# Patient Record
Sex: Female | Born: 1935 | Race: Black or African American | Hispanic: No | Smoking: Never smoker
Health system: Southern US, Community
[De-identification: ages and names within clinical notes are randomized; demographics above are authoritative.]

## PROBLEM LIST (undated history)

## (undated) DIAGNOSIS — D696 Thrombocytopenia, unspecified: Secondary | ICD-10-CM

## (undated) DIAGNOSIS — R1310 Dysphagia, unspecified: Secondary | ICD-10-CM

## (undated) DIAGNOSIS — R739 Hyperglycemia, unspecified: Secondary | ICD-10-CM

## (undated) DIAGNOSIS — Q2112 Patent foramen ovale: Secondary | ICD-10-CM

## (undated) DIAGNOSIS — E2839 Other primary ovarian failure: Secondary | ICD-10-CM

## (undated) DIAGNOSIS — E041 Nontoxic single thyroid nodule: Secondary | ICD-10-CM

## (undated) DIAGNOSIS — D649 Anemia, unspecified: Secondary | ICD-10-CM

## (undated) DIAGNOSIS — N183 Chronic kidney disease, stage 3 unspecified: Secondary | ICD-10-CM

## (undated) DIAGNOSIS — R32 Unspecified urinary incontinence: Secondary | ICD-10-CM

## (undated) DIAGNOSIS — M1990 Unspecified osteoarthritis, unspecified site: Secondary | ICD-10-CM

## (undated) DIAGNOSIS — Z0000 Encounter for general adult medical examination without abnormal findings: Secondary | ICD-10-CM

## (undated) DIAGNOSIS — H409 Unspecified glaucoma: Secondary | ICD-10-CM

## (undated) DIAGNOSIS — M79675 Pain in left toe(s): Secondary | ICD-10-CM

## (undated) DIAGNOSIS — N289 Disorder of kidney and ureter, unspecified: Secondary | ICD-10-CM

## (undated) DIAGNOSIS — G459 Transient cerebral ischemic attack, unspecified: Secondary | ICD-10-CM

## (undated) DIAGNOSIS — I10 Essential (primary) hypertension: Secondary | ICD-10-CM

## (undated) DIAGNOSIS — I480 Paroxysmal atrial fibrillation: Secondary | ICD-10-CM

## (undated) DIAGNOSIS — R296 Repeated falls: Secondary | ICD-10-CM

## (undated) DIAGNOSIS — N3281 Overactive bladder: Secondary | ICD-10-CM

## (undated) DIAGNOSIS — Z8619 Personal history of other infectious and parasitic diseases: Secondary | ICD-10-CM

## (undated) DIAGNOSIS — E785 Hyperlipidemia, unspecified: Secondary | ICD-10-CM

## (undated) DIAGNOSIS — I2510 Atherosclerotic heart disease of native coronary artery without angina pectoris: Secondary | ICD-10-CM

## (undated) DIAGNOSIS — Q211 Atrial septal defect: Secondary | ICD-10-CM

## (undated) HISTORY — DX: Atrial septal defect: Q21.1

## (undated) HISTORY — DX: Disorder of kidney and ureter, unspecified: N28.9

## (undated) HISTORY — DX: Unspecified osteoarthritis, unspecified site: M19.90

## (undated) HISTORY — DX: Hyperglycemia, unspecified: R73.9

## (undated) HISTORY — DX: Essential (primary) hypertension: I10

## (undated) HISTORY — DX: Unspecified urinary incontinence: R32

## (undated) HISTORY — PX: ABDOMINAL HYSTERECTOMY: SHX81

## (undated) HISTORY — DX: Transient cerebral ischemic attack, unspecified: G45.9

## (undated) HISTORY — DX: Personal history of other infectious and parasitic diseases: Z86.19

## (undated) HISTORY — PX: EYE SURGERY: SHX253

## (undated) HISTORY — DX: Nontoxic single thyroid nodule: E04.1

## (undated) HISTORY — DX: Patent foramen ovale: Q21.12

## (undated) HISTORY — PX: APPENDECTOMY: SHX54

## (undated) HISTORY — DX: Pain in left toe(s): M79.675

## (undated) HISTORY — DX: Hyperlipidemia, unspecified: E78.5

## (undated) HISTORY — DX: Encounter for general adult medical examination without abnormal findings: Z00.00

## (undated) HISTORY — PX: JOINT REPLACEMENT: SHX530

## (undated) HISTORY — DX: Other primary ovarian failure: E28.39

---

## 2005-05-17 HISTORY — PX: TRIGGER FINGER RELEASE: SHX641

## 2005-05-17 HISTORY — PX: REPLACEMENT TOTAL KNEE: SUR1224

## 2013-04-29 DIAGNOSIS — R55 Syncope and collapse: Secondary | ICD-10-CM | POA: Insufficient documentation

## 2014-02-19 ENCOUNTER — Ambulatory Visit (INDEPENDENT_AMBULATORY_CARE_PROVIDER_SITE_OTHER): Payer: Medicare Other | Admitting: Internal Medicine

## 2014-02-19 ENCOUNTER — Encounter: Payer: Self-pay | Admitting: Internal Medicine

## 2014-02-19 ENCOUNTER — Telehealth: Payer: Self-pay | Admitting: *Deleted

## 2014-02-19 ENCOUNTER — Encounter: Payer: Self-pay | Admitting: *Deleted

## 2014-02-19 VITALS — BP 150/70 | HR 63 | Temp 98.0°F | Resp 15 | Ht 60.0 in | Wt 139.0 lb

## 2014-02-19 DIAGNOSIS — R7989 Other specified abnormal findings of blood chemistry: Secondary | ICD-10-CM

## 2014-02-19 DIAGNOSIS — R748 Abnormal levels of other serum enzymes: Secondary | ICD-10-CM

## 2014-02-19 DIAGNOSIS — R32 Unspecified urinary incontinence: Secondary | ICD-10-CM | POA: Insufficient documentation

## 2014-02-19 DIAGNOSIS — Z23 Encounter for immunization: Secondary | ICD-10-CM

## 2014-02-19 DIAGNOSIS — M1990 Unspecified osteoarthritis, unspecified site: Secondary | ICD-10-CM | POA: Insufficient documentation

## 2014-02-19 DIAGNOSIS — M159 Polyosteoarthritis, unspecified: Secondary | ICD-10-CM

## 2014-02-19 DIAGNOSIS — M150 Primary generalized (osteo)arthritis: Secondary | ICD-10-CM

## 2014-02-19 DIAGNOSIS — D538 Other specified nutritional anemias: Secondary | ICD-10-CM

## 2014-02-19 DIAGNOSIS — I10 Essential (primary) hypertension: Secondary | ICD-10-CM

## 2014-02-19 DIAGNOSIS — E042 Nontoxic multinodular goiter: Secondary | ICD-10-CM

## 2014-02-19 DIAGNOSIS — E785 Hyperlipidemia, unspecified: Secondary | ICD-10-CM

## 2014-02-19 DIAGNOSIS — Z90710 Acquired absence of both cervix and uterus: Secondary | ICD-10-CM

## 2014-02-19 DIAGNOSIS — N3946 Mixed incontinence: Secondary | ICD-10-CM

## 2014-02-19 DIAGNOSIS — D649 Anemia, unspecified: Secondary | ICD-10-CM | POA: Insufficient documentation

## 2014-02-19 LAB — COMPLETE METABOLIC PANEL WITH GFR
ALBUMIN: 4.2 g/dL (ref 3.5–5.2)
ALT: 23 U/L (ref 0–35)
AST: 29 U/L (ref 0–37)
Alkaline Phosphatase: 59 U/L (ref 39–117)
BUN: 18 mg/dL (ref 6–23)
CALCIUM: 9.6 mg/dL (ref 8.4–10.5)
CHLORIDE: 102 meq/L (ref 96–112)
CO2: 21 mEq/L (ref 19–32)
Creat: 1.15 mg/dL — ABNORMAL HIGH (ref 0.50–1.10)
GFR, EST AFRICAN AMERICAN: 50 mL/min — AB
GFR, EST NON AFRICAN AMERICAN: 43 mL/min — AB
GLUCOSE: 93 mg/dL (ref 70–99)
POTASSIUM: 4.5 meq/L (ref 3.5–5.3)
Sodium: 137 mEq/L (ref 135–145)
Total Bilirubin: 0.6 mg/dL (ref 0.2–1.2)
Total Protein: 7.1 g/dL (ref 6.0–8.3)

## 2014-02-19 LAB — LIPID PANEL
CHOLESTEROL: 140 mg/dL (ref 0–200)
HDL: 81 mg/dL (ref >39)
LDL Cholesterol: 41 mg/dL (ref 0–99)
Total CHOL/HDL Ratio: 1.7 Ratio
Triglycerides: 91 mg/dL (ref <150)
VLDL: 18 mg/dL (ref 0–40)

## 2014-02-19 MED ORDER — CARVEDILOL 3.125 MG PO TABS: 3.1250 mg | ORAL_TABLET | Freq: Two times a day (BID) | ORAL | Status: DC

## 2014-02-19 NOTE — Progress Notes (Signed)
Subjective:    Patient ID: Holly Nunez, female    DOB: 1924/10/14, 80 y.o.   MRN: [id number]  HPI  Holly Nunez is a new pt here for first visit primary care  She has been in [place] for 4 months now from [place].   She lived there with with her son.  Holly Nunez grew up in [place].    She is now living with her daughter who is also a pt of mine  PMH of HTN, hyperlipidemia,   CAD,  DJD, thyroid nodule,  Elevated creatinine, urinary incontinence (formerly on Ditropan)    Mild anemia  She is S/P hysterectomy.  For benign reasons.   And S/P  R TKR  She does report she is nearly out of her BP pills See repeat BP.  She checks as outpt and tells her SBP is always normal   She had been on ditropan in the past for incontinence but is not now  .  She tells me she is "up several times at night"    No recent chest pain or pressure.   Pt tells me she had a nodule in her thyroid that was biopsied last year and pt told it was benign   Daughter Holly Nunez tells me she thinks pt may have had TIa's in past.  Daughter and pt not at all clear what her cardiac history is.   Allergies not on file No past medical history on file. No past surgical history on file. History   Social History  . Marital Status: N/A    Spouse Name: N/A    Number of Children: N/A  . Years of Education: N/A   Occupational History  . Not on file.   Social History Main Topics  . Smoking status: Not on file  . Smokeless tobacco: Not on file  . Alcohol Use: Not on file  . Drug Use: Not on file  . Sexual Activity: Not on file   Other Topics Concern  . Not on file   Social History Narrative  . No narrative on file   No family history on file. There are no active problems to display for this patient.  No current outpatient prescriptions on file prior to visit.   No current facility-administered medications on file prior to visit.      Review of Systems See HPI    Objective:   Physical Exam  Physical Exam   Nursing note and vitals reviewed.  Constitutional: She is oriented to person, place, and time. She appears well-developed and well-nourished.  HENT:  Head: Normocephalic and atraumatic.  Cardiovascular: Normal rate and regular rhythm. Exam reveals no gallop and no friction rub.  No murmur heard.  Pulmonary/Chest: Breath sounds normal. She has no wheezes. She has no rales.  Neurological: She is alert and oriented to person, place, and time.  Skin: Skin is warm and dry.  Psychiatric: She has a normal mood and affect. Her behavior is normal.         Assessment & Plan:  HTN:  Repeat BP much lower will RX coreg bid  See me in 2 weeks  Hyperlipidemia  Check today   Elevataed creatinine  Check today  MNG S/P benign bx per pt report   Check TSH today   CAD  Will need prior records   She wishes to establish with a new cardiologist here will refer to Dr. Stanford Breed  Mixed incontinence   Formerly on Ditropan but will need to check creatinine  before any RX  Advanced DJD  S/P Right TKR Flu vaccine given today   See me in two weeks

## 2014-02-19 NOTE — Patient Instructions (Signed)
See me 30 mins two weeks   To lab today

## 2014-02-19 NOTE — Telephone Encounter (Signed)
I spoke with Ms. Diffee's daughter and gave the appointment information for Dr. Stanford Breed. -eh

## 2014-02-20 LAB — CBC WITH DIFFERENTIAL/PLATELET
Basophils Absolute: 0.1 10*3/uL (ref 0.0–0.1)
Basophils Relative: 1 % (ref 0–1)
Eosinophils Absolute: 0.1 10*3/uL (ref 0.0–0.7)
Eosinophils Relative: 2 % (ref 0–5)
HEMATOCRIT: 36.2 % (ref 36.0–46.0)
Hemoglobin: 12.5 g/dL (ref 12.0–15.0)
LYMPHS PCT: 29 % (ref 12–46)
Lymphs Abs: 1.7 10*3/uL (ref 0.7–4.0)
MCH: 31.4 pg (ref 26.0–34.0)
MCHC: 34.5 g/dL (ref 30.0–36.0)
MCV: 91 fL (ref 78.0–100.0)
MONO ABS: 0.4 10*3/uL (ref 0.1–1.0)
Monocytes Relative: 6 % (ref 3–12)
NEUTROS ABS: 3.7 10*3/uL (ref 1.7–7.7)
Neutrophils Relative %: 62 % (ref 43–77)
Platelets: 209 10*3/uL (ref 150–400)
RBC: 3.98 MIL/uL (ref 3.87–5.11)
RDW: 14.4 % (ref 11.5–15.5)
WBC: 5.9 10*3/uL (ref 4.0–10.5)

## 2014-02-20 LAB — VITAMIN D 25 HYDROXY (VIT D DEFICIENCY, FRACTURES): Vit D, 25-Hydroxy: 35 ng/mL (ref 30–89)

## 2014-02-20 LAB — TSH: TSH: 0.771 u[IU]/mL (ref 0.350–4.500)

## 2014-02-25 NOTE — Progress Notes (Signed)
I spoke with Holly Nunez and her daughter about lab results. Advised that she keep her appointment with [place] next week. I also mailed her a copy of the labs-eh

## 2014-03-07 ENCOUNTER — Ambulatory Visit (INDEPENDENT_AMBULATORY_CARE_PROVIDER_SITE_OTHER): Payer: Medicare Other | Admitting: Internal Medicine

## 2014-03-07 ENCOUNTER — Encounter: Payer: Self-pay | Admitting: Internal Medicine

## 2014-03-07 VITALS — BP 148/70 | HR 68 | Temp 98.1°F | Resp 16 | Ht 60.0 in | Wt 138.0 lb

## 2014-03-07 DIAGNOSIS — N3946 Mixed incontinence: Secondary | ICD-10-CM

## 2014-03-07 DIAGNOSIS — I10 Essential (primary) hypertension: Secondary | ICD-10-CM

## 2014-03-07 DIAGNOSIS — N289 Disorder of kidney and ureter, unspecified: Secondary | ICD-10-CM | POA: Insufficient documentation

## 2014-03-07 DIAGNOSIS — E785 Hyperlipidemia, unspecified: Secondary | ICD-10-CM

## 2014-03-07 DIAGNOSIS — E042 Nontoxic multinodular goiter: Secondary | ICD-10-CM

## 2014-03-07 MED ORDER — OXYBUTYNIN CHLORIDE ER 5 MG PO TB24: ORAL_TABLET | ORAL | Status: DC

## 2014-03-07 NOTE — Progress Notes (Signed)
Subjective:    Patient ID: Holly Nunez, female    DOB: December 12, 1924, 80 y.o.   MRN: [id number]  HPI 01/2014 note HTN: Repeat BP much lower will RX coreg bid See me in 2 weeks  Hyperlipidemia Check today  Elevataed creatinine Check today  MNG S/P benign bx per pt report Check TSH today  CAD Will need prior records She wishes to establish with a new cardiologist here will refer to Dr. Stanford Breed  Mixed incontinence Formerly on Ditropan but will need to check creatinine before any RX  Advanced DJD S/P Right TKR  Flu vaccine given today  See m e in two weeks  Today  Armina is here with her daughter who works for [employer].    Renal insufficiency  See current labs .  She is not on any diuretics at this time.  Risk factor long standing HTN   I do not have prior values  She has mixed incontinence and would like the Ditropan she had been on in the past.   Daughter reports pt has fallen in the past and may benefit from Home pt and pt is homebound.  .  She currently ambulates with a walker    Not on File Past Medical History  Diagnosis Date  . Arthritis   . Hypertension   . Hyperlipidemia   . Thyroid disease   . Urinary incontinence   . Heart disease    Past Surgical History  Procedure Laterality Date  . Abdominal hysterectomy    . Joint replacement    . Replacement total knee Right 2007  . Trigger finger release  2007   History   Social History  . Marital Status: Single    Spouse Name: N/A    Number of Children: N/A  . Years of Education: N/A   Occupational History  . Not on file.   Social History Main Topics  . Smoking status: Never Smoker   . Smokeless tobacco: Never Used  . Alcohol Use: No  . Drug Use: No  . Sexual Activity: Not Currently    Partners: Male   Other Topics Concern  . Not on file   Social History Narrative  . No narrative on file   Family History  Problem Relation Age of Onset  . Heart disease Mother   . Kidney disease Sister    . Cancer Sister   . Bone cancer Sister   . Pancreatic cancer Daughter    Patient Active Problem List   Diagnosis Date Noted  . HTN (hypertension) 02/19/2014  . Hyperlipidemia 02/19/2014  . DJD (degenerative joint disease) 02/19/2014  . S/P hysterectomy 02/19/2014  . Multinodular goiter 02/19/2014  . Elevated serum creatinine 02/19/2014  . Anemia 02/19/2014  . Urinary incontinence 02/19/2014   Current Outpatient Prescriptions on File Prior to Visit  Medication Sig Dispense Refill  . apixaban (ELIQUIS) 2.5 MG TABS tablet Take 2.5 mg by mouth 2 (two) times daily.      Marland Kitchen atorvastatin (LIPITOR) 40 MG tablet Take 40 mg by mouth daily.      . brimonidine (ALPHAGAN) 0.15 % ophthalmic solution Place 1 drop into both eyes 3 (three) times daily.      . carvedilol (COREG) 3.125 MG tablet Take 1 tablet (3.125 mg total) by mouth 2 (two) times daily with a meal.  60 tablet  1  . latanoprost (XALATAN) 0.005 % ophthalmic solution Place 1 drop into both eyes at bedtime.      . nitroGLYCERIN (NITROLINGUAL)  0.4 MG/SPRAY spray Place 1 spray under the tongue every 5 (five) minutes x 3 doses as needed for chest pain.       No current facility-administered medications on file prior to visit.       Review of Systems    see HPI Objective:   Physical Exam  Physical Exam  Nursing note and vitals reviewed.  Constitutional: She is oriented to person, place, and time. She appears well-developed and well-nourished.  HENT:  Head: Normocephalic and atraumatic.  Cardiovascular: Normal rate and regular rhythm. Exam reveals no gallop and no friction rub.  No murmur heard.  Pulmonary/Chest: Breath sounds normal. She has no wheezes. She has no rales.  Neurological: She is alert and oriented to person, place, and time.  Skin: Skin is warm and dry.  Psychiatric: She has a normal mood and affect. Her behavior is normal.        Assessment & Plan:  HTN:  Continue current meds.  She does have an element of  white coat syndrome  Renal insufficiency.  Will watch closely.  Awaiting old records for comparison.   Mixed urinary incontinence  Pt and daughter give no history of glaucoma.  She had been on Ditropan bid in past.  Ok to give Ditropan just once a day   MNG  TSH NOrmal  Prior falls  Will have home asessment with [facility].   PT to do home visits. ADvised grab bars and pt to use walker at all times.   Hyperlipidemia  Continue Lipitor  lfts good  CAD  She has upcoming appt with Dr. Stanford Breed    Follow up with me  In December

## 2014-03-13 ENCOUNTER — Other Ambulatory Visit: Payer: Self-pay | Admitting: *Deleted

## 2014-03-13 NOTE — Telephone Encounter (Signed)
Patient is requesting a one time refill til she sees Dr. Stanford Breed. -eh

## 2014-03-14 MED ORDER — APIXABAN 2.5 MG PO TABS: 2.5000 mg | ORAL_TABLET | Freq: Two times a day (BID) | ORAL | Status: DC

## 2014-03-14 NOTE — Telephone Encounter (Signed)
[name] Daughter [phone]  [name] called back to see if we were going to be able to fill [name] apixaban (ELIQUIS) 2.5 MG TABS tablet, she needs at least 5 pills to get her thru until she sees Dr Stanford Breed on 03/20/14

## 2014-03-14 NOTE — Telephone Encounter (Signed)
Holly Nunez  Call pt and let her know that I will refill her Eliquis for one month .   She will meet Dr. Stanford Breed on 11/4 and the cardiologist will need to refill this med moving forward from there

## 2014-03-14 NOTE — Telephone Encounter (Signed)
I let Holly Nunez know that we called in her Eliquis this one time to hold her till she gets in with the cardiologist. -eh

## 2014-03-20 ENCOUNTER — Ambulatory Visit (INDEPENDENT_AMBULATORY_CARE_PROVIDER_SITE_OTHER): Payer: Medicare Other | Admitting: Cardiology

## 2014-03-20 ENCOUNTER — Encounter: Payer: Self-pay | Admitting: Cardiology

## 2014-03-20 VITALS — BP 156/72 | HR 64 | Ht 61.0 in | Wt 143.0 lb

## 2014-03-20 DIAGNOSIS — G459 Transient cerebral ischemic attack, unspecified: Secondary | ICD-10-CM | POA: Insufficient documentation

## 2014-03-20 DIAGNOSIS — I10 Essential (primary) hypertension: Secondary | ICD-10-CM

## 2014-03-20 DIAGNOSIS — E785 Hyperlipidemia, unspecified: Secondary | ICD-10-CM

## 2014-03-20 DIAGNOSIS — G458 Other transient cerebral ischemic attacks and related syndromes: Secondary | ICD-10-CM

## 2014-03-20 NOTE — Assessment & Plan Note (Signed)
Patient has a history of patent foramen ovale with TIAs by her report. She is presently on apixaban. I will continue for now. We will obtain all previous records for review.

## 2014-03-20 NOTE — Assessment & Plan Note (Signed)
Continue statin. Lipids and liver monitored by primary care. 

## 2014-03-20 NOTE — Patient Instructions (Signed)
Your physician recommends that you schedule a follow-up appointment in: [place]

## 2014-03-20 NOTE — Progress Notes (Signed)
HPI: 80 year old female for evaluation of patent foramen ovale and prior TIA. Patient previously lived in [place]. I do not have her previous medical records available. She does have a paper saying that she has a patent foramen ovale. She has a history of TIAs but no other cardiac history. She has been treated with apixaban. She denies dyspnea, chest pain, syncope or bleeding. Occasional brief flutters but no sustained palpitations. She does not recall the term atrial fibrillation.  Current Outpatient Prescriptions  Medication Sig Dispense Refill  . apixaban (ELIQUIS) 2.5 MG TABS tablet Take 1 tablet (2.5 mg total) by mouth 2 (two) times daily. 60 tablet 0  . atorvastatin (LIPITOR) 40 MG tablet Take 40 mg by mouth daily.    . brimonidine (ALPHAGAN) 0.15 % ophthalmic solution Place 1 drop into both eyes 3 (three) times daily.    . carvedilol (COREG) 3.125 MG tablet Take 1 tablet (3.125 mg total) by mouth 2 (two) times daily with a meal. 60 tablet 1  . latanoprost (XALATAN) 0.005 % ophthalmic solution Place 1 drop into both eyes at bedtime.    . nitroGLYCERIN (NITROLINGUAL) 0.4 MG/SPRAY spray Place 1 spray under the tongue every 5 (five) minutes x 3 doses as needed for chest pain.    Marland Kitchen oxybutynin (DITROPAN XL) 5 MG 24 hr tablet Take one daily 30 tablet 1   No current facility-administered medications for this visit.    No Known Allergies   Past Medical History  Diagnosis Date  . Arthritis   . Hypertension   . Hyperlipidemia   . Thyroid nodule     Biopsy-negative  . Urinary incontinence   . Renal insufficiency   . Patent foramen ovale   . TIA (transient ischemic attack)     Past Surgical History  Procedure Laterality Date  . Abdominal hysterectomy    . Joint replacement    . Replacement total knee Right 2007  . Trigger finger release  2007  . Appendectomy      History   Social History  . Marital Status: Single    Spouse Name: N/A    Number of Children: 4  . Years  of Education: N/A   Occupational History  . Not on file.   Social History Main Topics  . Smoking status: Never Smoker   . Smokeless tobacco: Never Used  . Alcohol Use: No  . Drug Use: No  . Sexual Activity:    Partners: Male   Other Topics Concern  . Not on file   Social History Narrative    Family History  Problem Relation Age of Onset  . Heart disease Mother   . Kidney disease Sister   . Cancer Sister   . Bone cancer Sister   . Pancreatic cancer Daughter     ROS: no fevers or chills, productive cough, hemoptysis, dysphasia, odynophagia, melena, hematochezia, dysuria, hematuria, rash, seizure activity, orthopnea, PND, pedal edema, claudication. Remaining systems are negative.  Physical Exam:   Blood pressure 156/72, pulse 64, height 5\' 1"  (1.549 m), weight 143 lb (64.864 kg).  General:  Well developed/well nourished in NAD Skin warm/dry Patient not depressed No peripheral clubbing Back-normal HEENT-normal/normal eyelids Neck supple/normal carotid upstroke bilaterally; no bruits; no JVD; no thyromegaly chest - CTA/ normal expansion CV - RRR/normal S1 and S2; no murmurs, rubs or gallops;  PMI nondisplaced Abdomen -NT/ND, no HSM, no mass, + bowel sounds, no bruit 2+ femoral pulses, no bruits Ext-no edema, chords, 2+ DP Neuro-grossly nonfocal  ECG Sinus rhythm at a rate of 61. No ST changes.

## 2014-03-20 NOTE — Assessment & Plan Note (Signed)
Blood pressure is mildly elevated. I have asked her to follow this and we will increase medications as needed. She states her systolic typically runs in the 120 range.

## 2014-03-21 ENCOUNTER — Telehealth: Payer: Self-pay | Admitting: Cardiology

## 2014-03-21 NOTE — Telephone Encounter (Signed)
Release on [facility] (Dr Stanford Breed) faxed to Dr Cherrie Distance for all records.  Faxed 03/21/14 lp

## 2014-03-25 ENCOUNTER — Telehealth: Payer: Self-pay

## 2014-03-25 NOTE — Telephone Encounter (Signed)
I spoke with Patient, she does not have any CV SX. We will see her tomorrow in the office. She was advised to double her BP medication tomorrow per Dr .Shirline Frees

## 2014-03-25 NOTE — Telephone Encounter (Signed)
Holly Nunez Daughter  Adryan called to say that mom's blood pressure has been running high, the other daughter that is a nurse started taking it on Friday  Friday  6:30am  174/80         8:15pm   154/70 Sat 9:00am   170/90 Sun 8:00am 180/90 Mon 6:45am 180/88  [name] cell [phone]

## 2014-03-25 NOTE — Telephone Encounter (Signed)
Margaretha Sheffield call to check on pt.  She is on schedule to see me tommorow.  ADvise her to take an extra Coreg with her next dose .  Any headache or CVA symptoms go to ER

## 2014-03-26 ENCOUNTER — Encounter: Payer: Self-pay | Admitting: Internal Medicine

## 2014-03-26 ENCOUNTER — Ambulatory Visit (INDEPENDENT_AMBULATORY_CARE_PROVIDER_SITE_OTHER): Payer: Medicare Other | Admitting: Internal Medicine

## 2014-03-26 VITALS — BP 182/74 | HR 65 | Temp 98.1°F | Resp 16 | Wt 141.0 lb

## 2014-03-26 DIAGNOSIS — M159 Polyosteoarthritis, unspecified: Secondary | ICD-10-CM

## 2014-03-26 DIAGNOSIS — I10 Essential (primary) hypertension: Secondary | ICD-10-CM

## 2014-03-26 DIAGNOSIS — M150 Primary generalized (osteo)arthritis: Secondary | ICD-10-CM | POA: Diagnosis not present

## 2014-03-26 DIAGNOSIS — G459 Transient cerebral ischemic attack, unspecified: Secondary | ICD-10-CM | POA: Diagnosis not present

## 2014-03-26 MED ORDER — CARVEDILOL 3.125 MG PO TABS: ORAL_TABLET | ORAL | Status: DC

## 2014-03-26 NOTE — Progress Notes (Signed)
Subjective:    Patient ID: Holly Nunez, female    DOB: May 29, 1924, 80 y.o.   MRN: [id number]  HPI 03/20/2014 cardiology note HTN (hypertension) - Lelon Perla, MD at 03/20/2014 11:33 AM     Status: Written Related Problem: HTN (hypertension)   Expand All Collapse All   Blood pressure is mildly elevated. I have asked her to follow this and we will increase medications as needed. She states her systolic typically runs in the 120 range.            Hyperlipidemia - Lelon Perla, MD at 03/20/2014 11:34 AM     Status: Written Related Problem: Hyperlipidemia   Expand All Collapse All   Continue statin.Lipids and liver monitored by primary care.            TIA (transient ischemic attack) - Lelon Perla, MD at 03/20/2014 11:35 AM     Status: Written Related Problem: TIA (transient ischemic attack)   Expand All Collapse All   Patient has a history of patent foramen ovale with TIAs by her report. She is presently on apixaban. I will continue for now. We will obtain all previous records for review.             Today's note  Holly Nunez is here with her daughter .  BP running high at home  SBP 170-190    Pt completey asymptomatic   No chest pain no SOB   She does have pain in both knees from DJD  She is S/P  R TKR with revision.  She uses two Tylenol ES  Bid    No Known Allergies Past Medical History  Diagnosis Date  . Arthritis   . Hypertension   . Hyperlipidemia   . Thyroid nodule     Biopsy-negative  . Urinary incontinence   . Renal insufficiency   . Patent foramen ovale   . TIA (transient ischemic attack)    Past Surgical History  Procedure Laterality Date  . Abdominal hysterectomy    . Joint replacement    . Replacement total knee Right 2007  . Trigger finger release  2007  . Appendectomy     History   Social History  . Marital Status: Single    Spouse Name: N/A    Number of Children: 4  . Years of Education: N/A   Occupational  History  . Not on file.   Social History Main Topics  . Smoking status: Never Smoker   . Smokeless tobacco: Never Used  . Alcohol Use: No  . Drug Use: No  . Sexual Activity:    Partners: Male   Other Topics Concern  . Not on file   Social History Narrative   Family History  Problem Relation Age of Onset  . Heart disease Mother   . Kidney disease Sister   . Cancer Sister   . Bone cancer Sister   . Pancreatic cancer Daughter    Patient Active Problem List   Diagnosis Date Noted  . TIA (transient ischemic attack) 03/20/2014  . Renal insufficiency 03/07/2014  . HTN (hypertension) 02/19/2014  . Hyperlipidemia 02/19/2014  . DJD (degenerative joint disease) 02/19/2014  . S/P hysterectomy 02/19/2014  . Multinodular goiter 02/19/2014  . Elevated serum creatinine 02/19/2014  . Anemia 02/19/2014  . Urinary incontinence 02/19/2014   Current Outpatient Prescriptions on File Prior to Visit  Medication Sig Dispense Refill  . apixaban (ELIQUIS) 2.5 MG TABS tablet Take 1 tablet (2.5 mg total) by  mouth 2 (two) times daily. 60 tablet 0  . atorvastatin (LIPITOR) 40 MG tablet Take 40 mg by mouth daily.    . brimonidine (ALPHAGAN) 0.15 % ophthalmic solution Place 1 drop into both eyes 3 (three) times daily.    Marland Kitchen latanoprost (XALATAN) 0.005 % ophthalmic solution Place 1 drop into both eyes at bedtime.    . nitroGLYCERIN (NITROLINGUAL) 0.4 MG/SPRAY spray Place 1 spray under the tongue every 5 (five) minutes x 3 doses as needed for chest pain.    Marland Kitchen oxybutynin (DITROPAN XL) 5 MG 24 hr tablet Take one daily 30 tablet 1   No current facility-administered medications on file prior to visit.       Review of Systems    see HPI Objective:   Physical Exam  Physical Exam  Nursing note and vitals reviewed.   Repeat BP  162/80 R arm.  L arm 164/82 Constitutional: She is oriented to person, place, and time. She appears well-developed and well-nourished.  HENT:  Head: Normocephalic and  atraumatic.  Cardiovascular: Normal rate and regular rhythm. Exam reveals no gallop and no friction rub.  No murmur heard.  Pulmonary/Chest: Breath sounds normal. She has no wheezes. She has no rales.  Neurological: She is alert and oriented to person, place, and time.  Skin: Skin is warm and dry.  Psychiatric: She has a normal mood and affect. Her behavior is normal.         Assessment & Plan:  HTN:  Will increase Coreg 3.125 to two tabs bid.    Advanced  DJD :  Pt does not wish any change in meds or referral at this time.       PFO /TIA  :  Pending anticoagulant choice per cardiology   See me in 2 weeks or sooner prn

## 2014-03-26 NOTE — Patient Instructions (Signed)
See me on WEds 11/25

## 2014-04-09 ENCOUNTER — Telehealth: Payer: Self-pay | Admitting: Cardiology

## 2014-04-09 DIAGNOSIS — N289 Disorder of kidney and ureter, unspecified: Secondary | ICD-10-CM

## 2014-04-09 MED ORDER — RIVAROXABAN 15 MG PO TABS: 15.0000 mg | ORAL_TABLET | Freq: Two times a day (BID) | ORAL | Status: DC

## 2014-04-09 NOTE — Telephone Encounter (Signed)
Spoke with pt dtr, new script sent to the pharm. Lab orders mailed to patient for blood to be drawn in [place].

## 2014-04-09 NOTE — Telephone Encounter (Signed)
Need records from [facility], dc apixaban, xarelto 15 mg daily, cbc and bmet 4 weeks Holly Nunez

## 2014-04-09 NOTE — Telephone Encounter (Signed)
Spoke with pt dtr, patients insurance will cover xarelto not eliquis, she would like to switch. Will forward for dr Stanford Breed review for dosing

## 2014-04-09 NOTE — Telephone Encounter (Signed)
Please call,concerning her Eliquis and her Xarelto.

## 2014-04-10 ENCOUNTER — Encounter: Payer: Self-pay | Admitting: Internal Medicine

## 2014-04-10 ENCOUNTER — Other Ambulatory Visit: Payer: Self-pay | Admitting: *Deleted

## 2014-04-10 ENCOUNTER — Ambulatory Visit (INDEPENDENT_AMBULATORY_CARE_PROVIDER_SITE_OTHER): Payer: Medicare Other | Admitting: Internal Medicine

## 2014-04-10 VITALS — BP 177/71 | HR 59 | Temp 98.3°F | Resp 16 | Ht 60.0 in | Wt 145.0 lb

## 2014-04-10 DIAGNOSIS — I10 Essential (primary) hypertension: Secondary | ICD-10-CM

## 2014-04-10 MED ORDER — TRIAMTERENE-HCTZ 37.5-25 MG PO TABS: ORAL_TABLET | ORAL | Status: DC

## 2014-04-10 MED ORDER — RIVAROXABAN 15 MG PO TABS: 15.0000 mg | ORAL_TABLET | Freq: Every day | ORAL | Status: DC

## 2014-04-10 NOTE — Patient Instructions (Signed)
Take 1/2 of Maxzide diuretic daily   See me in early January  Call if needed

## 2014-04-10 NOTE — Progress Notes (Signed)
Subjective:    Patient ID: Holly Nunez, female    DOB: May 15, 1925, 80 y.o.   MRN: [id number]  HPI  03/26/2014 note HTN: Will increase Coreg 3.125 to two tabs bid.   Advanced DJD : Pt does not wish any change in meds or referral at this time.   PFO /TIA : Pending anticoagulant choice per cardiology   See me in 2 weeks or sooner prn          TODAY:  Hector has increased her Coreg to 3.125  Two bid.  Tolerating fine  SBP at home 160-170.  Pt admits to eating a lot of salt and pork.  Daughther confirms she is salting food even before tasting     No Known Allergies Past Medical History  Diagnosis Date  . Arthritis   . Hypertension   . Hyperlipidemia   . Thyroid nodule     Biopsy-negative  . Urinary incontinence   . Renal insufficiency   . Patent foramen ovale   . TIA (transient ischemic attack)    Past Surgical History  Procedure Laterality Date  . Abdominal hysterectomy    . Joint replacement    . Replacement total knee Right 2007  . Trigger finger release  2007  . Appendectomy     History   Social History  . Marital Status: Single    Spouse Name: N/A    Number of Children: 4  . Years of Education: N/A   Occupational History  . Not on file.   Social History Main Topics  . Smoking status: Never Smoker   . Smokeless tobacco: Never Used  . Alcohol Use: No  . Drug Use: No  . Sexual Activity:    Partners: Male   Other Topics Concern  . Not on file   Social History Narrative   Family History  Problem Relation Age of Onset  . Heart disease Mother   . Kidney disease Sister   . Cancer Sister   . Bone cancer Sister   . Pancreatic cancer Daughter    Patient Active Problem List   Diagnosis Date Noted  . TIA (transient ischemic attack) 03/20/2014  . Renal insufficiency 03/07/2014  . HTN (hypertension) 02/19/2014  . Hyperlipidemia 02/19/2014  . DJD (degenerative joint disease) 02/19/2014  . S/P hysterectomy 02/19/2014  .  Multinodular goiter 02/19/2014  . Elevated serum creatinine 02/19/2014  . Anemia 02/19/2014  . Urinary incontinence 02/19/2014   Current Outpatient Prescriptions on File Prior to Visit  Medication Sig Dispense Refill  . atorvastatin (LIPITOR) 40 MG tablet Take 40 mg by mouth daily.    . brimonidine (ALPHAGAN) 0.15 % ophthalmic solution Place 1 drop into both eyes 3 (three) times daily.    . carvedilol (COREG) 3.125 MG tablet Take 2 tablets bid 120 tablet 1  . latanoprost (XALATAN) 0.005 % ophthalmic solution Place 1 drop into both eyes at bedtime.    . nitroGLYCERIN (NITROLINGUAL) 0.4 MG/SPRAY spray Place 1 spray under the tongue every 5 (five) minutes x 3 doses as needed for chest pain.    Marland Kitchen oxybutynin (DITROPAN XL) 5 MG 24 hr tablet Take one daily 30 tablet 1  . Rivaroxaban (XARELTO) 15 MG TABS tablet Take 1 tablet (15 mg total) by mouth 2 (two) times daily with a meal. 30 tablet 6   No current facility-administered medications on file prior to visit.      Review of Systems    see HPI Objective:   Physical Exam Physical Exam  Nursing note and vitals reviewed.   Repeat Bp 168/70 Constitutional: She is oriented to person, place, and time. She appears well-developed and well-nourished.  HENT:  Head: Normocephalic and atraumatic.  Cardiovascular: Normal rate and regular rhythm. Exam reveals no gallop and no friction rub.  No murmur heard.  Pulmonary/Chest: Breath sounds normal. She has no wheezes. She has no rales.  Neurological: She is alert and oriented to person, place, and time.  Skin: Skin is warm and dry.  Psychiatric: She has a normal mood and affect. Her behavior is normal.              Assessment & Plan:  ISH  Not at goal  Advised to get [name] salt substitue.  Will add low dose Maxzide  37.5/25   1/2 tablet daily   See me in January

## 2014-04-16 ENCOUNTER — Other Ambulatory Visit: Payer: Self-pay | Admitting: *Deleted

## 2014-04-16 MED ORDER — OXYBUTYNIN CHLORIDE ER 5 MG PO TB24: ORAL_TABLET | ORAL | Status: DC

## 2014-04-16 NOTE — Telephone Encounter (Signed)
Refill request

## 2014-04-17 ENCOUNTER — Ambulatory Visit: Payer: Medicare Other | Admitting: Internal Medicine

## 2014-04-17 ENCOUNTER — Other Ambulatory Visit: Payer: Self-pay | Admitting: *Deleted

## 2014-04-17 MED ORDER — OXYBUTYNIN CHLORIDE ER 5 MG PO TB24: ORAL_TABLET | ORAL | Status: DC

## 2014-04-23 ENCOUNTER — Ambulatory Visit (INDEPENDENT_AMBULATORY_CARE_PROVIDER_SITE_OTHER): Payer: Medicare Other | Admitting: Internal Medicine

## 2014-04-23 ENCOUNTER — Encounter: Payer: Self-pay | Admitting: Internal Medicine

## 2014-04-23 VITALS — BP 146/70 | HR 63 | Resp 16 | Ht 61.0 in | Wt 142.0 lb

## 2014-04-23 DIAGNOSIS — R609 Edema, unspecified: Secondary | ICD-10-CM

## 2014-04-23 DIAGNOSIS — I10 Essential (primary) hypertension: Secondary | ICD-10-CM

## 2014-04-23 NOTE — Progress Notes (Signed)
Subjective:    Patient ID: Holly Nunez, female    DOB: 1924/11/15, 80 y.o.   MRN: [id number]  HPI 11/25 note ISH Not at goal Advised to get [name] salt substitue. Will add low dose Maxzide 37.5/25 1/2 tablet daily   See me in January   TODAY:  Holly Nunez returns with her daughter after initiating maxzide.  Tolerating well SBP at home 140-150.    LE edema resolving   She has decreased her salt intake quite a bit.   No Known Allergies Past Medical History  Diagnosis Date  . Arthritis   . Hypertension   . Hyperlipidemia   . Thyroid nodule     Biopsy-negative  . Urinary incontinence   . Renal insufficiency   . Patent foramen ovale   . TIA (transient ischemic attack)    Past Surgical History  Procedure Laterality Date  . Abdominal hysterectomy    . Joint replacement    . Replacement total knee Right 2007  . Trigger finger release  2007  . Appendectomy     History   Social History  . Marital Status: Single    Spouse Name: N/A    Number of Children: 4  . Years of Education: N/A   Occupational History  . Not on file.   Social History Main Topics  . Smoking status: Never Smoker   . Smokeless tobacco: Never Used  . Alcohol Use: No  . Drug Use: No  . Sexual Activity:    Partners: Male   Other Topics Concern  . Not on file   Social History Narrative   Family History  Problem Relation Age of Onset  . Heart disease Mother   . Kidney disease Sister   . Cancer Sister   . Bone cancer Sister   . Pancreatic cancer Daughter    Patient Active Problem List   Diagnosis Date Noted  . TIA (transient ischemic attack) 03/20/2014  . Renal insufficiency 03/07/2014  . HTN (hypertension) 02/19/2014  . Hyperlipidemia 02/19/2014  . DJD (degenerative joint disease) 02/19/2014  . S/P hysterectomy 02/19/2014  . Multinodular goiter 02/19/2014  . Elevated serum creatinine 02/19/2014  . Anemia 02/19/2014  . Urinary incontinence 02/19/2014   Current Outpatient  Prescriptions on File Prior to Visit  Medication Sig Dispense Refill  . atorvastatin (LIPITOR) 40 MG tablet Take 40 mg by mouth daily.    . brimonidine (ALPHAGAN) 0.15 % ophthalmic solution Place 1 drop into both eyes 3 (three) times daily.    . carvedilol (COREG) 3.125 MG tablet Take 2 tablets bid 120 tablet 1  . latanoprost (XALATAN) 0.005 % ophthalmic solution Place 1 drop into both eyes at bedtime.    . nitroGLYCERIN (NITROLINGUAL) 0.4 MG/SPRAY spray Place 1 spray under the tongue every 5 (five) minutes x 3 doses as needed for chest pain.    Marland Kitchen oxybutynin (DITROPAN XL) 5 MG 24 hr tablet Take one daily 30 tablet 2  . Rivaroxaban (XARELTO) 15 MG TABS tablet Take 1 tablet (15 mg total) by mouth daily with supper. 30 tablet 6  . triamterene-hydrochlorothiazide (MAXZIDE-25) 37.5-25 MG per tablet Take one every am 30 tablet 1   No current facility-administered medications on file prior to visit.       Review of Systems    see HPI Objective:   Physical Exam Physical Exam  Nursing note and vitals reviewed. See repeat bp Constitutional: She is oriented to person, place, and time. She appears well-developed and well-nourished.  HENT:  Head:  Normocephalic and atraumatic.  Cardiovascular: Normal rate and regular rhythm. Exam reveals no gallop and no friction rub.  No murmur heard.  Pulmonary/Chest: Breath sounds normal. She has no wheezes. She has no rales.  Neurological: She is alert and oriented to person, place, and time.  Skin: Skin is warm and dry.  Psychiatric: She has a normal mood and affect. Her behavior is normal.          Assessment & Plan:  HTN will continue Coreg and maxzide  Check bmp today . At advanced age I do not want to be overly aggressive   And risk falls  LE edema improving  See me in January

## 2014-04-24 LAB — BASIC METABOLIC PANEL
BUN: 21 mg/dL (ref 6–23)
CALCIUM: 9.9 mg/dL (ref 8.4–10.5)
CO2: 24 mEq/L (ref 19–32)
Chloride: 101 mEq/L (ref 96–112)
Creat: 1.07 mg/dL (ref 0.50–1.10)
Glucose, Bld: 114 mg/dL — ABNORMAL HIGH (ref 70–99)
POTASSIUM: 4.5 meq/L (ref 3.5–5.3)
SODIUM: 139 meq/L (ref 135–145)

## 2014-04-29 ENCOUNTER — Telehealth: Payer: Self-pay | Admitting: Cardiology

## 2014-04-29 NOTE — Telephone Encounter (Signed)
Received records from Dr Cherrie Distance requested by Dr Stanford Breed.

## 2014-05-20 ENCOUNTER — Other Ambulatory Visit: Payer: Self-pay | Admitting: *Deleted

## 2014-05-20 DIAGNOSIS — N289 Disorder of kidney and ureter, unspecified: Secondary | ICD-10-CM | POA: Diagnosis not present

## 2014-05-20 LAB — BASIC METABOLIC PANEL WITH GFR
BUN: 28 mg/dL — AB (ref 6–23)
CO2: 25 mEq/L (ref 19–32)
CREATININE: 1.27 mg/dL — AB (ref 0.50–1.10)
Calcium: 9.8 mg/dL (ref 8.4–10.5)
Chloride: 103 mEq/L (ref 96–112)
GFR, EST AFRICAN AMERICAN: 44 mL/min — AB
GFR, EST NON AFRICAN AMERICAN: 38 mL/min — AB
GLUCOSE: 95 mg/dL (ref 70–99)
Potassium: 4.6 mEq/L (ref 3.5–5.3)
Sodium: 138 mEq/L (ref 135–145)

## 2014-05-20 LAB — CBC
HEMATOCRIT: 35.1 % — AB (ref 36.0–46.0)
Hemoglobin: 11.5 g/dL — ABNORMAL LOW (ref 12.0–15.0)
MCH: 30.7 pg (ref 26.0–34.0)
MCHC: 32.8 g/dL (ref 30.0–36.0)
MCV: 93.6 fL (ref 78.0–100.0)
MPV: 9.4 fL (ref 9.4–12.4)
Platelets: 207 10*3/uL (ref 150–400)
RBC: 3.75 MIL/uL — ABNORMAL LOW (ref 3.87–5.11)
RDW: 13.7 % (ref 11.5–15.5)
WBC: 6.2 10*3/uL (ref 4.0–10.5)

## 2014-05-20 MED ORDER — CARVEDILOL 3.125 MG PO TABS: ORAL_TABLET | ORAL | Status: DC

## 2014-05-27 ENCOUNTER — Other Ambulatory Visit: Payer: Self-pay | Admitting: *Deleted

## 2014-05-27 DIAGNOSIS — G459 Transient cerebral ischemic attack, unspecified: Secondary | ICD-10-CM

## 2014-05-27 DIAGNOSIS — R269 Unspecified abnormalities of gait and mobility: Secondary | ICD-10-CM

## 2014-05-29 ENCOUNTER — Ambulatory Visit (INDEPENDENT_AMBULATORY_CARE_PROVIDER_SITE_OTHER): Payer: Medicare Other | Admitting: Cardiology

## 2014-05-29 ENCOUNTER — Encounter: Payer: Self-pay | Admitting: Cardiology

## 2014-05-29 VITALS — BP 126/72 | HR 68 | Ht 61.0 in | Wt 142.0 lb

## 2014-05-29 DIAGNOSIS — G458 Other transient cerebral ischemic attacks and related syndromes: Secondary | ICD-10-CM | POA: Diagnosis not present

## 2014-05-29 DIAGNOSIS — I10 Essential (primary) hypertension: Secondary | ICD-10-CM

## 2014-05-29 DIAGNOSIS — E785 Hyperlipidemia, unspecified: Secondary | ICD-10-CM | POA: Diagnosis not present

## 2014-05-29 MED ORDER — RIVAROXABAN 15 MG PO TABS: 15.0000 mg | ORAL_TABLET | Freq: Every day | ORAL | Status: DC

## 2014-05-29 NOTE — Assessment & Plan Note (Signed)
Blood pressure controlled. Continue present medications. 

## 2014-05-29 NOTE — Progress Notes (Signed)
      HPI: FU hypertension, patent foramen ovale and prior TIA. Patient previously lived in [place]. Echocardiogram in November 2014 showed normal LV function, mild to moderate left ventricular hypertrophy, grade 2 diastolic dysfunction, aneurysmal atrial septum with PFO. There was moderate atherosclerosis in the aortic arch. She has a history of TIAs but no other cardiac history. She has been treated with apixaban. Since last seen, there is no dyspnea, chest pain, palpitations or syncope.  Current Outpatient Prescriptions  Medication Sig Dispense Refill  . atorvastatin (LIPITOR) 40 MG tablet Take 40 mg by mouth daily.    . brimonidine (ALPHAGAN) 0.15 % ophthalmic solution Place 1 drop into both eyes 3 (three) times daily.    . carvedilol (COREG) 3.125 MG tablet Take 2 tablets bid 120 tablet 1  . latanoprost (XALATAN) 0.005 % ophthalmic solution Place 1 drop into both eyes at bedtime.    . nitroGLYCERIN (NITROLINGUAL) 0.4 MG/SPRAY spray Place 1 spray under the tongue every 5 (five) minutes x 3 doses as needed for chest pain.    Marland Kitchen oxybutynin (DITROPAN XL) 5 MG 24 hr tablet Take one daily 30 tablet 2  . Rivaroxaban (XARELTO) 15 MG TABS tablet Take 1 tablet (15 mg total) by mouth daily with supper. 30 tablet 6  . triamterene-hydrochlorothiazide (MAXZIDE-25) 37.5-25 MG per tablet Take one every am 30 tablet 1   No current facility-administered medications for this visit.     Past Medical History  Diagnosis Date  . Arthritis   . Hypertension   . Hyperlipidemia   . Thyroid nodule     Biopsy-negative  . Urinary incontinence   . Renal insufficiency   . Patent foramen ovale   . TIA (transient ischemic attack)     Past Surgical History  Procedure Laterality Date  . Abdominal hysterectomy    . Joint replacement    . Replacement total knee Right 2007  . Trigger finger release  2007  . Appendectomy      History   Social History  . Marital Status: Single    Spouse Name: N/A   Number of Children: 4  . Years of Education: N/A   Occupational History  . Not on file.   Social History Main Topics  . Smoking status: Never Smoker   . Smokeless tobacco: Never Used  . Alcohol Use: No  . Drug Use: No  . Sexual Activity:    Partners: Male   Other Topics Concern  . Not on file   Social History Narrative    ROS: no fevers or chills, productive cough, hemoptysis, dysphasia, odynophagia, melena, hematochezia, dysuria, hematuria, rash, seizure activity, orthopnea, PND, pedal edema, claudication. Remaining systems are negative.  Physical Exam: Well-developed well-nourished in no acute distress.  Skin is warm and dry.  HEENT is normal.  Neck is supple.  Chest is clear to auscultation with normal expansion.  Cardiovascular exam is regular rate and rhythm.  Abdominal exam nontender or distended. No masses palpated. Extremities show no edema. neuro grossly intact  ECG

## 2014-05-29 NOTE — Assessment & Plan Note (Signed)
Continue statin. 

## 2014-05-29 NOTE — Patient Instructions (Signed)
Your physician wants you to follow-up in: 3 Titus will receive a reminder letter in the mail two months in advance. If you don't receive a letter, please call our office to schedule the follow-up appointment.   DO NOT TAKE XARELTO TOMORROW- CHANGE TO 15 MG ONE TABLET ONCE DAILY

## 2014-05-29 NOTE — Assessment & Plan Note (Signed)
In reviewing her records the patient has had TIAs previously in the setting of patent foramen ovale and atrial septal aneurysm. She had a repeat TIA despite aspirin and Plavix. She was therefore anticoagulated. Her apixaban was changed to xarelto recently as this was less expensive; she is taking 15 mg BID by mistake; change to 15 mg daily (GFR 31); repeat CBC and BMET 3 months.

## 2014-05-31 DIAGNOSIS — N183 Chronic kidney disease, stage 3 (moderate): Secondary | ICD-10-CM | POA: Diagnosis not present

## 2014-05-31 DIAGNOSIS — R32 Unspecified urinary incontinence: Secondary | ICD-10-CM | POA: Diagnosis not present

## 2014-05-31 DIAGNOSIS — M1990 Unspecified osteoarthritis, unspecified site: Secondary | ICD-10-CM | POA: Diagnosis not present

## 2014-05-31 DIAGNOSIS — Z8673 Personal history of transient ischemic attack (TIA), and cerebral infarction without residual deficits: Secondary | ICD-10-CM | POA: Diagnosis not present

## 2014-05-31 DIAGNOSIS — Z96651 Presence of right artificial knee joint: Secondary | ICD-10-CM | POA: Diagnosis not present

## 2014-05-31 DIAGNOSIS — R609 Edema, unspecified: Secondary | ICD-10-CM | POA: Diagnosis not present

## 2014-05-31 DIAGNOSIS — Q211 Atrial septal defect: Secondary | ICD-10-CM | POA: Diagnosis not present

## 2014-05-31 DIAGNOSIS — D649 Anemia, unspecified: Secondary | ICD-10-CM | POA: Diagnosis not present

## 2014-05-31 DIAGNOSIS — R2681 Unsteadiness on feet: Secondary | ICD-10-CM | POA: Diagnosis not present

## 2014-05-31 DIAGNOSIS — I129 Hypertensive chronic kidney disease with stage 1 through stage 4 chronic kidney disease, or unspecified chronic kidney disease: Secondary | ICD-10-CM | POA: Diagnosis not present

## 2014-05-31 DIAGNOSIS — E042 Nontoxic multinodular goiter: Secondary | ICD-10-CM | POA: Diagnosis not present

## 2014-06-01 DIAGNOSIS — N183 Chronic kidney disease, stage 3 (moderate): Secondary | ICD-10-CM | POA: Diagnosis not present

## 2014-06-01 DIAGNOSIS — R2681 Unsteadiness on feet: Secondary | ICD-10-CM | POA: Diagnosis not present

## 2014-06-01 DIAGNOSIS — Z8673 Personal history of transient ischemic attack (TIA), and cerebral infarction without residual deficits: Secondary | ICD-10-CM | POA: Diagnosis not present

## 2014-06-01 DIAGNOSIS — R609 Edema, unspecified: Secondary | ICD-10-CM | POA: Diagnosis not present

## 2014-06-01 DIAGNOSIS — I129 Hypertensive chronic kidney disease with stage 1 through stage 4 chronic kidney disease, or unspecified chronic kidney disease: Secondary | ICD-10-CM | POA: Diagnosis not present

## 2014-06-01 DIAGNOSIS — M1990 Unspecified osteoarthritis, unspecified site: Secondary | ICD-10-CM | POA: Diagnosis not present

## 2014-06-04 DIAGNOSIS — I129 Hypertensive chronic kidney disease with stage 1 through stage 4 chronic kidney disease, or unspecified chronic kidney disease: Secondary | ICD-10-CM | POA: Diagnosis not present

## 2014-06-04 DIAGNOSIS — N183 Chronic kidney disease, stage 3 (moderate): Secondary | ICD-10-CM | POA: Diagnosis not present

## 2014-06-04 DIAGNOSIS — R2681 Unsteadiness on feet: Secondary | ICD-10-CM | POA: Diagnosis not present

## 2014-06-04 DIAGNOSIS — R609 Edema, unspecified: Secondary | ICD-10-CM | POA: Diagnosis not present

## 2014-06-04 DIAGNOSIS — M1990 Unspecified osteoarthritis, unspecified site: Secondary | ICD-10-CM | POA: Diagnosis not present

## 2014-06-04 DIAGNOSIS — Z8673 Personal history of transient ischemic attack (TIA), and cerebral infarction without residual deficits: Secondary | ICD-10-CM | POA: Diagnosis not present

## 2014-06-05 ENCOUNTER — Ambulatory Visit (INDEPENDENT_AMBULATORY_CARE_PROVIDER_SITE_OTHER): Payer: Medicare Other | Admitting: Internal Medicine

## 2014-06-05 ENCOUNTER — Encounter: Payer: Self-pay | Admitting: Internal Medicine

## 2014-06-05 VITALS — BP 138/69 | HR 66 | Resp 16 | Ht 61.0 in | Wt 141.0 lb

## 2014-06-05 DIAGNOSIS — R2681 Unsteadiness on feet: Secondary | ICD-10-CM | POA: Diagnosis not present

## 2014-06-05 DIAGNOSIS — I10 Essential (primary) hypertension: Secondary | ICD-10-CM | POA: Diagnosis not present

## 2014-06-05 DIAGNOSIS — Z8673 Personal history of transient ischemic attack (TIA), and cerebral infarction without residual deficits: Secondary | ICD-10-CM | POA: Diagnosis not present

## 2014-06-05 DIAGNOSIS — R748 Abnormal levels of other serum enzymes: Secondary | ICD-10-CM

## 2014-06-05 DIAGNOSIS — R609 Edema, unspecified: Secondary | ICD-10-CM | POA: Diagnosis not present

## 2014-06-05 DIAGNOSIS — R7989 Other specified abnormal findings of blood chemistry: Secondary | ICD-10-CM

## 2014-06-05 DIAGNOSIS — M1990 Unspecified osteoarthritis, unspecified site: Secondary | ICD-10-CM | POA: Diagnosis not present

## 2014-06-05 DIAGNOSIS — N183 Chronic kidney disease, stage 3 (moderate): Secondary | ICD-10-CM | POA: Diagnosis not present

## 2014-06-05 DIAGNOSIS — I129 Hypertensive chronic kidney disease with stage 1 through stage 4 chronic kidney disease, or unspecified chronic kidney disease: Secondary | ICD-10-CM | POA: Diagnosis not present

## 2014-06-05 NOTE — Progress Notes (Signed)
   Subjective:    Patient ID: Holly Nunez, female    DOB: [birth year], 81 y.o.   MRN: [id number]  HPI 05/2014 cardiology note TIA (transient ischemic attack) - Lelon Perla, MD at 05/29/2014 11:53 AM     Status: Written Related Problem: TIA (transient ischemic attack)   Expand All Collapse All   In reviewing her records the patient has had TIAs previously in the setting of patent foramen ovale and atrial septal aneurysm. She had a repeat TIA despite aspirin and Plavix. She was therefore anticoagulated. Her apixaban was changed to xarelto recently as this was less expensive; she is taking 15 mg BID by mistake; change to 15 mg daily (GFR 31); repeat CBC and BMET 3 months.            HTN (hypertension) - Lelon Perla, MD at 05/29/2014 11:53 AM     Status: Written Related Problem: HTN (hypertension)   Expand All Collapse All   Blood pressure controlled. Continue present medications.            Hyperlipidemia - Lelon Perla, MD at 05/29/2014 11:54 AM     Status: Written Related Problem: Hyperlipidemia   Expand All Collapse All   Continue statin.       04/2014 my note HTN will continue Coreg and maxzide Check bmp today . At advanced age I do not want to be overly aggressive And risk falls  LE edema improving  See me in January   TODAY:  Holly Nunez is here for follow up.    HTN  Bp great on current meds.    See cardiology note above  Slightly elevated  Creatinine  Likely due to Maxzide   Review of Systems See HPI    Objective:   Physical Exam  Physical Exam  Nursing note and vitals reviewed.  Constitutional: She is oriented to person, place, and time. She appears well-developed and well-nourished.  HENT:  Head: Normocephalic and atraumatic.  Cardiovascular: Normal rate and regular rhythm. Exam reveals no gallop and no friction rub.  No murmur heard.  Pulmonary/Chest: Breath sounds normal. She has no wheezes. She has no rales.    Neurological: She is alert and oriented to person, place, and time.  Skin: Skin is warm and dry.  Psychiatric: She has a normal mood and affect. Her behavior is normal.             Assessment & Plan:  HTN  Continue meds  Slightly elevated creatinine   Likely dur to diuretic  Recheck 3 months

## 2014-06-05 NOTE — Patient Instructions (Signed)
See me in 3 months

## 2014-06-06 DIAGNOSIS — R2681 Unsteadiness on feet: Secondary | ICD-10-CM | POA: Diagnosis not present

## 2014-06-06 DIAGNOSIS — M1990 Unspecified osteoarthritis, unspecified site: Secondary | ICD-10-CM | POA: Diagnosis not present

## 2014-06-06 DIAGNOSIS — Z8673 Personal history of transient ischemic attack (TIA), and cerebral infarction without residual deficits: Secondary | ICD-10-CM | POA: Diagnosis not present

## 2014-06-06 DIAGNOSIS — N183 Chronic kidney disease, stage 3 (moderate): Secondary | ICD-10-CM | POA: Diagnosis not present

## 2014-06-06 DIAGNOSIS — R609 Edema, unspecified: Secondary | ICD-10-CM | POA: Diagnosis not present

## 2014-06-06 DIAGNOSIS — I129 Hypertensive chronic kidney disease with stage 1 through stage 4 chronic kidney disease, or unspecified chronic kidney disease: Secondary | ICD-10-CM | POA: Diagnosis not present

## 2014-06-07 DIAGNOSIS — I129 Hypertensive chronic kidney disease with stage 1 through stage 4 chronic kidney disease, or unspecified chronic kidney disease: Secondary | ICD-10-CM | POA: Diagnosis not present

## 2014-06-07 DIAGNOSIS — M1990 Unspecified osteoarthritis, unspecified site: Secondary | ICD-10-CM | POA: Diagnosis not present

## 2014-06-07 DIAGNOSIS — Z8673 Personal history of transient ischemic attack (TIA), and cerebral infarction without residual deficits: Secondary | ICD-10-CM | POA: Diagnosis not present

## 2014-06-07 DIAGNOSIS — R2681 Unsteadiness on feet: Secondary | ICD-10-CM | POA: Diagnosis not present

## 2014-06-07 DIAGNOSIS — N183 Chronic kidney disease, stage 3 (moderate): Secondary | ICD-10-CM | POA: Diagnosis not present

## 2014-06-07 DIAGNOSIS — R609 Edema, unspecified: Secondary | ICD-10-CM | POA: Diagnosis not present

## 2014-06-11 DIAGNOSIS — R2681 Unsteadiness on feet: Secondary | ICD-10-CM | POA: Diagnosis not present

## 2014-06-11 DIAGNOSIS — M1990 Unspecified osteoarthritis, unspecified site: Secondary | ICD-10-CM | POA: Diagnosis not present

## 2014-06-11 DIAGNOSIS — I129 Hypertensive chronic kidney disease with stage 1 through stage 4 chronic kidney disease, or unspecified chronic kidney disease: Secondary | ICD-10-CM | POA: Diagnosis not present

## 2014-06-11 DIAGNOSIS — Z8673 Personal history of transient ischemic attack (TIA), and cerebral infarction without residual deficits: Secondary | ICD-10-CM | POA: Diagnosis not present

## 2014-06-11 DIAGNOSIS — N183 Chronic kidney disease, stage 3 (moderate): Secondary | ICD-10-CM | POA: Diagnosis not present

## 2014-06-11 DIAGNOSIS — R609 Edema, unspecified: Secondary | ICD-10-CM | POA: Diagnosis not present

## 2014-06-13 DIAGNOSIS — R609 Edema, unspecified: Secondary | ICD-10-CM | POA: Diagnosis not present

## 2014-06-13 DIAGNOSIS — M1990 Unspecified osteoarthritis, unspecified site: Secondary | ICD-10-CM | POA: Diagnosis not present

## 2014-06-13 DIAGNOSIS — I129 Hypertensive chronic kidney disease with stage 1 through stage 4 chronic kidney disease, or unspecified chronic kidney disease: Secondary | ICD-10-CM | POA: Diagnosis not present

## 2014-06-13 DIAGNOSIS — N183 Chronic kidney disease, stage 3 (moderate): Secondary | ICD-10-CM | POA: Diagnosis not present

## 2014-06-13 DIAGNOSIS — Z8673 Personal history of transient ischemic attack (TIA), and cerebral infarction without residual deficits: Secondary | ICD-10-CM | POA: Diagnosis not present

## 2014-06-13 DIAGNOSIS — R2681 Unsteadiness on feet: Secondary | ICD-10-CM | POA: Diagnosis not present

## 2014-06-14 DIAGNOSIS — M1990 Unspecified osteoarthritis, unspecified site: Secondary | ICD-10-CM | POA: Diagnosis not present

## 2014-06-14 DIAGNOSIS — R2681 Unsteadiness on feet: Secondary | ICD-10-CM | POA: Diagnosis not present

## 2014-06-14 DIAGNOSIS — Z8673 Personal history of transient ischemic attack (TIA), and cerebral infarction without residual deficits: Secondary | ICD-10-CM | POA: Diagnosis not present

## 2014-06-14 DIAGNOSIS — R609 Edema, unspecified: Secondary | ICD-10-CM | POA: Diagnosis not present

## 2014-06-14 DIAGNOSIS — I129 Hypertensive chronic kidney disease with stage 1 through stage 4 chronic kidney disease, or unspecified chronic kidney disease: Secondary | ICD-10-CM | POA: Diagnosis not present

## 2014-06-14 DIAGNOSIS — N183 Chronic kidney disease, stage 3 (moderate): Secondary | ICD-10-CM | POA: Diagnosis not present

## 2014-06-18 DIAGNOSIS — R2681 Unsteadiness on feet: Secondary | ICD-10-CM | POA: Diagnosis not present

## 2014-06-18 DIAGNOSIS — Z8673 Personal history of transient ischemic attack (TIA), and cerebral infarction without residual deficits: Secondary | ICD-10-CM | POA: Diagnosis not present

## 2014-06-18 DIAGNOSIS — M1990 Unspecified osteoarthritis, unspecified site: Secondary | ICD-10-CM | POA: Diagnosis not present

## 2014-06-18 DIAGNOSIS — N183 Chronic kidney disease, stage 3 (moderate): Secondary | ICD-10-CM | POA: Diagnosis not present

## 2014-06-18 DIAGNOSIS — R609 Edema, unspecified: Secondary | ICD-10-CM | POA: Diagnosis not present

## 2014-06-18 DIAGNOSIS — I129 Hypertensive chronic kidney disease with stage 1 through stage 4 chronic kidney disease, or unspecified chronic kidney disease: Secondary | ICD-10-CM | POA: Diagnosis not present

## 2014-06-19 ENCOUNTER — Encounter: Payer: Self-pay | Admitting: *Deleted

## 2014-06-20 ENCOUNTER — Other Ambulatory Visit: Payer: Self-pay | Admitting: *Deleted

## 2014-06-20 MED ORDER — OXYBUTYNIN CHLORIDE ER 5 MG PO TB24: ORAL_TABLET | ORAL | Status: DC

## 2014-06-20 MED ORDER — TRIAMTERENE-HCTZ 37.5-25 MG PO TABS: ORAL_TABLET | ORAL | Status: DC

## 2014-06-20 NOTE — Telephone Encounter (Signed)
Refill request

## 2014-06-21 DIAGNOSIS — Z8673 Personal history of transient ischemic attack (TIA), and cerebral infarction without residual deficits: Secondary | ICD-10-CM | POA: Diagnosis not present

## 2014-06-21 DIAGNOSIS — I129 Hypertensive chronic kidney disease with stage 1 through stage 4 chronic kidney disease, or unspecified chronic kidney disease: Secondary | ICD-10-CM | POA: Diagnosis not present

## 2014-06-21 DIAGNOSIS — R609 Edema, unspecified: Secondary | ICD-10-CM | POA: Diagnosis not present

## 2014-06-21 DIAGNOSIS — N183 Chronic kidney disease, stage 3 (moderate): Secondary | ICD-10-CM | POA: Diagnosis not present

## 2014-06-21 DIAGNOSIS — R2681 Unsteadiness on feet: Secondary | ICD-10-CM | POA: Diagnosis not present

## 2014-06-21 DIAGNOSIS — M1990 Unspecified osteoarthritis, unspecified site: Secondary | ICD-10-CM | POA: Diagnosis not present

## 2014-06-24 DIAGNOSIS — N183 Chronic kidney disease, stage 3 (moderate): Secondary | ICD-10-CM | POA: Diagnosis not present

## 2014-06-24 DIAGNOSIS — Z8673 Personal history of transient ischemic attack (TIA), and cerebral infarction without residual deficits: Secondary | ICD-10-CM | POA: Diagnosis not present

## 2014-06-24 DIAGNOSIS — R609 Edema, unspecified: Secondary | ICD-10-CM | POA: Diagnosis not present

## 2014-06-24 DIAGNOSIS — R2681 Unsteadiness on feet: Secondary | ICD-10-CM | POA: Diagnosis not present

## 2014-06-24 DIAGNOSIS — I129 Hypertensive chronic kidney disease with stage 1 through stage 4 chronic kidney disease, or unspecified chronic kidney disease: Secondary | ICD-10-CM | POA: Diagnosis not present

## 2014-06-24 DIAGNOSIS — M1990 Unspecified osteoarthritis, unspecified site: Secondary | ICD-10-CM | POA: Diagnosis not present

## 2014-06-28 DIAGNOSIS — R2681 Unsteadiness on feet: Secondary | ICD-10-CM | POA: Diagnosis not present

## 2014-06-28 DIAGNOSIS — R609 Edema, unspecified: Secondary | ICD-10-CM | POA: Diagnosis not present

## 2014-06-28 DIAGNOSIS — M1990 Unspecified osteoarthritis, unspecified site: Secondary | ICD-10-CM | POA: Diagnosis not present

## 2014-06-28 DIAGNOSIS — Z8673 Personal history of transient ischemic attack (TIA), and cerebral infarction without residual deficits: Secondary | ICD-10-CM | POA: Diagnosis not present

## 2014-06-28 DIAGNOSIS — N183 Chronic kidney disease, stage 3 (moderate): Secondary | ICD-10-CM | POA: Diagnosis not present

## 2014-06-28 DIAGNOSIS — I129 Hypertensive chronic kidney disease with stage 1 through stage 4 chronic kidney disease, or unspecified chronic kidney disease: Secondary | ICD-10-CM | POA: Diagnosis not present

## 2014-06-30 DIAGNOSIS — R2681 Unsteadiness on feet: Secondary | ICD-10-CM | POA: Diagnosis not present

## 2014-06-30 DIAGNOSIS — I129 Hypertensive chronic kidney disease with stage 1 through stage 4 chronic kidney disease, or unspecified chronic kidney disease: Secondary | ICD-10-CM | POA: Diagnosis not present

## 2014-06-30 DIAGNOSIS — Z8673 Personal history of transient ischemic attack (TIA), and cerebral infarction without residual deficits: Secondary | ICD-10-CM | POA: Diagnosis not present

## 2014-06-30 DIAGNOSIS — N183 Chronic kidney disease, stage 3 (moderate): Secondary | ICD-10-CM | POA: Diagnosis not present

## 2014-06-30 DIAGNOSIS — R609 Edema, unspecified: Secondary | ICD-10-CM | POA: Diagnosis not present

## 2014-06-30 DIAGNOSIS — M1990 Unspecified osteoarthritis, unspecified site: Secondary | ICD-10-CM | POA: Diagnosis not present

## 2014-07-03 DIAGNOSIS — R2681 Unsteadiness on feet: Secondary | ICD-10-CM | POA: Diagnosis not present

## 2014-07-03 DIAGNOSIS — R609 Edema, unspecified: Secondary | ICD-10-CM | POA: Diagnosis not present

## 2014-07-03 DIAGNOSIS — I129 Hypertensive chronic kidney disease with stage 1 through stage 4 chronic kidney disease, or unspecified chronic kidney disease: Secondary | ICD-10-CM | POA: Diagnosis not present

## 2014-07-03 DIAGNOSIS — Z8673 Personal history of transient ischemic attack (TIA), and cerebral infarction without residual deficits: Secondary | ICD-10-CM | POA: Diagnosis not present

## 2014-07-03 DIAGNOSIS — N183 Chronic kidney disease, stage 3 (moderate): Secondary | ICD-10-CM | POA: Diagnosis not present

## 2014-07-03 DIAGNOSIS — M1990 Unspecified osteoarthritis, unspecified site: Secondary | ICD-10-CM | POA: Diagnosis not present

## 2014-07-04 DIAGNOSIS — R2681 Unsteadiness on feet: Secondary | ICD-10-CM | POA: Diagnosis not present

## 2014-07-04 DIAGNOSIS — N183 Chronic kidney disease, stage 3 (moderate): Secondary | ICD-10-CM | POA: Diagnosis not present

## 2014-07-04 DIAGNOSIS — M1990 Unspecified osteoarthritis, unspecified site: Secondary | ICD-10-CM | POA: Diagnosis not present

## 2014-07-04 DIAGNOSIS — I129 Hypertensive chronic kidney disease with stage 1 through stage 4 chronic kidney disease, or unspecified chronic kidney disease: Secondary | ICD-10-CM | POA: Diagnosis not present

## 2014-07-04 DIAGNOSIS — Z8673 Personal history of transient ischemic attack (TIA), and cerebral infarction without residual deficits: Secondary | ICD-10-CM | POA: Diagnosis not present

## 2014-07-04 DIAGNOSIS — R609 Edema, unspecified: Secondary | ICD-10-CM | POA: Diagnosis not present

## 2014-07-08 DIAGNOSIS — N183 Chronic kidney disease, stage 3 (moderate): Secondary | ICD-10-CM | POA: Diagnosis not present

## 2014-07-08 DIAGNOSIS — M1990 Unspecified osteoarthritis, unspecified site: Secondary | ICD-10-CM | POA: Diagnosis not present

## 2014-07-08 DIAGNOSIS — R2681 Unsteadiness on feet: Secondary | ICD-10-CM | POA: Diagnosis not present

## 2014-07-08 DIAGNOSIS — R609 Edema, unspecified: Secondary | ICD-10-CM | POA: Diagnosis not present

## 2014-07-08 DIAGNOSIS — I129 Hypertensive chronic kidney disease with stage 1 through stage 4 chronic kidney disease, or unspecified chronic kidney disease: Secondary | ICD-10-CM | POA: Diagnosis not present

## 2014-07-08 DIAGNOSIS — Z8673 Personal history of transient ischemic attack (TIA), and cerebral infarction without residual deficits: Secondary | ICD-10-CM | POA: Diagnosis not present

## 2014-07-12 DIAGNOSIS — R609 Edema, unspecified: Secondary | ICD-10-CM | POA: Diagnosis not present

## 2014-07-12 DIAGNOSIS — R2681 Unsteadiness on feet: Secondary | ICD-10-CM | POA: Diagnosis not present

## 2014-07-12 DIAGNOSIS — Z8673 Personal history of transient ischemic attack (TIA), and cerebral infarction without residual deficits: Secondary | ICD-10-CM | POA: Diagnosis not present

## 2014-07-12 DIAGNOSIS — M1990 Unspecified osteoarthritis, unspecified site: Secondary | ICD-10-CM | POA: Diagnosis not present

## 2014-07-12 DIAGNOSIS — N183 Chronic kidney disease, stage 3 (moderate): Secondary | ICD-10-CM | POA: Diagnosis not present

## 2014-07-12 DIAGNOSIS — I129 Hypertensive chronic kidney disease with stage 1 through stage 4 chronic kidney disease, or unspecified chronic kidney disease: Secondary | ICD-10-CM | POA: Diagnosis not present

## 2014-07-15 DIAGNOSIS — I129 Hypertensive chronic kidney disease with stage 1 through stage 4 chronic kidney disease, or unspecified chronic kidney disease: Secondary | ICD-10-CM | POA: Diagnosis not present

## 2014-07-15 DIAGNOSIS — R2681 Unsteadiness on feet: Secondary | ICD-10-CM | POA: Diagnosis not present

## 2014-07-15 DIAGNOSIS — N183 Chronic kidney disease, stage 3 (moderate): Secondary | ICD-10-CM | POA: Diagnosis not present

## 2014-07-15 DIAGNOSIS — R609 Edema, unspecified: Secondary | ICD-10-CM | POA: Diagnosis not present

## 2014-07-15 DIAGNOSIS — M1990 Unspecified osteoarthritis, unspecified site: Secondary | ICD-10-CM | POA: Diagnosis not present

## 2014-07-15 DIAGNOSIS — Z8673 Personal history of transient ischemic attack (TIA), and cerebral infarction without residual deficits: Secondary | ICD-10-CM | POA: Diagnosis not present

## 2014-07-18 DIAGNOSIS — R609 Edema, unspecified: Secondary | ICD-10-CM | POA: Diagnosis not present

## 2014-07-18 DIAGNOSIS — M1990 Unspecified osteoarthritis, unspecified site: Secondary | ICD-10-CM | POA: Diagnosis not present

## 2014-07-18 DIAGNOSIS — Z8673 Personal history of transient ischemic attack (TIA), and cerebral infarction without residual deficits: Secondary | ICD-10-CM | POA: Diagnosis not present

## 2014-07-18 DIAGNOSIS — I129 Hypertensive chronic kidney disease with stage 1 through stage 4 chronic kidney disease, or unspecified chronic kidney disease: Secondary | ICD-10-CM | POA: Diagnosis not present

## 2014-07-18 DIAGNOSIS — R2681 Unsteadiness on feet: Secondary | ICD-10-CM | POA: Diagnosis not present

## 2014-07-18 DIAGNOSIS — N183 Chronic kidney disease, stage 3 (moderate): Secondary | ICD-10-CM | POA: Diagnosis not present

## 2014-07-23 DIAGNOSIS — R609 Edema, unspecified: Secondary | ICD-10-CM | POA: Diagnosis not present

## 2014-07-23 DIAGNOSIS — I129 Hypertensive chronic kidney disease with stage 1 through stage 4 chronic kidney disease, or unspecified chronic kidney disease: Secondary | ICD-10-CM | POA: Diagnosis not present

## 2014-07-23 DIAGNOSIS — N183 Chronic kidney disease, stage 3 (moderate): Secondary | ICD-10-CM | POA: Diagnosis not present

## 2014-07-23 DIAGNOSIS — R2681 Unsteadiness on feet: Secondary | ICD-10-CM | POA: Diagnosis not present

## 2014-07-23 DIAGNOSIS — M1990 Unspecified osteoarthritis, unspecified site: Secondary | ICD-10-CM | POA: Diagnosis not present

## 2014-07-23 DIAGNOSIS — Z8673 Personal history of transient ischemic attack (TIA), and cerebral infarction without residual deficits: Secondary | ICD-10-CM | POA: Diagnosis not present

## 2014-07-24 DIAGNOSIS — I129 Hypertensive chronic kidney disease with stage 1 through stage 4 chronic kidney disease, or unspecified chronic kidney disease: Secondary | ICD-10-CM | POA: Diagnosis not present

## 2014-07-24 DIAGNOSIS — N183 Chronic kidney disease, stage 3 (moderate): Secondary | ICD-10-CM | POA: Diagnosis not present

## 2014-07-24 DIAGNOSIS — R2681 Unsteadiness on feet: Secondary | ICD-10-CM | POA: Diagnosis not present

## 2014-07-24 DIAGNOSIS — Z8673 Personal history of transient ischemic attack (TIA), and cerebral infarction without residual deficits: Secondary | ICD-10-CM | POA: Diagnosis not present

## 2014-07-24 DIAGNOSIS — R609 Edema, unspecified: Secondary | ICD-10-CM | POA: Diagnosis not present

## 2014-07-24 DIAGNOSIS — M1990 Unspecified osteoarthritis, unspecified site: Secondary | ICD-10-CM | POA: Diagnosis not present

## 2014-07-29 ENCOUNTER — Other Ambulatory Visit: Payer: Self-pay | Admitting: *Deleted

## 2014-07-29 NOTE — Telephone Encounter (Signed)
Refill request

## 2014-07-30 MED ORDER — CARVEDILOL 3.125 MG PO TABS: ORAL_TABLET | ORAL | Status: DC

## 2014-08-07 ENCOUNTER — Other Ambulatory Visit: Payer: Self-pay | Admitting: *Deleted

## 2014-08-07 MED ORDER — ATORVASTATIN CALCIUM 40 MG PO TABS: 40.0000 mg | ORAL_TABLET | Freq: Every day | ORAL | Status: DC

## 2014-08-07 NOTE — Telephone Encounter (Signed)
I called Holly Nunez and moved her appointment up to April 26th

## 2014-08-07 NOTE — Telephone Encounter (Signed)
Holly Nunez   Call pt and move up her follow up appt to the end of April as I want to check her liver tests since she is on Lipitor.  I sent a refill

## 2014-08-07 NOTE — Telephone Encounter (Signed)
Refill request

## 2014-08-08 ENCOUNTER — Ambulatory Visit: Payer: Medicare Other | Admitting: Internal Medicine

## 2014-08-28 ENCOUNTER — Ambulatory Visit (INDEPENDENT_AMBULATORY_CARE_PROVIDER_SITE_OTHER): Payer: Medicare Other | Admitting: Cardiology

## 2014-08-28 ENCOUNTER — Encounter: Payer: Self-pay | Admitting: Cardiology

## 2014-08-28 VITALS — BP 140/60 | HR 56 | Ht 61.0 in | Wt 143.8 lb

## 2014-08-28 DIAGNOSIS — E785 Hyperlipidemia, unspecified: Secondary | ICD-10-CM

## 2014-08-28 DIAGNOSIS — I10 Essential (primary) hypertension: Secondary | ICD-10-CM | POA: Diagnosis not present

## 2014-08-28 DIAGNOSIS — G459 Transient cerebral ischemic attack, unspecified: Secondary | ICD-10-CM | POA: Diagnosis not present

## 2014-08-28 MED ORDER — RIVAROXABAN 15 MG PO TABS: 15.0000 mg | ORAL_TABLET | Freq: Every day | ORAL | Status: DC

## 2014-08-28 NOTE — Patient Instructions (Signed)
Your physician wants you to follow-up in: 6 MONTHS WITH DR CRENSHAW You will receive a reminder letter in the mail two months in advance. If you don't receive a letter, please call our office to schedule the follow-up appointment.   Your physician recommends that you HAVE LAB WORK TODAY 

## 2014-08-28 NOTE — Progress Notes (Signed)
      HPI: FU hypertension, patent foramen ovale and prior TIA. Patient previously lived in [place]. Echocardiogram in November 2014 showed normal LV function, mild to moderate left ventricular hypertrophy, grade 2 diastolic dysfunction, aneurysmal atrial septum with PFO. There was moderate atherosclerosis in the aortic arch. She has a history of TIAs but no other cardiac history. She has been treated with apixaban. Since last seen, there is no dyspnea, chest pain, palpitations or syncope.  Current Outpatient Prescriptions  Medication Sig Dispense Refill  . atorvastatin (LIPITOR) 40 MG tablet Take 1 tablet (40 mg total) by mouth daily. 90 tablet 0  . brimonidine (ALPHAGAN) 0.15 % ophthalmic solution Place 1 drop into both eyes 3 (three) times daily.    . carvedilol (COREG) 3.125 MG tablet Take 2 tablets bid 120 tablet 1  . nitroGLYCERIN (NITROLINGUAL) 0.4 MG/SPRAY spray Place 1 spray under the tongue every 5 (five) minutes x 3 doses as needed for chest pain.    Marland Kitchen oxybutynin (DITROPAN XL) 5 MG 24 hr tablet Take one daily 30 tablet 2  . Rivaroxaban (XARELTO) 15 MG TABS tablet Take 1 tablet (15 mg total) by mouth daily with supper. 30 tablet 11  . triamterene-hydrochlorothiazide (MAXZIDE-25) 37.5-25 MG per tablet Take one every am 30 tablet 2   No current facility-administered medications for this visit.     Past Medical History  Diagnosis Date  . Arthritis   . Hypertension   . Hyperlipidemia   . Thyroid nodule     Biopsy-negative  . Urinary incontinence   . Renal insufficiency   . Patent foramen ovale   . TIA (transient ischemic attack)     Past Surgical History  Procedure Laterality Date  . Abdominal hysterectomy    . Joint replacement    . Replacement total knee Right 2007  . Trigger finger release  2007  . Appendectomy      History   Social History  . Marital Status: Single    Spouse Name: N/A  . Number of Children: 4  . Years of Education: N/A   Occupational  History  . Not on file.   Social History Main Topics  . Smoking status: Never Smoker   . Smokeless tobacco: Never Used  . Alcohol Use: No  . Drug Use: No  . Sexual Activity:    Partners: Male   Other Topics Concern  . Not on file   Social History Narrative    ROS: no fevers or chills, productive cough, hemoptysis, dysphasia, odynophagia, melena, hematochezia, dysuria, hematuria, rash, seizure activity, orthopnea, PND, pedal edema, claudication. Remaining systems are negative.  Physical Exam: Well-developed well-nourished in no acute distress.  Skin is warm and dry.  HEENT is normal.  Neck is supple.  Chest is clear to auscultation with normal expansion.  Cardiovascular exam is regular rate and rhythm.  Abdominal exam nontender or distended. No masses palpated. Extremities show no edema. neuro grossly intact  ECG sinus bradycardia with no ST changes.

## 2014-08-28 NOTE — Assessment & Plan Note (Signed)
Blood pressure controlled. Continue present medications. 

## 2014-08-28 NOTE — Assessment & Plan Note (Signed)
Continue statin. 

## 2014-08-28 NOTE — Assessment & Plan Note (Signed)
In reviewing her outside records previously the patient has had TIAs in the setting of patent foramen ovale and atrial septal aneurysm. She had a repeat TIA despite aspirin and Plavix. She was therefore anticoagulated. Continue xarelto; repeat CBC and BMET.

## 2014-08-29 LAB — CBC
HEMATOCRIT: 38.4 % (ref 36.0–46.0)
Hemoglobin: 12.8 g/dL (ref 12.0–15.0)
MCH: 31.5 pg (ref 26.0–34.0)
MCHC: 33.3 g/dL (ref 30.0–36.0)
MCV: 94.6 fL (ref 78.0–100.0)
MPV: 9.9 fL (ref 8.6–12.4)
Platelets: 218 10*3/uL (ref 150–400)
RBC: 4.06 MIL/uL (ref 3.87–5.11)
RDW: 13.3 % (ref 11.5–15.5)
WBC: 7.3 10*3/uL (ref 4.0–10.5)

## 2014-08-29 LAB — BASIC METABOLIC PANEL WITH GFR
BUN: 20 mg/dL (ref 6–23)
CALCIUM: 10.2 mg/dL (ref 8.4–10.5)
CO2: 23 mEq/L (ref 19–32)
Chloride: 101 mEq/L (ref 96–112)
Creat: 1.17 mg/dL — ABNORMAL HIGH (ref 0.50–1.10)
GFR, Est African American: 48 mL/min — ABNORMAL LOW
GFR, Est Non African American: 42 mL/min — ABNORMAL LOW
GLUCOSE: 98 mg/dL (ref 70–99)
Potassium: 3.8 mEq/L (ref 3.5–5.3)
Sodium: 139 mEq/L (ref 135–145)

## 2014-09-03 ENCOUNTER — Encounter: Payer: Self-pay | Admitting: Cardiology

## 2014-09-10 ENCOUNTER — Encounter: Payer: Self-pay | Admitting: Internal Medicine

## 2014-09-10 ENCOUNTER — Ambulatory Visit (INDEPENDENT_AMBULATORY_CARE_PROVIDER_SITE_OTHER): Payer: Medicare Other | Admitting: Internal Medicine

## 2014-09-10 VITALS — BP 130/73 | HR 68 | Resp 16 | Ht 61.0 in | Wt 145.0 lb

## 2014-09-10 DIAGNOSIS — E785 Hyperlipidemia, unspecified: Secondary | ICD-10-CM | POA: Diagnosis not present

## 2014-09-10 DIAGNOSIS — I10 Essential (primary) hypertension: Secondary | ICD-10-CM

## 2014-09-10 DIAGNOSIS — N289 Disorder of kidney and ureter, unspecified: Secondary | ICD-10-CM | POA: Diagnosis not present

## 2014-09-10 DIAGNOSIS — N3946 Mixed incontinence: Secondary | ICD-10-CM | POA: Diagnosis not present

## 2014-09-10 MED ORDER — OXYBUTYNIN CHLORIDE ER 5 MG PO TB24: ORAL_TABLET | ORAL | Status: DC

## 2014-09-10 NOTE — Progress Notes (Signed)
Subjective:    Patient ID: Holly Nunez, female    DOB: [birth year], 81 y.o.   MRN: [id number]  HPI  08/28/2014 cardiology note  HTN (hypertension) - Lelon Perla, MD at 08/28/2014 11:56 AM     Status: Written Related Problem: HTN (hypertension)   Expand All Collapse All   Blood pressure controlled. Continue present medications.            Hyperlipidemia - Lelon Perla, MD at 08/28/2014 11:56 AM     Status: Written Related Problem: Hyperlipidemia   Expand All Collapse All   Continue statin.            TIA (transient ischemic attack) - Lelon Perla, MD at 08/28/2014 11:57 AM     Status: Written Related Problem: TIA (transient ischemic attack)   Expand All Collapse All   In reviewing her outside records previously the patient has had TIAs in the setting of patent foramen ovale and atrial septal aneurysm. She had a repeat TIA despite aspirin and Plavix. She was therefore anticoagulated. Continue xarelto; repeat CBC and BMET.         06/05/2014 note Assessment & Plan:  HTN Continue meds  Slightly elevated creatinine Likely dur to diuretic  Recheck 3 months       TODAY  Dann is here for follow up.  She is here with her daugheter  Holly Nunez  She is doing well.  Oxybutinin helping but still has urge issues at times   See cardiology note above on Xarelto   No Known Allergies Past Medical History  Diagnosis Date  . Arthritis   . Hypertension   . Hyperlipidemia   . Thyroid nodule     Biopsy-negative  . Urinary incontinence   . Renal insufficiency   . Patent foramen ovale   . TIA (transient ischemic attack)    Past Surgical History  Procedure Laterality Date  . Abdominal hysterectomy    . Joint replacement    . Replacement total knee Right 2007  . Trigger finger release  2007  . Appendectomy     History   Social History  . Marital Status: Single    Spouse Name: N/A  . Number of Children: 4  . Years of Education: N/A     Occupational History  . Not on file.   Social History Main Topics  . Smoking status: Never Smoker   . Smokeless tobacco: Never Used  . Alcohol Use: No  . Drug Use: No  . Sexual Activity:    Partners: Male   Other Topics Concern  . Not on file   Social History Narrative   Family History  Problem Relation Age of Onset  . Heart disease Mother   . Kidney disease Sister   . Cancer Sister   . Bone cancer Sister   . Pancreatic cancer Daughter    Patient Active Problem List   Diagnosis Date Noted  . TIA (transient ischemic attack) 03/20/2014  . Renal insufficiency 03/07/2014  . HTN (hypertension) 02/19/2014  . Hyperlipidemia 02/19/2014  . DJD (degenerative joint disease) 02/19/2014  . S/P hysterectomy 02/19/2014  . Multinodular goiter 02/19/2014  . Elevated serum creatinine 02/19/2014  . Anemia 02/19/2014  . Urinary incontinence 02/19/2014   Current Outpatient Prescriptions on File Prior to Visit  Medication Sig Dispense Refill  . atorvastatin (LIPITOR) 40 MG tablet Take 1 tablet (40 mg total) by mouth daily. 90 tablet 0  . brimonidine (ALPHAGAN) 0.15 % ophthalmic solution Place 1 drop  into both eyes 3 (three) times daily.    . carvedilol (COREG) 3.125 MG tablet Take 2 tablets bid 120 tablet 1  . nitroGLYCERIN (NITROLINGUAL) 0.4 MG/SPRAY spray Place 1 spray under the tongue every 5 (five) minutes x 3 doses as needed for chest pain.    Marland Kitchen oxybutynin (DITROPAN XL) 5 MG 24 hr tablet Take one daily 30 tablet 2  . Rivaroxaban (XARELTO) 15 MG TABS tablet Take 1 tablet (15 mg total) by mouth daily with supper. 30 tablet 11  . triamterene-hydrochlorothiazide (MAXZIDE-25) 37.5-25 MG per tablet Take one every am 30 tablet 2   No current facility-administered medications on file prior to visit.      Review of Systems See HPI    Objective:   Physical Exam Physical Exam  Nursing note and vitals reviewed.  Constitutional: She is oriented to person, place, and time. She appears  well-developed and well-nourished.  HENT:  Head: Normocephalic and atraumatic.  Cardiovascular: Normal rate and regular rhythm. Exam reveals no gallop and no friction rub.  No murmur heard.  Pulmonary/Chest: Breath sounds normal. She has no wheezes. She has no rales.  Neurological: She is alert and oriented to person, place, and time.  Skin: Skin is warm and dry.  Psychiatric: She has a normal mood and affect. Her behavior is normal.              Assessment & Plan:  HTN     Continue  Coreg and maxzide  Urge incontinence  Continue oxybutinin  TIAS with PFO with atrial septal aneurysm:  managed by cardiology  On Xarelto   Hyperlipidemia  On lipitor  CKD   Creatinine at baseline.  Pt does not wish any referral to nephrology at this point  Pt and daughter have received letter regarding my departure.  ADvised to make appt this week with PCP of choice

## 2014-09-19 ENCOUNTER — Other Ambulatory Visit: Payer: Self-pay | Admitting: *Deleted

## 2014-09-19 MED ORDER — CARVEDILOL 3.125 MG PO TABS: ORAL_TABLET | ORAL | Status: DC

## 2014-09-19 MED ORDER — TRIAMTERENE-HCTZ 37.5-25 MG PO TABS: ORAL_TABLET | ORAL | Status: DC

## 2014-10-07 ENCOUNTER — Ambulatory Visit: Payer: Medicare Other | Admitting: Internal Medicine

## 2015-01-17 ENCOUNTER — Ambulatory Visit (HOSPITAL_BASED_OUTPATIENT_CLINIC_OR_DEPARTMENT_OTHER)
Admission: RE | Admit: 2015-01-17 | Discharge: 2015-01-17 | Disposition: A | Discharge status: Home / Self Care | Payer: Medicare Other | Source: Ambulatory Visit | Attending: Family Medicine | Admitting: Family Medicine

## 2015-01-17 ENCOUNTER — Ambulatory Visit (INDEPENDENT_AMBULATORY_CARE_PROVIDER_SITE_OTHER): Payer: Medicare Other | Admitting: Family Medicine

## 2015-01-17 ENCOUNTER — Encounter: Payer: Self-pay | Admitting: Family Medicine

## 2015-01-17 VITALS — BP 134/66 | HR 65 | Temp 98.6°F | Ht 61.0 in | Wt 146.4 lb

## 2015-01-17 DIAGNOSIS — M4803 Spinal stenosis, cervicothoracic region: Secondary | ICD-10-CM | POA: Diagnosis not present

## 2015-01-17 DIAGNOSIS — M542 Cervicalgia: Secondary | ICD-10-CM

## 2015-01-17 DIAGNOSIS — I10 Essential (primary) hypertension: Secondary | ICD-10-CM

## 2015-01-17 DIAGNOSIS — E042 Nontoxic multinodular goiter: Secondary | ICD-10-CM

## 2015-01-17 DIAGNOSIS — M2578 Osteophyte, vertebrae: Secondary | ICD-10-CM | POA: Diagnosis not present

## 2015-01-17 DIAGNOSIS — Z8619 Personal history of other infectious and parasitic diseases: Secondary | ICD-10-CM | POA: Insufficient documentation

## 2015-01-17 DIAGNOSIS — M4802 Spinal stenosis, cervical region: Secondary | ICD-10-CM | POA: Insufficient documentation

## 2015-01-17 DIAGNOSIS — R29890 Loss of height: Secondary | ICD-10-CM | POA: Insufficient documentation

## 2015-01-17 DIAGNOSIS — Z23 Encounter for immunization: Secondary | ICD-10-CM | POA: Diagnosis not present

## 2015-01-17 DIAGNOSIS — D538 Other specified nutritional anemias: Secondary | ICD-10-CM

## 2015-01-17 DIAGNOSIS — M47812 Spondylosis without myelopathy or radiculopathy, cervical region: Secondary | ICD-10-CM | POA: Diagnosis not present

## 2015-01-17 DIAGNOSIS — E785 Hyperlipidemia, unspecified: Secondary | ICD-10-CM | POA: Diagnosis not present

## 2015-01-17 DIAGNOSIS — N289 Disorder of kidney and ureter, unspecified: Secondary | ICD-10-CM

## 2015-01-17 DIAGNOSIS — M5030 Other cervical disc degeneration, unspecified cervical region: Secondary | ICD-10-CM | POA: Diagnosis not present

## 2015-01-17 MED ORDER — CARVEDILOL 3.125 MG PO TABS: ORAL_TABLET | ORAL | Status: DC

## 2015-01-17 MED ORDER — TRIAMTERENE-HCTZ 37.5-25 MG PO TABS: ORAL_TABLET | ORAL | Status: DC

## 2015-01-17 MED ORDER — ATORVASTATIN CALCIUM 40 MG PO TABS: 40.0000 mg | ORAL_TABLET | Freq: Every day | ORAL | Status: DC

## 2015-01-17 NOTE — Progress Notes (Signed)
Pre visit review using our clinic review tool, if applicable. No additional management support is needed unless otherwise documented below in the visit note. 

## 2015-01-17 NOTE — Patient Instructions (Signed)
Salon Pas gel or patch twice daily or Aspercreme with Lidocaine patch twice daily  Neck Pain Adult Low back pain is very common. About 1 in 5 people have back pain.The cause of low back pain is rarely dangerous. The pain often gets better over time.About half of people with a sudden onset of back pain feel better in just 2 weeks. About 8 in 10 people feel better by 6 weeks.  CAUSES Some common causes of back pain include:  Strain of the muscles or ligaments supporting the spine.  Wear and tear (degeneration) of the spinal discs.  Arthritis.  Direct injury to the back. DIAGNOSIS Most of the time, the direct cause of low back pain is not known.However, back pain can be treated effectively even when the exact cause of the pain is unknown.Answering your caregiver's questions about your overall health and symptoms is one of the most accurate ways to make sure the cause of your pain is not dangerous. If your caregiver needs more information, he or she may order lab work or imaging tests (X-rays or MRIs).However, even if imaging tests show changes in your back, this usually does not require surgery. HOME CARE INSTRUCTIONS For many people, back pain returns.Since low back pain is rarely dangerous, it is often a condition that people can learn to [facility] their own.   Remain active. It is stressful on the back to sit or stand in one place. Do not sit, drive, or stand in one place for more than 30 minutes at a time. Take short walks on level surfaces as soon as pain allows.Try to increase the length of time you walk each day.  Do not stay in bed.Resting more than 1 or 2 days can delay your recovery.  Do not avoid exercise or work.Your body is made to move.It is not dangerous to be active, even though your back may hurt.Your back will likely heal faster if you return to being active before your pain is gone.  Pay attention to your body when you bend and lift. Many people have less  discomfortwhen lifting if they bend their knees, keep the load close to their bodies,and avoid twisting. Often, the most comfortable positions are those that put less stress on your recovering back.  Find a comfortable position to sleep. Use a firm mattress and lie on your side with your knees slightly bent. If you lie on your back, put a pillow under your knees.  Only take over-the-counter or prescription medicines as directed by your caregiver. Over-the-counter medicines to reduce pain and inflammation are often the most helpful.Your caregiver may prescribe muscle relaxant drugs.These medicines help dull your pain so you can more quickly return to your normal activities and healthy exercise.  Put ice on the injured area.  Put ice in a plastic bag.  Place a towel between your skin and the bag.  Leave the ice on for 15-20 minutes, 03-04 times a day for the first 2 to 3 days. After that, ice and heat may be alternated to reduce pain and spasms.  Ask your caregiver about trying back exercises and gentle massage. This may be of some benefit.  Avoid feeling anxious or stressed.Stress increases muscle tension and can worsen back pain.It is important to recognize when you are anxious or stressed and learn ways to manage it.Exercise is a great option. SEEK MEDICAL CARE IF:  You have pain that is not relieved with rest or medicine.  You have pain that does not improve in  1 week.  You have new symptoms.  You are generally not feeling well. SEEK IMMEDIATE MEDICAL CARE IF:   You have pain that radiates from your back into your legs.  You develop new bowel or bladder control problems.  You have unusual weakness or numbness in your arms or legs.  You develop nausea or vomiting.  You develop abdominal pain.  You feel faint. Document Released: 05/03/2005 Document Revised: 11/02/2011 Document Reviewed: 09/04/2013 [facility] Patient Information 2015 [place], [place]. This information is  not intended to replace advice given to you by your health care provider. Make sure you discuss any questions you have with your health care provider.

## 2015-01-18 ENCOUNTER — Other Ambulatory Visit: Payer: Self-pay | Admitting: Family Medicine

## 2015-01-18 DIAGNOSIS — M542 Cervicalgia: Secondary | ICD-10-CM

## 2015-01-24 ENCOUNTER — Ambulatory Visit (HOSPITAL_BASED_OUTPATIENT_CLINIC_OR_DEPARTMENT_OTHER)
Admission: RE | Admit: 2015-01-24 | Discharge: 2015-01-24 | Disposition: A | Discharge status: Home / Self Care | Payer: Medicare Other | Source: Ambulatory Visit | Attending: Family Medicine | Admitting: Family Medicine

## 2015-01-24 ENCOUNTER — Telehealth: Payer: Self-pay | Admitting: Family Medicine

## 2015-01-24 ENCOUNTER — Ambulatory Visit: Payer: Medicare Other | Admitting: Family Medicine

## 2015-01-24 DIAGNOSIS — M542 Cervicalgia: Secondary | ICD-10-CM | POA: Diagnosis not present

## 2015-01-24 DIAGNOSIS — E049 Nontoxic goiter, unspecified: Secondary | ICD-10-CM | POA: Insufficient documentation

## 2015-01-24 DIAGNOSIS — M47892 Other spondylosis, cervical region: Secondary | ICD-10-CM | POA: Diagnosis not present

## 2015-01-24 DIAGNOSIS — M47812 Spondylosis without myelopathy or radiculopathy, cervical region: Secondary | ICD-10-CM | POA: Diagnosis not present

## 2015-01-24 NOTE — Telephone Encounter (Signed)
Received call report from [facility] regarding CT done today 01/24/15.  Dr. Birdie Riddle did review the report which is now in EPIC.  I did call the Patient (spoke to her daughter Sc[place]aux at this time the patient is having very little pain, no SOB or problems swallowing. Informed per Dr. Birdie Riddle that PCP will contact the patient and family on Monday regarding result.

## 2015-01-26 ENCOUNTER — Other Ambulatory Visit: Payer: Self-pay | Admitting: Family Medicine

## 2015-01-26 DIAGNOSIS — N289 Disorder of kidney and ureter, unspecified: Secondary | ICD-10-CM

## 2015-01-26 DIAGNOSIS — E079 Disorder of thyroid, unspecified: Secondary | ICD-10-CM

## 2015-01-26 NOTE — Telephone Encounter (Signed)
See result note have referred to general surgery so we can try and determine what mass is.

## 2015-01-27 NOTE — Telephone Encounter (Signed)
Notified patient and MD spoke with patient's daughter.

## 2015-01-28 ENCOUNTER — Other Ambulatory Visit: Payer: Self-pay | Admitting: Family Medicine

## 2015-01-28 DIAGNOSIS — R222 Localized swelling, mass and lump, trunk: Secondary | ICD-10-CM

## 2015-02-01 ENCOUNTER — Ambulatory Visit (HOSPITAL_BASED_OUTPATIENT_CLINIC_OR_DEPARTMENT_OTHER)
Admission: RE | Admit: 2015-02-01 | Discharge: 2015-02-01 | Disposition: A | Discharge status: Home / Self Care | Payer: Medicare Other | Source: Ambulatory Visit | Attending: Family Medicine | Admitting: Family Medicine

## 2015-02-01 DIAGNOSIS — J398 Other specified diseases of upper respiratory tract: Secondary | ICD-10-CM | POA: Insufficient documentation

## 2015-02-01 DIAGNOSIS — R079 Chest pain, unspecified: Secondary | ICD-10-CM | POA: Diagnosis not present

## 2015-02-01 DIAGNOSIS — R222 Localized swelling, mass and lump, trunk: Secondary | ICD-10-CM

## 2015-02-01 DIAGNOSIS — E049 Nontoxic goiter, unspecified: Secondary | ICD-10-CM | POA: Insufficient documentation

## 2015-02-01 DIAGNOSIS — R05 Cough: Secondary | ICD-10-CM | POA: Diagnosis not present

## 2015-02-01 DIAGNOSIS — I2510 Atherosclerotic heart disease of native coronary artery without angina pectoris: Secondary | ICD-10-CM | POA: Insufficient documentation

## 2015-02-02 NOTE — Assessment & Plan Note (Signed)
Patient notes 3-4 months of increasing neck pain, imaging confirms very enlarged thyroid, referred to general surgery for further consideration.

## 2015-02-02 NOTE — Assessment & Plan Note (Signed)
Resolved, Increase leafy greens, consider increased lean red meat and using cast iron cookware. Continue to monitor, report any concerns 

## 2015-02-02 NOTE — Assessment & Plan Note (Signed)
Well controlled, no changes to meds. Encouraged heart healthy diet such as the DASH diet and exercise as tolerated.  °

## 2015-02-02 NOTE — Assessment & Plan Note (Signed)
Encouraged heart healthy diet, increase exercise, avoid trans fats, consider a krill oil cap daily 

## 2015-02-02 NOTE — Assessment & Plan Note (Signed)
No NSAIDs, maintain adequate hydration. monitor

## 2015-02-02 NOTE — Progress Notes (Signed)
Holly Nunez [id number] 1924/12/22 02/02/2015      Progress Note New Patient  Subjective  Chief Complaint  Chief Complaint  Patient presents with  . Establish Care    HPI  Patient is an 81 year old female who is in today for new patient appointment. Her previous PMD has left. She complains of 3-4 months of worsening neck pain. No dysphagia or shortness of breath. Does note decreased hearing over the last couple months as well. No other recent illness or acute concerns. Does acknowledge some fatigue. She has a past medical history significant for hypertension, hyperlipidemia, thyroid nodules, renal insufficiency and TIAs. Denies CP/palp/SOB/HA/congestion/fevers/GI or GU c/o. Taking meds as prescribed  Past Medical History  Diagnosis Date  . Arthritis   . Hypertension   . Hyperlipidemia   . Thyroid nodule     Biopsy-negative  . Urinary incontinence   . Renal insufficiency   . Patent foramen ovale   . TIA (transient ischemic attack)   . History of chicken pox   . H/O measles   . H/O mumps     Past Surgical History  Procedure Laterality Date  . Joint replacement    . Replacement total knee Right 2007  . Trigger finger release  2007  . Appendectomy    . Abdominal hysterectomy      fibroid    Family History  Problem Relation Age of Onset  . Heart disease Mother   . Hypertension Mother   . Kidney disease Sister   . Mental illness Sister     suicide, depresion  . Cancer Brother     prostate  . Gout Brother   . Hypertension Son   . Cancer Maternal Grandmother   . Cancer Sister   . Bone cancer Sister   . Cancer Sister   . Cancer Sister     kidney  . Cancer Daughter   . Pancreatic cancer Daughter   . Thyroid disease Daughter   . Thyroid disease Daughter     thyroid nodules  . Hypertension Daughter   . Irritable bowel syndrome Daughter   . Cancer Daughter     thyroid    Social History   Social History  . Marital Status: Single    Spouse Name: N/A  .  Number of Children: 4  . Years of Education: N/A   Occupational History  . Not on file.   Social History Main Topics  . Smoking status: Never Smoker   . Smokeless tobacco: Never Used  . Alcohol Use: No  . Drug Use: No  . Sexual Activity:    Partners: Male     Comment: no dietary restrictions, lives with 2 daughters   Other Topics Concern  . Not on file   Social History Narrative    Current Outpatient Prescriptions on File Prior to Visit  Medication Sig Dispense Refill  . brimonidine (ALPHAGAN) 0.15 % ophthalmic solution Place 1 drop into both eyes 3 (three) times daily.    . nitroGLYCERIN (NITROLINGUAL) 0.4 MG/SPRAY spray Place 1 spray under the tongue every 5 (five) minutes x 3 doses as needed for chest pain.    Marland Kitchen oxybutynin (DITROPAN XL) 5 MG 24 hr tablet Take one daily 90 tablet 1  . Rivaroxaban (XARELTO) 15 MG TABS tablet Take 1 tablet (15 mg total) by mouth daily with supper. 30 tablet 11   No current facility-administered medications on file prior to visit.    No Known Allergies  Review of Systems  Review of Systems  Constitutional: Negative for fever, chills and malaise/fatigue.  HENT: Negative for congestion and hearing loss.   Eyes: Negative for discharge.  Respiratory: Negative for cough, sputum production and shortness of breath.   Cardiovascular: Negative for chest pain, palpitations and leg swelling.  Gastrointestinal: Negative for heartburn, nausea, vomiting, abdominal pain, diarrhea, constipation and blood in stool.  Genitourinary: Negative for dysuria, urgency, frequency and hematuria.  Musculoskeletal: Positive for myalgias and neck pain. Negative for back pain and falls.  Skin: Negative for rash.  Neurological: Negative for dizziness, sensory change, loss of consciousness, weakness and headaches.  Endo/Heme/Allergies: Negative for environmental allergies. Does not bruise/bleed easily.  Psychiatric/Behavioral: Negative for depression and suicidal ideas.  The patient is not nervous/anxious and does not have insomnia.     Objective  BP 134/66 mmHg  Pulse 65  Temp(Src) 98.6 F (37 C) (Oral)  Ht 5\' 1"  (1.549 m)  Wt 146 lb 6 oz (66.395 kg)  BMI 27.67 kg/m2  SpO2 96%  Physical Exam  Physical Exam  Constitutional: She is oriented to person, place, and time and well-developed, well-nourished, and in no distress. No distress.  HENT:  Head: Normocephalic and atraumatic.  Right Ear: External ear normal.  Left Ear: External ear normal.  Nose: Nose normal.  Mouth/Throat: Oropharynx is clear and moist. No oropharyngeal exudate.  Eyes: Conjunctivae are normal. Pupils are equal, round, and reactive to light. Right eye exhibits no discharge. Left eye exhibits no discharge. No scleral icterus.  Neck: Normal range of motion. Neck supple. Thyromegaly present.  Cardiovascular: Normal rate, regular rhythm and intact distal pulses.   Murmur heard. Pulmonary/Chest: Effort normal and breath sounds normal. No respiratory distress. She has no wheezes. She has no rales.  Abdominal: Soft. Bowel sounds are normal. She exhibits no distension and no mass. There is no tenderness.  Musculoskeletal: Normal range of motion. She exhibits no edema or tenderness.  Lymphadenopathy:    She has cervical adenopathy.  Neurological: She is alert and oriented to person, place, and time. She has normal reflexes. No cranial nerve deficit. Coordination normal.  Skin: Skin is warm and dry. No rash noted. She is not diaphoretic.  Psychiatric: Mood, memory and affect normal.       Assessment & Plan  Multinodular goiter Patient notes 3-4 months of increasing neck pain, imaging confirms very enlarged thyroid, referred to general surgery for further consideration.  HTN (hypertension) Well controlled, no changes to meds. Encouraged heart healthy diet such as the DASH diet and exercise as tolerated.   Hyperlipidemia Encouraged heart healthy diet, increase exercise, avoid  trans fats, consider a krill oil cap daily  Renal insufficiency No NSAIDs, maintain adequate hydration. monitor  Anemia Resolved, Increase leafy greens, consider increased lean red meat and using cast iron cookware. Continue to monitor, report any concerns     Flu shot given

## 2015-02-03 ENCOUNTER — Telehealth: Payer: Self-pay | Admitting: Family Medicine

## 2015-02-03 DIAGNOSIS — I10 Essential (primary) hypertension: Secondary | ICD-10-CM

## 2015-02-03 DIAGNOSIS — E079 Disorder of thyroid, unspecified: Secondary | ICD-10-CM

## 2015-02-03 DIAGNOSIS — E785 Hyperlipidemia, unspecified: Secondary | ICD-10-CM

## 2015-02-03 NOTE — Telephone Encounter (Signed)
Labs entered per PCP instructions from results note.

## 2015-02-04 ENCOUNTER — Other Ambulatory Visit (INDEPENDENT_AMBULATORY_CARE_PROVIDER_SITE_OTHER): Payer: Medicare Other

## 2015-02-04 DIAGNOSIS — E785 Hyperlipidemia, unspecified: Secondary | ICD-10-CM

## 2015-02-04 DIAGNOSIS — I10 Essential (primary) hypertension: Secondary | ICD-10-CM | POA: Diagnosis not present

## 2015-02-04 DIAGNOSIS — E079 Disorder of thyroid, unspecified: Secondary | ICD-10-CM

## 2015-02-04 LAB — LIPID PANEL
CHOLESTEROL: 129 mg/dL (ref 0–200)
HDL: 66.3 mg/dL (ref >39.00)
LDL Cholesterol: 42 mg/dL (ref 0–99)
NonHDL: 62.46
Total CHOL/HDL Ratio: 2
Triglycerides: 101 mg/dL (ref 0.0–149.0)
VLDL: 20.2 mg/dL (ref 0.0–40.0)

## 2015-02-04 LAB — COMPREHENSIVE METABOLIC PANEL
ALBUMIN: 3.9 g/dL (ref 3.5–5.2)
ALK PHOS: 49 U/L (ref 39–117)
ALT: 15 U/L (ref 0–35)
AST: 18 U/L (ref 0–37)
BILIRUBIN TOTAL: 0.6 mg/dL (ref 0.2–1.2)
BUN: 20 mg/dL (ref 6–23)
CO2: 28 mEq/L (ref 19–32)
Calcium: 9.9 mg/dL (ref 8.4–10.5)
Chloride: 101 mEq/L (ref 96–112)
Creatinine, Ser: 1.25 mg/dL — ABNORMAL HIGH (ref 0.40–1.20)
GFR: 52.02 mL/min — ABNORMAL LOW (ref >60.00)
GLUCOSE: 128 mg/dL — AB (ref 70–99)
POTASSIUM: 4.1 meq/L (ref 3.5–5.1)
SODIUM: 137 meq/L (ref 135–145)
TOTAL PROTEIN: 7.3 g/dL (ref 6.0–8.3)

## 2015-02-04 LAB — T4, FREE: FREE T4: 0.89 ng/dL (ref 0.60–1.60)

## 2015-02-04 LAB — TSH: TSH: 0.92 u[IU]/mL (ref 0.35–4.50)

## 2015-02-05 ENCOUNTER — Telehealth: Payer: Self-pay

## 2015-02-05 ENCOUNTER — Other Ambulatory Visit: Payer: Self-pay | Admitting: Surgery

## 2015-02-05 DIAGNOSIS — E079 Disorder of thyroid, unspecified: Secondary | ICD-10-CM

## 2015-02-05 NOTE — Telephone Encounter (Signed)
-----   Message from Mosie Lukes, MD sent at 02/04/2015 11:22 PM EDT ----- Labs mostly look good, no new concerns

## 2015-02-05 NOTE — Telephone Encounter (Signed)
Pt daughter notified of results. No questions or concerns at this time

## 2015-02-06 ENCOUNTER — Telehealth: Payer: Self-pay | Admitting: Cardiology

## 2015-02-06 NOTE — Telephone Encounter (Signed)
Received records from [facility] for appointment on 02/26/15 with Dr Stanford Breed.  Records given to [facility] (medical records) for Dr Jacalyn Lefevre schedule on 02/26/15. lp

## 2015-02-07 ENCOUNTER — Ambulatory Visit (HOSPITAL_BASED_OUTPATIENT_CLINIC_OR_DEPARTMENT_OTHER)
Admission: RE | Admit: 2015-02-07 | Discharge: 2015-02-07 | Disposition: A | Discharge status: Home / Self Care | Payer: Medicare Other | Source: Ambulatory Visit | Attending: Surgery | Admitting: Surgery

## 2015-02-07 DIAGNOSIS — E010 Iodine-deficiency related diffuse (endemic) goiter: Secondary | ICD-10-CM | POA: Insufficient documentation

## 2015-02-07 DIAGNOSIS — E042 Nontoxic multinodular goiter: Secondary | ICD-10-CM | POA: Insufficient documentation

## 2015-02-07 DIAGNOSIS — R221 Localized swelling, mass and lump, neck: Secondary | ICD-10-CM | POA: Diagnosis present

## 2015-02-24 NOTE — Progress Notes (Signed)
      HPI: FU hypertension, patent foramen ovale and prior TIA. Patient previously lived in [place]. Echocardiogram in November 2014 showed normal LV function, mild to moderate left ventricular hypertrophy, grade 2 diastolic dysfunction, aneurysmal atrial septum with PFO. There was moderate atherosclerosis in the aortic arch. She has a history of TIAs; Initially treated with Plavix and aspirin but recurrence and now on xarelto. CT 9/16 showed thyromegaly and coronary calcification. Patient also is scheduled for thyroidectomy and cardiology is asked to evaluate preoperatively. Since last seen, She denies dyspnea on exertion but has limited mobility. No orthopnea, PND, pedal edema, chest pain or syncope.  Current Outpatient Prescriptions  Medication Sig Dispense Refill  . atorvastatin (LIPITOR) 40 MG tablet Take 1 tablet (40 mg total) by mouth daily. 30 tablet 6  . brimonidine (ALPHAGAN) 0.15 % ophthalmic solution Place 1 drop into both eyes 3 (three) times daily.    . carvedilol (COREG) 3.125 MG tablet Take 2 tablets bid 120 tablet 5  . nitroGLYCERIN (NITROLINGUAL) 0.4 MG/SPRAY spray Place 1 spray under the tongue every 5 (five) minutes x 3 doses as needed for chest pain.    Marland Kitchen oxybutynin (DITROPAN XL) 5 MG 24 hr tablet Take one daily 90 tablet 1  . Rivaroxaban (XARELTO) 15 MG TABS tablet Take 1 tablet (15 mg total) by mouth daily with supper. 30 tablet 11  . triamterene-hydrochlorothiazide (MAXZIDE-25) 37.5-25 MG per tablet Take one every am 30 tablet 5   No current facility-administered medications for this visit.     Past Medical History  Diagnosis Date  . Arthritis   . Hypertension   . Hyperlipidemia   . Thyroid nodule     Biopsy-negative  . Urinary incontinence   . Renal insufficiency   . Patent foramen ovale   . TIA (transient ischemic attack)   . History of chicken pox   . H/O measles   . H/O mumps     Past Surgical History  Procedure Laterality Date  . Joint replacement     . Replacement total knee Right 2007  . Trigger finger release  2007  . Appendectomy    . Abdominal hysterectomy      fibroid    Social History   Social History  . Marital Status: Single    Spouse Name: N/A  . Number of Children: 4  . Years of Education: N/A   Occupational History  . Not on file.   Social History Main Topics  . Smoking status: Never Smoker   . Smokeless tobacco: Never Used  . Alcohol Use: No  . Drug Use: No  . Sexual Activity:    Partners: Male     Comment: no dietary restrictions, lives with 2 daughters   Other Topics Concern  . Not on file   Social History Narrative    ROS: no fevers or chills, productive cough, hemoptysis, dysphasia, odynophagia, melena, hematochezia, dysuria, hematuria, rash, seizure activity, orthopnea, PND, pedal edema, claudication. Remaining systems are negative.  Physical Exam: Well-developed well-nourished in no acute distress.  Skin is warm and dry.  HEENT is normal.  Neck is supple.  Chest is clear to auscultation with normal expansion.  Cardiovascular exam is regular rate and rhythm.  Abdominal exam nontender or distended. No masses palpated. Extremities show no edema. neuro grossly intact  ECG Sinus rhythm at a rate of 67. No ST changes. Occasional PAC.

## 2015-02-26 ENCOUNTER — Telehealth (HOSPITAL_COMMUNITY): Payer: Self-pay | Admitting: *Deleted

## 2015-02-26 ENCOUNTER — Encounter: Payer: Self-pay | Admitting: Cardiology

## 2015-02-26 ENCOUNTER — Ambulatory Visit (INDEPENDENT_AMBULATORY_CARE_PROVIDER_SITE_OTHER): Payer: Medicare Other | Admitting: Cardiology

## 2015-02-26 VITALS — BP 150/83 | HR 67 | Ht 61.0 in | Wt 146.8 lb

## 2015-02-26 DIAGNOSIS — Z0000 Encounter for general adult medical examination without abnormal findings: Secondary | ICD-10-CM

## 2015-02-26 DIAGNOSIS — E785 Hyperlipidemia, unspecified: Secondary | ICD-10-CM | POA: Diagnosis not present

## 2015-02-26 DIAGNOSIS — Z01818 Encounter for other preprocedural examination: Secondary | ICD-10-CM

## 2015-02-26 DIAGNOSIS — I10 Essential (primary) hypertension: Secondary | ICD-10-CM | POA: Diagnosis not present

## 2015-02-26 HISTORY — DX: Encounter for general adult medical examination without abnormal findings: Z00.00

## 2015-02-26 NOTE — Assessment & Plan Note (Signed)
Continue statin. 

## 2015-02-26 NOTE — Patient Instructions (Signed)
Your physician has requested that you have a lexiscan myoview. For further information please visit [email or web address]. Please follow instruction sheet, as given.   Your physician wants you to follow-up in: [place] will receive a reminder letter in the mail two months in advance. If you don't receive a letter, please call our office to schedule the follow-up appointment.

## 2015-02-26 NOTE — Assessment & Plan Note (Signed)
Patient is scheduled for thyroidectomy. She has limited mobility because of arthralgias and requires a walker. Functional capacity therefore cannot be assessed. She is noted to have three-vessel coronary artery calcification on recent CT. Plan Lexiscan nuclear study for risk stratification preoperatively.

## 2015-02-26 NOTE — Assessment & Plan Note (Signed)
Blood pressure elevated.However typically controlled. Continue present medications and adjust based on follow-up readings.

## 2015-02-26 NOTE — Assessment & Plan Note (Signed)
In reviewing her outside records previously the patient has had TIAs in the setting of patent foramen ovale and atrial septal aneurysm. She had a repeat TIA despite aspirin and Plavix. She was therefore anticoagulated. Continue xarelto.

## 2015-02-27 ENCOUNTER — Ambulatory Visit (HOSPITAL_COMMUNITY)
Admission: RE | Admit: 2015-02-27 | Discharge: 2015-02-27 | Disposition: A | Discharge status: Home / Self Care | Payer: Medicare Other | Source: Ambulatory Visit | Attending: Cardiology | Admitting: Cardiology

## 2015-02-27 DIAGNOSIS — I252 Old myocardial infarction: Secondary | ICD-10-CM | POA: Diagnosis not present

## 2015-02-27 DIAGNOSIS — I10 Essential (primary) hypertension: Secondary | ICD-10-CM | POA: Diagnosis not present

## 2015-02-27 DIAGNOSIS — Z01818 Encounter for other preprocedural examination: Secondary | ICD-10-CM | POA: Insufficient documentation

## 2015-02-27 DIAGNOSIS — Z8249 Family history of ischemic heart disease and other diseases of the circulatory system: Secondary | ICD-10-CM | POA: Diagnosis not present

## 2015-02-27 LAB — MYOCARDIAL PERFUSION IMAGING
CHL CUP NUCLEAR SDS: 0
CHL CUP RESTING HR STRESS: 69 {beats}/min
CSEPPHR: 98 {beats}/min
LVDIAVOL: 64 mL
LVSYSVOL: 16 mL
NUC STRESS TID: 1.18
SRS: 0
SSS: 0

## 2015-02-27 MED ORDER — TECHNETIUM TC 99M SESTAMIBI GENERIC - CARDIOLITE: 10.8000 | Freq: Once | INTRAVENOUS | Status: DC | PRN

## 2015-02-27 MED ORDER — TECHNETIUM TC 99M SESTAMIBI GENERIC - CARDIOLITE: 32.5000 | Freq: Once | INTRAVENOUS | Status: DC | PRN

## 2015-02-27 MED ORDER — REGADENOSON 0.4 MG/5ML IV SOLN: 0.4000 mg | Freq: Once | INTRAVENOUS | Status: DC

## 2015-02-27 NOTE — Telephone Encounter (Signed)
Encounter complete. 

## 2015-03-04 ENCOUNTER — Telehealth: Payer: Self-pay | Admitting: Cardiology

## 2015-03-04 NOTE — Telephone Encounter (Signed)
Office is currently closed

## 2015-03-04 NOTE — Telephone Encounter (Signed)
She would like to know the status of his clarence.

## 2015-03-05 NOTE — Telephone Encounter (Signed)
discussed with dr Stanford Breed, pt had a low risk myoview. She is low risk cardiac wise for surgery. This note will be faxed to [place] @ [phone].

## 2015-03-17 ENCOUNTER — Ambulatory Visit: Payer: Self-pay | Admitting: Surgery

## 2015-04-02 ENCOUNTER — Telehealth: Payer: Self-pay | Admitting: Family Medicine

## 2015-04-02 NOTE — Telephone Encounter (Signed)
Caller name: Myrtice Lauth Relation to TZ:[id number] Call back [phone] Pharmacy:  Reason for call: daughter states pt seen Dr. Harlow Asa about 6 weeks ago, was informed that pt would have to have surgery however pt has not been scheduled yet, daughter states she has contacted the office on serveral occasions and they informed her someone would call them back, has never received a call back from anyone and wants to know if Dr. Charlett Blake can check on this for them. States she can speak directly with her sister who is home with her mom, Holly Nunez at the above number. States pt is 81 years old and is getting very anxious because she knows she has to have surgery and wants to know why its taking so long to get an appt.

## 2015-04-03 NOTE — Telephone Encounter (Signed)
Call Dr Arther Abbott office and ask if they are planning to do surgery on this patient then let family know. They may have decided she is not a good surgical candidate

## 2015-04-04 NOTE — Telephone Encounter (Signed)
Called Dr. Jobe Marker office and evidently the surgical scheduler at that office has quit and the administrator is doing the scheduling and very backed up.  I did leave a detailed message with Hassan Rowan the administrator with patients information and to please call the family/or PCP's office to notify of surgery date.  Did call the family and gave all the above information and they have contacted twice already and will do so again, at least now have a contact name.  They will contact our office if they need further help.

## 2015-04-18 ENCOUNTER — Encounter (HOSPITAL_COMMUNITY)
Admission: RE | Admit: 2015-04-18 | Discharge: 2015-04-18 | Disposition: A | Discharge status: Home / Self Care | Payer: Medicare Other | Source: Ambulatory Visit | Attending: Anesthesiology | Admitting: Anesthesiology

## 2015-04-18 ENCOUNTER — Encounter (HOSPITAL_COMMUNITY)
Admission: RE | Admit: 2015-04-18 | Discharge: 2015-04-18 | Disposition: A | Discharge status: Home / Self Care | Payer: Medicare Other | Source: Ambulatory Visit | Attending: Surgery | Admitting: Surgery

## 2015-04-18 ENCOUNTER — Encounter (HOSPITAL_COMMUNITY): Payer: Self-pay

## 2015-04-18 ENCOUNTER — Telehealth: Payer: Self-pay | Admitting: Cardiology

## 2015-04-18 DIAGNOSIS — E079 Disorder of thyroid, unspecified: Secondary | ICD-10-CM | POA: Insufficient documentation

## 2015-04-18 DIAGNOSIS — Z8673 Personal history of transient ischemic attack (TIA), and cerebral infarction without residual deficits: Secondary | ICD-10-CM | POA: Diagnosis not present

## 2015-04-18 DIAGNOSIS — Z01812 Encounter for preprocedural laboratory examination: Secondary | ICD-10-CM | POA: Insufficient documentation

## 2015-04-18 DIAGNOSIS — Z01818 Encounter for other preprocedural examination: Secondary | ICD-10-CM

## 2015-04-18 DIAGNOSIS — Q211 Atrial septal defect: Secondary | ICD-10-CM | POA: Insufficient documentation

## 2015-04-18 LAB — CBC
HEMATOCRIT: 36 % (ref 36.0–46.0)
HEMOGLOBIN: 12.2 g/dL (ref 12.0–15.0)
MCH: 32.4 pg (ref 26.0–34.0)
MCHC: 33.9 g/dL (ref 30.0–36.0)
MCV: 95.5 fL (ref 78.0–100.0)
Platelets: 182 10*3/uL (ref 150–400)
RBC: 3.77 MIL/uL — AB (ref 3.87–5.11)
RDW: 13.2 % (ref 11.5–15.5)
WBC: 6.6 10*3/uL (ref 4.0–10.5)

## 2015-04-18 LAB — BASIC METABOLIC PANEL
ANION GAP: 9 (ref 5–15)
BUN: 25 mg/dL — ABNORMAL HIGH (ref 6–20)
CO2: 25 mmol/L (ref 22–32)
Calcium: 9.9 mg/dL (ref 8.9–10.3)
Chloride: 103 mmol/L (ref 101–111)
Creatinine, Ser: 1.28 mg/dL — ABNORMAL HIGH (ref 0.44–1.00)
GFR calc Af Amer: 42 mL/min — ABNORMAL LOW (ref >60)
GFR, EST NON AFRICAN AMERICAN: 36 mL/min — AB (ref >60)
GLUCOSE: 101 mg/dL — AB (ref 65–99)
POTASSIUM: 4.1 mmol/L (ref 3.5–5.1)
Sodium: 137 mmol/L (ref 135–145)

## 2015-04-18 NOTE — Pre-Procedure Instructions (Addendum)
    Holly Nunez  04/18/2015      [id number] [facility] [place] [place] [id number] [place] [id number] Phone: [phone] Fax: [phone]    Your procedure is scheduled on April 25, 2015.  Report to [facility] Admitting at 9 A.M.  Call this number if you have problems the morning of surgery:  [phone]   Remember:  Do not eat food or drink liquids after midnight.  Take these medicines the morning of surgery with A SIP OF WATER :carvedilol (COREG),  oxybutynin (DITROPAN XL), brimonidine (ALPHAGAN) , acetaminophen (TYLENOL) if needed   XARELTO AS DIRECTED   Do not wear jewelry, make-up or nail polish.  Do not wear lotions, powders, or perfumes.  You may wear deodorant.  Do not shave 48 hours prior to surgery.    Do not bring valuables to the hospital.  [facility] is not responsible for any belongings or valuables.  Contacts, dentures or bridgework may not be worn into surgery.  Leave your suitcase in the car.  After surgery it may be brought to your room.  For patients admitted to the hospital, discharge time will be determined by your treatment team.  Patients discharged the day of surgery will not be allowed to drive home.   Name and phone number of your driver:    Special instructions:  "PREPARING FOR SURGERY"  Please read over the following fact sheets that you were given. Pain Booklet, Coughing and Deep Breathing and Surgical Site Infection Prevention

## 2015-04-18 NOTE — Telephone Encounter (Signed)
Pt's daughter is calling in stating that the pt will be having thyroid surgery on 12/9 and the physician needs to know how long she needs to hold her Xarelto. Please f/u with pt  Thanks

## 2015-04-18 NOTE — Telephone Encounter (Signed)
Spoke with pt dtr, aware per dr Stanford Breed, pt can stop xarelto for 3 days prior to surgery.

## 2015-04-21 NOTE — Progress Notes (Addendum)
Anesthesia Chart Review:  Pt is 81 year old female scheduled for L thyroid lobectomy on 04/25/2015 with Dr. Harlow Asa.   Cardiologist is Dr. Kirk Ruths.   PMH includes:  HTN, hyperlipidemia, TIA, PFO, renal insufficiency. Never smoker. BMI 28.   Medications include: lipitor, carvedilol, xarelto, triamterene-hctz. Pt to stop xarelto 2 days prior to surgery.   Preoperative labs reviewed.    Chest x-ray 04/18/15 reviewed. There is no active cardiopulmonary disease. There is deviation of the trachea toward the right due to the large left thyroid lobe.  EKG 02/26/15: sinus rhythm with PACs.   Nuclear stress test 02/27/15:  - Normal lexiscan nuclear stress test demonstrating normal myocardial perfusion and function: EF 75%.  Echo 03/28/13:  - Mild-moderate concentric LVH. Grade II diastolic dysfunction. EF >70%.  - atrial septum is aneurysmal - a patent foramen ovale is present - Estimated RVSP = 29 mmHg, high normal - Moderate atherosclerotic plaques in aortic arch.    Pt has cardiac clearance from Dr. Stanford Breed in [place] telephone encounter dated 03/05/15.   If no changes, I anticipate pt can proceed with surgery as scheduled.   Willeen Cass, FNP-BC [facility]/Anesthesiology Phone: [phone] 04/21/2015 1:17 PM

## 2015-04-24 ENCOUNTER — Encounter (HOSPITAL_COMMUNITY): Payer: Self-pay | Admitting: Surgery

## 2015-04-24 MED ORDER — CEFAZOLIN SODIUM-DEXTROSE 2-3 GM-% IV SOLR
2.0000 g | INTRAVENOUS | Status: AC
  Administered 2015-04-25: 2 g via INTRAVENOUS
  Filled 2015-04-24: qty 50

## 2015-04-24 NOTE — H&P (Signed)
General Surgery [facility]  Holly Nunez DOB: 1924-06-19 Single / Language: Undefined / Race: Undefined Female  History of Present Illness The patient is a 81 year old female who presents with a complaint of Enlarged thyroid. Patient is referred by Dr. Willette Alma for evaluation of thyroid mass with neck discomfort. Patient has a long-standing enlarged thyroid which was diagnosed approximately one year ago in [place] when she presented with neck pain. Patient apparently underwent diagnostic studies followed by a percutaneous biopsy in [place] which was negative for malignancy by report. Patient reports that she has taken thyroid hormone in the past, presumably for suppression. She has not been on thyroid hormone in many years. Patient underwent a CT scan of the neck and chest to evaluate left-sided neck pain. Cervical spine CT scan on February 03, 2015 shows a large left thyroid mass measuring 6 x 5 x 4 cm containing coarse calcifications centrally. This extends into the anterior mediastinum inferiorly and displaces the trachea to the right. Nodules are also noted in the right thyroid lobe. Patient has significant degenerative and osteoarthritic changes in the cervical spine which may explain her pain. Patient has had no prior surgery on the head or neck. There is a family history of thyroid cancer and the patient's daughter who was treated with surgery and radioactive iodine. There is no other family history of endocrine neoplasm. Patient denies any dysphagia. She denies tremors or palpitations. Patient does have an unknown cardiac problem for which she takes anticoagulation. She will require cardiac evaluation and clearance prior to any surgical intervention.  Other Problems Arthritis Bladder Problems High blood pressure Hypercholesterolemia Vascular Disease  Past Surgical History Hysterectomy (not due to cancer) - Complete Knee Surgery  Right.  Diagnostic Studies History Colonoscopy 1-5 years ago Mammogram 1-3 years ago Pap Smear >5 years ago  Allergies No Known Drug Allergies09/21/2016  Medication History Atorvastatin Calcium (40MG  Tablet, Oral) Active. Carvedilol (3.125MG  Tablet, Oral) Active. Oxybutynin Chloride ER (5MG  Tablet ER 24HR, Oral) Active. Triamterene-HCTZ (37.5-25MG  Tablet, Oral) Active. Xarelto (15MG  Tablet, Oral) Active. Nitroglycerin (0.4MG Dorita Fray Solution, Translingual) Active. Brimonidine Tartrate (0.15% Solution, Ophthalmic) Active. Medications Reconciled  Social History  No alcohol use No drug use Tobacco use Never smoker.  Family History Arthritis Brother, Daughter, Father. Cancer Daughter, Sister. Cervical Cancer Sister. Colon Polyps Daughter. Heart Disease Brother, Daughter, Father, Mother. Heart disease in female family member before age 84 Hypertension Brother, Daughter, Father, Mother, Son. Malignant Neoplasm Of Pancreas Daughter. Melanoma Sister. Migraine Headache Daughter. Prostate Cancer Brother. Thyroid problems Daughter.  Pregnancy / Birth History Age at menarche 79 years. Age of menopause <47 Gravida 5 Maternal age 2-20 Para 5    Review of Systems General Present- Fatigue. Not Present- Appetite Loss, Chills, Fever, Night Sweats, Weight Gain and Weight Loss. Skin Not Present- Change in Wart/Mole, Dryness, Hives, Jaundice, New Lesions, Non-Healing Wounds, Rash and Ulcer. HEENT Present- Wears glasses/contact lenses. Not Present- Earache, Hearing Loss, Hoarseness, Nose Bleed, Oral Ulcers, Ringing in the Ears, Seasonal Allergies, Sinus Pain, Sore Throat, Visual Disturbances and Yellow Eyes. Respiratory Not Present- Bloody sputum, Chronic Cough, Difficulty Breathing, Snoring and Wheezing. Breast Not Present- Breast Mass, Breast Pain, Nipple Discharge and Skin Changes. Cardiovascular Present- Leg Cramps. Not Present- Chest Pain,  Difficulty Breathing Lying Down, Palpitations, Rapid Heart Rate, Shortness of Breath and Swelling of Extremities. Gastrointestinal Present- Indigestion. Not Present- Abdominal Pain, Bloating, Bloody Stool, Change in Bowel Habits, Chronic diarrhea, Constipation, Difficulty Swallowing, Excessive gas, Gets full quickly at meals, Hemorrhoids,  Nausea, Rectal Pain and Vomiting. Female Genitourinary Present- Urgency. Not Present- Frequency, Nocturia, Painful Urination and Pelvic Pain. Musculoskeletal Present- Joint Pain. Not Present- Back Pain, Joint Stiffness, Muscle Pain, Muscle Weakness and Swelling of Extremities. Neurological Not Present- Decreased Memory, Fainting, Headaches, Numbness, Seizures, Tingling, Tremor, Trouble walking and Weakness. Psychiatric Present- Depression. Not Present- Anxiety, Bipolar, Change in Sleep Pattern, Fearful and Frequent crying. Endocrine Present- Cold Intolerance. Not Present- Excessive Hunger, Hair Changes, Heat Intolerance, Hot flashes and New Diabetes. Hematology Not Present- Easy Bruising, Excessive bleeding, Gland problems, HIV and Persistent Infections.  Vitals  Weight: 142 lb Height: 60in Body Surface Area: 1.65 m Body Mass Index: 27.73 kg/m  Temp.: 98.69F(Temporal)  Pulse: 80 (Regular)  BP: 122/74 (Sitting, Left Arm, Standard)  Physical Exam  General - appears comfortable, no distress; not diaphorectic  HEENT - normocephalic; sclerae clear, gaze conjugate; mucous membranes moist, dentition fair; voice normal  Neck - asymmetric on extension; no palpable anterior or posterior cervical adenopathy; right thyroid lobe without dominant or discrete mass; left thyroid lobe with a large relatively firm mass extending well beneath the left clavicle, mobile with swallowing, nontender  Chest - clear bilaterally without rhonchi, rales, or wheeze  Cor - regular rhythm with normal rate; no significant murmur  Ext - non-tender without significant edema  or lymphedema  Neuro - grossly intact; no tremor   Assessment & Plan  THYROID MASS (E07.9)  Patient is referred with a large left-sided thyroid mass causing discomfort and displacement of the trachea. I have provided the patient and her family with written literature on thyroid surgery to review at home.  CT scan results are reviewed in detail with the patient and family. Patient has not had an ultrasound examination performed in our health system. I would like to have an ultrasound to better evaluate the right thyroid lobe and assist with the decision of whether to perform left thyroid lobectomy or to proceed with total thyroidectomy. Therefore we will obtain a thyroid ultrasound in the near future. Once I have reviewed the results, I will contact the patient and her family to make a final decision regarding thyroid lobectomy versus total thyroidectomy.  I reviewed the procedure of total thyroidectomy and thyroid lobectomy with the patient and her daughters. We discussed the procedure, the potential complications including recurrent laryngeal nerve injury and injury to parathyroid glands. We discussed the hospital stay to be anticipated. We discussed her postoperative recovery. We discussed the likely need for lifelong thyroid hormone replacement. They understand and wish to proceed in the near future.  The risks and benefits of the procedure have been discussed at length with the patient.  The patient understands the proposed procedure, potential alternative treatments, and the course of recovery to be expected.  All of the patient's questions have been answered at this time.  The patient wishes to proceed with surgery.  Earnstine Regal, MD, [facility] Office: [phone]

## 2015-04-25 ENCOUNTER — Encounter (HOSPITAL_COMMUNITY): Payer: Self-pay | Admitting: Surgery

## 2015-04-25 ENCOUNTER — Ambulatory Visit (HOSPITAL_COMMUNITY): Payer: Medicare Other | Admitting: Emergency Medicine

## 2015-04-25 ENCOUNTER — Observation Stay (HOSPITAL_COMMUNITY)
Admission: RE | Admit: 2015-04-25 | Discharge: 2015-04-26 | Disposition: A | Discharge status: Home / Self Care | Payer: Medicare Other | Source: Ambulatory Visit | Attending: Surgery | Admitting: Surgery

## 2015-04-25 ENCOUNTER — Ambulatory Visit (HOSPITAL_COMMUNITY): Payer: Medicare Other | Admitting: Anesthesiology

## 2015-04-25 ENCOUNTER — Encounter (HOSPITAL_COMMUNITY)
Admission: RE | Disposition: A | Discharge status: Home / Self Care | Payer: Self-pay | Source: Ambulatory Visit | Attending: Surgery

## 2015-04-25 DIAGNOSIS — D34 Benign neoplasm of thyroid gland: Principal | ICD-10-CM | POA: Insufficient documentation

## 2015-04-25 DIAGNOSIS — Z90710 Acquired absence of both cervix and uterus: Secondary | ICD-10-CM | POA: Diagnosis not present

## 2015-04-25 DIAGNOSIS — Z7901 Long term (current) use of anticoagulants: Secondary | ICD-10-CM | POA: Diagnosis not present

## 2015-04-25 DIAGNOSIS — Z79899 Other long term (current) drug therapy: Secondary | ICD-10-CM | POA: Diagnosis not present

## 2015-04-25 DIAGNOSIS — M1990 Unspecified osteoarthritis, unspecified site: Secondary | ICD-10-CM | POA: Diagnosis not present

## 2015-04-25 DIAGNOSIS — E042 Nontoxic multinodular goiter: Secondary | ICD-10-CM | POA: Diagnosis present

## 2015-04-25 DIAGNOSIS — I10 Essential (primary) hypertension: Secondary | ICD-10-CM | POA: Insufficient documentation

## 2015-04-25 DIAGNOSIS — D497 Neoplasm of unspecified behavior of endocrine glands and other parts of nervous system: Secondary | ICD-10-CM | POA: Diagnosis not present

## 2015-04-25 HISTORY — PX: THYROID LOBECTOMY: SHX420

## 2015-04-25 LAB — PROTIME-INR
INR: 1.13 (ref 0.00–1.49)
PROTHROMBIN TIME: 14.7 s (ref 11.6–15.2)

## 2015-04-25 SURGERY — LOBECTOMY, THYROID
Anesthesia: General | Site: Neck | Laterality: Left

## 2015-04-25 MED ORDER — LACTATED RINGERS IV SOLN
INTRAVENOUS | Status: DC | PRN
  Administered 2015-04-25: 12:00:00 via INTRAVENOUS

## 2015-04-25 MED ORDER — TRIAMTERENE-HCTZ 37.5-25 MG PO TABS
1.0000 | ORAL_TABLET | Freq: Every day | ORAL | Status: DC
  Administered 2015-04-25 – 2015-04-26 (×2): 1 via ORAL
  Filled 2015-04-25 (×3): qty 1

## 2015-04-25 MED ORDER — FENTANYL CITRATE (PF) 250 MCG/5ML IJ SOLN
INTRAMUSCULAR | Status: AC
  Filled 2015-04-25: qty 5

## 2015-04-25 MED ORDER — 0.9 % SODIUM CHLORIDE (POUR BTL) OPTIME
TOPICAL | Status: DC | PRN
  Administered 2015-04-25: 1000 mL

## 2015-04-25 MED ORDER — NEOSTIGMINE METHYLSULFATE 10 MG/10ML IV SOLN
INTRAVENOUS | Status: DC | PRN
  Administered 2015-04-25: 3 mg via INTRAVENOUS

## 2015-04-25 MED ORDER — HYDROMORPHONE HCL 1 MG/ML IJ SOLN
1.0000 mg | INTRAMUSCULAR | Status: DC | PRN
  Administered 2015-04-25: 1 mg via INTRAVENOUS
  Filled 2015-04-25: qty 1

## 2015-04-25 MED ORDER — FENTANYL CITRATE (PF) 100 MCG/2ML IJ SOLN
INTRAMUSCULAR | Status: DC | PRN
  Administered 2015-04-25: 25 ug via INTRAVENOUS
  Administered 2015-04-25: 100 ug via INTRAVENOUS

## 2015-04-25 MED ORDER — HYDROMORPHONE HCL 1 MG/ML IJ SOLN
0.2500 mg | INTRAMUSCULAR | Status: DC | PRN
Start: 2015-04-25 — End: 2015-04-25
  Administered 2015-04-25 (×3): 0.25 mg via INTRAVENOUS

## 2015-04-25 MED ORDER — ROCURONIUM BROMIDE 50 MG/5ML IV SOLN
INTRAVENOUS | Status: AC
  Filled 2015-04-25: qty 1

## 2015-04-25 MED ORDER — GLYCOPYRROLATE 0.2 MG/ML IJ SOLN
INTRAMUSCULAR | Status: AC
  Filled 2015-04-25: qty 3

## 2015-04-25 MED ORDER — ROCURONIUM BROMIDE 100 MG/10ML IV SOLN
INTRAVENOUS | Status: DC | PRN
  Administered 2015-04-25: 50 mg via INTRAVENOUS

## 2015-04-25 MED ORDER — KCL IN DEXTROSE-NACL 20-5-0.45 MEQ/L-%-% IV SOLN
INTRAVENOUS | Status: AC
  Filled 2015-04-25: qty 1000

## 2015-04-25 MED ORDER — PROPOFOL 10 MG/ML IV BOLUS
INTRAVENOUS | Status: DC | PRN
  Administered 2015-04-25: 150 mg via INTRAVENOUS

## 2015-04-25 MED ORDER — HYDROCODONE-ACETAMINOPHEN 5-325 MG PO TABS
1.0000 | ORAL_TABLET | ORAL | Status: DC | PRN
  Administered 2015-04-25 – 2015-04-26 (×2): 2 via ORAL
  Filled 2015-04-25 (×2): qty 2

## 2015-04-25 MED ORDER — ONDANSETRON 4 MG PO TBDP: 4.0000 mg | ORAL_TABLET | Freq: Four times a day (QID) | ORAL | Status: DC | PRN

## 2015-04-25 MED ORDER — OXYBUTYNIN CHLORIDE ER 5 MG PO TB24
5.0000 mg | ORAL_TABLET | Freq: Every day | ORAL | Status: DC
  Administered 2015-04-26: 5 mg via ORAL
  Filled 2015-04-25: qty 1

## 2015-04-25 MED ORDER — NEOSTIGMINE METHYLSULFATE 10 MG/10ML IV SOLN
INTRAVENOUS | Status: AC
  Filled 2015-04-25: qty 1

## 2015-04-25 MED ORDER — HYDROMORPHONE HCL 1 MG/ML IJ SOLN
INTRAMUSCULAR | Status: AC
  Filled 2015-04-25: qty 1

## 2015-04-25 MED ORDER — NITROGLYCERIN 0.4 MG SL SUBL: 0.4000 mg | SUBLINGUAL_TABLET | SUBLINGUAL | Status: DC | PRN

## 2015-04-25 MED ORDER — LIDOCAINE HCL (CARDIAC) 20 MG/ML IV SOLN
INTRAVENOUS | Status: DC | PRN
  Administered 2015-04-25: 100 mg via INTRAVENOUS

## 2015-04-25 MED ORDER — CARVEDILOL 6.25 MG PO TABS
6.2500 mg | ORAL_TABLET | Freq: Two times a day (BID) | ORAL | Status: DC
  Administered 2015-04-25 – 2015-04-26 (×2): 6.25 mg via ORAL
  Filled 2015-04-25 (×2): qty 1

## 2015-04-25 MED ORDER — ONDANSETRON HCL 4 MG/2ML IJ SOLN: 4.0000 mg | Freq: Four times a day (QID) | INTRAMUSCULAR | Status: DC | PRN

## 2015-04-25 MED ORDER — PHENOL 1.4 % MT LIQD
1.0000 | OROMUCOSAL | Status: DC | PRN
  Filled 2015-04-25: qty 177

## 2015-04-25 MED ORDER — ONDANSETRON HCL 4 MG/2ML IJ SOLN
INTRAMUSCULAR | Status: AC
  Filled 2015-04-25: qty 2

## 2015-04-25 MED ORDER — ACETAMINOPHEN 650 MG RE SUPP: 650.0000 mg | Freq: Four times a day (QID) | RECTAL | Status: DC | PRN

## 2015-04-25 MED ORDER — HEMOSTATIC AGENTS (NO CHARGE) OPTIME
TOPICAL | Status: DC | PRN
  Administered 2015-04-25: 1 via TOPICAL

## 2015-04-25 MED ORDER — ACETAMINOPHEN 325 MG PO TABS: 650.0000 mg | ORAL_TABLET | Freq: Four times a day (QID) | ORAL | Status: DC | PRN

## 2015-04-25 MED ORDER — LACTATED RINGERS IV SOLN
INTRAVENOUS | Status: DC
  Administered 2015-04-25: 10:00:00 via INTRAVENOUS

## 2015-04-25 MED ORDER — MEPERIDINE HCL 25 MG/ML IJ SOLN: 6.2500 mg | INTRAMUSCULAR | Status: DC | PRN

## 2015-04-25 MED ORDER — ONDANSETRON HCL 4 MG/2ML IJ SOLN: 4.0000 mg | Freq: Once | INTRAMUSCULAR | Status: DC | PRN

## 2015-04-25 MED ORDER — ONDANSETRON HCL 4 MG/2ML IJ SOLN
INTRAMUSCULAR | Status: DC | PRN
  Administered 2015-04-25: 4 mg via INTRAVENOUS

## 2015-04-25 MED ORDER — KCL IN DEXTROSE-NACL 20-5-0.45 MEQ/L-%-% IV SOLN
INTRAVENOUS | Status: DC
  Administered 2015-04-25: 1000 mL via INTRAVENOUS
  Administered 2015-04-26: 06:00:00 via INTRAVENOUS
  Filled 2015-04-25: qty 1000

## 2015-04-25 MED ORDER — EPHEDRINE SULFATE 50 MG/ML IJ SOLN
INTRAMUSCULAR | Status: DC | PRN
  Administered 2015-04-25: 10 mg via INTRAVENOUS

## 2015-04-25 MED ORDER — BRIMONIDINE TARTRATE 0.15 % OP SOLN
1.0000 [drp] | Freq: Three times a day (TID) | OPHTHALMIC | Status: DC
  Administered 2015-04-25 – 2015-04-26 (×3): 1 [drp] via OPHTHALMIC
  Filled 2015-04-25: qty 5

## 2015-04-25 MED ORDER — LIDOCAINE HCL (CARDIAC) 20 MG/ML IV SOLN
INTRAVENOUS | Status: AC
  Filled 2015-04-25: qty 5

## 2015-04-25 SURGICAL SUPPLY — 52 items
ATTRACTOMAT 16X20 MAGNETIC DRP (DRAPES) ×3 IMPLANT
BENZOIN TINCTURE PRP APPL 2/3 (GAUZE/BANDAGES/DRESSINGS) ×6 IMPLANT
BLADE SURG 10 STRL SS (BLADE) ×3 IMPLANT
BLADE SURG 15 STRL LF DISP TIS (BLADE) ×1 IMPLANT
BLADE SURG 15 STRL SS (BLADE) ×2
BLADE SURG ROTATE 9660 (MISCELLANEOUS) IMPLANT
CANISTER SUCTION 2500CC (MISCELLANEOUS) ×3 IMPLANT
CHLORAPREP W/TINT 10.5 ML (MISCELLANEOUS) ×3 IMPLANT
CLIP TI MEDIUM 24 (CLIP) ×3 IMPLANT
CLIP TI WIDE RED SMALL 24 (CLIP) ×3 IMPLANT
CLOSURE WOUND 1/2 X4 (GAUZE/BANDAGES/DRESSINGS) ×1
CONT SPEC 4OZ CLIKSEAL STRL BL (MISCELLANEOUS) IMPLANT
COVER SURGICAL LIGHT HANDLE (MISCELLANEOUS) ×3 IMPLANT
CRADLE DONUT ADULT HEAD (MISCELLANEOUS) ×3 IMPLANT
DRAPE PED LAPAROTOMY (DRAPES) ×3 IMPLANT
DRAPE UTILITY XL STRL (DRAPES) ×3 IMPLANT
ELECT CAUTERY BLADE 6.4 (BLADE) ×3 IMPLANT
ELECT REM PT RETURN 9FT ADLT (ELECTROSURGICAL) ×3
ELECTRODE REM PT RTRN 9FT ADLT (ELECTROSURGICAL) ×1 IMPLANT
GAUZE SPONGE 4X4 12PLY STRL (GAUZE/BANDAGES/DRESSINGS) ×3 IMPLANT
GAUZE SPONGE 4X4 16PLY XRAY LF (GAUZE/BANDAGES/DRESSINGS) ×3 IMPLANT
GLOVE BIO SURGEON STRL SZ7.5 (GLOVE) ×3 IMPLANT
GLOVE BIOGEL PI IND STRL 7.0 (GLOVE) ×2 IMPLANT
GLOVE BIOGEL PI IND STRL 7.5 (GLOVE) ×1 IMPLANT
GLOVE BIOGEL PI INDICATOR 7.0 (GLOVE) ×4
GLOVE BIOGEL PI INDICATOR 7.5 (GLOVE) ×2
GLOVE SURG ORTHO 8.0 STRL STRW (GLOVE) ×6 IMPLANT
GLOVE SURG SS PI 7.0 STRL IVOR (GLOVE) ×3 IMPLANT
GOWN STRL REUS W/ TWL LRG LVL3 (GOWN DISPOSABLE) ×1 IMPLANT
GOWN STRL REUS W/ TWL XL LVL3 (GOWN DISPOSABLE) ×1 IMPLANT
GOWN STRL REUS W/TWL LRG LVL3 (GOWN DISPOSABLE) ×2
GOWN STRL REUS W/TWL XL LVL3 (GOWN DISPOSABLE) ×2
HEMOSTAT SURGICEL 2X4 FIBR (HEMOSTASIS) ×3 IMPLANT
KIT BASIN OR (CUSTOM PROCEDURE TRAY) ×3 IMPLANT
KIT ROOM TURNOVER OR (KITS) ×3 IMPLANT
NS IRRIG 1000ML POUR BTL (IV SOLUTION) ×3 IMPLANT
PACK SURGICAL SETUP 50X90 (CUSTOM PROCEDURE TRAY) ×3 IMPLANT
PAD ARMBOARD 7.5X6 YLW CONV (MISCELLANEOUS) ×3 IMPLANT
PENCIL BUTTON HOLSTER BLD 10FT (ELECTRODE) ×3 IMPLANT
SHEARS HARMONIC 9CM CVD (BLADE) ×3 IMPLANT
SPECIMEN JAR MEDIUM (MISCELLANEOUS) IMPLANT
SPONGE INTESTINAL PEANUT (DISPOSABLE) ×3 IMPLANT
STRIP CLOSURE SKIN 1/2X4 (GAUZE/BANDAGES/DRESSINGS) ×2 IMPLANT
SUT MNCRL AB 4-0 PS2 18 (SUTURE) ×3 IMPLANT
SUT SILK 2 0 (SUTURE) ×2
SUT SILK 2-0 18XBRD TIE 12 (SUTURE) ×1 IMPLANT
SUT VIC AB 3-0 SH 18 (SUTURE) ×6 IMPLANT
SYR BULB 3OZ (MISCELLANEOUS) ×3 IMPLANT
TOWEL OR 17X24 6PK STRL BLUE (TOWEL DISPOSABLE) IMPLANT
TOWEL OR 17X26 10 PK STRL BLUE (TOWEL DISPOSABLE) ×3 IMPLANT
TUBE CONNECTING 12'X1/4 (SUCTIONS) ×1
TUBE CONNECTING 12X1/4 (SUCTIONS) ×2 IMPLANT

## 2015-04-25 NOTE — Interval H&P Note (Signed)
History and Physical Interval Note:  04/25/2015 11:31 AM  Holly Nunez  has presented today for surgery, with the diagnosis of LEFT THYROID MASS    The various methods of treatment have been discussed with the patient and family. After consideration of risks, benefits and other options for treatment, the patient has consented to    Procedure(s): LEFT THYROID LOBECTOMY (Left) as a surgical intervention .    The patient's history has been reviewed, patient examined, no change in status, stable for surgery.  I have reviewed the patient's chart and labs.  Questions were answered to the patient's satisfaction.    Earnstine Regal, MD, [facility] Office: [place]

## 2015-04-25 NOTE — Op Note (Signed)
Procedure Note  Pre-operative Diagnosis:  Multinodular thyroid goiter with compressive symptoms  Post-operative Diagnosis:  same  Surgeon:  Earnstine Regal, MD, FACS  Assistant:  Sharyn Dross, RNFA   Procedure:  Left thyroid lobectomy  Anesthesia:  General  Estimated Blood Loss:  minimal  Drains: none         Specimen: thyroid lobe to pathology  Indications:  The patient is a 81 year old female who presents with a complaint of Enlarged thyroid. Patient is referred by Dr. Willette Alma for evaluation of thyroid mass with neck discomfort. Patient has a long-standing enlarged thyroid which was diagnosed approximately one year ago in [place] when she presented with neck pain. Patient apparently underwent diagnostic studies followed by a percutaneous biopsy in [place] which was negative for malignancy by report. Patient reports that she has taken thyroid hormone in the past, presumably for suppression. She has not been on thyroid hormone in many years. Patient underwent a CT scan of the neck and chest to evaluate left-sided neck pain. Cervical spine CT scan on February 03, 2015 shows a large left thyroid mass measuring 6 x 5 x 4 cm containing coarse calcifications centrally. This extends into the anterior mediastinum inferiorly and displaces the trachea to the right. Nodules are also noted in the right thyroid lobe. Patient has significant degenerative and osteoarthritic changes in the cervical spine which may explain her pain.   Procedure Details: Procedure was done in OR #2 at the [facility].  The patient was brought to the operating room and placed in a supine position on the operating room table.  Following administration of general anesthesia, the patient was positioned and then prepped and draped in the usual aseptic fashion.  After ascertaining that an adequate level of anesthesia had been achieved, a Kocher incision was made with #15 blade.  Dissection was carried  through subcutaneous tissues and platysma. Hemostasis was achieved with the electrocautery.  Skin flaps were elevated cephalad and caudad from the thyroid notch to the sternal notch.  A self-retaining retractor was placed for exposure.  Strap muscles were incised in the midline and dissection was begun on the left side.  Strap muscles were reflected laterally.  The left thyroid lobe was markedly enlarged, firm, and multinodular.  The lobe was gently mobilized with blunt dissection.  Superior pole vessels were dissected out and divided individually between small and medium Ligaclips with the Harmonic scalpel.  The thyroid lobe was rolled anteriorly.  Branches of the inferior thyroid artery were divided between small Ligaclips with the Harmonic scalpel.  Inferior venous tributaries were divided between Ligaclips.  Both the superior and inferior parathyroid glands were identified and preserved on their vascular pedicles.  The recurrent laryngeal nerve was identified and preserved along its course.  The ligament of Gwenlyn Found was released with the electrocautery and the gland was mobilized onto the anterior trachea. Isthmus was mobilized across the midline.  There was a small pyramidal lobe present which was resected with the thyroid isthmus.  The thyroid parenchyma was transected at the junction of the isthmus and contralateral thyroid lobe with the Harmonic scalpel.  The thyroid lobe and isthmus were submitted to pathology for review.  The neck was irrigated with warm saline.  Fibular was placed throughout the operative field.  Strap muscles were reapproximated in the midline with interrupted 3-0 Vicryl sutures.  Platysma was closed with interrupted 3-0 Vicryl sutures.  Skin was closed with a running 4-0 Monocryl subcuticular suture.  Wound was  washed and dried and benzoin and steri-strips were applied.  Dry gauze dressing was placed.  The patient was awakened from anesthesia and brought to the recovery room.  The  patient tolerated the procedure well.   Earnstine Regal, MD, [facility] Office: [phone]

## 2015-04-25 NOTE — Anesthesia Procedure Notes (Signed)
Procedure Name: Intubation Date/Time: 04/25/2015 12:22 PM Performed by: Scheryl Darter Pre-anesthesia Checklist: Patient identified, Emergency Drugs available, Suction available, Patient being monitored and Timeout performed Patient Re-evaluated:Patient Re-evaluated prior to inductionOxygen Delivery Method: Circle system utilized Preoxygenation: Pre-oxygenation with 100% oxygen Intubation Type: IV induction Ventilation: Mask ventilation without difficulty Grade View: Grade I Tube type: Oral Tube size: 7.0 mm Number of attempts: 1 Airway Equipment and Method: Stylet Placement Confirmation: ETT inserted through vocal cords under direct vision,  positive ETCO2 and breath sounds checked- equal and bilateral Secured at: 22 cm Tube secured with: Tape Dental Injury: Teeth and Oropharynx as per pre-operative assessment

## 2015-04-25 NOTE — Anesthesia Postprocedure Evaluation (Signed)
Anesthesia Post Note  Patient: Holly Nunez  Procedure(s) Performed: Procedure(s) (LRB): LEFT THYROID LOBECTOMY (Left)  Patient location during evaluation: PACU Anesthesia Type: General Level of consciousness: awake and alert Pain management: pain level controlled Vital Signs Assessment: post-procedure vital signs reviewed and stable Respiratory status: spontaneous breathing, nonlabored ventilation, respiratory function stable and patient connected to nasal cannula oxygen Cardiovascular status: blood pressure returned to baseline and stable Postop Assessment: no signs of nausea or vomiting Anesthetic complications: no    Last Vitals:  Filed Vitals:   04/25/15 1500 04/25/15 1515  BP: 198/76   Pulse: 66 67  Temp: 36.7 C   Resp: 23 23    Last Pain: There were no vitals filed for this visit.               [place]

## 2015-04-25 NOTE — Anesthesia Preprocedure Evaluation (Signed)
Anesthesia Evaluation  Patient identified by MRN, date of birth, ID band Patient awake    Reviewed: Allergy & Precautions, NPO status , Patient's Chart, lab work & pertinent test results  Airway Mallampati: I  TM Distance: >3 FB Neck ROM: Full    Dental   Pulmonary    Pulmonary exam normal        Cardiovascular hypertension, Pt. on medications Normal cardiovascular exam     Neuro/Psych TIA   GI/Hepatic   Endo/Other    Renal/GU Renal InsufficiencyRenal disease     Musculoskeletal   Abdominal   Peds  Hematology   Anesthesia Other Findings   Reproductive/Obstetrics                             Anesthesia Physical Anesthesia Plan  ASA: III  Anesthesia Plan: General   Post-op Pain Management:    Induction: Intravenous  Airway Management Planned: Oral ETT  Additional Equipment:   Intra-op Plan:   Post-operative Plan: Extubation in OR  Informed Consent: I have reviewed the patients History and Physical, chart, labs and discussed the procedure including the risks, benefits and alternatives for the proposed anesthesia with the patient or authorized representative who has indicated his/her understanding and acceptance.     Plan Discussed with: CRNA and Surgeon  Anesthesia Plan Comments:         Anesthesia Quick Evaluation

## 2015-04-25 NOTE — Transfer of Care (Signed)
Immediate Anesthesia Transfer of Care Note  Patient: Holly Nunez  Procedure(s) Performed: Procedure(s): LEFT THYROID LOBECTOMY (Left)  Patient Location: PACU  Anesthesia Type:General  Level of Consciousness: awake, alert , oriented and sedated  Airway & Oxygen Therapy: Patient Spontanous Breathing and Patient connected to nasal cannula oxygen  Post-op Assessment: Report given to RN, Post -op Vital signs reviewed and stable and Patient moving all extremities  Post vital signs: Reviewed and stable  Last Vitals:  Filed Vitals:   04/25/15 0903 04/25/15 1402  BP: 146/59 172/75  Pulse: 56 65  Temp: 36.4 C 36.6 C  Resp: 20 18    Complications: No apparent anesthesia complications

## 2015-04-25 NOTE — Progress Notes (Signed)
NURSING PROGRESS NOTE  Mayce Dye [id number] Admission Data: 04/25/2015 3:33 PM Attending Provider: Armandina Gemma, MD [id number], Erline Levine, MD  Ahmiya Bauder is a 81 y.o. female patient admitted from PACU in stable condition. Ambulated to bathroom after arriving to room with 2 person assist, tolerated well.    Blood pressure 198/76, pulse 66, temperature 97.8 F (36.6 C), temperature source Oral, resp. rate 23, weight 66.679 kg (147 lb), SpO2 100 %.   IV Fluids:  IV in place, occlusive dsg intact without redness, IV cath wrist left, condition patent and no redness, fluids infusing.  Allergies:  Review of patient's allergies indicates no known allergies.  Past Medical History:   has a past medical history of Arthritis; Hypertension; Hyperlipidemia; Thyroid nodule; Urinary incontinence; Renal insufficiency; Patent foramen ovale; TIA (transient ischemic attack); History of chicken pox; H/O measles; and H/O mumps.  Past Surgical History:   has past surgical history that includes Joint replacement; Replacement total knee (Right, 2007); Trigger finger release (2007); Appendectomy; Abdominal hysterectomy; and Eye surgery (Bilateral).  Social History:   reports that she has never smoked. She has never used smokeless tobacco. She reports that she does not drink alcohol or use illicit drugs.    Patient orientated to room. Admission inpatient armband information verified with patient to include name and date of birth and placed on patient arm. Side rails up x 2, fall assessment and education completed with patient/family. Patient able to verbalize understanding of risk associated with falls and verbalized understanding to call for assistance before getting out of bed. Call light within reach. Patient/family able to voice and demonstrate understanding of unit orientation instructions.    Will continue to evaluate and treat per MD orders.     Doristine Devoid, RN

## 2015-04-25 NOTE — Discharge Instructions (Signed)
[facility] ° °THYROID & PARATHYROID SURGERY:  POST-OP INSTRUCTIONS ° °Always review your discharge instruction sheet from the facility where your surgery was performed. ° °A prescription for pain medication may be given to you upon discharge.  Take your pain medication as prescribed.  If narcotic pain medicine is not needed, then you may take acetaminophen (Tylenol) or ibuprofen (Advil) as needed. ° °Take your usually prescribed medications unless otherwise directed. ° °If you need a refill on your pain medication, please contact your pharmacy. They will contact our office to request authorization.  Prescriptions will not be processed by our office after 5 pm or on weekends. ° °Start with a light diet upon arrival home, such as soup and crackers or toast.  Be sure to drink plenty of fluids daily.  Resume your normal diet the day after surgery. ° °Most patients will experience some swelling and bruising on the chest and neck area.  Ice packs will help.  Swelling and bruising can take several days to resolve.  ° °It is common to experience some constipation after surgery.  Increasing fluid intake and taking a stool softener will usually help or prevent this problem.  A mild laxative (Milk of Magnesia or Miralax) should be taken according to package directions if there has been no bowel movement after 48 hours. ° °You have steri-strips and a gauze dressing over your incision.  You may remove the gauze bandage on the second day after surgery, and you may shower at that time.  Leave your steri-strips (small skin tapes) in place directly over the incision.  These strips should remain on the skin for 5-7 days and then be removed.  You may get them wet in the shower and pat them dry. ° °You may resume regular (light) daily activities beginning the next day - such as daily self-care, walking, climbing stairs - gradually increasing activities as tolerated.  You may have sexual intercourse when it is  comfortable.  Refrain from any heavy lifting or straining until approved by your doctor.  You may drive when you no longer are taking prescription pain medication, you can comfortably wear a seatbelt, and you can safely maneuver your car and apply brakes. ° °You should see your doctor in the office for a follow-up appointment approximately two to three weeks after your surgery.  Make sure that you call for this appointment within a day or two after you arrive home to insure a convenient appointment time. ° °WHEN TO CALL YOUR DOCTOR: °-- Fever greater than 101.5 °-- Inability to urinate °-- Nausea and/or vomiting - persistent °-- Extreme swelling or bruising °-- Continued bleeding from incision °-- Increased pain, redness, or drainage from the incision °-- Difficulty swallowing or breathing °-- Muscle cramping or spasms °-- Numbness or tingling in hands or around lips ° °The clinic staff is available to answer your questions during regular business hours.  Please don’t hesitate to call and ask to speak to one of the nurses if you have concerns. ° °Dayne Dekay M. Donnalyn Juran, MD, FACS °General & Endocrine Surgery °[facility] °Office: [phone] ° °Website: [email or web address] ° ° °

## 2015-04-26 DIAGNOSIS — Z7901 Long term (current) use of anticoagulants: Secondary | ICD-10-CM | POA: Diagnosis not present

## 2015-04-26 DIAGNOSIS — D34 Benign neoplasm of thyroid gland: Secondary | ICD-10-CM | POA: Diagnosis not present

## 2015-04-26 DIAGNOSIS — Z79899 Other long term (current) drug therapy: Secondary | ICD-10-CM | POA: Diagnosis not present

## 2015-04-26 DIAGNOSIS — Z90710 Acquired absence of both cervix and uterus: Secondary | ICD-10-CM | POA: Diagnosis not present

## 2015-04-26 DIAGNOSIS — I10 Essential (primary) hypertension: Secondary | ICD-10-CM | POA: Diagnosis not present

## 2015-04-26 MED ORDER — HYDROCODONE-ACETAMINOPHEN 5-325 MG PO TABS: 1.0000 | ORAL_TABLET | ORAL | Status: DC | PRN

## 2015-04-26 NOTE — Progress Notes (Signed)
Pt scheduled for discharge home this pm. Condition stable

## 2015-04-26 NOTE — Progress Notes (Signed)
Patient ID: Holly Nunez, female   DOB: [birth year], 81 y.o.   MRN: [id number]  LOS: 2 days  Subjective: Doing well. A little hoarse but swallowing well. Pain controlled.   Objective: Vital signs in last 24 hours: Temp:  [97.8 F (36.6 C)-98.8 F (37.1 C)] 98.6 F (37 C) (12/10 0513) Pulse Rate:  [62-94] 68 (12/10 0513) Resp:  [18-29] 18 (12/10 0513) BP: (127-198)/(57-81) 127/57 mmHg (12/10 0513) SpO2:  [94 %-100 %] 94 % (12/10 0513) Last BM Date: 04/24/15   Physical Exam General appearance: alert and no distress Resp: clear to auscultation bilaterally Cardio: regular rate and rhythm Incision/Wound:C/D/I   Assessment/Plan: S/p left thyroid lobectomy -- D/C home    Lisette Abu, PA-C Pager: [phone] 04/26/2015

## 2015-04-26 NOTE — Progress Notes (Signed)
Pt discharged home with daughter. Discharge education provided with no concerns voiced

## 2015-04-28 ENCOUNTER — Encounter (HOSPITAL_COMMUNITY): Payer: Self-pay | Admitting: Surgery

## 2015-04-28 NOTE — Progress Notes (Signed)
Quick Note:  Please contact patient and notify of benign pathology results.  Holly Nunez M. Yania Bogie, MD, FACS [facility] Office: [phone]   ______ 

## 2015-04-30 NOTE — Discharge Summary (Signed)
  Physician Discharge Summary [facility]  Patient ID: Holly Nunez MRN: [id number] DOB/AGE: 10-20-1924 80 y.o.  Admit date: 04/25/2015 Discharge date: 04/26/2015  Admission Diagnoses:  Multinodular goiter with compressive symptoms  Discharge Diagnoses:  Principal Problem:   Multinodular goiter Active Problems:   Multinodular goiter (nontoxic)   Discharged Condition: good  Hospital Course: Patient was admitted for observation following thyroid surgery.  Post op course was uncomplicated.  Pain was well controlled.  Tolerated diet.  Patient was prepared for discharge home on POD#1.  Consults: None  Treatments: surgery: left thyroid lobectomy  Discharge Exam: Blood pressure 136/48, pulse 64, temperature 98.7 F (37.1 C), temperature source Oral, resp. rate 18, weight 66.679 kg (147 lb), SpO2 99 %.  See exam in progress note day of discharge.   Disposition: Home     Medication List    TAKE these medications        acetaminophen 650 MG CR tablet  Commonly known as:  TYLENOL  Take 1,300 mg by mouth every 8 (eight) hours as needed for pain.     atorvastatin 40 MG tablet  Commonly known as:  LIPITOR  Take 1 tablet (40 mg total) by mouth daily.     brimonidine 0.15 % ophthalmic solution  Commonly known as:  ALPHAGAN  Place 1 drop into both eyes 3 (three) times daily.     carvedilol 3.125 MG tablet  Commonly known as:  COREG  Take 2 tablets bid     HYDROcodone-acetaminophen 5-325 MG tablet  Commonly known as:  NORCO/VICODIN  Take 1-2 tablets by mouth every 4 (four) hours as needed for moderate pain.     nitroGLYCERIN 0.4 MG/SPRAY spray  Commonly known as:  NITROLINGUAL  Place 1 spray under the tongue every 5 (five) minutes x 3 doses as needed for chest pain.     oxybutynin 5 MG 24 hr tablet  Commonly known as:  DITROPAN XL  Take one daily     Rivaroxaban 15 MG Tabs tablet  Commonly known as:  XARELTO  Take 1 tablet (15 mg total) by  mouth daily with supper.     triamterene-hydrochlorothiazide 37.5-25 MG tablet  Commonly known as:  MAXZIDE-25  Take one every am           Follow-up Information    Follow up with Earnstine Regal, MD. Schedule an appointment as soon as possible for a visit in 3 weeks.   Specialty:  General Surgery   Why:  For wound re-check   Contact information:   [place] [address] [phone]       Earnstine Regal, MD, [facility] Office: [phone]   Signed: Earnstine Regal 04/30/2015, 7:40 AM

## 2015-05-27 ENCOUNTER — Encounter: Payer: Medicare Other | Admitting: Family Medicine

## 2015-05-30 DIAGNOSIS — E079 Disorder of thyroid, unspecified: Secondary | ICD-10-CM | POA: Diagnosis not present

## 2015-07-25 ENCOUNTER — Encounter: Payer: Self-pay | Admitting: Family Medicine

## 2015-07-25 ENCOUNTER — Ambulatory Visit (INDEPENDENT_AMBULATORY_CARE_PROVIDER_SITE_OTHER): Payer: Medicare Other | Admitting: Family Medicine

## 2015-07-25 VITALS — BP 129/68 | HR 76 | Temp 98.1°F | Ht 60.0 in | Wt 144.0 lb

## 2015-07-25 DIAGNOSIS — N289 Disorder of kidney and ureter, unspecified: Secondary | ICD-10-CM | POA: Diagnosis not present

## 2015-07-25 DIAGNOSIS — I10 Essential (primary) hypertension: Secondary | ICD-10-CM

## 2015-07-25 DIAGNOSIS — E785 Hyperlipidemia, unspecified: Secondary | ICD-10-CM

## 2015-07-25 MED ORDER — CARVEDILOL 3.125 MG PO TABS: ORAL_TABLET | ORAL | Status: DC

## 2015-07-25 MED ORDER — OXYBUTYNIN CHLORIDE ER 5 MG PO TB24: 5.0000 mg | ORAL_TABLET | Freq: Every day | ORAL | Status: DC

## 2015-07-25 MED ORDER — ATORVASTATIN CALCIUM 40 MG PO TABS: 40.0000 mg | ORAL_TABLET | Freq: Every day | ORAL | Status: DC

## 2015-07-25 MED ORDER — TRIAMTERENE-HCTZ 37.5-25 MG PO TABS: 1.0000 | ORAL_TABLET | Freq: Every day | ORAL | Status: DC

## 2015-07-25 NOTE — Progress Notes (Signed)
Pre visit review using our clinic review tool, if applicable. No additional management support is needed unless otherwise documented below in the visit note. 

## 2015-07-25 NOTE — Patient Instructions (Signed)
Hypertension Hypertension, commonly called high blood pressure, is when the force of blood pumping through your arteries is too strong. Your arteries are the blood vessels that carry blood from your heart throughout your body. A blood pressure reading consists of a higher number over a lower number, such as 110/72. The higher number (systolic) is the pressure inside your arteries when your heart pumps. The lower number (diastolic) is the pressure inside your arteries when your heart relaxes. Ideally you want your blood pressure below 120/80. Hypertension forces your heart to work harder to pump blood. Your arteries may become narrow or stiff. Having untreated or uncontrolled hypertension can cause heart attack, stroke, kidney disease, and other problems. RISK FACTORS Some risk factors for high blood pressure are controllable. Others are not.  Risk factors you cannot control include:   Race. You may be at higher risk if you are African American.  Age. Risk increases with age.  Gender. Men are at higher risk than women before age 45 years. After age 65, women are at higher risk than men. Risk factors you can control include:  Not getting enough exercise or physical activity.  Being overweight.  Getting too much fat, sugar, calories, or salt in your diet.  Drinking too much alcohol. SIGNS AND SYMPTOMS Hypertension does not usually cause signs or symptoms. Extremely high blood pressure (hypertensive crisis) may cause headache, anxiety, shortness of breath, and nosebleed. DIAGNOSIS To check if you have hypertension, your health care provider will measure your blood pressure while you are seated, with your arm held at the level of your heart. It should be measured at least twice using the same arm. Certain conditions can cause a difference in blood pressure between your right and left arms. A blood pressure reading that is higher than normal on one occasion does not mean that you need treatment. If  it is not clear whether you have high blood pressure, you may be asked to return on a different day to have your blood pressure checked again. Or, you may be asked to monitor your blood pressure at home for 1 or more weeks. TREATMENT Treating high blood pressure includes making lifestyle changes and possibly taking medicine. Living a healthy lifestyle can help lower high blood pressure. You may need to change some of your habits. Lifestyle changes may include:  Following the DASH diet. This diet is high in fruits, vegetables, and whole grains. It is low in salt, red meat, and added sugars.  Keep your sodium intake below 2,300 mg per day.  Getting at least 30-45 minutes of aerobic exercise at least 4 times per week.  Losing weight if necessary.  Not smoking.  Limiting alcoholic beverages.  Learning ways to reduce stress. Your health care provider may prescribe medicine if lifestyle changes are not enough to get your blood pressure under control, and if one of the following is true:  You are 18-59 years of age and your systolic blood pressure is above 140.  You are 60 years of age or older, and your systolic blood pressure is above 150.  Your diastolic blood pressure is above 90.  You have diabetes, and your systolic blood pressure is over 140 or your diastolic blood pressure is over 90.  You have kidney disease and your blood pressure is above 140/90.  You have heart disease and your blood pressure is above 140/90. Your personal target blood pressure may vary depending on your medical conditions, your age, and other factors. HOME CARE INSTRUCTIONS    Have your blood pressure rechecked as directed by your health care provider.   Take medicines only as directed by your health care provider. Follow the directions carefully. Blood pressure medicines must be taken as prescribed. The medicine does not work as well when you skip doses. Skipping doses also puts you at risk for  problems.  Do not smoke.   Monitor your blood pressure at home as directed by your health care provider. SEEK MEDICAL CARE IF:   You think you are having a reaction to medicines taken.  You have recurrent headaches or feel dizzy.  You have swelling in your ankles.  You have trouble with your vision. SEEK IMMEDIATE MEDICAL CARE IF:  You develop a severe headache or confusion.  You have unusual weakness, numbness, or feel faint.  You have severe chest or abdominal pain.  You vomit repeatedly.  You have trouble breathing. MAKE SURE YOU:   Understand these instructions.  Will watch your condition.  Will get help right away if you are not doing well or get worse.   This information is not intended to replace advice given to you by your health care provider. Make sure you discuss any questions you have with your health care provider.   Document Released: 05/03/2005 Document Revised: 09/17/2014 Document Reviewed: 02/23/2013 Elsevier Interactive Patient Education 2016 Elsevier Inc.  

## 2015-08-03 NOTE — Assessment & Plan Note (Signed)
Well controlled, no changes to meds. Encouraged heart healthy diet such as the DASH diet and exercise as tolerated.  °

## 2015-08-03 NOTE — Assessment & Plan Note (Signed)
Mild, encouraged to maintain adequate hydration

## 2015-08-03 NOTE — Progress Notes (Signed)
Patient ID: Holly Nunez, female   DOB: [birth year], 82 y.o.   MRN: [id number]   Subjective:    Patient ID: Holly Nunez, female    DOB: Sep 22, 1924, 82 y.o.   MRN: [id number]  Chief Complaint  Patient presents with  . Medication Refill    HPI Patient is in today for follow up. She feels well today, she underwent a thyroidectomy back in December and healed well. No recent illness, no other acute concerns. Denies CP/palp/SOB/HA/congestion/fevers/GI or GU c/o. Taking meds as prescribed  Past Medical History  Diagnosis Date  . Arthritis   . Hypertension   . Hyperlipidemia   . Thyroid nodule     Biopsy-negative  . Urinary incontinence   . Renal insufficiency   . Patent foramen ovale   . TIA (transient ischemic attack)   . History of chicken pox   . H/O measles   . H/O mumps     Past Surgical History  Procedure Laterality Date  . Joint replacement    . Replacement total knee Right 2007  . Trigger finger release  2007  . Appendectomy    . Abdominal hysterectomy      fibroid  . Eye surgery Bilateral     CATARACT REMOVAL  . Thyroid lobectomy Left 04/25/2015  . Thyroid lobectomy Left 04/25/2015    Procedure: LEFT THYROID LOBECTOMY;  Surgeon: Armandina Gemma, MD;  Location: [facility] OR;  Service: General;  Laterality: Left;    Family History  Problem Relation Age of Onset  . Heart disease Mother   . Hypertension Mother   . Kidney disease Sister   . Mental illness Sister     suicide, depresion  . Cancer Brother     prostate  . Gout Brother   . Hypertension Son   . Cancer Maternal Grandmother   . Cancer Sister   . Bone cancer Sister   . Cancer Sister   . Cancer Sister     kidney  . Cancer Daughter   . Pancreatic cancer Daughter   . Thyroid disease Daughter   . Thyroid disease Daughter     thyroid nodules  . Hypertension Daughter   . Irritable bowel syndrome Daughter   . Cancer Daughter     thyroid    Social History   Social History  . Marital Status: Widowed   Spouse Name: N/A  . Number of Children: 4  . Years of Education: N/A   Occupational History  . Not on file.   Social History Main Topics  . Smoking status: Never Smoker   . Smokeless tobacco: Never Used  . Alcohol Use: No  . Drug Use: No  . Sexual Activity:    Partners: Male     Comment: no dietary restrictions, lives with 2 daughters   Other Topics Concern  . Not on file   Social History Narrative    Outpatient Prescriptions Prior to Visit  Medication Sig Dispense Refill  . acetaminophen (TYLENOL) 650 MG CR tablet Take 1,300 mg by mouth every 8 (eight) hours as needed for pain.    . brimonidine (ALPHAGAN) 0.15 % ophthalmic solution Place 1 drop into both eyes 3 (three) times daily.    Marland Kitchen HYDROcodone-acetaminophen (NORCO/VICODIN) 5-325 MG tablet Take 1-2 tablets by mouth every 4 (four) hours as needed for moderate pain. 30 tablet 0  . nitroGLYCERIN (NITROLINGUAL) 0.4 MG/SPRAY spray Place 1 spray under the tongue every 5 (five) minutes x 3 doses as needed for chest pain.    . Rivaroxaban (  XARELTO) 15 MG TABS tablet Take 1 tablet (15 mg total) by mouth daily with supper. 30 tablet 11  . atorvastatin (LIPITOR) 40 MG tablet Take 1 tablet (40 mg total) by mouth daily. 30 tablet 6  . carvedilol (COREG) 3.125 MG tablet Take 2 tablets bid (Patient taking differently: Take 6.25 mg by mouth 2 (two) times daily with a meal. Take 2 tablets bid) 120 tablet 5  . oxybutynin (DITROPAN XL) 5 MG 24 hr tablet Take one daily (Patient taking differently: Take 5 mg by mouth daily. Take one daily) 90 tablet 1  . triamterene-hydrochlorothiazide (MAXZIDE-25) 37.5-25 MG per tablet Take one every am (Patient taking differently: Take 1 tablet by mouth daily. Take one every am) 30 tablet 5   No facility-administered medications prior to visit.    No Known Allergies  Review of Systems  Constitutional: Negative for fever and malaise/fatigue.  HENT: Negative for congestion.   Eyes: Negative for blurred  vision.  Respiratory: Negative for shortness of breath.   Cardiovascular: Negative for chest pain, palpitations and leg swelling.  Gastrointestinal: Negative for nausea, abdominal pain and blood in stool.  Genitourinary: Negative for dysuria and frequency.  Musculoskeletal: Negative for falls.  Skin: Negative for rash.  Neurological: Negative for dizziness, loss of consciousness and headaches.  Endo/Heme/Allergies: Negative for environmental allergies.  Psychiatric/Behavioral: Negative for depression. The patient is not nervous/anxious.        Objective:    Physical Exam  Constitutional: She is oriented to person, place, and time. She appears well-developed and well-nourished. No distress.  HENT:  Head: Normocephalic and atraumatic.  Nose: Nose normal.  Eyes: Right eye exhibits no discharge. Left eye exhibits no discharge.  Neck: Normal range of motion. Neck supple.  Cardiovascular: Normal rate and regular rhythm.   No murmur heard. Pulmonary/Chest: Effort normal and breath sounds normal.  Abdominal: Soft. Bowel sounds are normal. There is no tenderness.  Musculoskeletal: She exhibits no edema.  Neurological: She is alert and oriented to person, place, and time.  Skin: Skin is warm and dry.  Psychiatric: She has a normal mood and affect.  Nursing note and vitals reviewed.   BP 129/68 mmHg  Pulse 76  Temp(Src) 98.1 F (36.7 C) (Oral)  Ht 5' (1.524 m)  Wt 144 lb (65.318 kg)  BMI 28.12 kg/m2  SpO2 99% Wt Readings from Last 3 Encounters:  07/25/15 144 lb (65.318 kg)  04/25/15 147 lb (66.679 kg)  04/18/15 147 lb 6.4 oz (66.86 kg)     Lab Results  Component Value Date   WBC 6.6 04/18/2015   HGB 12.2 04/18/2015   HCT 36.0 04/18/2015   PLT 182 04/18/2015   GLUCOSE 101* 04/18/2015   CHOL 129 02/04/2015   TRIG 101.0 02/04/2015   HDL 66.30 02/04/2015   LDLCALC 42 02/04/2015   ALT 15 02/04/2015   AST 18 02/04/2015   NA 137 04/18/2015   K 4.1 04/18/2015   CL 103  04/18/2015   CREATININE 1.28* 04/18/2015   BUN 25* 04/18/2015   CO2 25 04/18/2015   TSH 0.92 02/04/2015   INR 1.13 04/25/2015    Lab Results  Component Value Date   TSH 0.92 02/04/2015   Lab Results  Component Value Date   WBC 6.6 04/18/2015   HGB 12.2 04/18/2015   HCT 36.0 04/18/2015   MCV 95.5 04/18/2015   PLT 182 04/18/2015   Lab Results  Component Value Date   NA 137 04/18/2015   K 4.1 04/18/2015  CO2 25 04/18/2015   GLUCOSE 101* 04/18/2015   BUN 25* 04/18/2015   CREATININE 1.28* 04/18/2015   BILITOT 0.6 02/04/2015   ALKPHOS 49 02/04/2015   AST 18 02/04/2015   ALT 15 02/04/2015   PROT 7.3 02/04/2015   ALBUMIN 3.9 02/04/2015   CALCIUM 9.9 04/18/2015   ANIONGAP 9 04/18/2015   GFR 52.02* 02/04/2015   Lab Results  Component Value Date   CHOL 129 02/04/2015   Lab Results  Component Value Date   HDL 66.30 02/04/2015   Lab Results  Component Value Date   LDLCALC 42 02/04/2015   Lab Results  Component Value Date   TRIG 101.0 02/04/2015   Lab Results  Component Value Date   CHOLHDL 2 02/04/2015   No results found for: HGBA1C     Assessment & Plan:   Problem List Items Addressed This Visit    HTN (hypertension) - Primary    Well controlled, no changes to meds. Encouraged heart healthy diet such as the DASH diet and exercise as tolerated.       Relevant Medications   triamterene-hydrochlorothiazide (MAXZIDE-25) 37.5-25 MG tablet   carvedilol (COREG) 3.125 MG tablet   atorvastatin (LIPITOR) 40 MG tablet   Hyperlipidemia    Encouraged heart healthy diet, increase exercise, avoid trans fats, consider a krill oil cap daily      Relevant Medications   triamterene-hydrochlorothiazide (MAXZIDE-25) 37.5-25 MG tablet   carvedilol (COREG) 3.125 MG tablet   atorvastatin (LIPITOR) 40 MG tablet   Renal insufficiency    Mild, encouraged to maintain adequate hydration         I have changed Ms. Habibi's oxybutynin and triamterene-hydrochlorothiazide.  I am also having her maintain her nitroGLYCERIN, brimonidine, Rivaroxaban, acetaminophen, HYDROcodone-acetaminophen, carvedilol, and atorvastatin.  Meds ordered this encounter  Medications  . oxybutynin (DITROPAN XL) 5 MG 24 hr tablet    Sig: Take 1 tablet (5 mg total) by mouth daily. Take one daily    Dispense:  30 tablet    Refill:  6  . triamterene-hydrochlorothiazide (MAXZIDE-25) 37.5-25 MG tablet    Sig: Take 1 tablet by mouth daily.    Dispense:  30 tablet    Refill:  6  . carvedilol (COREG) 3.125 MG tablet    Sig: Take 2 tablets bid    Dispense:  120 tablet    Refill:  6  . atorvastatin (LIPITOR) 40 MG tablet    Sig: Take 1 tablet (40 mg total) by mouth daily.    Dispense:  30 tablet    Refill:  6     Penni Homans, MD

## 2015-08-03 NOTE — Assessment & Plan Note (Signed)
Encouraged heart healthy diet, increase exercise, avoid trans fats, consider a krill oil cap daily 

## 2015-08-05 ENCOUNTER — Telehealth: Payer: Self-pay | Admitting: *Deleted

## 2015-08-05 NOTE — Telephone Encounter (Signed)
Received fax request for PA on Oxybutynin; tried and failed with three different Rx Insurance forms; called pharmacy and they do not have any further information [alternative medications]

## 2015-08-26 DIAGNOSIS — H401132 Primary open-angle glaucoma, bilateral, moderate stage: Secondary | ICD-10-CM | POA: Diagnosis not present

## 2015-09-03 ENCOUNTER — Ambulatory Visit: Payer: Medicare Other | Admitting: Cardiology

## 2015-09-09 NOTE — Progress Notes (Signed)
HPI: FU hypertension, patent foramen ovale and prior TIA. Patient previously lived in [place]. Echocardiogram in November 2014 showed normal LV function, mild to moderate left ventricular hypertrophy, grade 2 diastolic dysfunction, aneurysmal atrial septum with PFO. There was moderate atherosclerosis in the aortic arch. She has a history of TIAs; Initially treated with Plavix and aspirin but recurrence and now on xarelto. CT 9/16 showed thyromegaly and coronary calcification. Nuclear study 10/16 showed EF 75 with normal perfusion. Since last seen, the patient denies any dyspnea on exertion, orthopnea, PND, pedal edema, palpitations, syncope or chest pain.   Current Outpatient Prescriptions  Medication Sig Dispense Refill  . acetaminophen (TYLENOL) 650 MG CR tablet Take 1,300 mg by mouth every 8 (eight) hours as needed for pain.    Marland Kitchen atorvastatin (LIPITOR) 40 MG tablet Take 1 tablet (40 mg total) by mouth daily. 30 tablet 6  . brimonidine (ALPHAGAN) 0.15 % ophthalmic solution Place 1 drop into both eyes 3 (three) times daily.    . carvedilol (COREG) 3.125 MG tablet Take 2 tablets bid 120 tablet 6  . HYDROcodone-acetaminophen (NORCO/VICODIN) 5-325 MG tablet Take 1-2 tablets by mouth every 4 (four) hours as needed for moderate pain. 30 tablet 0  . nitroGLYCERIN (NITROLINGUAL) 0.4 MG/SPRAY spray Place 1 spray under the tongue every 5 (five) minutes x 3 doses as needed for chest pain.    Marland Kitchen oxybutynin (DITROPAN XL) 5 MG 24 hr tablet Take 1 tablet (5 mg total) by mouth daily. Take one daily 30 tablet 6  . Rivaroxaban (XARELTO) 15 MG TABS tablet Take 1 tablet (15 mg total) by mouth daily with supper. 30 tablet 11  . triamterene-hydrochlorothiazide (MAXZIDE-25) 37.5-25 MG tablet Take 1 tablet by mouth daily. 30 tablet 6   No current facility-administered medications for this visit.     Past Medical History  Diagnosis Date  . Arthritis   . Hypertension   . Hyperlipidemia   . Thyroid nodule      Biopsy-negative  . Urinary incontinence   . Renal insufficiency   . Patent foramen ovale   . TIA (transient ischemic attack)   . History of chicken pox   . H/O measles   . H/O mumps     Past Surgical History  Procedure Laterality Date  . Joint replacement    . Replacement total knee Right 2007  . Trigger finger release  2007  . Appendectomy    . Abdominal hysterectomy      fibroid  . Eye surgery Bilateral     CATARACT REMOVAL  . Thyroid lobectomy Left 04/25/2015  . Thyroid lobectomy Left 04/25/2015    Procedure: LEFT THYROID LOBECTOMY;  Surgeon: Armandina Gemma, MD;  Location: [place];  Service: General;  Laterality: Left;    Social History   Social History  . Marital Status: Widowed    Spouse Name: N/A  . Number of Children: 4  . Years of Education: N/A   Occupational History  . Not on file.   Social History Main Topics  . Smoking status: Never Smoker   . Smokeless tobacco: Never Used  . Alcohol Use: No  . Drug Use: No  . Sexual Activity:    Partners: Male     Comment: no dietary restrictions, lives with 2 daughters   Other Topics Concern  . Not on file   Social History Narrative    Family History  Problem Relation Age of Onset  . Heart disease Mother   . Hypertension Mother   .  Kidney disease Sister   . Mental illness Sister     suicide, depresion  . Cancer Brother     prostate  . Gout Brother   . Hypertension Son   . Cancer Maternal Grandmother   . Cancer Sister   . Bone cancer Sister   . Cancer Sister   . Cancer Sister     kidney  . Cancer Daughter   . Pancreatic cancer Daughter   . Thyroid disease Daughter   . Thyroid disease Daughter     thyroid nodules  . Hypertension Daughter   . Irritable bowel syndrome Daughter   . Cancer Daughter     thyroid    ROS: no fevers or chills, productive cough, hemoptysis, dysphasia, odynophagia, melena, hematochezia, dysuria, hematuria, rash, seizure activity, orthopnea, PND, pedal edema, claudication.  Remaining systems are negative.  Physical Exam: Well-developed well-nourished in no acute distress.  Skin is warm and dry.  HEENT is normal.  Neck is supple.  Chest is clear to auscultation with normal expansion.  Cardiovascular exam is regular rate and rhythm. [id number] systolic murmur left sternal border. Abdominal exam nontender or distended. No masses palpated. Extremities show no edema. neuro grossly intact  ECG Sinus rhythm at a rate of 63. No ST changes.

## 2015-09-10 ENCOUNTER — Encounter: Payer: Self-pay | Admitting: Cardiology

## 2015-09-10 ENCOUNTER — Ambulatory Visit (INDEPENDENT_AMBULATORY_CARE_PROVIDER_SITE_OTHER): Payer: Medicare Other | Admitting: Cardiology

## 2015-09-10 VITALS — BP 119/71 | HR 68 | Ht 60.0 in | Wt 141.1 lb

## 2015-09-10 DIAGNOSIS — E785 Hyperlipidemia, unspecified: Secondary | ICD-10-CM | POA: Diagnosis not present

## 2015-09-10 DIAGNOSIS — I10 Essential (primary) hypertension: Secondary | ICD-10-CM | POA: Diagnosis not present

## 2015-09-10 MED ORDER — RIVAROXABAN 15 MG PO TABS: 15.0000 mg | ORAL_TABLET | Freq: Every day | ORAL | Status: DC

## 2015-09-10 NOTE — Assessment & Plan Note (Signed)
Blood pressure controlled. Continue present medications. 

## 2015-09-10 NOTE — Patient Instructions (Signed)
Medication Instructions:   NO CHANGE  Labwork:  Your physician recommends that you return for lab work [id number] CONVENIENT   Follow-Up:  Your physician wants you to follow-up in: [place] will receive a reminder letter in the mail two months in advance. If you don't receive a letter, please call our office to schedule the follow-up appointment.

## 2015-09-10 NOTE — Assessment & Plan Note (Signed)
In reviewing her outside records previously the patient has had TIAs in the setting of patent foramen ovale and atrial septal aneurysm. She had a repeat TIA despite aspirin and Plavix. She was therefore anticoagulated. Continue xarelto. Check hemoglobin and renal function.

## 2015-09-10 NOTE — Assessment & Plan Note (Signed)
Continue statin. 

## 2015-09-15 DIAGNOSIS — I10 Essential (primary) hypertension: Secondary | ICD-10-CM | POA: Diagnosis not present

## 2015-09-15 LAB — CBC
HCT: 37.8 % (ref 35.0–45.0)
Hemoglobin: 12.5 g/dL (ref 11.7–15.5)
MCH: 31.4 pg (ref 27.0–33.0)
MCHC: 33.1 g/dL (ref 32.0–36.0)
MCV: 95 fL (ref 80.0–100.0)
MPV: 9.4 fL (ref 7.5–12.5)
PLATELETS: 192 10*3/uL (ref 140–400)
RBC: 3.98 MIL/uL (ref 3.80–5.10)
RDW: 13.2 % (ref 11.0–15.0)
WBC: 6.1 10*3/uL (ref 3.8–10.8)

## 2015-09-16 LAB — BASIC METABOLIC PANEL
BUN: 23 mg/dL (ref 7–25)
CHLORIDE: 98 mmol/L (ref 98–110)
CO2: 27 mmol/L (ref 20–31)
Calcium: 9.8 mg/dL (ref 8.6–10.4)
Creat: 1.19 mg/dL — ABNORMAL HIGH (ref 0.60–0.88)
Glucose, Bld: 104 mg/dL — ABNORMAL HIGH (ref 65–99)
POTASSIUM: 4 mmol/L (ref 3.5–5.3)
SODIUM: 137 mmol/L (ref 135–146)

## 2015-09-17 DIAGNOSIS — H401132 Primary open-angle glaucoma, bilateral, moderate stage: Secondary | ICD-10-CM | POA: Diagnosis not present

## 2015-11-25 ENCOUNTER — Ambulatory Visit (INDEPENDENT_AMBULATORY_CARE_PROVIDER_SITE_OTHER): Payer: Medicare Other | Admitting: Family Medicine

## 2015-11-25 ENCOUNTER — Encounter: Payer: Self-pay | Admitting: Family Medicine

## 2015-11-25 VITALS — BP 108/72 | HR 64 | Temp 98.0°F | Ht 60.0 in | Wt 142.5 lb

## 2015-11-25 DIAGNOSIS — D538 Other specified nutritional anemias: Secondary | ICD-10-CM | POA: Diagnosis not present

## 2015-11-25 DIAGNOSIS — E785 Hyperlipidemia, unspecified: Secondary | ICD-10-CM

## 2015-11-25 DIAGNOSIS — N3281 Overactive bladder: Secondary | ICD-10-CM

## 2015-11-25 DIAGNOSIS — M159 Polyosteoarthritis, unspecified: Secondary | ICD-10-CM

## 2015-11-25 DIAGNOSIS — N289 Disorder of kidney and ureter, unspecified: Secondary | ICD-10-CM

## 2015-11-25 DIAGNOSIS — M150 Primary generalized (osteo)arthritis: Secondary | ICD-10-CM

## 2015-11-25 DIAGNOSIS — I10 Essential (primary) hypertension: Secondary | ICD-10-CM

## 2015-11-25 DIAGNOSIS — Z0000 Encounter for general adult medical examination without abnormal findings: Secondary | ICD-10-CM

## 2015-11-25 DIAGNOSIS — Z23 Encounter for immunization: Secondary | ICD-10-CM | POA: Diagnosis not present

## 2015-11-25 DIAGNOSIS — T7840XD Allergy, unspecified, subsequent encounter: Secondary | ICD-10-CM

## 2015-11-25 LAB — LIPID PANEL
CHOL/HDL RATIO: 2
Cholesterol: 150 mg/dL (ref 0–200)
HDL: 69.7 mg/dL (ref >39.00)
LDL Cholesterol: 57 mg/dL (ref 0–99)
NonHDL: 80.17
Triglycerides: 115 mg/dL (ref 0.0–149.0)
VLDL: 23 mg/dL (ref 0.0–40.0)

## 2015-11-25 LAB — COMPREHENSIVE METABOLIC PANEL
ALBUMIN: 4.3 g/dL (ref 3.5–5.2)
ALK PHOS: 57 U/L (ref 39–117)
ALT: 14 U/L (ref 0–35)
AST: 19 U/L (ref 0–37)
BILIRUBIN TOTAL: 0.5 mg/dL (ref 0.2–1.2)
BUN: 24 mg/dL — ABNORMAL HIGH (ref 6–23)
CALCIUM: 10.3 mg/dL (ref 8.4–10.5)
CO2: 28 mEq/L (ref 19–32)
Chloride: 102 mEq/L (ref 96–112)
Creatinine, Ser: 1.19 mg/dL (ref 0.40–1.20)
GFR: 54.96 mL/min — AB (ref >60.00)
Glucose, Bld: 121 mg/dL — ABNORMAL HIGH (ref 70–99)
Potassium: 4.2 mEq/L (ref 3.5–5.1)
Sodium: 137 mEq/L (ref 135–145)
TOTAL PROTEIN: 7.6 g/dL (ref 6.0–8.3)

## 2015-11-25 LAB — TSH: TSH: 1.67 u[IU]/mL (ref 0.35–4.50)

## 2015-11-25 MED ORDER — OXYBUTYNIN CHLORIDE ER 10 MG PO TB24: 10.0000 mg | ORAL_TABLET | Freq: Every day | ORAL | Status: DC

## 2015-11-25 MED ORDER — FLUTICASONE PROPIONATE 50 MCG/ACT NA SUSP: 2.0000 | Freq: Every day | NASAL | Status: DC

## 2015-11-25 NOTE — Progress Notes (Signed)
Pre visit review using our clinic review tool, if applicable. No additional management support is needed unless otherwise documented below in the visit note. 

## 2015-11-25 NOTE — Patient Instructions (Signed)
Preventive Care for Adults, Female A healthy lifestyle and preventive care can promote health and wellness. Preventive health guidelines for women include the following key practices.  A routine yearly physical is a good way to check with your health care provider about your health and preventive screening. It is a chance to share any concerns and updates on your health and to receive a thorough exam.  Visit your dentist for a routine exam and preventive care every 6 months. Brush your teeth twice a day and floss once a day. Good oral hygiene prevents tooth decay and gum disease.  The frequency of eye exams is based on your age, health, family medical history, use of contact lenses, and other factors. Follow your health care provider's recommendations for frequency of eye exams.  Eat a healthy diet. Foods like vegetables, fruits, whole grains, low-fat dairy products, and lean protein foods contain the nutrients you need without too many calories. Decrease your intake of foods high in solid fats, added sugars, and salt. Eat the right amount of calories for you.Get information about a proper diet from your health care provider, if necessary.  Regular physical exercise is one of the most important things you can do for your health. Most adults should get at least 150 minutes of moderate-intensity exercise (any activity that increases your heart rate and causes you to sweat) each week. In addition, most adults need muscle-strengthening exercises on 2 or more days a week.  Maintain a healthy weight. The body mass index (BMI) is a screening tool to identify possible weight problems. It provides an estimate of body fat based on height and weight. Your health care provider can find your BMI and can help you achieve or maintain a healthy weight.For adults 20 years and older:  A BMI below 18.5 is considered underweight.  A BMI of 18.5 to 24.9 is normal.  A BMI of 25 to 29.9 is considered overweight.  A  BMI of 30 and above is considered obese.  Maintain normal blood lipids and cholesterol levels by exercising and minimizing your intake of saturated fat. Eat a balanced diet with plenty of fruit and vegetables. Blood tests for lipids and cholesterol should begin at age 45 and be repeated every 5 years. If your lipid or cholesterol levels are high, you are over 50, or you are at high risk for heart disease, you may need your cholesterol levels checked more frequently.Ongoing high lipid and cholesterol levels should be treated with medicines if diet and exercise are not working.  If you smoke, find out from your health care provider how to quit. If you do not use tobacco, do not start.  Lung cancer screening is recommended for adults aged 45-80 years who are at high risk for developing lung cancer because of a history of smoking. A yearly low-dose CT scan of the lungs is recommended for people who have at least a 30-pack-year history of smoking and are a current smoker or have quit within the past 15 years. A pack year of smoking is smoking an average of 1 pack of cigarettes a day for 1 year (for example: 1 pack a day for 30 years or 2 packs a day for 15 years). Yearly screening should continue until the smoker has stopped smoking for at least 15 years. Yearly screening should be stopped for people who develop a health problem that would prevent them from having lung cancer treatment.  If you are pregnant, do not drink alcohol. If you are  breastfeeding, be very cautious about drinking alcohol. If you are not pregnant and choose to drink alcohol, do not have more than 1 drink per day. One drink is considered to be 12 ounces (355 mL) of beer, 5 ounces (148 mL) of wine, or 1.5 ounces (44 mL) of liquor.  Avoid use of street drugs. Do not share needles with anyone. Ask for help if you need support or instructions about stopping the use of drugs.  High blood pressure causes heart disease and increases the risk  of stroke. Your blood pressure should be checked at least every 1 to 2 years. Ongoing high blood pressure should be treated with medicines if weight loss and exercise do not work.  If you are 55-79 years old, ask your health care provider if you should take aspirin to prevent strokes.  Diabetes screening is done by taking a blood sample to check your blood glucose level after you have not eaten for a certain period of time (fasting). If you are not overweight and you do not have risk factors for diabetes, you should be screened once every 3 years starting at age 45. If you are overweight or obese and you are 40-70 years of age, you should be screened for diabetes every year as part of your cardiovascular risk assessment.  Breast cancer screening is essential preventive care for women. You should practice "breast self-awareness." This means understanding the normal appearance and feel of your breasts and may include breast self-examination. Any changes detected, no matter how small, should be reported to a health care provider. Women in their 20s and 30s should have a clinical breast exam (CBE) by a health care provider as part of a regular health exam every 1 to 3 years. After age 40, women should have a CBE every year. Starting at age 40, women should consider having a mammogram (breast X-ray test) every year. Women who have a family history of breast cancer should talk to their health care provider about genetic screening. Women at a high risk of breast cancer should talk to their health care providers about having an MRI and a mammogram every year.  Breast cancer gene (BRCA)-related cancer risk assessment is recommended for women who have family members with BRCA-related cancers. BRCA-related cancers include breast, ovarian, tubal, and peritoneal cancers. Having family members with these cancers may be associated with an increased risk for harmful changes (mutations) in the breast cancer genes BRCA1 and  BRCA2. Results of the assessment will determine the need for genetic counseling and BRCA1 and BRCA2 testing.  Your health care provider may recommend that you be screened regularly for cancer of the pelvic organs (ovaries, uterus, and vagina). This screening involves a pelvic examination, including checking for microscopic changes to the surface of your cervix (Pap test). You may be encouraged to have this screening done every 3 years, beginning at age 21.  For women ages 30-65, health care providers may recommend pelvic exams and Pap testing every 3 years, or they may recommend the Pap and pelvic exam, combined with testing for human papilloma virus (HPV), every 5 years. Some types of HPV increase your risk of cervical cancer. Testing for HPV may also be done on women of any age with unclear Pap test results.  Other health care providers may not recommend any screening for nonpregnant women who are considered low risk for pelvic cancer and who do not have symptoms. Ask your health care provider if a screening pelvic exam is right for   you.  If you have had past treatment for cervical cancer or a condition that could lead to cancer, you need Pap tests and screening for cancer for at least 20 years after your treatment. If Pap tests have been discontinued, your risk factors (such as having a new sexual partner) need to be reassessed to determine if screening should resume. Some women have medical problems that increase the chance of getting cervical cancer. In these cases, your health care provider may recommend more frequent screening and Pap tests.  Colorectal cancer can be detected and often prevented. Most routine colorectal cancer screening begins at the age of 50 years and continues through age 75 years. However, your health care provider may recommend screening at an earlier age if you have risk factors for colon cancer. On a yearly basis, your health care provider may provide home test kits to check  for hidden blood in the stool. Use of a small camera at the end of a tube, to directly examine the colon (sigmoidoscopy or colonoscopy), can detect the earliest forms of colorectal cancer. Talk to your health care provider about this at age 50, when routine screening begins. Direct exam of the colon should be repeated every 5-10 years through age 75 years, unless early forms of precancerous polyps or small growths are found.  People who are at an increased risk for hepatitis B should be screened for this virus. You are considered at high risk for hepatitis B if:  You were born in a country where hepatitis B occurs often. Talk with your health care provider about which countries are considered high risk.  Your parents were born in a high-risk country and you have not received a shot to protect against hepatitis B (hepatitis B vaccine).  You have HIV or AIDS.  You use needles to inject street drugs.  You live with, or have sex with, someone who has hepatitis B.  You get hemodialysis treatment.  You take certain medicines for conditions like cancer, organ transplantation, and autoimmune conditions.  Hepatitis C blood testing is recommended for all people born from 1945 through 1965 and any individual with known risks for hepatitis C.  Practice safe sex. Use condoms and avoid high-risk sexual practices to reduce the spread of sexually transmitted infections (STIs). STIs include gonorrhea, chlamydia, syphilis, trichomonas, herpes, HPV, and human immunodeficiency virus (HIV). Herpes, HIV, and HPV are viral illnesses that have no cure. They can result in disability, cancer, and death.  You should be screened for sexually transmitted illnesses (STIs) including gonorrhea and chlamydia if:  You are sexually active and are younger than 24 years.  You are older than 24 years and your health care provider tells you that you are at risk for this type of infection.  Your sexual activity has changed  since you were last screened and you are at an increased risk for chlamydia or gonorrhea. Ask your health care provider if you are at risk.  If you are at risk of being infected with HIV, it is recommended that you take a prescription medicine daily to prevent HIV infection. This is called preexposure prophylaxis (PrEP). You are considered at risk if:  You are sexually active and do not regularly use condoms or know the HIV status of your partner(s).  You take drugs by injection.  You are sexually active with a partner who has HIV.  Talk with your health care provider about whether you are at high risk of being infected with HIV. If   you choose to begin PrEP, you should first be tested for HIV. You should then be tested every 3 months for as long as you are taking PrEP.  Osteoporosis is a disease in which the bones lose minerals and strength with aging. This can result in serious bone fractures or breaks. The risk of osteoporosis can be identified using a bone density scan. Women ages 67 years and over and women at risk for fractures or osteoporosis should discuss screening with their health care providers. Ask your health care provider whether you should take a calcium supplement or vitamin D to reduce the rate of osteoporosis.  Menopause can be associated with physical symptoms and risks. Hormone replacement therapy is available to decrease symptoms and risks. You should talk to your health care provider about whether hormone replacement therapy is right for you.  Use sunscreen. Apply sunscreen liberally and repeatedly throughout the day. You should seek shade when your shadow is shorter than you. Protect yourself by wearing long sleeves, pants, a wide-brimmed hat, and sunglasses year round, whenever you are outdoors.  Once a month, do a whole body skin exam, using a mirror to look at the skin on your back. Tell your health care provider of new moles, moles that have irregular borders, moles that  are larger than a pencil eraser, or moles that have changed in shape or color.  Stay current with required vaccines (immunizations).  Influenza vaccine. All adults should be immunized every year.  Tetanus, diphtheria, and acellular pertussis (Td, Tdap) vaccine. Pregnant women should receive 1 dose of Tdap vaccine during each pregnancy. The dose should be obtained regardless of the length of time since the last dose. Immunization is preferred during the 27th-36th week of gestation. An adult who has not previously received Tdap or who does not know her vaccine status should receive 1 dose of Tdap. This initial dose should be followed by tetanus and diphtheria toxoids (Td) booster doses every 10 years. Adults with an unknown or incomplete history of completing a 3-dose immunization series with Td-containing vaccines should begin or complete a primary immunization series including a Tdap dose. Adults should receive a Td booster every 10 years.  Varicella vaccine. An adult without evidence of immunity to varicella should receive 2 doses or a second dose if she has previously received 1 dose. Pregnant females who do not have evidence of immunity should receive the first dose after pregnancy. This first dose should be obtained before leaving the health care facility. The second dose should be obtained 4-8 weeks after the first dose.  Human papillomavirus (HPV) vaccine. Females aged 13-26 years who have not received the vaccine previously should obtain the 3-dose series. The vaccine is not recommended for use in pregnant females. However, pregnancy testing is not needed before receiving a dose. If a female is found to be pregnant after receiving a dose, no treatment is needed. In that case, the remaining doses should be delayed until after the pregnancy. Immunization is recommended for any person with an immunocompromised condition through the age of 61 years if she did not get any or all doses earlier. During the  3-dose series, the second dose should be obtained 4-8 weeks after the first dose. The third dose should be obtained 24 weeks after the first dose and 16 weeks after the second dose.  Zoster vaccine. One dose is recommended for adults aged 30 years or older unless certain conditions are present.  Measles, mumps, and rubella (MMR) vaccine. Adults born  before 1957 generally are considered immune to measles and mumps. Adults born in 1957 or later should have 1 or more doses of MMR vaccine unless there is a contraindication to the vaccine or there is laboratory evidence of immunity to each of the three diseases. A routine second dose of MMR vaccine should be obtained at least 28 days after the first dose for students attending postsecondary schools, health care workers, or international travelers. People who received inactivated measles vaccine or an unknown type of measles vaccine during 1963-1967 should receive 2 doses of MMR vaccine. People who received inactivated mumps vaccine or an unknown type of mumps vaccine before 1979 and are at high risk for mumps infection should consider immunization with 2 doses of MMR vaccine. For females of childbearing age, rubella immunity should be determined. If there is no evidence of immunity, females who are not pregnant should be vaccinated. If there is no evidence of immunity, females who are pregnant should delay immunization until after pregnancy. Unvaccinated health care workers born before 1957 who lack laboratory evidence of measles, mumps, or rubella immunity or laboratory confirmation of disease should consider measles and mumps immunization with 2 doses of MMR vaccine or rubella immunization with 1 dose of MMR vaccine.  Pneumococcal 13-valent conjugate (PCV13) vaccine. When indicated, a person who is uncertain of his immunization history and has no record of immunization should receive the PCV13 vaccine. All adults 65 years of age and older should receive this  vaccine. An adult aged 19 years or older who has certain medical conditions and has not been previously immunized should receive 1 dose of PCV13 vaccine. This PCV13 should be followed with a dose of pneumococcal polysaccharide (PPSV23) vaccine. Adults who are at high risk for pneumococcal disease should obtain the PPSV23 vaccine at least 8 weeks after the dose of PCV13 vaccine. Adults older than 80 years of age who have normal immune system function should obtain the PPSV23 vaccine dose at least 1 year after the dose of PCV13 vaccine.  Pneumococcal polysaccharide (PPSV23) vaccine. When PCV13 is also indicated, PCV13 should be obtained first. All adults aged 65 years and older should be immunized. An adult younger than age 65 years who has certain medical conditions should be immunized. Any person who resides in a nursing home or long-term care facility should be immunized. An adult smoker should be immunized. People with an immunocompromised condition and certain other conditions should receive both PCV13 and PPSV23 vaccines. People with human immunodeficiency virus (HIV) infection should be immunized as soon as possible after diagnosis. Immunization during chemotherapy or radiation therapy should be avoided. Routine use of PPSV23 vaccine is not recommended for American Indians, [place] Natives, or people younger than 65 years unless there are medical conditions that require PPSV23 vaccine. When indicated, people who have unknown immunization and have no record of immunization should receive PPSV23 vaccine. One-time revaccination 5 years after the first dose of PPSV23 is recommended for people aged 19-64 years who have chronic kidney failure, nephrotic syndrome, asplenia, or immunocompromised conditions. People who received 1-2 doses of PPSV23 before age 65 years should receive another dose of PPSV23 vaccine at age 65 years or later if at least 5 years have passed since the previous dose. Doses of PPSV23 are not  needed for people immunized with PPSV23 at or after age 65 years.  Meningococcal vaccine. Adults with asplenia or persistent complement component deficiencies should receive 2 doses of quadrivalent meningococcal conjugate (MenACWY-D) vaccine. The doses should be obtained   at least 2 months apart. Microbiologists working with certain meningococcal bacteria, [place] recruits, people at risk during an outbreak, and people who travel to or live in countries with a high rate of meningitis should be immunized. A first-year college student up through age 34 years who is living in a residence hall should receive a dose if she did not receive a dose on or after her 16th birthday. Adults who have certain high-risk conditions should receive one or more doses of vaccine.  Hepatitis A vaccine. Adults who wish to be protected from this disease, have certain high-risk conditions, work with hepatitis A-infected animals, work in hepatitis A research labs, or travel to or work in countries with a high rate of hepatitis A should be immunized. Adults who were previously unvaccinated and who anticipate close contact with an international adoptee during the first 60 days after arrival in the [place] from a country with a high rate of hepatitis A should be immunized.  Hepatitis B vaccine. Adults who wish to be protected from this disease, have certain high-risk conditions, may be exposed to blood or other infectious body fluids, are household contacts or sex partners of hepatitis B positive people, are clients or workers in certain care facilities, or travel to or work in countries with a high rate of hepatitis B should be immunized.  Haemophilus influenzae type b (Hib) vaccine. A previously unvaccinated person with asplenia or sickle cell disease or having a scheduled splenectomy should receive 1 dose of Hib vaccine. Regardless of previous immunization, a recipient of a hematopoietic stem cell transplant should receive a  3-dose series 6-12 months after her successful transplant. Hib vaccine is not recommended for adults with HIV infection. Preventive Services / Frequency Ages 35 to 4 years  Blood pressure check.** / Every 3-5 years.  Lipid and cholesterol check.** / Every 5 years beginning at age 60.  Clinical breast exam.** / Every 3 years for women in their 71s and 10s.  BRCA-related cancer risk assessment.** / For women who have family members with a BRCA-related cancer (breast, ovarian, tubal, or peritoneal cancers).  Pap test.** / Every 2 years from ages 76 through 26. Every 3 years starting at age 61 through age 76 or 93 with a history of 3 consecutive normal Pap tests.  HPV screening.** / Every 3 years from ages 37 through ages 60 to 51 with a history of 3 consecutive normal Pap tests.  Hepatitis C blood test.** / For any individual with known risks for hepatitis C.  Skin self-exam. / Monthly.  Influenza vaccine. / Every year.  Tetanus, diphtheria, and acellular pertussis (Tdap, Td) vaccine.** / Consult your health care provider. Pregnant women should receive 1 dose of Tdap vaccine during each pregnancy. 1 dose of Td every 10 years.  Varicella vaccine.** / Consult your health care provider. Pregnant females who do not have evidence of immunity should receive the first dose after pregnancy.  HPV vaccine. / 3 doses over 6 months, if 93 and younger. The vaccine is not recommended for use in pregnant females. However, pregnancy testing is not needed before receiving a dose.  Measles, mumps, rubella (MMR) vaccine.** / You need at least 1 dose of MMR if you were born in 1957 or later. You may also need a 2nd dose. For females of childbearing age, rubella immunity should be determined. If there is no evidence of immunity, females who are not pregnant should be vaccinated. If there is no evidence of immunity, females who are  pregnant should delay immunization until after pregnancy.  Pneumococcal  13-valent conjugate (PCV13) vaccine.** / Consult your health care provider.  Pneumococcal polysaccharide (PPSV23) vaccine.** / 1 to 2 doses if you smoke cigarettes or if you have certain conditions.  Meningococcal vaccine.** / 1 dose if you are age 68 to 8 years and a Market researcher living in a residence hall, or have one of several medical conditions, you need to get vaccinated against meningococcal disease. You may also need additional booster doses.  Hepatitis A vaccine.** / Consult your health care provider.  Hepatitis B vaccine.** / Consult your health care provider.  Haemophilus influenzae type b (Hib) vaccine.** / Consult your health care provider. Ages 7 to 53 years  Blood pressure check.** / Every year.  Lipid and cholesterol check.** / Every 5 years beginning at age 25 years.  Lung cancer screening. / Every year if you are aged 11-80 years and have a 30-pack-year history of smoking and currently smoke or have quit within the past 15 years. Yearly screening is stopped once you have quit smoking for at least 15 years or develop a health problem that would prevent you from having lung cancer treatment.  Clinical breast exam.** / Every year after age 48 years.  BRCA-related cancer risk assessment.** / For women who have family members with a BRCA-related cancer (breast, ovarian, tubal, or peritoneal cancers).  Mammogram.** / Every year beginning at age 41 years and continuing for as long as you are in good health. Consult with your health care provider.  Pap test.** / Every 3 years starting at age 65 years through age 37 or 70 years with a history of 3 consecutive normal Pap tests.  HPV screening.** / Every 3 years from ages 72 years through ages 60 to 40 years with a history of 3 consecutive normal Pap tests.  Fecal occult blood test (FOBT) of stool. / Every year beginning at age 21 years and continuing until age 5 years. You may not need to do this test if you get  a colonoscopy every 10 years.  Flexible sigmoidoscopy or colonoscopy.** / Every 5 years for a flexible sigmoidoscopy or every 10 years for a colonoscopy beginning at age 35 years and continuing until age 48 years.  Hepatitis C blood test.** / For all people born from [birth year] through 1965 and any individual with known risks for hepatitis C.  Skin self-exam. / Monthly.  Influenza vaccine. / Every year.  Tetanus, diphtheria, and acellular pertussis (Tdap/Td) vaccine.** / Consult your health care provider. Pregnant women should receive 1 dose of Tdap vaccine during each pregnancy. 1 dose of Td every 10 years.  Varicella vaccine.** / Consult your health care provider. Pregnant females who do not have evidence of immunity should receive the first dose after pregnancy.  Zoster vaccine.** / 1 dose for adults aged 30 years or older.  Measles, mumps, rubella (MMR) vaccine.** / You need at least 1 dose of MMR if you were born in 1957 or later. You may also need a second dose. For females of childbearing age, rubella immunity should be determined. If there is no evidence of immunity, females who are not pregnant should be vaccinated. If there is no evidence of immunity, females who are pregnant should delay immunization until after pregnancy.  Pneumococcal 13-valent conjugate (PCV13) vaccine.** / Consult your health care provider.  Pneumococcal polysaccharide (PPSV23) vaccine.** / 1 to 2 doses if you smoke cigarettes or if you have certain conditions.  Meningococcal vaccine.** /  Consult your health care provider.  Hepatitis A vaccine.** / Consult your health care provider.  Hepatitis B vaccine.** / Consult your health care provider.  Haemophilus influenzae type b (Hib) vaccine.** / Consult your health care provider. Ages 64 years and over  Blood pressure check.** / Every year.  Lipid and cholesterol check.** / Every 5 years beginning at age 23 years.  Lung cancer screening. / Every year if you  are aged 16-80 years and have a 30-pack-year history of smoking and currently smoke or have quit within the past 15 years. Yearly screening is stopped once you have quit smoking for at least 15 years or develop a health problem that would prevent you from having lung cancer treatment.  Clinical breast exam.** / Every year after age 74 years.  BRCA-related cancer risk assessment.** / For women who have family members with a BRCA-related cancer (breast, ovarian, tubal, or peritoneal cancers).  Mammogram.** / Every year beginning at age 44 years and continuing for as long as you are in good health. Consult with your health care provider.  Pap test.** / Every 3 years starting at age 58 years through age 22 or 39 years with 3 consecutive normal Pap tests. Testing can be stopped between 65 and 70 years with 3 consecutive normal Pap tests and no abnormal Pap or HPV tests in the past 10 years.  HPV screening.** / Every 3 years from ages 64 years through ages 70 or 61 years with a history of 3 consecutive normal Pap tests. Testing can be stopped between 65 and 70 years with 3 consecutive normal Pap tests and no abnormal Pap or HPV tests in the past 10 years.  Fecal occult blood test (FOBT) of stool. / Every year beginning at age 40 years and continuing until age 27 years. You may not need to do this test if you get a colonoscopy every 10 years.  Flexible sigmoidoscopy or colonoscopy.** / Every 5 years for a flexible sigmoidoscopy or every 10 years for a colonoscopy beginning at age 7 years and continuing until age 32 years.  Hepatitis C blood test.** / For all people born from [birth year] through 1963 and any individual with known risks for hepatitis C.  Osteoporosis screening.** / A one-time screening for women ages 30 years and over and women at risk for fractures or osteoporosis.  Skin self-exam. / Monthly.  Influenza vaccine. / Every year.  Tetanus, diphtheria, and acellular pertussis (Tdap/Td)  vaccine.** / 1 dose of Td every 10 years.  Varicella vaccine.** / Consult your health care provider.  Zoster vaccine.** / 1 dose for adults aged 35 years or older.  Pneumococcal 13-valent conjugate (PCV13) vaccine.** / Consult your health care provider.  Pneumococcal polysaccharide (PPSV23) vaccine.** / 1 dose for all adults aged 46 years and older.  Meningococcal vaccine.** / Consult your health care provider.  Hepatitis A vaccine.** / Consult your health care provider.  Hepatitis B vaccine.** / Consult your health care provider.  Haemophilus influenzae type b (Hib) vaccine.** / Consult your health care provider. ** Family history and personal history of risk and conditions may change your health care provider's recommendations.   This information is not intended to replace advice given to you by your health care provider. Make sure you discuss any questions you have with your health care provider.   Document Released: 06/29/2001 Document Revised: 05/24/2014 Document Reviewed: 09/28/2010 Elsevier Interactive Patient Education [employer].

## 2015-11-26 LAB — CBC
HCT: 37.3 % (ref 36.0–46.0)
Hemoglobin: 12.5 g/dL (ref 12.0–15.0)
MCHC: 33.4 g/dL (ref 30.0–36.0)
MCV: 96.2 fl (ref 78.0–100.0)
PLATELETS: 185 10*3/uL (ref 150.0–400.0)
RBC: 3.88 Mil/uL (ref 3.87–5.11)
RDW: 13.8 % (ref 11.5–15.5)
WBC: 5.7 10*3/uL (ref 4.0–10.5)

## 2015-12-07 ENCOUNTER — Encounter: Payer: Self-pay | Admitting: Family Medicine

## 2015-12-07 DIAGNOSIS — N3281 Overactive bladder: Secondary | ICD-10-CM | POA: Insufficient documentation

## 2015-12-07 DIAGNOSIS — T7840XA Allergy, unspecified, initial encounter: Secondary | ICD-10-CM | POA: Insufficient documentation

## 2015-12-07 NOTE — Progress Notes (Signed)
Patient ID: Holly Nunez, female   DOB: 18-Aug-1924, 81 y.o.   MRN: [id number]   Subjective:    Patient ID: Holly Nunez, female    DOB: 02-25-1925, 82 y.o.   MRN: [id number]  Chief Complaint  Patient presents with  . Annual Exam    HPI Patient is in today for annual exam and follow up on numerous medical concerns. No recent illness or hospitalization. Is frustrated with her overactive bladder symptoms, noting frequency and urgency. No polyurea or polydipsia. Denies CP/palp/SOB/HA/congestion/fevers/GI c/o. Taking meds as prescribed. Doing well with ADLs at home.   Past Medical History:  Diagnosis Date  . Arthritis   . H/O measles   . H/O mumps   . History of chicken pox   . Hyperlipidemia   . Hypertension   . Patent foramen ovale   . Preventative health care 02/26/2015  . Renal insufficiency   . Thyroid nodule    Biopsy-negative  . TIA (transient ischemic attack)   . Urinary incontinence     Past Surgical History:  Procedure Laterality Date  . ABDOMINAL HYSTERECTOMY     fibroid  . APPENDECTOMY    . EYE SURGERY Bilateral    CATARACT REMOVAL  . JOINT REPLACEMENT    . REPLACEMENT TOTAL KNEE Right 2007  . THYROID LOBECTOMY Left 04/25/2015  . THYROID LOBECTOMY Left 04/25/2015   Procedure: LEFT THYROID LOBECTOMY;  Surgeon: Armandina Gemma, MD;  Location: [facility];  Service: General;  Laterality: Left;  . TRIGGER FINGER RELEASE  2007    Family History  Problem Relation Age of Onset  . Heart disease Mother   . Hypertension Mother   . Kidney disease Sister   . Mental illness Sister     suicide, depresion  . Gout Brother   . Hypertension Son   . Cancer Maternal Grandmother   . Cancer Sister   . Bone cancer Sister   . Cancer Sister   . Cancer Sister     kidney  . Cancer Daughter   . Pancreatic cancer Daughter   . Thyroid disease Daughter   . Thyroid disease Daughter     thyroid nodules  . Hypertension Daughter   . Irritable bowel syndrome Daughter   . Cancer  Daughter     thyroid  . Cancer Brother   . Prostate cancer Brother     Social History   Social History  . Marital status: Widowed    Spouse name: N/A  . Number of children: 4  . Years of education: N/A   Occupational History  . Not on file.   Social History Main Topics  . Smoking status: Never Smoker  . Smokeless tobacco: Never Used  . Alcohol use No  . Drug use: No  . Sexual activity: Not Currently    Partners: Male     Comment: no dietary restrictions, lives with 2 daughters   Other Topics Concern  . Not on file   Social History Narrative   No dietary restrictions and lives with daughters.    Outpatient Medications Prior to Visit  Medication Sig Dispense Refill  . acetaminophen (TYLENOL) 650 MG CR tablet Take 1,300 mg by mouth every 8 (eight) hours as needed for pain.    Marland Kitchen atorvastatin (LIPITOR) 40 MG tablet Take 1 tablet (40 mg total) by mouth daily. 30 tablet 6  . brimonidine (ALPHAGAN) 0.15 % ophthalmic solution Place 1 drop into both eyes 3 (three) times daily.    . carvedilol (COREG) 3.125 MG tablet Take  2 tablets bid 120 tablet 6  . HYDROcodone-acetaminophen (NORCO/VICODIN) 5-325 MG tablet Take 1-2 tablets by mouth every 4 (four) hours as needed for moderate pain. 30 tablet 0  . nitroGLYCERIN (NITROLINGUAL) 0.4 MG/SPRAY spray Place 1 spray under the tongue every 5 (five) minutes x 3 doses as needed for chest pain.    . Rivaroxaban (XARELTO) 15 MG TABS tablet Take 1 tablet (15 mg total) by mouth daily with supper. 90 tablet 3  . triamterene-hydrochlorothiazide (MAXZIDE-25) 37.5-25 MG tablet Take 1 tablet by mouth daily. 30 tablet 6  . oxybutynin (DITROPAN XL) 5 MG 24 hr tablet Take 1 tablet (5 mg total) by mouth daily. Take one daily 30 tablet 6   No facility-administered medications prior to visit.     No Known Allergies  Review of Systems  Constitutional: Negative for chills, fever and malaise/fatigue.  HENT: Negative for congestion and hearing loss.     Eyes: Negative for discharge.  Respiratory: Negative for cough, sputum production and shortness of breath.   Cardiovascular: Negative for chest pain, palpitations and leg swelling.  Gastrointestinal: Negative for abdominal pain, blood in stool, constipation, diarrhea, heartburn, nausea and vomiting.  Genitourinary: Negative for dysuria, frequency, hematuria and urgency.  Musculoskeletal: Negative for back pain, falls and myalgias.  Skin: Negative for rash.  Neurological: Negative for dizziness, sensory change, loss of consciousness, weakness and headaches.  Endo/Heme/Allergies: Negative for environmental allergies. Does not bruise/bleed easily.  Psychiatric/Behavioral: Negative for depression and suicidal ideas. The patient is not nervous/anxious and does not have insomnia.        Objective:    Physical Exam  Constitutional: She is oriented to person, place, and time. She appears well-developed and well-nourished. No distress.  HENT:  Head: Normocephalic and atraumatic.  Eyes: Conjunctivae are normal.  Neck: Neck supple. No thyromegaly present.  Cardiovascular: Normal rate, regular rhythm and normal heart sounds.   Pulmonary/Chest: Effort normal and breath sounds normal. No respiratory distress.  Abdominal: Soft. Bowel sounds are normal. She exhibits no distension and no mass. There is no tenderness.  Musculoskeletal: She exhibits no edema.  Lymphadenopathy:    She has no cervical adenopathy.  Neurological: She is alert and oriented to person, place, and time.  Skin: Skin is warm and dry.  Psychiatric: She has a normal mood and affect. Her behavior is normal.    BP 108/72   Pulse 64   Temp 98 F (36.7 C) (Oral)   Ht 5' (1.524 m)   Wt 142 lb 8 oz (64.6 kg)   SpO2 95%   BMI 27.83 kg/m  Wt Readings from Last 3 Encounters:  11/25/15 142 lb 8 oz (64.6 kg)  09/10/15 141 lb 1.9 oz (64 kg)  07/25/15 144 lb (65.3 kg)     Lab Results  Component Value Date   WBC 5.7  11/25/2015   HGB 12.5 11/25/2015   HCT 37.3 11/25/2015   PLT 185.0 11/25/2015   GLUCOSE 121 (H) 11/25/2015   CHOL 150 11/25/2015   TRIG 115.0 11/25/2015   HDL 69.70 11/25/2015   LDLCALC 57 11/25/2015   ALT 14 11/25/2015   AST 19 11/25/2015   NA 137 11/25/2015   K 4.2 11/25/2015   CL 102 11/25/2015   CREATININE 1.19 11/25/2015   BUN 24 (H) 11/25/2015   CO2 28 11/25/2015   TSH 1.67 11/25/2015   INR 1.13 04/25/2015    Lab Results  Component Value Date   TSH 1.67 11/25/2015   Lab Results  Component Value  Date   WBC 5.7 11/25/2015   HGB 12.5 11/25/2015   HCT 37.3 11/25/2015   MCV 96.2 11/25/2015   PLT 185.0 11/25/2015   Lab Results  Component Value Date   NA 137 11/25/2015   K 4.2 11/25/2015   CO2 28 11/25/2015   GLUCOSE 121 (H) 11/25/2015   BUN 24 (H) 11/25/2015   CREATININE 1.19 11/25/2015   BILITOT 0.5 11/25/2015   ALKPHOS 57 11/25/2015   AST 19 11/25/2015   ALT 14 11/25/2015   PROT 7.6 11/25/2015   ALBUMIN 4.3 11/25/2015   CALCIUM 10.3 11/25/2015   ANIONGAP 9 04/18/2015   GFR 54.96 (L) 11/25/2015   Lab Results  Component Value Date   CHOL 150 11/25/2015   Lab Results  Component Value Date   HDL 69.70 11/25/2015   Lab Results  Component Value Date   LDLCALC 57 11/25/2015   Lab Results  Component Value Date   TRIG 115.0 11/25/2015   Lab Results  Component Value Date   CHOLHDL 2 11/25/2015   No results found for: HGBA1C     Assessment & Plan:   Problem List Items Addressed This Visit    HTN (hypertension)    Well controlled, no changes to meds. Encouraged heart healthy diet such as the DASH diet and exercise as tolerated.       Relevant Medications   oxybutynin (DITROPAN XL) 10 MG 24 hr tablet   Other Relevant Orders   TSH (Completed)   CBC (Completed)   Lipid panel (Completed)   Comprehensive metabolic panel (Completed)   Hyperlipidemia    Tolerating statin, encouraged heart healthy diet, avoid trans fats, minimize simple carbs and  saturated fats. Increase exercise as tolerated      Relevant Medications   oxybutynin (DITROPAN XL) 10 MG 24 hr tablet   Other Relevant Orders   TSH (Completed)   CBC (Completed)   Lipid panel (Completed)   Comprehensive metabolic panel (Completed)   DJD (degenerative joint disease)    Encouraged moist heat and gentle stretching as tolerated. May try NSAIDs and prescription meds as directed and report if symptoms worsen or seek immediate care      Anemia - Primary    Increase leafy greens, consider increased lean red meat and using cast iron cookware. Continue to monitor, report any concerns      Relevant Medications   oxybutynin (DITROPAN XL) 10 MG 24 hr tablet   Other Relevant Orders   TSH (Completed)   CBC (Completed)   Lipid panel (Completed)   Comprehensive metabolic panel (Completed)   Renal insufficiency   Relevant Medications   oxybutynin (DITROPAN XL) 10 MG 24 hr tablet   Other Relevant Orders   TSH (Completed)   CBC (Completed)   Lipid panel (Completed)   Comprehensive metabolic panel (Completed)   Preventative health care    Patient encouraged to maintain heart healthy diet, regular exercise, adequate sleep. Consider daily probiotics. Take medications as prescribed. Labs reviewed      Overactive bladder    Given prescription for Oxybutynin to try      Relevant Medications   oxybutynin (DITROPAN XL) 10 MG 24 hr tablet   Other Relevant Orders   TSH (Completed)   CBC (Completed)   Lipid panel (Completed)   Comprehensive metabolic panel (Completed)   Allergic state   Relevant Medications   fluticasone (FLONASE) 50 MCG/ACT nasal spray    Other Visit Diagnoses    Need for vaccination with 13-polyvalent pneumococcal conjugate vaccine  Relevant Orders   Pneumococcal conjugate vaccine 13-valent (Completed)      I have discontinued Ms. Pavao's oxybutynin. I am also having her start on oxybutynin and fluticasone. Additionally, I am having her maintain  her nitroGLYCERIN, brimonidine, acetaminophen, HYDROcodone-acetaminophen, triamterene-hydrochlorothiazide, carvedilol, atorvastatin, and Rivaroxaban.  Meds ordered this encounter  Medications  . oxybutynin (DITROPAN XL) 10 MG 24 hr tablet    Sig: Take 1 tablet (10 mg total) by mouth at bedtime.    Dispense:  30 tablet    Refill:  5  . fluticasone (FLONASE) 50 MCG/ACT nasal spray    Sig: Place 2 sprays into both nostrils daily.    Dispense:  16 g    Refill:  6     Penni Homans, MD

## 2015-12-07 NOTE — Assessment & Plan Note (Signed)
Encouraged moist heat and gentle stretching as tolerated. May try NSAIDs and prescription meds as directed and report if symptoms worsen or seek immediate care 

## 2015-12-07 NOTE — Assessment & Plan Note (Signed)
Given prescription for Oxybutynin to try

## 2015-12-07 NOTE — Assessment & Plan Note (Signed)
Tolerating statin, encouraged heart healthy diet, avoid trans fats, minimize simple carbs and saturated fats. Increase exercise as tolerated 

## 2015-12-07 NOTE — Assessment & Plan Note (Signed)
Patient encouraged to maintain heart healthy diet, regular exercise, adequate sleep. Consider daily probiotics. Take medications as prescribed. Labs reviewed 

## 2015-12-07 NOTE — Assessment & Plan Note (Signed)
Well controlled, no changes to meds. Encouraged heart healthy diet such as the DASH diet and exercise as tolerated.  °

## 2015-12-07 NOTE — Assessment & Plan Note (Signed)
Increase leafy greens, consider increased lean red meat and using cast iron cookware. Continue to monitor, report any concerns 

## 2016-02-25 ENCOUNTER — Other Ambulatory Visit: Payer: Self-pay | Admitting: Family Medicine

## 2016-02-28 DIAGNOSIS — Z23 Encounter for immunization: Secondary | ICD-10-CM | POA: Diagnosis not present

## 2016-03-01 NOTE — Progress Notes (Signed)
HPI: FU hypertension, patent foramen ovale and prior TIA. Patient previously lived in [place]. Echocardiogram in November 2014 showed normal LV function, mild to moderate left ventricular hypertrophy, grade 2 diastolic dysfunction, aneurysmal atrial septum with PFO. There was moderate atherosclerosis in the aortic arch. She has a history of TIAs; Initially treated with Plavix and aspirin but recurrence and now on xarelto. CT 9/16 showed thyromegaly and coronary calcification. Nuclear study 10/16 showed EF 75 with normal perfusion. Since last seen, the patient denies any dyspnea on exertion, orthopnea, PND, pedal edema, palpitations, syncope or chest pain.   Current Outpatient Prescriptions  Medication Sig Dispense Refill  . acetaminophen (TYLENOL) 650 MG CR tablet Take 1,300 mg by mouth every 8 (eight) hours as needed for pain.    Marland Kitchen atorvastatin (LIPITOR) 40 MG tablet Take 1 tablet (40 mg total) by mouth daily. 30 tablet 6  . brimonidine (ALPHAGAN) 0.15 % ophthalmic solution Place 1 drop into both eyes 3 (three) times daily.    . carvedilol (COREG) 3.125 MG tablet take 2 tablets by mouth twice a day 120 tablet 0  . fluticasone (FLONASE) 50 MCG/ACT nasal spray Place 2 sprays into both nostrils daily. 16 g 6  . HYDROcodone-acetaminophen (NORCO/VICODIN) 5-325 MG tablet Take 1-2 tablets by mouth every 4 (four) hours as needed for moderate pain. 30 tablet 0  . nitroGLYCERIN (NITROLINGUAL) 0.4 MG/SPRAY spray Place 1 spray under the tongue every 5 (five) minutes x 3 doses as needed for chest pain.    Marland Kitchen oxybutynin (DITROPAN XL) 10 MG 24 hr tablet Take 1 tablet (10 mg total) by mouth at bedtime. 30 tablet 5  . Rivaroxaban (XARELTO) 15 MG TABS tablet Take 1 tablet (15 mg total) by mouth daily with supper. 90 tablet 3  . triamterene-hydrochlorothiazide (MAXZIDE-25) 37.5-25 MG tablet Take 1 tablet by mouth daily. 30 tablet 6   No current facility-administered medications for this visit.       Past Medical History:  Diagnosis Date  . Arthritis   . H/O measles   . H/O mumps   . History of chicken pox   . Hyperlipidemia   . Hypertension   . Patent foramen ovale   . Preventative health care 02/26/2015  . Renal insufficiency   . Thyroid nodule    Biopsy-negative  . TIA (transient ischemic attack)   . Urinary incontinence     Past Surgical History:  Procedure Laterality Date  . ABDOMINAL HYSTERECTOMY     fibroid  . APPENDECTOMY    . EYE SURGERY Bilateral    CATARACT REMOVAL  . JOINT REPLACEMENT    . REPLACEMENT TOTAL KNEE Right 2007  . THYROID LOBECTOMY Left 04/25/2015  . THYROID LOBECTOMY Left 04/25/2015   Procedure: LEFT THYROID LOBECTOMY;  Surgeon: Armandina Gemma, MD;  Location: [facility];  Service: General;  Laterality: Left;  . TRIGGER FINGER RELEASE  2007    Social History   Social History  . Marital status: Widowed    Spouse name: N/A  . Number of children: 4  . Years of education: N/A   Occupational History  . Not on file.   Social History Main Topics  . Smoking status: Never Smoker  . Smokeless tobacco: Never Used  . Alcohol use No  . Drug use: No  . Sexual activity: Not Currently    Partners: Male     Comment: no dietary restrictions, lives with 2 daughters   Other Topics Concern  . Not on file  Social History Narrative   No dietary restrictions and lives with daughters.    Family History  Problem Relation Age of Onset  . Heart disease Mother   . Hypertension Mother   . Kidney disease Sister   . Mental illness Sister     suicide, depresion  . Gout Brother   . Hypertension Son   . Cancer Maternal Grandmother   . Cancer Sister   . Bone cancer Sister   . Cancer Sister   . Cancer Sister     kidney  . Cancer Daughter   . Pancreatic cancer Daughter   . Thyroid disease Daughter   . Thyroid disease Daughter     thyroid nodules  . Hypertension Daughter   . Irritable bowel syndrome Daughter   . Cancer Daughter     thyroid  .  Cancer Brother   . Prostate cancer Brother     ROS: no fevers or chills, productive cough, hemoptysis, dysphasia, odynophagia, melena, hematochezia, dysuria, hematuria, rash, seizure activity, orthopnea, PND, pedal edema, claudication. Remaining systems are negative.  Physical Exam: Well-developed well-nourished in no acute distress.  Skin is warm and dry.  HEENT is normal.  Neck is supple.  Chest is clear to auscultation with normal expansion.  Cardiovascular exam is regular rate and rhythm.  Abdominal exam nontender or distended. No masses palpated. Extremities show no edema. neuro grossly intact  ECG-sinus bradycardia at a rate of 59. Normal axis. No ST changes.  A/P  1 prior TIA-patient had TIAs in the setting of patent foramen ovale and atrial septal aneurysm documented at outside hospital. She had a repeat TIA on aspirin and Plavix. She has therefore been anticoagulated. Continue xarelto. The price of this has increased and we have provided pradaxa and apixaban. They will check with her insurance company and if cheaper we will transition.  2 hyperlipidemia-continue statin.  3 hypertension-blood pressure controlled. Continue present medications.  Kirk Ruths, MD

## 2016-03-10 ENCOUNTER — Ambulatory Visit (INDEPENDENT_AMBULATORY_CARE_PROVIDER_SITE_OTHER): Payer: Medicare Other | Admitting: Cardiology

## 2016-03-10 ENCOUNTER — Encounter: Payer: Self-pay | Admitting: Cardiology

## 2016-03-10 VITALS — BP 136/80 | HR 59 | Ht 60.0 in | Wt 135.8 lb

## 2016-03-10 DIAGNOSIS — G458 Other transient cerebral ischemic attacks and related syndromes: Secondary | ICD-10-CM | POA: Diagnosis not present

## 2016-03-10 DIAGNOSIS — E7800 Pure hypercholesterolemia, unspecified: Secondary | ICD-10-CM | POA: Diagnosis not present

## 2016-03-10 DIAGNOSIS — I10 Essential (primary) hypertension: Secondary | ICD-10-CM | POA: Diagnosis not present

## 2016-03-10 NOTE — Patient Instructions (Signed)
Medication Instructions:   ELIQUIS OR PRADAXA TO REPLACE XARELTO  Follow-Up:  Your physician wants you to follow-up in: [place] will receive a reminder letter in the mail two months in advance. If you don't receive a letter, please call our office to schedule the follow-up appointment.   If you need a refill on your cardiac medications before your next appointment, please call your pharmacy.

## 2016-03-11 ENCOUNTER — Other Ambulatory Visit: Payer: Self-pay | Admitting: Family Medicine

## 2016-03-26 ENCOUNTER — Other Ambulatory Visit: Payer: Self-pay | Admitting: Family Medicine

## 2016-05-25 ENCOUNTER — Telehealth: Payer: Self-pay | Admitting: *Deleted

## 2016-05-25 NOTE — Telephone Encounter (Signed)
Notified pt's Daughter AWV need was an error. She has already had AWV in 11/2015.Pt will come at 10 to see Dr.Blyth as scheduled.

## 2016-05-25 NOTE — Telephone Encounter (Signed)
Scheduled 05/27/16 @9 . Confirmed with pt's daughter.

## 2016-05-27 ENCOUNTER — Encounter: Payer: Self-pay | Admitting: Family Medicine

## 2016-05-27 ENCOUNTER — Ambulatory Visit (INDEPENDENT_AMBULATORY_CARE_PROVIDER_SITE_OTHER): Payer: Medicare Other | Admitting: Family Medicine

## 2016-05-27 DIAGNOSIS — N289 Disorder of kidney and ureter, unspecified: Secondary | ICD-10-CM | POA: Diagnosis not present

## 2016-05-27 DIAGNOSIS — R739 Hyperglycemia, unspecified: Secondary | ICD-10-CM | POA: Insufficient documentation

## 2016-05-27 DIAGNOSIS — E7800 Pure hypercholesterolemia, unspecified: Secondary | ICD-10-CM

## 2016-05-27 DIAGNOSIS — I10 Essential (primary) hypertension: Secondary | ICD-10-CM | POA: Diagnosis not present

## 2016-05-27 DIAGNOSIS — E2839 Other primary ovarian failure: Secondary | ICD-10-CM

## 2016-05-27 HISTORY — DX: Other primary ovarian failure: E28.39

## 2016-05-27 HISTORY — DX: Hyperglycemia, unspecified: R73.9

## 2016-05-27 LAB — COMPREHENSIVE METABOLIC PANEL
ALT: 14 U/L (ref 0–35)
AST: 20 U/L (ref 0–37)
Albumin: 4.4 g/dL (ref 3.5–5.2)
Alkaline Phosphatase: 58 U/L (ref 39–117)
BILIRUBIN TOTAL: 0.7 mg/dL (ref 0.2–1.2)
BUN: 27 mg/dL — AB (ref 6–23)
CHLORIDE: 102 meq/L (ref 96–112)
CO2: 28 meq/L (ref 19–32)
CREATININE: 1.21 mg/dL — AB (ref 0.40–1.20)
Calcium: 10.5 mg/dL (ref 8.4–10.5)
GFR: 53.85 mL/min — ABNORMAL LOW (ref >60.00)
Glucose, Bld: 114 mg/dL — ABNORMAL HIGH (ref 70–99)
Potassium: 5.2 mEq/L — ABNORMAL HIGH (ref 3.5–5.1)
SODIUM: 139 meq/L (ref 135–145)
Total Protein: 7.4 g/dL (ref 6.0–8.3)

## 2016-05-27 LAB — LIPID PANEL
CHOL/HDL RATIO: 2
Cholesterol: 160 mg/dL (ref 0–200)
HDL: 70.3 mg/dL (ref >39.00)
LDL CALC: 67 mg/dL (ref 0–99)
NONHDL: 89.81
Triglycerides: 113 mg/dL (ref 0.0–149.0)
VLDL: 22.6 mg/dL (ref 0.0–40.0)

## 2016-05-27 LAB — HEMOGLOBIN A1C: HEMOGLOBIN A1C: 5.5 % (ref 4.6–6.5)

## 2016-05-27 LAB — TSH: TSH: 1.55 u[IU]/mL (ref 0.35–4.50)

## 2016-05-27 MED ORDER — CARVEDILOL 3.125 MG PO TABS: 6.2500 mg | ORAL_TABLET | Freq: Two times a day (BID) | ORAL | 2 refills | Status: DC

## 2016-05-27 NOTE — Progress Notes (Signed)
Subjective:    Patient ID: Holly Nunez, female    DOB: [birth year], 83 y.o.   MRN: [id number]  Chief Complaint  Patient presents with  . Follow-up    HPI Patient is in today for 6 month follow up patient has no acute concerns. She feels well today. She is eating well and staying active. No recent febrile illness or hospitalizations. Denies CP/palp/SOB/HA/congestion/fevers/GI or GU c/o. Taking meds as prescribed  Past Medical History:  Diagnosis Date  . Arthritis   . Estrogen deficiency 05/27/2016  . H/O measles   . H/O mumps   . History of chicken pox   . Hyperglycemia 05/27/2016  . Hyperlipidemia   . Hypertension   . Patent foramen ovale   . Preventative health care 02/26/2015  . Renal insufficiency   . Thyroid nodule    Biopsy-negative  . TIA (transient ischemic attack)   . Urinary incontinence     Past Surgical History:  Procedure Laterality Date  . ABDOMINAL HYSTERECTOMY     fibroid  . APPENDECTOMY    . EYE SURGERY Bilateral    CATARACT REMOVAL  . JOINT REPLACEMENT    . REPLACEMENT TOTAL KNEE Right 2007  . THYROID LOBECTOMY Left 04/25/2015  . THYROID LOBECTOMY Left 04/25/2015   Procedure: LEFT THYROID LOBECTOMY;  Surgeon: Armandina Gemma, MD;  Location: [facility];  Service: General;  Laterality: Left;  . TRIGGER FINGER RELEASE  2007    Family History  Problem Relation Age of Onset  . Heart disease Mother   . Hypertension Mother   . Kidney disease Sister   . Mental illness Sister     suicide, depresion  . Gout Brother   . Hypertension Son   . Cancer Maternal Grandmother   . Cancer Sister   . Bone cancer Sister   . Cancer Sister   . Cancer Sister     kidney  . Cancer Daughter   . Pancreatic cancer Daughter   . Thyroid disease Daughter   . Thyroid disease Daughter     thyroid nodules  . Hypertension Daughter   . Irritable bowel syndrome Daughter   . Cancer Daughter     thyroid  . Cancer Brother   . Prostate cancer Brother     Social History    Social History  . Marital status: Widowed    Spouse name: N/A  . Number of children: 4  . Years of education: N/A   Occupational History  . Not on file.   Social History Main Topics  . Smoking status: Never Smoker  . Smokeless tobacco: Never Used  . Alcohol use No  . Drug use: No  . Sexual activity: Not Currently    Partners: Male     Comment: no dietary restrictions, lives with 2 daughters   Other Topics Concern  . Not on file   Social History Narrative   No dietary restrictions and lives with daughters.    Outpatient Medications Prior to Visit  Medication Sig Dispense Refill  . acetaminophen (TYLENOL) 650 MG CR tablet Take 1,300 mg by mouth every 8 (eight) hours as needed for pain.    Marland Kitchen atorvastatin (LIPITOR) 40 MG tablet take 1 tablet by mouth once daily 30 tablet 6  . brimonidine (ALPHAGAN) 0.15 % ophthalmic solution Place 1 drop into both eyes 3 (three) times daily.    . dorzolamide (TRUSOPT) 2 % ophthalmic solution Place 1 drop into both eyes as directed.   0  . fluticasone (FLONASE) 50 MCG/ACT nasal  spray Place 2 sprays into both nostrils daily. 16 g 6  . latanoprost (XALATAN) 0.005 % ophthalmic solution Place 1 drop into both eyes as directed.   1  . nitroGLYCERIN (NITROLINGUAL) 0.4 MG/SPRAY spray Place 1 spray under the tongue every 5 (five) minutes x 3 doses as needed for chest pain.    Marland Kitchen oxybutynin (DITROPAN XL) 10 MG 24 hr tablet Take 1 tablet (10 mg total) by mouth at bedtime. 30 tablet 5  . Rivaroxaban (XARELTO) 15 MG TABS tablet Take 1 tablet (15 mg total) by mouth daily with supper. 90 tablet 3  . triamterene-hydrochlorothiazide (MAXZIDE-25) 37.5-25 MG tablet take 1 tablet by mouth once daily 30 tablet 6  . carvedilol (COREG) 3.125 MG tablet take 2 tablets by mouth twice a day 120 tablet 2   No facility-administered medications prior to visit.     No Known Allergies  Review of Systems  Constitutional: Negative for fever and malaise/fatigue.  HENT:  Negative for congestion.   Eyes: Negative for blurred vision.  Respiratory: Negative for cough and shortness of breath.   Cardiovascular: Negative for chest pain and palpitations.  Gastrointestinal: Negative for vomiting.  Musculoskeletal: Negative for back pain.  Neurological: Negative for headaches.       Objective:    Physical Exam  Constitutional: She is oriented to person, place, and time. She appears well-developed and well-nourished. No distress.  HENT:  Head: Normocephalic and atraumatic.  Eyes: Conjunctivae are normal.  Neck: Normal range of motion. No thyromegaly present.  Cardiovascular: Normal rate and regular rhythm.   Pulmonary/Chest: Effort normal and breath sounds normal. She has no wheezes.  Abdominal: Soft. Bowel sounds are normal. There is no tenderness.  Musculoskeletal: Normal range of motion. She exhibits no edema or deformity.  Neurological: She is alert and oriented to person, place, and time.  Skin: Skin is warm and dry. She is not diaphoretic.  Psychiatric: She has a normal mood and affect.    BP 128/72 (BP Location: Left Arm, Patient Position: Sitting, Cuff Size: Normal)   Pulse (!) 58   Temp 97.6 F (36.4 C) (Oral)   Wt 138 lb 12.8 oz (63 kg)   SpO2 97%   BMI 27.11 kg/m  Wt Readings from Last 3 Encounters:  05/27/16 138 lb 12.8 oz (63 kg)  03/10/16 135 lb 12.8 oz (61.6 kg)  11/25/15 142 lb 8 oz (64.6 kg)     Lab Results  Component Value Date   WBC 5.7 11/25/2015   HGB 12.5 11/25/2015   HCT 37.3 11/25/2015   PLT 185.0 11/25/2015   GLUCOSE 114 (H) 05/27/2016   CHOL 160 05/27/2016   TRIG 113.0 05/27/2016   HDL 70.30 05/27/2016   LDLCALC 67 05/27/2016   ALT 14 05/27/2016   AST 20 05/27/2016   NA 139 05/27/2016   K 5.2 (H) 05/27/2016   CL 102 05/27/2016   CREATININE 1.21 (H) 05/27/2016   BUN 27 (H) 05/27/2016   CO2 28 05/27/2016   TSH 1.55 05/27/2016   INR 1.13 04/25/2015   HGBA1C 5.5 05/27/2016    Lab Results  Component Value  Date   TSH 1.55 05/27/2016   Lab Results  Component Value Date   WBC 5.7 11/25/2015   HGB 12.5 11/25/2015   HCT 37.3 11/25/2015   MCV 96.2 11/25/2015   PLT 185.0 11/25/2015   Lab Results  Component Value Date   NA 139 05/27/2016   K 5.2 (H) 05/27/2016   CO2 28 05/27/2016  GLUCOSE 114 (H) 05/27/2016   BUN 27 (H) 05/27/2016   CREATININE 1.21 (H) 05/27/2016   BILITOT 0.7 05/27/2016   ALKPHOS 58 05/27/2016   AST 20 05/27/2016   ALT 14 05/27/2016   PROT 7.4 05/27/2016   ALBUMIN 4.4 05/27/2016   CALCIUM 10.5 05/27/2016   ANIONGAP 9 04/18/2015   GFR 53.85 (L) 05/27/2016   Lab Results  Component Value Date   CHOL 160 05/27/2016   Lab Results  Component Value Date   HDL 70.30 05/27/2016   Lab Results  Component Value Date   LDLCALC 67 05/27/2016   Lab Results  Component Value Date   TRIG 113.0 05/27/2016   Lab Results  Component Value Date   CHOLHDL 2 05/27/2016   Lab Results  Component Value Date   HGBA1C 5.5 05/27/2016   I acted as a Education administrator for Dr. Charlett Blake. Princess, RMA     Assessment & Plan:   Problem List Items Addressed This Visit    HTN (hypertension)    Well controlled, no changes to meds. Encouraged heart healthy diet such as the DASH diet and exercise as tolerated.       Relevant Medications   carvedilol (COREG) 3.125 MG tablet   Other Relevant Orders   Comprehensive metabolic panel (Completed)   Lipid panel (Completed)   TSH (Completed)   Hyperlipidemia    Tolerating statin, encouraged heart healthy diet, avoid trans fats, minimize simple carbs and saturated fats. Increase exercise as tolerated      Relevant Medications   carvedilol (COREG) 3.125 MG tablet   Renal insufficiency    Mild check cmp today which showed stable renal function but potassium up very slightly. Will repeat panel and have her minimize potassium in diet      Hyperglycemia    minimize simple carbs. Increase exercise as tolerated.       Relevant Orders    Hemoglobin A1c (Completed)   Estrogen deficiency    Encouraged to get adequate exercise, calcium and vitamin d intake. Check Dexa scan      Relevant Orders   DG Bone Density      I have changed Ms. Ponzo's carvedilol. I am also having her maintain her nitroGLYCERIN, brimonidine, acetaminophen, Rivaroxaban, oxybutynin, fluticasone, dorzolamide, latanoprost, atorvastatin, and triamterene-hydrochlorothiazide.  Meds ordered this encounter  Medications  . carvedilol (COREG) 3.125 MG tablet    Sig: Take 2 tablets (6.25 mg total) by mouth 2 (two) times daily.    Dispense:  120 tablet    Refill:  2    CMA served as scribe during this visit. History, Physical and Plan performed by medical provider. Documentation and orders reviewed and attested to.  Penni Homans, MD

## 2016-05-27 NOTE — Progress Notes (Signed)
Pre visit review using our clinic review tool, if applicable. No additional management support is needed unless otherwise documented below in the visit note. 

## 2016-05-27 NOTE — Assessment & Plan Note (Signed)
Tolerating statin, encouraged heart healthy diet, avoid trans fats, minimize simple carbs and saturated fats. Increase exercise as tolerated 

## 2016-05-27 NOTE — Assessment & Plan Note (Signed)
minimize simple carbs. Increase exercise as tolerated.  

## 2016-05-27 NOTE — Assessment & Plan Note (Signed)
Well controlled, no changes to meds. Encouraged heart healthy diet such as the DASH diet and exercise as tolerated.  °

## 2016-05-27 NOTE — Assessment & Plan Note (Addendum)
Mild check cmp today which showed stable renal function but potassium up very slightly. Will repeat panel and have her minimize potassium in diet

## 2016-05-27 NOTE — Patient Instructions (Signed)

## 2016-05-28 ENCOUNTER — Other Ambulatory Visit: Payer: Self-pay

## 2016-05-28 DIAGNOSIS — E875 Hyperkalemia: Secondary | ICD-10-CM

## 2016-05-31 NOTE — Assessment & Plan Note (Signed)
Encouraged to get adequate exercise, calcium and vitamin d intake. Check Dexa scan

## 2016-06-03 ENCOUNTER — Inpatient Hospital Stay (HOSPITAL_BASED_OUTPATIENT_CLINIC_OR_DEPARTMENT_OTHER): Admission: RE | Admit: 2016-06-03 | Payer: Medicare Other | Source: Ambulatory Visit

## 2016-06-04 ENCOUNTER — Other Ambulatory Visit: Payer: Medicare Other

## 2016-06-07 ENCOUNTER — Other Ambulatory Visit: Payer: Self-pay | Admitting: Family Medicine

## 2016-06-07 ENCOUNTER — Ambulatory Visit (HOSPITAL_BASED_OUTPATIENT_CLINIC_OR_DEPARTMENT_OTHER)
Admission: RE | Admit: 2016-06-07 | Discharge: 2016-06-07 | Disposition: A | Discharge status: Home / Self Care | Payer: Medicare Other | Source: Ambulatory Visit | Attending: Family Medicine | Admitting: Family Medicine

## 2016-06-07 DIAGNOSIS — E2839 Other primary ovarian failure: Secondary | ICD-10-CM | POA: Diagnosis not present

## 2016-06-07 DIAGNOSIS — M810 Age-related osteoporosis without current pathological fracture: Secondary | ICD-10-CM

## 2016-06-09 ENCOUNTER — Other Ambulatory Visit (INDEPENDENT_AMBULATORY_CARE_PROVIDER_SITE_OTHER): Payer: Medicare Other

## 2016-06-09 DIAGNOSIS — E875 Hyperkalemia: Secondary | ICD-10-CM

## 2016-06-09 DIAGNOSIS — M810 Age-related osteoporosis without current pathological fracture: Secondary | ICD-10-CM

## 2016-06-09 LAB — COMPREHENSIVE METABOLIC PANEL
ALBUMIN: 4.1 g/dL (ref 3.5–5.2)
ALT: 20 U/L (ref 0–35)
AST: 22 U/L (ref 0–37)
Alkaline Phosphatase: 56 U/L (ref 39–117)
BUN: 20 mg/dL (ref 6–23)
CALCIUM: 9.9 mg/dL (ref 8.4–10.5)
CHLORIDE: 103 meq/L (ref 96–112)
CO2: 29 mEq/L (ref 19–32)
CREATININE: 1.2 mg/dL (ref 0.40–1.20)
GFR: 54.37 mL/min — AB (ref >60.00)
Glucose, Bld: 104 mg/dL — ABNORMAL HIGH (ref 70–99)
Potassium: 4.2 mEq/L (ref 3.5–5.1)
Sodium: 138 mEq/L (ref 135–145)
Total Bilirubin: 0.5 mg/dL (ref 0.2–1.2)
Total Protein: 7.3 g/dL (ref 6.0–8.3)

## 2016-06-09 LAB — VITAMIN D 25 HYDROXY (VIT D DEFICIENCY, FRACTURES): VITD: 27.34 ng/mL — AB (ref 30.00–100.00)

## 2016-08-23 ENCOUNTER — Other Ambulatory Visit: Payer: Self-pay | Admitting: Family Medicine

## 2016-08-23 DIAGNOSIS — N3281 Overactive bladder: Secondary | ICD-10-CM

## 2016-08-23 DIAGNOSIS — I10 Essential (primary) hypertension: Secondary | ICD-10-CM

## 2016-08-23 DIAGNOSIS — E785 Hyperlipidemia, unspecified: Secondary | ICD-10-CM

## 2016-08-23 DIAGNOSIS — N289 Disorder of kidney and ureter, unspecified: Secondary | ICD-10-CM

## 2016-08-23 DIAGNOSIS — D538 Other specified nutritional anemias: Secondary | ICD-10-CM

## 2016-08-26 ENCOUNTER — Ambulatory Visit: Payer: Medicare Other | Admitting: *Deleted

## 2016-08-31 ENCOUNTER — Other Ambulatory Visit: Payer: Self-pay | Admitting: Cardiology

## 2016-08-31 DIAGNOSIS — I10 Essential (primary) hypertension: Secondary | ICD-10-CM

## 2016-09-22 ENCOUNTER — Other Ambulatory Visit: Payer: Self-pay | Admitting: Family Medicine

## 2016-10-07 ENCOUNTER — Other Ambulatory Visit: Payer: Self-pay | Admitting: Family Medicine

## 2016-10-11 IMAGING — DX DG CERVICAL SPINE COMPLETE 4+V
7 series · 7 of 7 positions shown · non-contrast
Comparison: None.

CLINICAL DATA: Left lateral neck pain.

EXAM:
CERVICAL SPINE  4+ VIEWS

[c-spine lat]
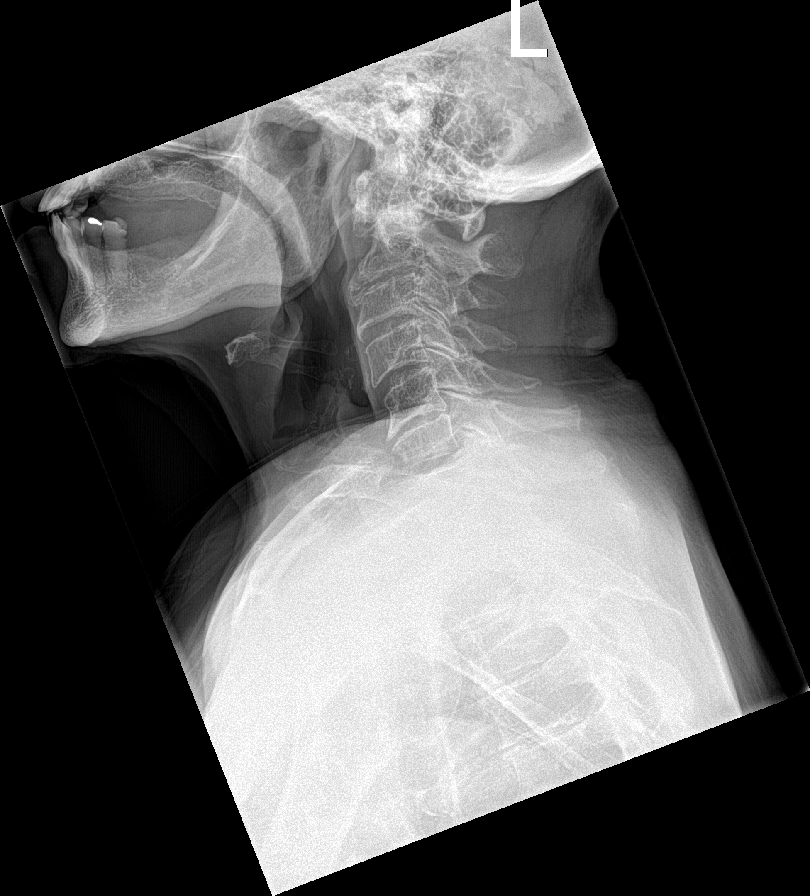

[c-spine obl (1 of 2)]
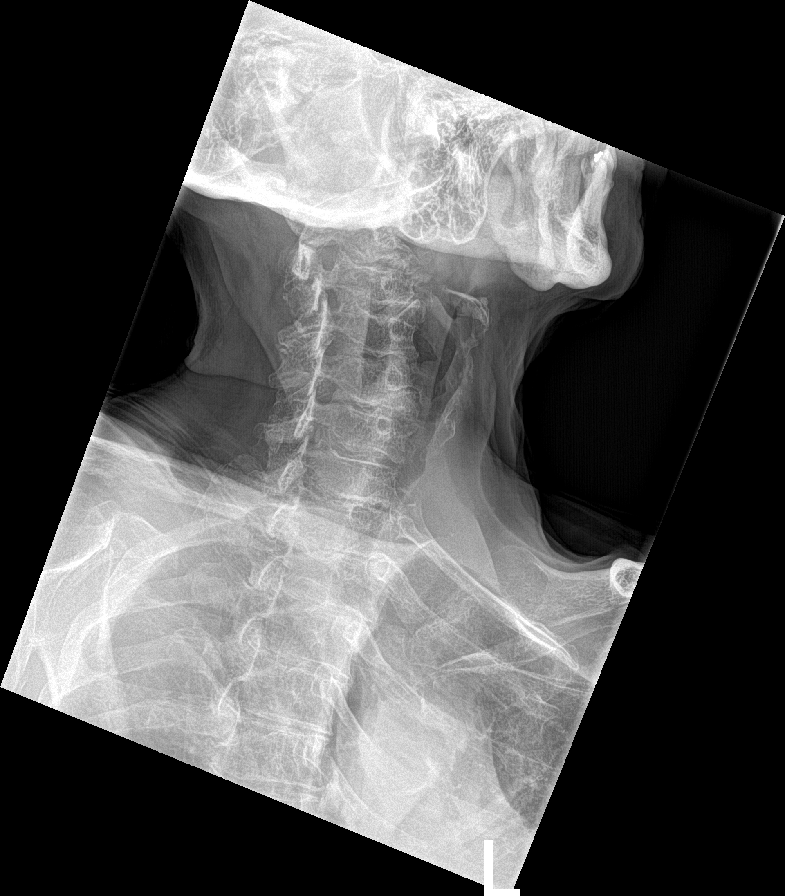

[c-spine obl (2 of 2)]
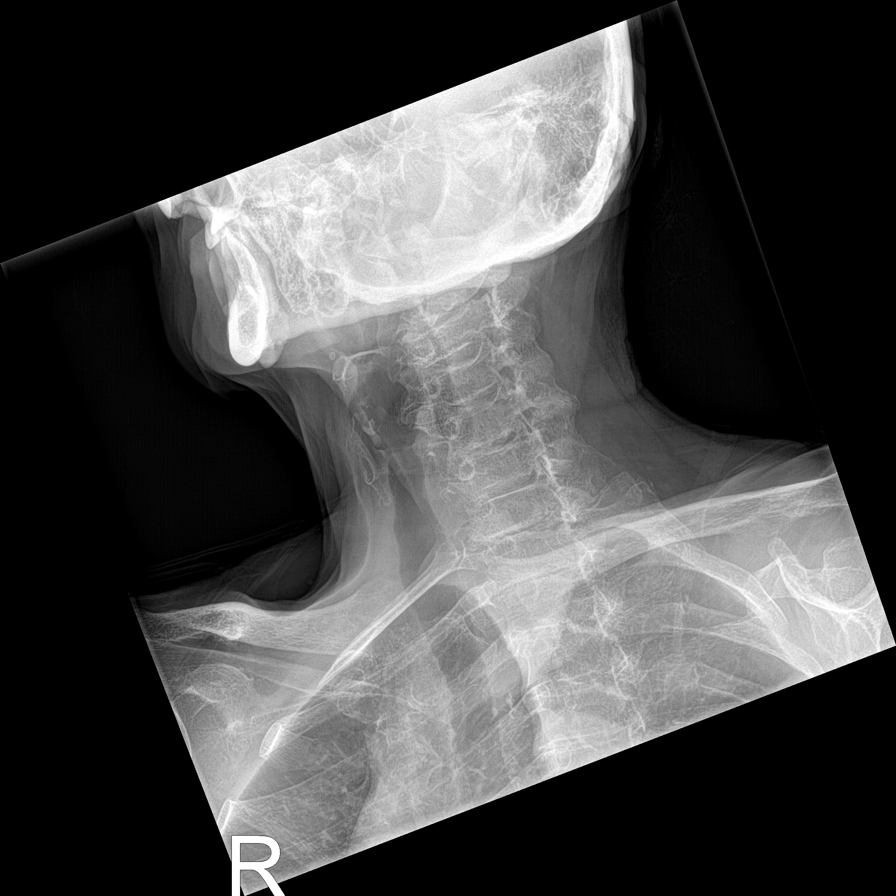

[c-spine ap]
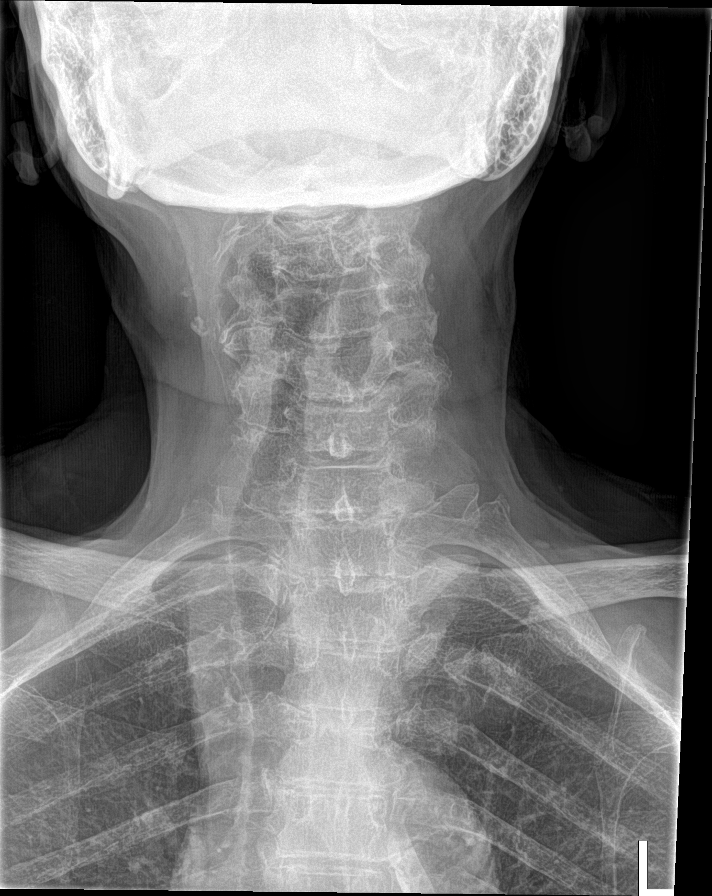

[c-spine open mouth]
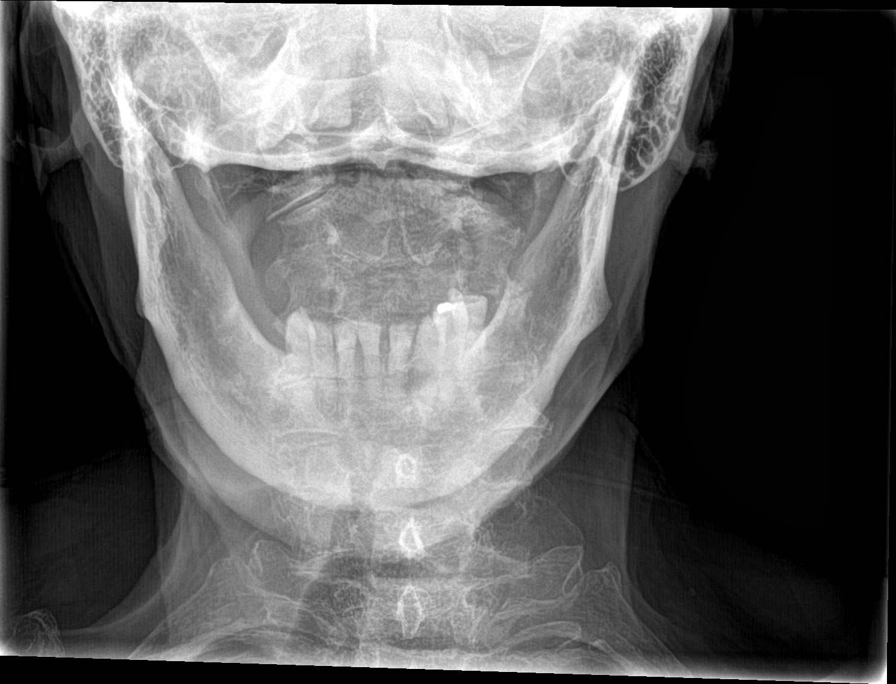

[c-spine swimmers]
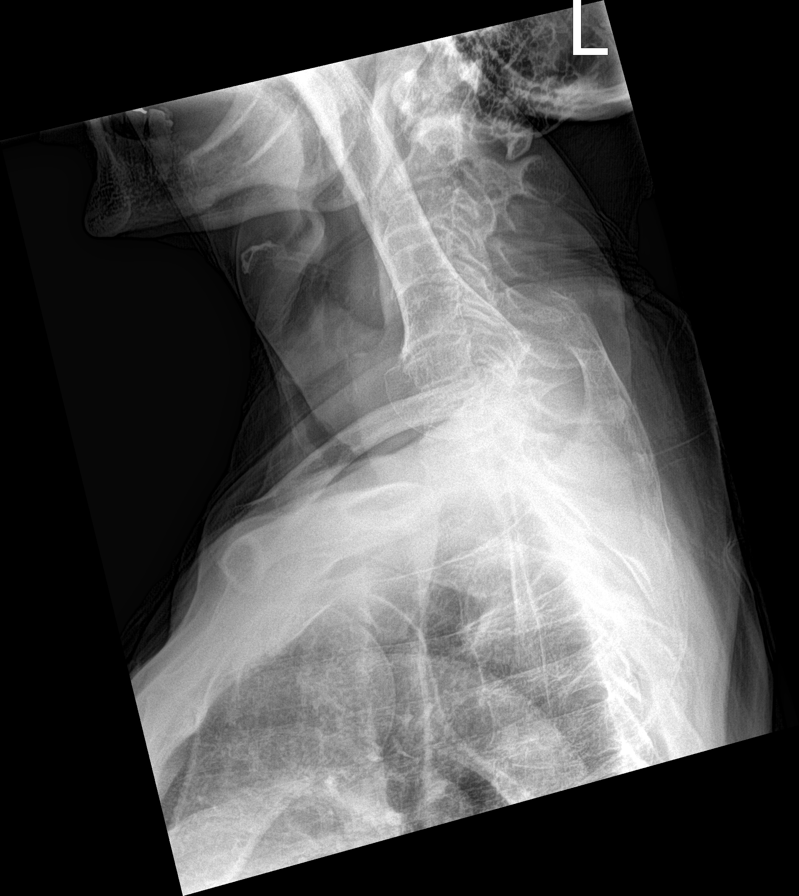

[[person_name]]
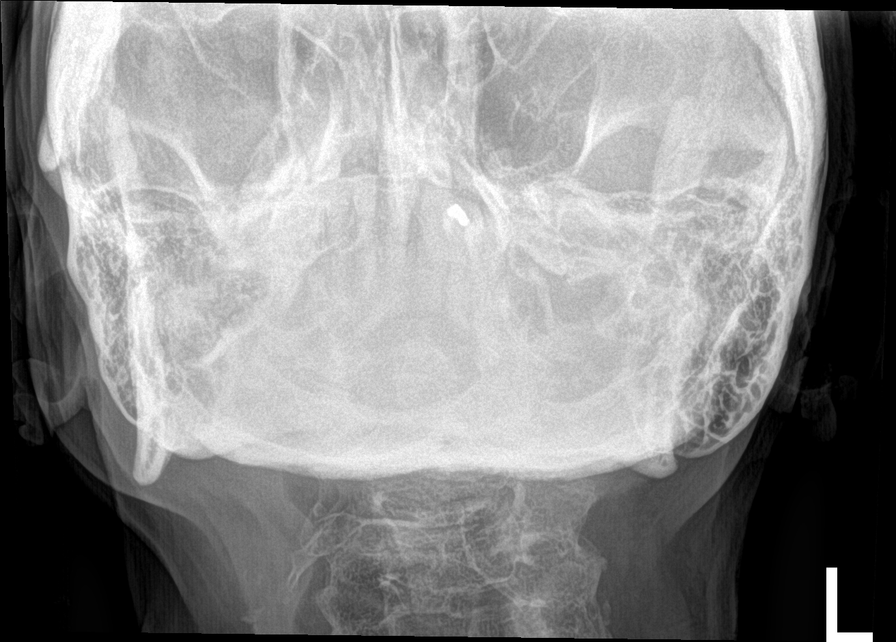

[7 of 7 positions shown; findings below may reference images not displayed]

FINDINGS: There is no evidence of cervical spine fracture or prevertebral soft
tissue swelling. There is stranding of the physiologic cervical
lordosis. There are multilevel osteoarthritic changes with disc
space narrowing, remodeling of vertebral bodies and large osteophyte
formation. Arthropathy of Luschka facets is also noted. There is
particularly prominent, approximately 30%, height loss of C6
vertebral body, without evidence of acute fracture line. There is
narrowing of the bony neural foramina at C6-C7 and C7-T1
bilaterally.
IMPRESSION: Multilevel osteoarthritic changes, with extensive vertebral body
remodeling and osteophyte formation.

Particularly prominent height loss of C6 vertebral body, which may
be degenerative, however subacute fracture cannot be excluded.

Narrowing of bony neural foramina at C6-C7 and C7-T1 bilaterally.

## 2016-11-29 DIAGNOSIS — H401132 Primary open-angle glaucoma, bilateral, moderate stage: Secondary | ICD-10-CM | POA: Diagnosis not present

## 2016-12-01 NOTE — Progress Notes (Deleted)
Subjective:   Holly Nunez is a 83 y.o. female who presents for Medicare Annual (Subsequent) preventive examination.  Review of Systems:  No ROS.  Medicare Wellness Visit. Additional risk factors are reflected in the social history.    Sleep patterns:  Home Safety/Smoke Alarms: Feels safe in home. Smoke alarms in place.  Living environment; residence and Firearm Safety:  [place] Safety/Bike Helmet: Wears seat belt.   Counseling:   Eye Exam-  Dental-  Female:   Pap- Hysterectomy       Mammo-       Dexa scan-   Last 06/07/16: osteoporosis  CCS- No longer doing routine screening due to age.     Objective:     Vitals: There were no vitals taken for this visit.  There is no height or weight on file to calculate BMI.   Tobacco History  Smoking Status  . Never Smoker  Smokeless Tobacco  . Never Used     Counseling given: Not Answered   Past Medical History:  Diagnosis Date  . Arthritis   . Estrogen deficiency 05/27/2016  . H/O measles   . H/O mumps   . History of chicken pox   . Hyperglycemia 05/27/2016  . Hyperlipidemia   . Hypertension   . Patent foramen ovale   . Preventative health care 02/26/2015  . Renal insufficiency   . Thyroid nodule    Biopsy-negative  . TIA (transient ischemic attack)   . Urinary incontinence    Past Surgical History:  Procedure Laterality Date  . ABDOMINAL HYSTERECTOMY     fibroid  . APPENDECTOMY    . EYE SURGERY Bilateral    CATARACT REMOVAL  . JOINT REPLACEMENT    . REPLACEMENT TOTAL KNEE Right 2007  . THYROID LOBECTOMY Left 04/25/2015  . THYROID LOBECTOMY Left 04/25/2015   Procedure: LEFT THYROID LOBECTOMY;  Surgeon: Armandina Gemma, MD;  Location: [facility];  Service: General;  Laterality: Left;  . TRIGGER FINGER RELEASE  2007   Family History  Problem Relation Age of Onset  . Heart disease Mother   . Hypertension Mother   . Kidney disease Sister   . Mental illness Sister        suicide, depresion  . Gout Brother     . Hypertension Son   . Cancer Maternal Grandmother   . Cancer Sister   . Bone cancer Sister   . Cancer Sister   . Cancer Sister        kidney  . Cancer Daughter   . Pancreatic cancer Daughter   . Thyroid disease Daughter   . Thyroid disease Daughter        thyroid nodules  . Hypertension Daughter   . Irritable bowel syndrome Daughter   . Cancer Daughter        thyroid  . Cancer Brother   . Prostate cancer Brother    History  Sexual Activity  . Sexual activity: Not Currently  . Partners: Male    Comment: no dietary restrictions, lives with 2 daughters    Outpatient Encounter Prescriptions as of 12/02/2016  Medication Sig  . acetaminophen (TYLENOL) 650 MG CR tablet Take 1,300 mg by mouth every 8 (eight) hours as needed for pain.  Marland Kitchen atorvastatin (LIPITOR) 40 MG tablet take 1 tablet by mouth once daily  . brimonidine (ALPHAGAN) 0.15 % ophthalmic solution Place 1 drop into both eyes 3 (three) times daily.  . carvedilol (COREG) 3.125 MG tablet take 2 tablets by mouth twice a  day  . dorzolamide (TRUSOPT) 2 % ophthalmic solution Place 1 drop into both eyes as directed.   . fluticasone (FLONASE) 50 MCG/ACT nasal spray Place 2 sprays into both nostrils daily.  Marland Kitchen latanoprost (XALATAN) 0.005 % ophthalmic solution Place 1 drop into both eyes as directed.   . nitroGLYCERIN (NITROLINGUAL) 0.4 MG/SPRAY spray Place 1 spray under the tongue every 5 (five) minutes x 3 doses as needed for chest pain.  Marland Kitchen oxybutynin (DITROPAN-XL) 10 MG 24 hr tablet take 1 tablet by mouth at bedtime  . triamterene-hydrochlorothiazide (MAXZIDE-25) 37.5-25 MG tablet take 1 tablet by mouth once daily  . XARELTO 15 MG TABS tablet take 1 tablet by mouth once daily with SUPPER   No facility-administered encounter medications on file as of 12/02/2016.     Activities of Daily Living No flowsheet data found.  Patient Care Team: Mosie Lukes, MD as PCP - General (Family Medicine)    Assessment:    Physical  assessment deferred to PCP.  Exercise Activities and Dietary recommendations   Diet (meal preparation, eat out, water intake, caffeinated beverages, dairy products, fruits and vegetables): {Desc; diets:16563} Breakfast: Lunch:  Dinner:      Goals    None     Fall Risk Fall Risk  11/25/2015 02/19/2014  Falls in the past year? No Yes  Number falls in past yr: - 2 or more  Risk for fall due to : - Impaired balance/gait   Depression Screen PHQ 2/9 Scores 11/25/2015 02/19/2014  PHQ - 2 Score 0 0     Cognitive Function        Immunization History  Administered Date(s) Administered  . Influenza,inj,Quad PF,36+ Mos 02/19/2014, 01/17/2015  . Pneumococcal Conjugate-13 11/25/2015  . Pneumococcal Polysaccharide-23 05/16/2013   Screening Tests Health Maintenance  Topic Date Due  . TETANUS/TDAP  02/27/1944  . INFLUENZA VACCINE  12/15/2016  . DEXA SCAN  Completed  . PNA vac Low Risk Adult  Completed      Plan:   ***   I have personally reviewed and noted the following in the patient's chart:   . Medical and social history . Use of alcohol, tobacco or illicit drugs  . Current medications and supplements . Functional ability and status . Nutritional status . Physical activity . Advanced directives . List of other physicians . Hospitalizations, surgeries, and ER visits in previous 12 months . Vitals . Screenings to include cognitive, depression, and falls . Referrals and appointments  In addition, I have reviewed and discussed with patient certain preventive protocols, quality metrics, and best practice recommendations. A written personalized care plan for preventive services as well as general preventive health recommendations were provided to patient.     Naaman Plummer [place], [place]  12/01/2016

## 2016-12-02 ENCOUNTER — Encounter: Payer: Self-pay | Admitting: Family Medicine

## 2016-12-02 ENCOUNTER — Ambulatory Visit (INDEPENDENT_AMBULATORY_CARE_PROVIDER_SITE_OTHER): Payer: Medicare Other | Admitting: Family Medicine

## 2016-12-02 VITALS — BP 140/89 | HR 58 | Temp 98.2°F | Resp 18 | Wt 145.4 lb

## 2016-12-02 DIAGNOSIS — R739 Hyperglycemia, unspecified: Secondary | ICD-10-CM

## 2016-12-02 DIAGNOSIS — I10 Essential (primary) hypertension: Secondary | ICD-10-CM | POA: Diagnosis not present

## 2016-12-02 DIAGNOSIS — N289 Disorder of kidney and ureter, unspecified: Secondary | ICD-10-CM | POA: Diagnosis not present

## 2016-12-02 DIAGNOSIS — E559 Vitamin D deficiency, unspecified: Secondary | ICD-10-CM | POA: Diagnosis not present

## 2016-12-02 DIAGNOSIS — M79675 Pain in left toe(s): Secondary | ICD-10-CM | POA: Diagnosis not present

## 2016-12-02 DIAGNOSIS — E7800 Pure hypercholesterolemia, unspecified: Secondary | ICD-10-CM

## 2016-12-02 HISTORY — DX: Pain in left toe(s): M79.675

## 2016-12-02 LAB — CBC
HCT: 35.5 % — ABNORMAL LOW (ref 36.0–46.0)
Hemoglobin: 12 g/dL (ref 12.0–15.0)
MCHC: 33.7 g/dL (ref 30.0–36.0)
MCV: 98.7 fl (ref 78.0–100.0)
PLATELETS: 178 10*3/uL (ref 150.0–400.0)
RBC: 3.6 Mil/uL — ABNORMAL LOW (ref 3.87–5.11)
RDW: 13.1 % (ref 11.5–15.5)
WBC: 6.2 10*3/uL (ref 4.0–10.5)

## 2016-12-02 LAB — COMPREHENSIVE METABOLIC PANEL
ALT: 15 U/L (ref 0–35)
AST: 19 U/L (ref 0–37)
Albumin: 4 g/dL (ref 3.5–5.2)
Alkaline Phosphatase: 48 U/L (ref 39–117)
BUN: 31 mg/dL — ABNORMAL HIGH (ref 6–23)
CALCIUM: 9.8 mg/dL (ref 8.4–10.5)
CHLORIDE: 101 meq/L (ref 96–112)
CO2: 26 meq/L (ref 19–32)
Creatinine, Ser: 1.27 mg/dL — ABNORMAL HIGH (ref 0.40–1.20)
GFR: 50.87 mL/min — AB (ref >60.00)
GLUCOSE: 92 mg/dL (ref 70–99)
Potassium: 4.4 mEq/L (ref 3.5–5.1)
Sodium: 136 mEq/L (ref 135–145)
Total Bilirubin: 0.5 mg/dL (ref 0.2–1.2)
Total Protein: 6.8 g/dL (ref 6.0–8.3)

## 2016-12-02 LAB — LIPID PANEL
CHOL/HDL RATIO: 2
Cholesterol: 136 mg/dL (ref 0–200)
HDL: 73.3 mg/dL (ref >39.00)
LDL CALC: 42 mg/dL (ref 0–99)
NONHDL: 62.47
TRIGLYCERIDES: 101 mg/dL (ref 0.0–149.0)
VLDL: 20.2 mg/dL (ref 0.0–40.0)

## 2016-12-02 LAB — VITAMIN D 25 HYDROXY (VIT D DEFICIENCY, FRACTURES): VITD: 60.32 ng/mL (ref 30.00–100.00)

## 2016-12-02 LAB — TSH: TSH: 1.92 u[IU]/mL (ref 0.35–4.50)

## 2016-12-02 LAB — HEMOGLOBIN A1C: HEMOGLOBIN A1C: 5.6 % (ref 4.6–6.5)

## 2016-12-02 NOTE — Assessment & Plan Note (Signed)
Check level and continue supplements 

## 2016-12-02 NOTE — Assessment & Plan Note (Signed)
Well controlled, no changes to meds. Encouraged heart healthy diet such as the DASH diet and exercise as tolerated.  °

## 2016-12-02 NOTE — Assessment & Plan Note (Signed)
Referred to podiatry for further consideration

## 2016-12-02 NOTE — Progress Notes (Signed)
Subjective:  I acted as a Education administrator for Dr. Charlett Blake. [place], [place]  Patient ID: Holly Nunez, female    DOB: [birth year], 83 y.o.   MRN: [id number]  No chief complaint on file.   HPI  Patient is in today for a 6 month follow up. She is accompanied by her daughter and all in all is doing well. No recent febrile illness or hospitalizations. Is managing to stay active and perform ADLs. She is complaining of left great toe pain where the second toenail is hitting her toe. Denies CP/palp/SOB/HA/congestion/fevers/GI or GU c/o. Taking meds as prescribed  Patient Care Team: Mosie Lukes, MD as PCP - General (Family Medicine)   Past Medical History:  Diagnosis Date  . Arthritis   . Estrogen deficiency 05/27/2016  . Great toe pain, left 12/02/2016  . H/O measles   . H/O mumps   . History of chicken pox   . Hyperglycemia 05/27/2016  . Hyperlipidemia   . Hypertension   . Patent foramen ovale   . Preventative health care 02/26/2015  . Renal insufficiency   . Thyroid nodule    Biopsy-negative  . TIA (transient ischemic attack)   . Urinary incontinence     Past Surgical History:  Procedure Laterality Date  . ABDOMINAL HYSTERECTOMY     fibroid  . APPENDECTOMY    . EYE SURGERY Bilateral    CATARACT REMOVAL  . JOINT REPLACEMENT    . REPLACEMENT TOTAL KNEE Right 2007  . THYROID LOBECTOMY Left 04/25/2015  . THYROID LOBECTOMY Left 04/25/2015   Procedure: LEFT THYROID LOBECTOMY;  Surgeon: Armandina Gemma, MD;  Location: [facility];  Service: General;  Laterality: Left;  . TRIGGER FINGER RELEASE  2007    Family History  Problem Relation Age of Onset  . Heart disease Mother   . Hypertension Mother   . Kidney disease Sister   . Mental illness Sister        suicide, depresion  . Gout Brother   . Hypertension Son   . Cancer Maternal Grandmother   . Cancer Sister   . Bone cancer Sister   . Cancer Sister   . Cancer Sister        kidney  . Cancer Daughter   . Pancreatic cancer Daughter   .  Thyroid disease Daughter   . Thyroid disease Daughter        thyroid nodules  . Hypertension Daughter   . Irritable bowel syndrome Daughter   . Cancer Daughter        thyroid  . Cancer Brother   . Prostate cancer Brother     Social History   Social History  . Marital status: Widowed    Spouse name: N/A  . Number of children: 4  . Years of education: N/A   Occupational History  . Not on file.   Social History Main Topics  . Smoking status: Never Smoker  . Smokeless tobacco: Never Used  . Alcohol use No  . Drug use: No  . Sexual activity: Not Currently    Partners: Male     Comment: no dietary restrictions, lives with 2 daughters   Other Topics Concern  . Not on file   Social History Narrative   No dietary restrictions and lives with daughters.    Outpatient Medications Prior to Visit  Medication Sig Dispense Refill  . acetaminophen (TYLENOL) 650 MG CR tablet Take 1,300 mg by mouth every 8 (eight) hours as needed for pain.    Marland Kitchen  atorvastatin (LIPITOR) 40 MG tablet take 1 tablet by mouth once daily 30 tablet 6  . brimonidine (ALPHAGAN) 0.15 % ophthalmic solution Place 1 drop into both eyes 3 (three) times daily.    . carvedilol (COREG) 3.125 MG tablet take 2 tablets by mouth twice a day 120 tablet 2  . fluticasone (FLONASE) 50 MCG/ACT nasal spray Place 2 sprays into both nostrils daily. 16 g 6  . latanoprost (XALATAN) 0.005 % ophthalmic solution Place 1 drop into both eyes as directed.   1  . nitroGLYCERIN (NITROLINGUAL) 0.4 MG/SPRAY spray Place 1 spray under the tongue every 5 (five) minutes x 3 doses as needed for chest pain.    Marland Kitchen oxybutynin (DITROPAN-XL) 10 MG 24 hr tablet take 1 tablet by mouth at bedtime 30 tablet 5  . triamterene-hydrochlorothiazide (MAXZIDE-25) 37.5-25 MG tablet take 1 tablet by mouth once daily 30 tablet 6  . XARELTO 15 MG TABS tablet take 1 tablet by mouth once daily with SUPPER 90 tablet 1  . dorzolamide (TRUSOPT) 2 % ophthalmic solution Place  1 drop into both eyes as directed.   0   No facility-administered medications prior to visit.     No Known Allergies  Review of Systems  Constitutional: Negative for fever and malaise/fatigue.  HENT: Negative for congestion.   Eyes: Negative for blurred vision.  Respiratory: Negative for cough and shortness of breath.   Cardiovascular: Negative for chest pain, palpitations and leg swelling.  Gastrointestinal: Negative for vomiting.  Musculoskeletal: Positive for joint pain. Negative for back pain.  Skin: Negative for rash.  Neurological: Negative for loss of consciousness and headaches.       Objective:    Physical Exam  Constitutional: She is oriented to person, place, and time. She appears well-developed and well-nourished. No distress.  HENT:  Head: Normocephalic and atraumatic.  Eyes: Conjunctivae are normal.  Neck: Normal range of motion. No thyromegaly present.  Cardiovascular: Normal rate and regular rhythm.   Pulmonary/Chest: Effort normal and breath sounds normal. She has no wheezes.  Abdominal: Soft. Bowel sounds are normal. There is no tenderness.  Musculoskeletal: Normal range of motion. She exhibits no edema or deformity.  Left great toenail and 2nd toenail are thick and deformed. Callous on lateral great toenail  Neurological: She is alert and oriented to person, place, and time.  Skin: Skin is warm and dry. She is not diaphoretic.  Psychiatric: She has a normal mood and affect.    BP 140/89 (BP Location: Left Arm, Patient Position: Sitting, Cuff Size: Normal)   Pulse (!) 58   Temp 98.2 F (36.8 C) (Oral)   Resp 18   Wt 145 lb 6.4 oz (66 kg)   SpO2 98%   BMI 28.40 kg/m  Wt Readings from Last 3 Encounters:  12/02/16 145 lb 6.4 oz (66 kg)  05/27/16 138 lb 12.8 oz (63 kg)  03/10/16 135 lb 12.8 oz (61.6 kg)   BP Readings from Last 3 Encounters:  12/02/16 140/89  05/27/16 128/72  03/10/16 136/80     Immunization History  Administered Date(s)  Administered  . Influenza,inj,Quad PF,36+ Mos 02/19/2014, 01/17/2015  . Pneumococcal Conjugate-13 11/25/2015  . Pneumococcal Polysaccharide-23 05/16/2013    Health Maintenance  Topic Date Due  . TETANUS/TDAP  02/27/1944  . INFLUENZA VACCINE  12/15/2016  . DEXA SCAN  Completed  . PNA vac Low Risk Adult  Completed    Lab Results  Component Value Date   WBC 6.2 12/02/2016   HGB 12.0 12/02/2016  HCT 35.5 (L) 12/02/2016   PLT 178.0 12/02/2016   GLUCOSE 92 12/02/2016   CHOL 136 12/02/2016   TRIG 101.0 12/02/2016   HDL 73.30 12/02/2016   LDLCALC 42 12/02/2016   ALT 15 12/02/2016   AST 19 12/02/2016   NA 136 12/02/2016   K 4.4 12/02/2016   CL 101 12/02/2016   CREATININE 1.27 (H) 12/02/2016   BUN 31 (H) 12/02/2016   CO2 26 12/02/2016   TSH 1.92 12/02/2016   INR 1.13 04/25/2015   HGBA1C 5.6 12/02/2016    Lab Results  Component Value Date   TSH 1.92 12/02/2016   Lab Results  Component Value Date   WBC 6.2 12/02/2016   HGB 12.0 12/02/2016   HCT 35.5 (L) 12/02/2016   MCV 98.7 12/02/2016   PLT 178.0 12/02/2016   Lab Results  Component Value Date   NA 136 12/02/2016   K 4.4 12/02/2016   CO2 26 12/02/2016   GLUCOSE 92 12/02/2016   BUN 31 (H) 12/02/2016   CREATININE 1.27 (H) 12/02/2016   BILITOT 0.5 12/02/2016   ALKPHOS 48 12/02/2016   AST 19 12/02/2016   ALT 15 12/02/2016   PROT 6.8 12/02/2016   ALBUMIN 4.0 12/02/2016   CALCIUM 9.8 12/02/2016   ANIONGAP 9 04/18/2015   GFR 50.87 (L) 12/02/2016   Lab Results  Component Value Date   CHOL 136 12/02/2016   Lab Results  Component Value Date   HDL 73.30 12/02/2016   Lab Results  Component Value Date   LDLCALC 42 12/02/2016   Lab Results  Component Value Date   TRIG 101.0 12/02/2016   Lab Results  Component Value Date   CHOLHDL 2 12/02/2016   Lab Results  Component Value Date   HGBA1C 5.6 12/02/2016         Assessment & Plan:   Problem List Items Addressed This Visit    HTN (hypertension)     Well controlled, no changes to meds. Encouraged heart healthy diet such as the DASH diet and exercise as tolerated.       Relevant Orders   CBC (Completed)   Comprehensive metabolic panel (Completed)   TSH (Completed)   Hyperlipidemia    Tolerating statin, encouraged heart healthy diet, avoid trans fats, minimize simple carbs and saturated fats. Increase exercise as tolerated      Relevant Orders   Lipid panel (Completed)   Renal insufficiency    Check cmp      Hyperglycemia    hgba1c acceptable, minimize simple carbs. Increase exercise as tolerated.       Relevant Orders   Hemoglobin A1c (Completed)   Great toe pain, left    Referred to podiatry for further consideration      Relevant Orders   Ambulatory referral to Podiatry   Vitamin D deficiency - Primary    Check level and continue supplements      Relevant Orders   VITAMIN D 25 Hydroxy (Vit-D Deficiency, Fractures) (Completed)      I have discontinued Ms. Burby's dorzolamide. I am also having her maintain her nitroGLYCERIN, brimonidine, acetaminophen, fluticasone, latanoprost, oxybutynin, XARELTO, carvedilol, atorvastatin, and triamterene-hydrochlorothiazide.  No orders of the defined types were placed in this encounter.   CMA served as Education administrator during this visit. History, Physical and Plan performed by medical provider. Documentation and orders reviewed and attested to.  Penni Homans, MD

## 2016-12-02 NOTE — Assessment & Plan Note (Signed)
hgba1c acceptable, minimize simple carbs. Increase exercise as tolerated.  

## 2016-12-02 NOTE — Patient Instructions (Signed)

## 2016-12-02 NOTE — Assessment & Plan Note (Signed)
Tolerating statin, encouraged heart healthy diet, avoid trans fats, minimize simple carbs and saturated fats. Increase exercise as tolerated 

## 2016-12-02 NOTE — Assessment & Plan Note (Signed)
Check cmp 

## 2016-12-09 ENCOUNTER — Ambulatory Visit: Payer: Medicare Other | Admitting: Family Medicine

## 2016-12-21 ENCOUNTER — Ambulatory Visit (INDEPENDENT_AMBULATORY_CARE_PROVIDER_SITE_OTHER): Payer: Medicare Other | Admitting: Podiatry

## 2016-12-21 ENCOUNTER — Encounter: Payer: Self-pay | Admitting: Podiatry

## 2016-12-21 DIAGNOSIS — M79675 Pain in left toe(s): Secondary | ICD-10-CM | POA: Diagnosis not present

## 2016-12-21 DIAGNOSIS — M79674 Pain in right toe(s): Secondary | ICD-10-CM | POA: Diagnosis not present

## 2016-12-21 DIAGNOSIS — B351 Tinea unguium: Secondary | ICD-10-CM | POA: Diagnosis not present

## 2016-12-21 NOTE — Progress Notes (Signed)
   Subjective:    Patient ID: Holly Nunez, female    DOB: 01-03-1925, 83 y.o.   MRN: [id number]  HPI   I have some thick and discolored nails and I am not a diabetic and my feet get numb and get cold at times and I am on blood thinners    Review of Systems  All other systems reviewed and are negative.      Objective:   Physical Exam        Assessment & Plan:

## 2016-12-21 NOTE — Patient Instructions (Addendum)
Can use fungi-nail on your toenails.

## 2016-12-21 NOTE — Progress Notes (Addendum)
Subjective:    Patient ID: Holly Nunez, female   DOB: 83 y.o.   MRN: [id number]   HPI Ms. Judice presents the office today for concerns of thick, elongated nails that she cannot trim herself. She states that she also gets ingrown tenderness to the big toe and her left second toenail is the most thick and growing crooked. Nails are painful with pressure in shoes. Denies any redness or drainage or any slanted nail sites. She denies any open sores. She has no other concerns today.   Review of Systems  All other systems reviewed and are negative.       Objective:  Physical Exam General: AAO x3, NAD  Dermatological: Nails are hypertrophic, dystrophic, brittle, discolored, elongated 10. The left second digit toenails the most thickened and curving. There is ingrown toenails to both of her big toes on the medial and lateral nail borders. No surrounding redness or drainage. Tenderness nails 1-5 bilaterally. No open lesions or pre-ulcerative lesions are identified today.  Vascular: Dorsalis Pedis artery and Posterior Tibial artery pedal pulses are 1/4 bilateral with immedate capillary fill time. There is no pain with calf compression, swelling, warmth, erythema. No claudication symptoms.  Neruologic: Grossly intact via light touch bilateral. Protective threshold with Semmes Wienstein monofilament intact to all pedal sites bilateral.   Musculoskeletal: No gross boney pedal deformities bilateral. No pain, crepitus, or limitation noted with foot and ankle range of motion bilateral. Muscular strength 4/5 in all groups tested bilateral.      Assessment:     83 year old female with symptomatic onchomycosis    Plan:     -Treatment options discussed including all alternatives, risks, and complications -Etiology of symptoms were discussed -Nails debrided 10 without complications or bleeding. Her son was asking about fungi-nail. Discussed outcome and usage.  -Daily foot inspection -Follow-up  in 3 months or sooner if any problems arise. In the meantime, encouraged to call the office with any questions, concerns, change in symptoms.   Celesta Gentile, DPM

## 2016-12-23 ENCOUNTER — Other Ambulatory Visit: Payer: Self-pay | Admitting: *Deleted

## 2016-12-23 ENCOUNTER — Other Ambulatory Visit: Payer: Self-pay | Admitting: Family Medicine

## 2016-12-23 DIAGNOSIS — I10 Essential (primary) hypertension: Secondary | ICD-10-CM

## 2016-12-23 MED ORDER — RIVAROXABAN 15 MG PO TABS: ORAL_TABLET | ORAL | 0 refills | Status: DC

## 2016-12-23 MED ORDER — CARVEDILOL 3.125 MG PO TABS: 6.2500 mg | ORAL_TABLET | Freq: Two times a day (BID) | ORAL | 2 refills | Status: DC

## 2017-01-10 IMAGING — CR DG CHEST 2V
2 series · 2 of 2 positions shown · non-contrast
Comparison: CT scan of the chest February 01, 2015

CLINICAL DATA: Preoperative examination prior left thyroid
lobectomy. History of patent foramen ovale and previous TIA

EXAM:
CHEST  2 VIEW

[w chest pa]
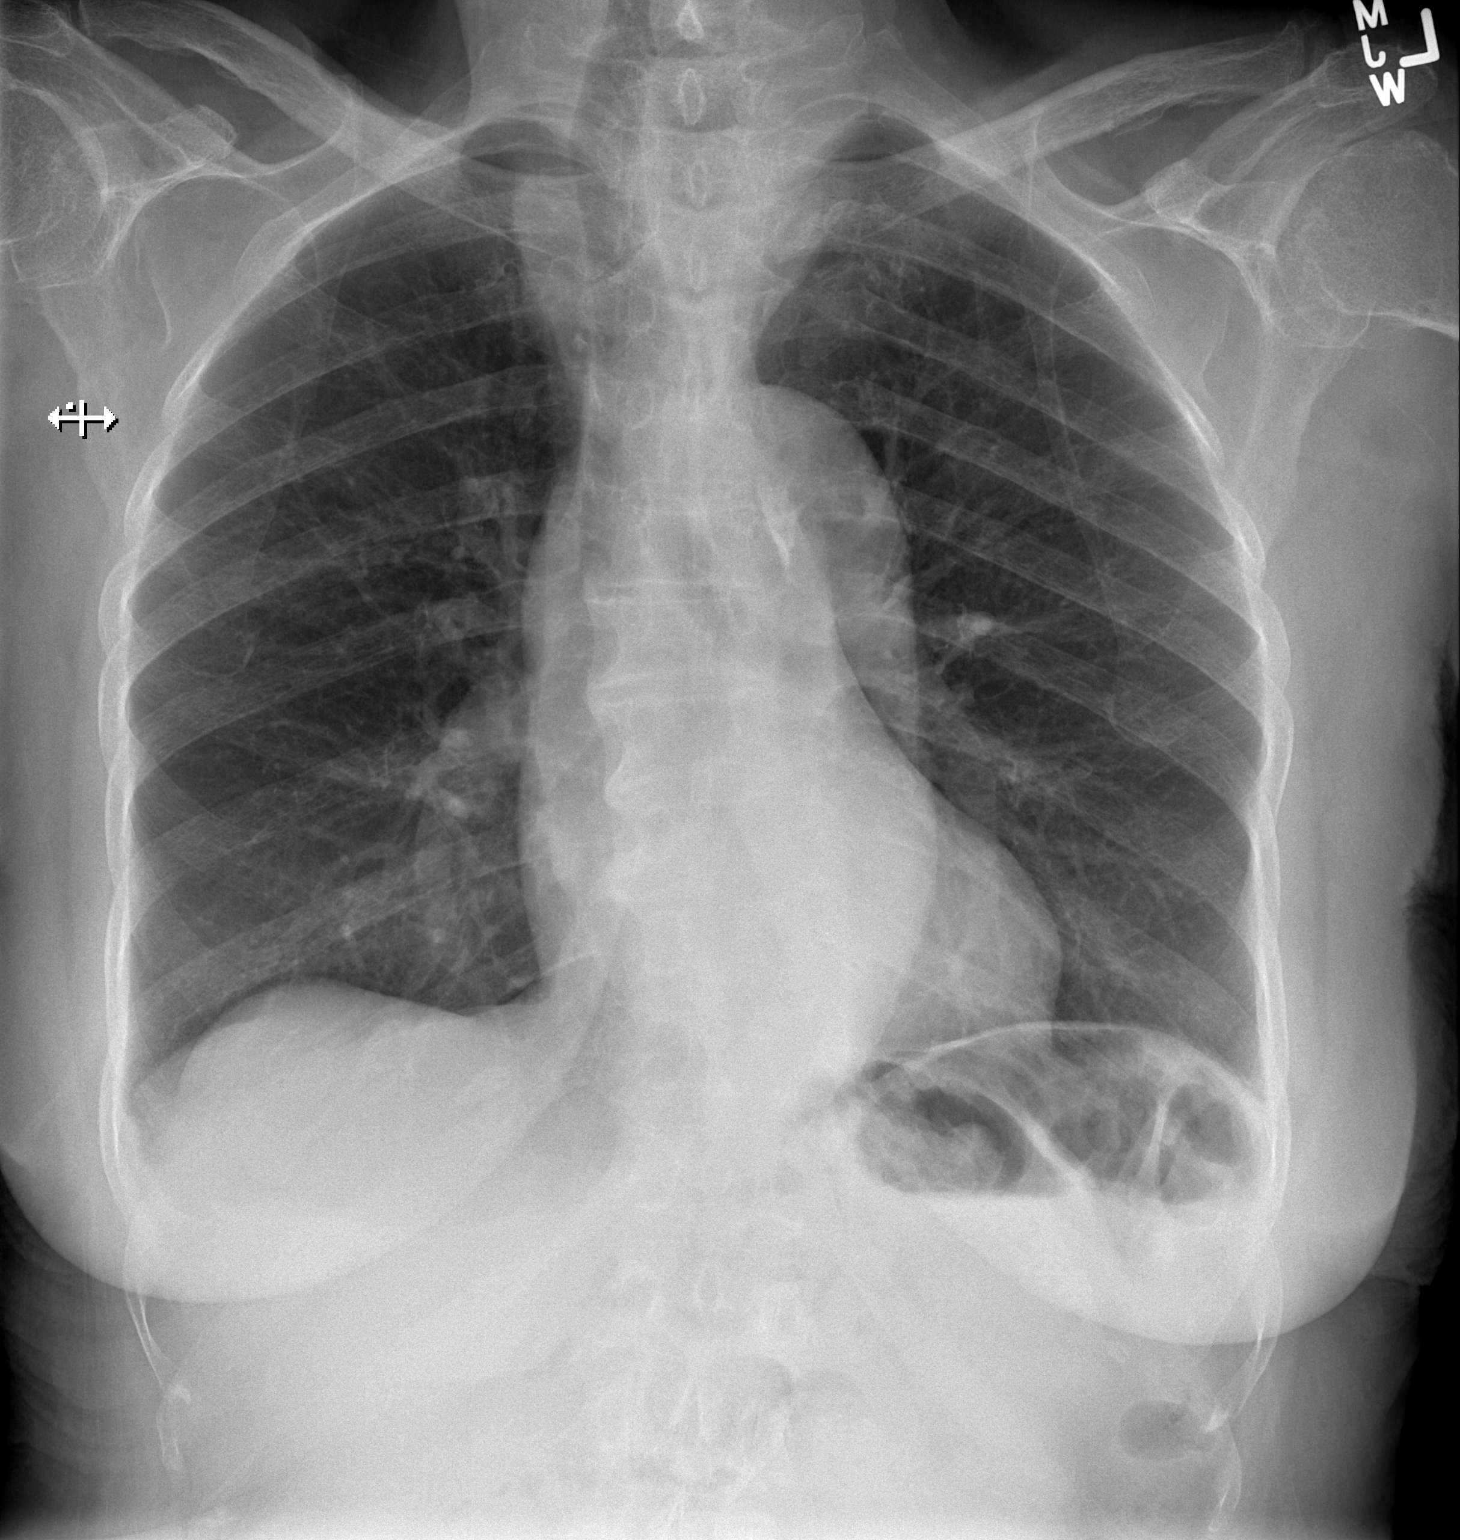

[w chest lat]
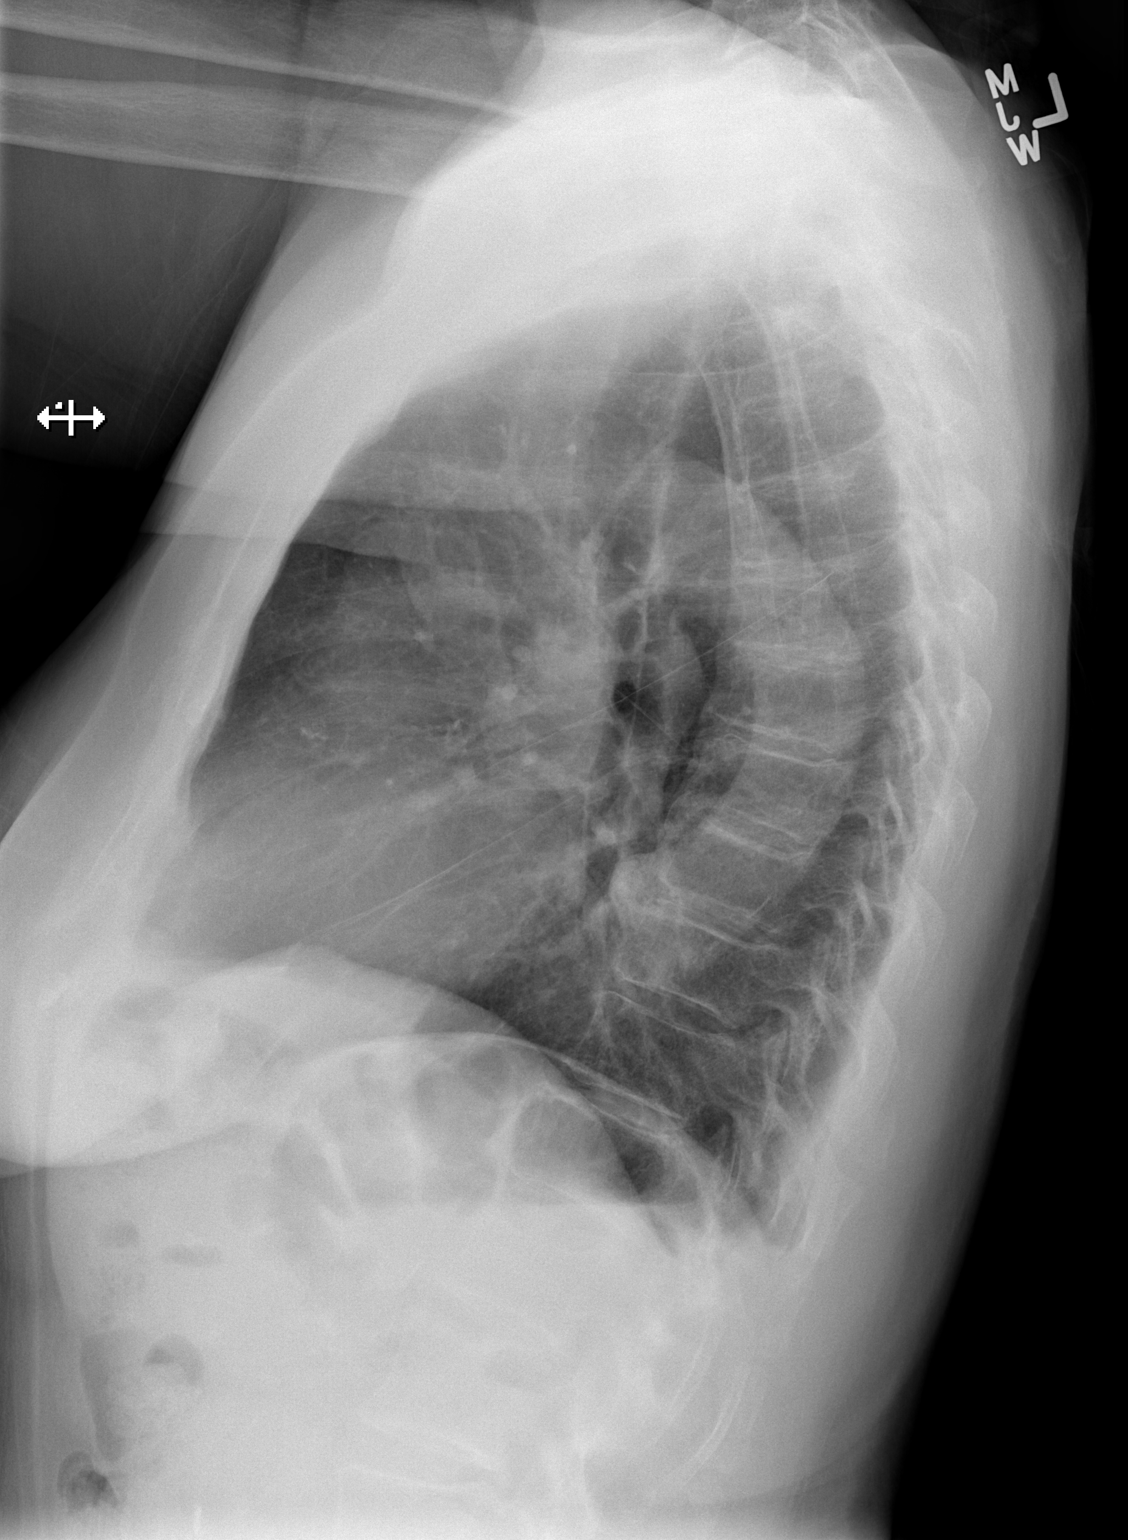

[2 of 2 positions shown; findings below may reference images not displayed]

FINDINGS: The lungs are adequately inflated and clear. The heart and pulmonary
vascularity are normal. There is tortuosity of the ascending and
descending thoracic aorta. There is deviation of the trachea toward
the right due to the known large left thyroid lobe. There is no
pleural effusion or pneumothorax. There is gentle dextro curvature
centered in the mid thoracic spine.
IMPRESSION: There is no active cardiopulmonary disease. There is deviation of
the trachea toward the right due to the large left thyroid lobe.

## 2017-02-17 DIAGNOSIS — Z23 Encounter for immunization: Secondary | ICD-10-CM | POA: Diagnosis not present

## 2017-03-01 DIAGNOSIS — H401132 Primary open-angle glaucoma, bilateral, moderate stage: Secondary | ICD-10-CM | POA: Diagnosis not present

## 2017-03-20 ENCOUNTER — Other Ambulatory Visit: Payer: Self-pay | Admitting: Family Medicine

## 2017-03-22 ENCOUNTER — Ambulatory Visit: Payer: Medicare Other | Admitting: Podiatry

## 2017-03-29 ENCOUNTER — Ambulatory Visit (INDEPENDENT_AMBULATORY_CARE_PROVIDER_SITE_OTHER): Payer: Medicare Other | Admitting: Podiatry

## 2017-03-29 ENCOUNTER — Encounter: Payer: Self-pay | Admitting: Podiatry

## 2017-03-29 DIAGNOSIS — M79675 Pain in left toe(s): Secondary | ICD-10-CM | POA: Diagnosis not present

## 2017-03-29 DIAGNOSIS — B351 Tinea unguium: Secondary | ICD-10-CM

## 2017-03-29 DIAGNOSIS — M79674 Pain in right toe(s): Secondary | ICD-10-CM

## 2017-03-29 NOTE — Progress Notes (Signed)
Subjective: 83 y.o. returns the office today for painful, elongated, thickened toenails which she cannot trim herself. Denies any redness or drainage around the nails. Denies any acute changes since last appointment and no new complaints today. Denies any systemic complaints such as fevers, chills, nausea, vomiting.   PCP: Mosie Lukes, MD  Objective: AAO 3, NAD DP/PT pulses palpable, CRT less than 3 seconds Nails hypertrophic, dystrophic, elongated, brittle, discolored 10. There is tenderness overlying the nails 1-5 bilaterally. There is no surrounding erythema or drainage along the nail sites. No open lesions or pre-ulcerative lesions are identified. No other areas of tenderness bilateral lower extremities. No overlying edema, erythema, increased warmth. No pain with calf compression, swelling, warmth, erythema.  Assessment: Patient presents with symptomatic onychomycosis  Plan: -Treatment options including alternatives, risks, complications were discussed -Nails sharply debrided 10 without complication/bleeding. -Discussed daily foot inspection. If there are any changes, to call the office immediately.  -Follow-up in 3 months or sooner if any problems are to arise. In the meantime, encouraged to call the office with any questions, concerns, changes symptoms.  Celesta Gentile, DPM

## 2017-03-30 ENCOUNTER — Other Ambulatory Visit: Payer: Self-pay

## 2017-03-30 DIAGNOSIS — N3281 Overactive bladder: Secondary | ICD-10-CM

## 2017-03-30 DIAGNOSIS — N289 Disorder of kidney and ureter, unspecified: Secondary | ICD-10-CM

## 2017-03-30 DIAGNOSIS — I10 Essential (primary) hypertension: Secondary | ICD-10-CM

## 2017-03-30 DIAGNOSIS — D538 Other specified nutritional anemias: Secondary | ICD-10-CM

## 2017-03-30 DIAGNOSIS — E785 Hyperlipidemia, unspecified: Secondary | ICD-10-CM

## 2017-04-04 ENCOUNTER — Other Ambulatory Visit: Payer: Self-pay

## 2017-04-04 DIAGNOSIS — D538 Other specified nutritional anemias: Secondary | ICD-10-CM

## 2017-04-04 DIAGNOSIS — N289 Disorder of kidney and ureter, unspecified: Secondary | ICD-10-CM

## 2017-04-04 DIAGNOSIS — E785 Hyperlipidemia, unspecified: Secondary | ICD-10-CM

## 2017-04-04 DIAGNOSIS — N3281 Overactive bladder: Secondary | ICD-10-CM

## 2017-04-04 DIAGNOSIS — I10 Essential (primary) hypertension: Secondary | ICD-10-CM

## 2017-04-04 MED ORDER — OXYBUTYNIN CHLORIDE ER 10 MG PO TB24: 10.0000 mg | ORAL_TABLET | Freq: Every day | ORAL | 2 refills | Status: DC

## 2017-04-19 ENCOUNTER — Other Ambulatory Visit: Payer: Self-pay | Admitting: Family Medicine

## 2017-05-04 ENCOUNTER — Other Ambulatory Visit: Payer: Self-pay

## 2017-05-04 MED ORDER — TRIAMTERENE-HCTZ 37.5-25 MG PO TABS: 1.0000 | ORAL_TABLET | Freq: Every day | ORAL | 1 refills | Status: DC

## 2017-05-04 MED ORDER — ATORVASTATIN CALCIUM 40 MG PO TABS: 40.0000 mg | ORAL_TABLET | Freq: Every day | ORAL | 1 refills | Status: DC

## 2017-05-16 ENCOUNTER — Other Ambulatory Visit: Payer: Self-pay | Admitting: Family Medicine

## 2017-05-28 ENCOUNTER — Other Ambulatory Visit: Payer: Self-pay | Admitting: Cardiology

## 2017-05-28 DIAGNOSIS — I10 Essential (primary) hypertension: Secondary | ICD-10-CM

## 2017-05-31 DIAGNOSIS — H16103 Unspecified superficial keratitis, bilateral: Secondary | ICD-10-CM | POA: Diagnosis not present

## 2017-05-31 DIAGNOSIS — H1045 Other chronic allergic conjunctivitis: Secondary | ICD-10-CM | POA: Diagnosis not present

## 2017-06-06 ENCOUNTER — Ambulatory Visit: Payer: Medicare Other | Admitting: Family Medicine

## 2017-06-08 DIAGNOSIS — H1045 Other chronic allergic conjunctivitis: Secondary | ICD-10-CM | POA: Diagnosis not present

## 2017-06-08 DIAGNOSIS — H16103 Unspecified superficial keratitis, bilateral: Secondary | ICD-10-CM | POA: Diagnosis not present

## 2017-06-14 ENCOUNTER — Ambulatory Visit: Payer: Medicare Other | Admitting: Family Medicine

## 2017-06-15 ENCOUNTER — Other Ambulatory Visit: Payer: Self-pay | Admitting: Medical

## 2017-06-21 ENCOUNTER — Encounter: Payer: Self-pay | Admitting: Family Medicine

## 2017-06-21 ENCOUNTER — Ambulatory Visit (INDEPENDENT_AMBULATORY_CARE_PROVIDER_SITE_OTHER): Payer: Medicare Other | Admitting: Family Medicine

## 2017-06-21 DIAGNOSIS — R739 Hyperglycemia, unspecified: Secondary | ICD-10-CM | POA: Diagnosis not present

## 2017-06-21 DIAGNOSIS — I10 Essential (primary) hypertension: Secondary | ICD-10-CM

## 2017-06-21 DIAGNOSIS — E7800 Pure hypercholesterolemia, unspecified: Secondary | ICD-10-CM

## 2017-06-21 DIAGNOSIS — R5381 Other malaise: Secondary | ICD-10-CM

## 2017-06-21 DIAGNOSIS — E559 Vitamin D deficiency, unspecified: Secondary | ICD-10-CM | POA: Diagnosis not present

## 2017-06-21 NOTE — Assessment & Plan Note (Signed)
Takes vitamin D 5000 IU daily check level

## 2017-06-21 NOTE — Assessment & Plan Note (Signed)
Encouraged stationary bike pedals and set up with home health

## 2017-06-21 NOTE — Progress Notes (Signed)
Subjective:  I acted as a Education administrator for Dr. Charlett Blake. [place], [place]  Patient ID: Holly Nunez, female    DOB: 23-Jul-1924, 84 y.o.   MRN: [id number]  No chief complaint on file.   HPI  Patient is in today for a 6 month follow up and overall is feeling well. No recent illness or acute concerns. Denies CP/palp/SOB/HA/congestion/fevers/GI or GU c/o. Taking meds as prescribed. No polyurua or polydipsia.   Patient Care Team: Mosie Lukes, MD as PCP - General (Family Medicine)   Past Medical History:  Diagnosis Date  . Arthritis   . Estrogen deficiency 05/27/2016  . Great toe pain, left 12/02/2016  . H/O measles   . H/O mumps   . History of chicken pox   . Hyperglycemia 05/27/2016  . Hyperlipidemia   . Hypertension   . Patent foramen ovale   . Preventative health care 02/26/2015  . Renal insufficiency   . Thyroid nodule    Biopsy-negative  . TIA (transient ischemic attack)   . Urinary incontinence     Past Surgical History:  Procedure Laterality Date  . ABDOMINAL HYSTERECTOMY     fibroid  . APPENDECTOMY    . EYE SURGERY Bilateral    CATARACT REMOVAL  . JOINT REPLACEMENT    . REPLACEMENT TOTAL KNEE Right 2007  . THYROID LOBECTOMY Left 04/25/2015  . THYROID LOBECTOMY Left 04/25/2015   Procedure: LEFT THYROID LOBECTOMY;  Surgeon: Armandina Gemma, MD;  Location: [facility];  Service: General;  Laterality: Left;  . TRIGGER FINGER RELEASE  2007    Family History  Problem Relation Age of Onset  . Heart disease Mother   . Hypertension Mother   . Kidney disease Sister   . Mental illness Sister        suicide, depresion  . Gout Brother   . Hypertension Son   . Cancer Maternal Grandmother   . Cancer Sister   . Bone cancer Sister   . Cancer Sister   . Cancer Sister        kidney  . Cancer Daughter   . Pancreatic cancer Daughter   . Thyroid disease Daughter   . Thyroid disease Daughter        thyroid nodules  . Hypertension Daughter   . Irritable bowel syndrome Daughter   .  Cancer Daughter        thyroid  . Cancer Brother   . Prostate cancer Brother     Social History   Socioeconomic History  . Marital status: Widowed    Spouse name: Not on file  . Number of children: 4  . Years of education: Not on file  . Highest education level: Not on file  Social Needs  . Financial resource strain: Not on file  . Food insecurity - worry: Not on file  . Food insecurity - inability: Not on file  . Transportation needs - medical: Not on file  . Transportation needs - non-medical: Not on file  Occupational History  . Not on file  Tobacco Use  . Smoking status: Never Smoker  . Smokeless tobacco: Never Used  Substance and Sexual Activity  . Alcohol use: No  . Drug use: No  . Sexual activity: Not Currently    Partners: Male    Comment: no dietary restrictions, lives with 2 daughters  Other Topics Concern  . Not on file  Social History Narrative   No dietary restrictions and lives with daughters.    Outpatient Medications Prior to Visit  Medication Sig Dispense Refill  . acetaminophen (TYLENOL) 650 MG CR tablet Take 1,300 mg by mouth every 8 (eight) hours as needed for pain.    Marland Kitchen atorvastatin (LIPITOR) 40 MG tablet Take 1 tablet (40 mg total) by mouth daily. 30 tablet 1  . azelastine (OPTIVAR) 0.05 % ophthalmic solution Place 0.005 drops into both eyes at bedtime.  2  . carvedilol (COREG) 3.125 MG tablet TAKE 2 TABLETS(6.25 MG) BY MOUTH TWICE DAILY 120 tablet 0  . dorzolamide (TRUSOPT) 2 % ophthalmic solution Place 2 drops into both eyes at bedtime.  6  . fluticasone (FLONASE) 50 MCG/ACT nasal spray Place 2 sprays into both nostrils daily. 16 g 6  . latanoprost (XALATAN) 0.005 % ophthalmic solution Place 1 drop into both eyes as directed.   1  . nitroGLYCERIN (NITROLINGUAL) 0.4 MG/SPRAY spray Place 1 spray under the tongue every 5 (five) minutes x 3 doses as needed for chest pain.    Marland Kitchen oxybutynin (DITROPAN-XL) 10 MG 24 hr tablet Take 1 tablet (10 mg total) at  bedtime by mouth. 30 tablet 2  . timolol (TIMOPTIC) 0.5 % ophthalmic solution Place 1 drop into both eyes at bedtime.  6  . triamterene-hydrochlorothiazide (MAXZIDE-25) 37.5-25 MG tablet Take 1 tablet by mouth daily. 30 tablet 1  . XARELTO 15 MG TABS tablet TAKE 1 TABLET BY MOUTH EVERY DAY WITH SUPPER. NEED FOLLOW UP APPOINTMENT WITH CARDIOLOGIST BEFORE NEXT REFILL AUTHORIZATION 30 tablet 0  . brimonidine (ALPHAGAN) 0.15 % ophthalmic solution Place 1 drop into both eyes 3 (three) times daily.    . prednisoLONE acetate (PRED FORTE) 1 % ophthalmic suspension Place 1 drop into both eyes at bedtime.  0   No facility-administered medications prior to visit.     Allergies  Allergen Reactions  . Alphagan [Brimonidine] Other (See Comments)    Secondary infection and vision changes    Review of Systems  Constitutional: Negative for fever and malaise/fatigue.  HENT: Negative for congestion.   Eyes: Negative for blurred vision.  Respiratory: Negative for shortness of breath.   Cardiovascular: Negative for chest pain, palpitations and leg swelling.  Gastrointestinal: Negative for abdominal pain, blood in stool and nausea.  Genitourinary: Negative for dysuria and frequency.  Musculoskeletal: Negative for falls.  Skin: Negative for rash.  Neurological: Negative for dizziness, loss of consciousness and headaches.  Endo/Heme/Allergies: Negative for environmental allergies.  Psychiatric/Behavioral: Negative for depression. The patient is not nervous/anxious.        Objective:    Physical Exam  Constitutional: She is oriented to person, place, and time. She appears well-developed and well-nourished. No distress.  HENT:  Head: Normocephalic and atraumatic.  Nose: Nose normal.  Eyes: Right eye exhibits no discharge. Left eye exhibits no discharge.  Neck: Normal range of motion. Neck supple.  Cardiovascular: Normal rate and regular rhythm.  No murmur heard. Pulmonary/Chest: Effort normal and  breath sounds normal.  Abdominal: Soft. Bowel sounds are normal. There is no tenderness.  Musculoskeletal: She exhibits no edema.  Neurological: She is alert and oriented to person, place, and time.  Skin: Skin is warm and dry.  Psychiatric: She has a normal mood and affect.  Nursing note and vitals reviewed.   BP 140/62 (BP Location: Left Arm, Patient Position: Sitting, Cuff Size: Normal)   Pulse 64   Temp 98.5 F (36.9 C) (Oral)   Resp 18   Wt 149 lb 12.8 oz (67.9 kg)   SpO2 97%   BMI 29.26 kg/m  Wt Readings  from Last 3 Encounters:  06/21/17 149 lb 12.8 oz (67.9 kg)  12/02/16 145 lb 6.4 oz (66 kg)  05/27/16 138 lb 12.8 oz (63 kg)   BP Readings from Last 3 Encounters:  06/21/17 140/62  12/02/16 140/89  05/27/16 128/72     Immunization History  Administered Date(s) Administered  . Influenza,inj,Quad PF,6+ Mos 02/19/2014, 01/17/2015  . Influenza-Unspecified 02/17/2017  . Pneumococcal Conjugate-13 11/25/2015  . Pneumococcal Polysaccharide-23 05/16/2013    Health Maintenance  Topic Date Due  . TETANUS/TDAP  02/27/1944  . INFLUENZA VACCINE  Completed  . DEXA SCAN  Completed  . PNA vac Low Risk Adult  Completed    Lab Results  Component Value Date   WBC 6.4 06/21/2017   HGB 13.5 06/21/2017   HCT 39.6 06/21/2017   PLT 196.0 06/21/2017   GLUCOSE 111 (H) 06/21/2017   CHOL 143 06/21/2017   TRIG 114.0 06/21/2017   HDL 70.40 06/21/2017   LDLCALC 50 06/21/2017   ALT 15 06/21/2017   AST 22 06/21/2017   NA 137 06/21/2017   K 4.7 06/21/2017   CL 99 06/21/2017   CREATININE 1.33 (H) 06/21/2017   BUN 28 (H) 06/21/2017   CO2 31 06/21/2017   TSH 1.48 06/21/2017   INR 1.13 04/25/2015   HGBA1C 5.9 06/21/2017    Lab Results  Component Value Date   TSH 1.48 06/21/2017   Lab Results  Component Value Date   WBC 6.4 06/21/2017   HGB 13.5 06/21/2017   HCT 39.6 06/21/2017   MCV 96.3 06/21/2017   PLT 196.0 06/21/2017   Lab Results  Component Value Date   NA 137  06/21/2017   K 4.7 06/21/2017   CO2 31 06/21/2017   GLUCOSE 111 (H) 06/21/2017   BUN 28 (H) 06/21/2017   CREATININE 1.33 (H) 06/21/2017   BILITOT 0.5 06/21/2017   ALKPHOS 66 06/21/2017   AST 22 06/21/2017   ALT 15 06/21/2017   PROT 7.6 06/21/2017   ALBUMIN 4.1 06/21/2017   CALCIUM 9.9 06/21/2017   ANIONGAP 9 04/18/2015   GFR 48.17 (L) 06/21/2017   Lab Results  Component Value Date   CHOL 143 06/21/2017   Lab Results  Component Value Date   HDL 70.40 06/21/2017   Lab Results  Component Value Date   LDLCALC 50 06/21/2017   Lab Results  Component Value Date   TRIG 114.0 06/21/2017   Lab Results  Component Value Date   CHOLHDL 2 06/21/2017   Lab Results  Component Value Date   HGBA1C 5.9 06/21/2017         Assessment & Plan:   Problem List Items Addressed This Visit    HTN (hypertension)    Well controlled, no changes to meds. Encouraged heart healthy diet such as the DASH diet and exercise as tolerated.       Relevant Orders   CBC (Completed)   Comprehensive metabolic panel (Completed)   TSH (Completed)   Hyperlipidemia    Encouraged heart healthy diet, increase exercise, avoid trans fats, consider a krill oil cap daily      Relevant Orders   Lipid panel (Completed)   Hyperglycemia    hgba1c acceptable, minimize simple carbs. Increase exercise as tolerated.       Relevant Orders   Hemoglobin A1c (Completed)   Vitamin D deficiency    Takes vitamin D 5000 IU daily check level      Relevant Orders   VITAMIN D 25 Hydroxy (Vit-D Deficiency, Fractures) (Completed)  Debility    Encouraged stationary bike pedals and set up with home health      Relevant Orders   Ambulatory referral to [facility] have discontinued Pamala Hurry Feggins's brimonidine and prednisoLONE acetate. I am also having her maintain her nitroGLYCERIN, acetaminophen, fluticasone, latanoprost, oxybutynin, atorvastatin, triamterene-hydrochlorothiazide, XARELTO, carvedilol,  azelastine, dorzolamide, and timolol.  No orders of the defined types were placed in this encounter.   CMA served as Education administrator during this visit. History, Physical and Plan performed by medical provider. Documentation and orders reviewed and attested to.  Penni Homans, MD

## 2017-06-21 NOTE — Assessment & Plan Note (Signed)
Well controlled, no changes to meds. Encouraged heart healthy diet such as the DASH diet and exercise as tolerated.  °

## 2017-06-21 NOTE — Assessment & Plan Note (Signed)
Increase leafy greens, consider increased lean red meat and using cast iron cookware. Continue to monitor, report any concerns 

## 2017-06-21 NOTE — Assessment & Plan Note (Signed)
Encouraged heart healthy diet, increase exercise, avoid trans fats, consider a krill oil cap daily 

## 2017-06-21 NOTE — Assessment & Plan Note (Signed)
hgba1c acceptable, minimize simple carbs. Increase exercise as tolerated.  

## 2017-06-21 NOTE — Patient Instructions (Signed)
Shingrix is the new shingles shot 2 shots over 2-6 months. At pharmacy Hypertension Hypertension is another name for high blood pressure. High blood pressure forces your heart to work harder to pump blood. This can cause problems over time. There are two numbers in a blood pressure reading. There is a top number (systolic) over a bottom number (diastolic). It is best to have a blood pressure below 120/80. Healthy choices can help lower your blood pressure. You may need medicine to help lower your blood pressure if:  Your blood pressure cannot be lowered with healthy choices.  Your blood pressure is higher than 130/80.  Follow these instructions at home: Eating and drinking  If directed, follow the DASH eating plan. This diet includes: ? Filling half of your plate at each meal with fruits and vegetables. ? Filling one quarter of your plate at each meal with whole grains. Whole grains include whole wheat pasta, brown rice, and whole grain bread. ? Eating or drinking low-fat dairy products, such as skim milk or low-fat yogurt. ? Filling one quarter of your plate at each meal with low-fat (lean) proteins. Low-fat proteins include fish, skinless chicken, eggs, beans, and tofu. ? Avoiding fatty meat, cured and processed meat, or chicken with skin. ? Avoiding premade or processed food.  Eat less than 1,500 mg of salt (sodium) a day.  Limit alcohol use to no more than 1 drink a day for nonpregnant women and 2 drinks a day for men. One drink equals 12 oz of beer, 5 oz of wine, or 1 oz of hard liquor. Lifestyle  Work with your doctor to stay at a healthy weight or to lose weight. Ask your doctor what the best weight is for you.  Get at least 30 minutes of exercise that causes your heart to beat faster (aerobic exercise) most days of the week. This may include walking, swimming, or biking.  Get at least 30 minutes of exercise that strengthens your muscles (resistance exercise) at least 3 days a  week. This may include lifting weights or pilates.  Do not use any products that contain nicotine or tobacco. This includes cigarettes and e-cigarettes. If you need help quitting, ask your doctor.  Check your blood pressure at home as told by your doctor.  Keep all follow-up visits as told by your doctor. This is important. Medicines  Take over-the-counter and prescription medicines only as told by your doctor. Follow directions carefully.  Do not skip doses of blood pressure medicine. The medicine does not work as well if you skip doses. Skipping doses also puts you at risk for problems.  Ask your doctor about side effects or reactions to medicines that you should watch for. Contact a doctor if:  You think you are having a reaction to the medicine you are taking.  You have headaches that keep coming back (recurring).  You feel dizzy.  You have swelling in your ankles.  You have trouble with your vision. Get help right away if:  You get a very bad headache.  You start to feel confused.  You feel weak or numb.  You feel faint.  You get very bad pain in your: ? Chest. ? Belly (abdomen).  You throw up (vomit) more than once.  You have trouble breathing. Summary  Hypertension is another name for high blood pressure.  Making healthy choices can help lower blood pressure. If your blood pressure cannot be controlled with healthy choices, you may need to take medicine. This information  This information is not intended to replace advice given to you by your health care provider. Make sure you discuss any questions you have with your health care provider. Document Released: 10/20/2007 Document Revised: 03/31/2016 Document Reviewed: 03/31/2016 Elsevier Interactive Patient Education  2018 Elsevier Inc[employer]  

## 2017-06-22 LAB — CBC
HEMATOCRIT: 39.6 % (ref 36.0–46.0)
HEMOGLOBIN: 13.5 g/dL (ref 12.0–15.0)
MCHC: 34.1 g/dL (ref 30.0–36.0)
MCV: 96.3 fl (ref 78.0–100.0)
Platelets: 196 10*3/uL (ref 150.0–400.0)
RBC: 4.11 Mil/uL (ref 3.87–5.11)
RDW: 13.1 % (ref 11.5–15.5)
WBC: 6.4 10*3/uL (ref 4.0–10.5)

## 2017-06-22 LAB — COMPREHENSIVE METABOLIC PANEL
ALK PHOS: 66 U/L (ref 39–117)
ALT: 15 U/L (ref 0–35)
AST: 22 U/L (ref 0–37)
Albumin: 4.1 g/dL (ref 3.5–5.2)
BUN: 28 mg/dL — ABNORMAL HIGH (ref 6–23)
CHLORIDE: 99 meq/L (ref 96–112)
CO2: 31 mEq/L (ref 19–32)
Calcium: 9.9 mg/dL (ref 8.4–10.5)
Creatinine, Ser: 1.33 mg/dL — ABNORMAL HIGH (ref 0.40–1.20)
GFR: 48.17 mL/min — AB (ref >60.00)
GLUCOSE: 111 mg/dL — AB (ref 70–99)
Potassium: 4.7 mEq/L (ref 3.5–5.1)
Sodium: 137 mEq/L (ref 135–145)
TOTAL PROTEIN: 7.6 g/dL (ref 6.0–8.3)
Total Bilirubin: 0.5 mg/dL (ref 0.2–1.2)

## 2017-06-22 LAB — HEMOGLOBIN A1C: Hgb A1c MFr Bld: 5.9 % (ref 4.6–6.5)

## 2017-06-22 LAB — LIPID PANEL
CHOLESTEROL: 143 mg/dL (ref 0–200)
HDL: 70.4 mg/dL (ref >39.00)
LDL Cholesterol: 50 mg/dL (ref 0–99)
NONHDL: 72.32
Total CHOL/HDL Ratio: 2
Triglycerides: 114 mg/dL (ref 0.0–149.0)
VLDL: 22.8 mg/dL (ref 0.0–40.0)

## 2017-06-22 LAB — TSH: TSH: 1.48 u[IU]/mL (ref 0.35–4.50)

## 2017-06-22 LAB — VITAMIN D 25 HYDROXY (VIT D DEFICIENCY, FRACTURES): VITD: 57.23 ng/mL (ref 30.00–100.00)

## 2017-06-23 DIAGNOSIS — H401132 Primary open-angle glaucoma, bilateral, moderate stage: Secondary | ICD-10-CM | POA: Diagnosis not present

## 2017-06-24 ENCOUNTER — Telehealth: Payer: Self-pay | Admitting: Family Medicine

## 2017-06-24 ENCOUNTER — Other Ambulatory Visit: Payer: Self-pay | Admitting: Pharmacist Clinician (PhC)/ Clinical Pharmacy Specialist

## 2017-06-24 DIAGNOSIS — I10 Essential (primary) hypertension: Secondary | ICD-10-CM

## 2017-06-24 MED ORDER — RIVAROXABAN 15 MG PO TABS: ORAL_TABLET | ORAL | 0 refills | Status: DC

## 2017-06-24 NOTE — Telephone Encounter (Signed)
Copied from [place]. Topic: Quick Communication - See Telephone Encounter >> Jun 24, 2017  3:58 PM Aurelio Brash B wrote: CRM for notification. See Telephone encounter for:  Angie from  [facility]  wants to know if it is ok to start home health on Monday.  Her contact number is [phone]  06/24/17.

## 2017-06-24 NOTE — Telephone Encounter (Signed)
Verbal orders given  

## 2017-06-25 NOTE — Telephone Encounter (Signed)
thanks

## 2017-06-28 DIAGNOSIS — N189 Chronic kidney disease, unspecified: Secondary | ICD-10-CM | POA: Diagnosis not present

## 2017-06-28 DIAGNOSIS — Z7901 Long term (current) use of anticoagulants: Secondary | ICD-10-CM | POA: Diagnosis not present

## 2017-06-28 DIAGNOSIS — M1990 Unspecified osteoarthritis, unspecified site: Secondary | ICD-10-CM | POA: Diagnosis not present

## 2017-06-28 DIAGNOSIS — I129 Hypertensive chronic kidney disease with stage 1 through stage 4 chronic kidney disease, or unspecified chronic kidney disease: Secondary | ICD-10-CM | POA: Diagnosis not present

## 2017-06-28 DIAGNOSIS — R5381 Other malaise: Secondary | ICD-10-CM | POA: Diagnosis not present

## 2017-06-28 DIAGNOSIS — Q211 Atrial septal defect: Secondary | ICD-10-CM | POA: Diagnosis not present

## 2017-06-28 DIAGNOSIS — Z8673 Personal history of transient ischemic attack (TIA), and cerebral infarction without residual deficits: Secondary | ICD-10-CM | POA: Diagnosis not present

## 2017-06-28 DIAGNOSIS — Z9181 History of falling: Secondary | ICD-10-CM | POA: Diagnosis not present

## 2017-06-28 DIAGNOSIS — Z96651 Presence of right artificial knee joint: Secondary | ICD-10-CM | POA: Diagnosis not present

## 2017-06-29 ENCOUNTER — Other Ambulatory Visit: Payer: Self-pay | Admitting: Family Medicine

## 2017-06-29 DIAGNOSIS — N289 Disorder of kidney and ureter, unspecified: Secondary | ICD-10-CM

## 2017-06-29 DIAGNOSIS — I10 Essential (primary) hypertension: Secondary | ICD-10-CM

## 2017-06-29 DIAGNOSIS — E785 Hyperlipidemia, unspecified: Secondary | ICD-10-CM

## 2017-06-29 DIAGNOSIS — N3281 Overactive bladder: Secondary | ICD-10-CM

## 2017-06-29 DIAGNOSIS — D538 Other specified nutritional anemias: Secondary | ICD-10-CM

## 2017-06-30 ENCOUNTER — Telehealth: Payer: Self-pay | Admitting: Family Medicine

## 2017-06-30 NOTE — Telephone Encounter (Signed)
Copied from [facility] #[id number]. Topic: Quick Communication - See Telephone Encounter >> Jun 30, 2017  9:11 AM Ivar Drape wrote: CRM for notification. See Telephone encounter for:  06/30/17. Holly Nunez w/[facility] [phone] needs verbal orders for physical therapy, two times a week for five weeks. If he can't be reached, it is ok to leave the verbal order on the voice mail.

## 2017-07-01 DIAGNOSIS — Z9181 History of falling: Secondary | ICD-10-CM | POA: Diagnosis not present

## 2017-07-01 DIAGNOSIS — Q211 Atrial septal defect: Secondary | ICD-10-CM | POA: Diagnosis not present

## 2017-07-01 DIAGNOSIS — N189 Chronic kidney disease, unspecified: Secondary | ICD-10-CM | POA: Diagnosis not present

## 2017-07-01 DIAGNOSIS — R5381 Other malaise: Secondary | ICD-10-CM | POA: Diagnosis not present

## 2017-07-01 DIAGNOSIS — M1990 Unspecified osteoarthritis, unspecified site: Secondary | ICD-10-CM | POA: Diagnosis not present

## 2017-07-01 DIAGNOSIS — I129 Hypertensive chronic kidney disease with stage 1 through stage 4 chronic kidney disease, or unspecified chronic kidney disease: Secondary | ICD-10-CM | POA: Diagnosis not present

## 2017-07-04 DIAGNOSIS — I129 Hypertensive chronic kidney disease with stage 1 through stage 4 chronic kidney disease, or unspecified chronic kidney disease: Secondary | ICD-10-CM | POA: Diagnosis not present

## 2017-07-04 DIAGNOSIS — M1990 Unspecified osteoarthritis, unspecified site: Secondary | ICD-10-CM | POA: Diagnosis not present

## 2017-07-04 DIAGNOSIS — R5381 Other malaise: Secondary | ICD-10-CM | POA: Diagnosis not present

## 2017-07-04 DIAGNOSIS — Z9181 History of falling: Secondary | ICD-10-CM | POA: Diagnosis not present

## 2017-07-04 DIAGNOSIS — Q211 Atrial septal defect: Secondary | ICD-10-CM | POA: Diagnosis not present

## 2017-07-04 DIAGNOSIS — N189 Chronic kidney disease, unspecified: Secondary | ICD-10-CM | POA: Diagnosis not present

## 2017-07-05 ENCOUNTER — Ambulatory Visit (INDEPENDENT_AMBULATORY_CARE_PROVIDER_SITE_OTHER): Payer: Medicare Other | Admitting: Podiatry

## 2017-07-05 DIAGNOSIS — M79675 Pain in left toe(s): Secondary | ICD-10-CM

## 2017-07-05 DIAGNOSIS — M79674 Pain in right toe(s): Secondary | ICD-10-CM | POA: Diagnosis not present

## 2017-07-05 DIAGNOSIS — B351 Tinea unguium: Secondary | ICD-10-CM

## 2017-07-05 NOTE — Telephone Encounter (Signed)
Verbal orders given  

## 2017-07-06 DIAGNOSIS — M1990 Unspecified osteoarthritis, unspecified site: Secondary | ICD-10-CM | POA: Diagnosis not present

## 2017-07-06 DIAGNOSIS — N189 Chronic kidney disease, unspecified: Secondary | ICD-10-CM | POA: Diagnosis not present

## 2017-07-06 DIAGNOSIS — R5381 Other malaise: Secondary | ICD-10-CM | POA: Diagnosis not present

## 2017-07-06 DIAGNOSIS — I129 Hypertensive chronic kidney disease with stage 1 through stage 4 chronic kidney disease, or unspecified chronic kidney disease: Secondary | ICD-10-CM | POA: Diagnosis not present

## 2017-07-06 DIAGNOSIS — Z9181 History of falling: Secondary | ICD-10-CM | POA: Diagnosis not present

## 2017-07-06 DIAGNOSIS — Q211 Atrial septal defect: Secondary | ICD-10-CM | POA: Diagnosis not present

## 2017-07-06 NOTE — Progress Notes (Signed)
Subjective: 84 y.o. returns the office today for painful, elongated, thickened toenails which she cannot trim herself. Denies any redness or drainage around the nails. Denies any acute changes since last appointment and no new complaints today. Denies any systemic complaints such as fevers, chills, nausea, vomiting.   PCP: Mosie Lukes, MD  She has no new concerns today.   Objective: NAD DP/PT pulses palpable, CRT less than 3 seconds Nails hypertrophic, dystrophic, elongated, brittle, discolored 10. There is tenderness overlying the nails 1-5 bilaterally. There is no surrounding erythema or drainage along the nail sites. No open lesions or pre-ulcerative lesions are identified. No other areas of tenderness bilateral lower extremities. No overlying edema, erythema, increased warmth. No pain with calf compression, swelling, warmth, erythema.  Assessment: Patient presents with symptomatic onychomycosis  Plan: -Treatment options including alternatives, risks, complications were discussed -Nails sharply debrided 10 without complication/bleeding. -Discussed daily foot inspection. If there are any changes, to call the office immediately.  -Follow-up in 3 months or sooner if any problems are to arise. In the meantime, encouraged to call the office with any questions, concerns, changes symptoms.  Celesta Gentile, DPM

## 2017-07-11 ENCOUNTER — Telehealth: Payer: Self-pay | Admitting: *Deleted

## 2017-07-11 DIAGNOSIS — I129 Hypertensive chronic kidney disease with stage 1 through stage 4 chronic kidney disease, or unspecified chronic kidney disease: Secondary | ICD-10-CM | POA: Diagnosis not present

## 2017-07-11 DIAGNOSIS — M1990 Unspecified osteoarthritis, unspecified site: Secondary | ICD-10-CM | POA: Diagnosis not present

## 2017-07-11 DIAGNOSIS — Z9181 History of falling: Secondary | ICD-10-CM | POA: Diagnosis not present

## 2017-07-11 DIAGNOSIS — N189 Chronic kidney disease, unspecified: Secondary | ICD-10-CM | POA: Diagnosis not present

## 2017-07-11 DIAGNOSIS — R5381 Other malaise: Secondary | ICD-10-CM | POA: Diagnosis not present

## 2017-07-11 DIAGNOSIS — Q211 Atrial septal defect: Secondary | ICD-10-CM | POA: Diagnosis not present

## 2017-07-11 NOTE — Telephone Encounter (Signed)
Received Home Health Certification and Plan of Care; forwarded to provider/SLS 02/25

## 2017-07-12 ENCOUNTER — Other Ambulatory Visit: Payer: Self-pay | Admitting: Family Medicine

## 2017-07-13 ENCOUNTER — Telehealth: Payer: Self-pay | Admitting: *Deleted

## 2017-07-13 DIAGNOSIS — I129 Hypertensive chronic kidney disease with stage 1 through stage 4 chronic kidney disease, or unspecified chronic kidney disease: Secondary | ICD-10-CM | POA: Diagnosis not present

## 2017-07-13 DIAGNOSIS — N189 Chronic kidney disease, unspecified: Secondary | ICD-10-CM | POA: Diagnosis not present

## 2017-07-13 DIAGNOSIS — M1990 Unspecified osteoarthritis, unspecified site: Secondary | ICD-10-CM | POA: Diagnosis not present

## 2017-07-13 DIAGNOSIS — R5381 Other malaise: Secondary | ICD-10-CM | POA: Diagnosis not present

## 2017-07-13 DIAGNOSIS — Q211 Atrial septal defect: Secondary | ICD-10-CM | POA: Diagnosis not present

## 2017-07-13 DIAGNOSIS — Z9181 History of falling: Secondary | ICD-10-CM | POA: Diagnosis not present

## 2017-07-13 NOTE — Telephone Encounter (Signed)
Received F2F documentation from [facility], attached OV note; forwarded to provider/SLS 02/27

## 2017-07-13 NOTE — Telephone Encounter (Signed)
Plan of Care signed and faxed to [facility] at [phone]. Form sent for scanning.

## 2017-07-18 DIAGNOSIS — Q211 Atrial septal defect: Secondary | ICD-10-CM | POA: Diagnosis not present

## 2017-07-18 DIAGNOSIS — R5381 Other malaise: Secondary | ICD-10-CM | POA: Diagnosis not present

## 2017-07-18 DIAGNOSIS — I129 Hypertensive chronic kidney disease with stage 1 through stage 4 chronic kidney disease, or unspecified chronic kidney disease: Secondary | ICD-10-CM | POA: Diagnosis not present

## 2017-07-18 DIAGNOSIS — N189 Chronic kidney disease, unspecified: Secondary | ICD-10-CM | POA: Diagnosis not present

## 2017-07-18 DIAGNOSIS — M1990 Unspecified osteoarthritis, unspecified site: Secondary | ICD-10-CM | POA: Diagnosis not present

## 2017-07-18 DIAGNOSIS — Z9181 History of falling: Secondary | ICD-10-CM | POA: Diagnosis not present

## 2017-07-20 DIAGNOSIS — R5381 Other malaise: Secondary | ICD-10-CM | POA: Diagnosis not present

## 2017-07-20 DIAGNOSIS — N189 Chronic kidney disease, unspecified: Secondary | ICD-10-CM | POA: Diagnosis not present

## 2017-07-20 DIAGNOSIS — I129 Hypertensive chronic kidney disease with stage 1 through stage 4 chronic kidney disease, or unspecified chronic kidney disease: Secondary | ICD-10-CM | POA: Diagnosis not present

## 2017-07-20 DIAGNOSIS — M1990 Unspecified osteoarthritis, unspecified site: Secondary | ICD-10-CM | POA: Diagnosis not present

## 2017-07-20 DIAGNOSIS — Q211 Atrial septal defect: Secondary | ICD-10-CM | POA: Diagnosis not present

## 2017-07-20 DIAGNOSIS — Z9181 History of falling: Secondary | ICD-10-CM | POA: Diagnosis not present

## 2017-07-25 ENCOUNTER — Other Ambulatory Visit: Payer: Self-pay | Admitting: Cardiology

## 2017-07-25 DIAGNOSIS — R5381 Other malaise: Secondary | ICD-10-CM | POA: Diagnosis not present

## 2017-07-25 DIAGNOSIS — N189 Chronic kidney disease, unspecified: Secondary | ICD-10-CM | POA: Diagnosis not present

## 2017-07-25 DIAGNOSIS — I129 Hypertensive chronic kidney disease with stage 1 through stage 4 chronic kidney disease, or unspecified chronic kidney disease: Secondary | ICD-10-CM | POA: Diagnosis not present

## 2017-07-25 DIAGNOSIS — Q211 Atrial septal defect: Secondary | ICD-10-CM | POA: Diagnosis not present

## 2017-07-25 DIAGNOSIS — I10 Essential (primary) hypertension: Secondary | ICD-10-CM

## 2017-07-25 DIAGNOSIS — M1990 Unspecified osteoarthritis, unspecified site: Secondary | ICD-10-CM | POA: Diagnosis not present

## 2017-07-25 DIAGNOSIS — Z9181 History of falling: Secondary | ICD-10-CM | POA: Diagnosis not present

## 2017-07-27 DIAGNOSIS — I129 Hypertensive chronic kidney disease with stage 1 through stage 4 chronic kidney disease, or unspecified chronic kidney disease: Secondary | ICD-10-CM | POA: Diagnosis not present

## 2017-07-27 DIAGNOSIS — M1990 Unspecified osteoarthritis, unspecified site: Secondary | ICD-10-CM | POA: Diagnosis not present

## 2017-07-27 DIAGNOSIS — N189 Chronic kidney disease, unspecified: Secondary | ICD-10-CM | POA: Diagnosis not present

## 2017-07-27 DIAGNOSIS — R5381 Other malaise: Secondary | ICD-10-CM | POA: Diagnosis not present

## 2017-07-27 DIAGNOSIS — Z9181 History of falling: Secondary | ICD-10-CM | POA: Diagnosis not present

## 2017-07-27 DIAGNOSIS — Q211 Atrial septal defect: Secondary | ICD-10-CM | POA: Diagnosis not present

## 2017-08-01 ENCOUNTER — Telehealth: Payer: Self-pay | Admitting: *Deleted

## 2017-08-01 NOTE — Telephone Encounter (Signed)
Received Patient's Episode Summary Report for Review from [facility]; forwarded to provider/SLS 03/18

## 2017-08-15 ENCOUNTER — Other Ambulatory Visit: Payer: Self-pay | Admitting: Family Medicine

## 2017-08-24 ENCOUNTER — Other Ambulatory Visit: Payer: Self-pay | Admitting: Cardiology

## 2017-08-24 DIAGNOSIS — I10 Essential (primary) hypertension: Secondary | ICD-10-CM

## 2017-08-26 ENCOUNTER — Other Ambulatory Visit: Payer: Self-pay | Admitting: Cardiology

## 2017-08-26 DIAGNOSIS — I10 Essential (primary) hypertension: Secondary | ICD-10-CM

## 2017-08-26 NOTE — Telephone Encounter (Signed)
°*  STAT* If patient is at the pharmacy, call can be transferred to refill team.   1. Which medications need to be refilled? (please list name of each medication and dose if known)   XARELTO 15 MG TABS tablet TAKE 1 TABLET BY MOUTH DAILY WITH SUPPER. SCHEDULE MD APPT FOR FURTHER REFILLS OR SEND REQUEST TO PCP   2. Which pharmacy/location (including street and city if local pharmacy) is medication to be sent to? [facility] [phone] - [place], [place] - [address] AT [place] [phone] (Phone) [phone] (Fax)   3. Do they need a 30 day or 90 day supply?  90   Oct 04 2017 appt with Almyra Deforest: pt has 4 pills left please give enough to last to appt per daughter

## 2017-08-29 MED ORDER — RIVAROXABAN 15 MG PO TABS: 15.0000 mg | ORAL_TABLET | Freq: Every day | ORAL | 1 refills | Status: DC

## 2017-09-13 ENCOUNTER — Other Ambulatory Visit: Payer: Self-pay | Admitting: Family Medicine

## 2017-09-20 ENCOUNTER — Ambulatory Visit (INDEPENDENT_AMBULATORY_CARE_PROVIDER_SITE_OTHER): Payer: Medicare Other | Admitting: Family Medicine

## 2017-09-20 ENCOUNTER — Encounter: Payer: Self-pay | Admitting: Family Medicine

## 2017-09-20 VITALS — BP 150/62 | HR 65 | Temp 98.7°F | Resp 18 | Wt 150.8 lb

## 2017-09-20 DIAGNOSIS — N289 Disorder of kidney and ureter, unspecified: Secondary | ICD-10-CM

## 2017-09-20 DIAGNOSIS — T1490XA Injury, unspecified, initial encounter: Secondary | ICD-10-CM | POA: Diagnosis not present

## 2017-09-20 DIAGNOSIS — D509 Iron deficiency anemia, unspecified: Secondary | ICD-10-CM

## 2017-09-20 DIAGNOSIS — I10 Essential (primary) hypertension: Secondary | ICD-10-CM | POA: Diagnosis not present

## 2017-09-20 DIAGNOSIS — E782 Mixed hyperlipidemia: Secondary | ICD-10-CM | POA: Diagnosis not present

## 2017-09-20 DIAGNOSIS — R739 Hyperglycemia, unspecified: Secondary | ICD-10-CM | POA: Diagnosis not present

## 2017-09-20 NOTE — Progress Notes (Signed)
Subjective:  I acted as a Education administrator for Dr. Charlett Blake. Holly Nunez, [place]   Patient ID: Holly Nunez, female    DOB: Oct 05, 1924, 84 y.o.   MRN: [id number]  No chief complaint on file.   HPI  Patient is in today for 3 month follow up and she is accompanied by her son. She fell last week and injured her left side. She denies any bone or joint pain that is new or severe. She has some right knee pain but that is stable and manageable. She has significant bruising all along lower left arm. She was getting in her tub using a temporary bathtub bar that came loose and she fell hard. She feels pain is improving and is not severe. No recent febrile illness or hospitalization. Denies CP/palp/SOB/HA/congestion/fevers/GI or GU c/o. Taking meds as prescribed  Patient Care Team: Mosie Lukes, MD as PCP - General (Family Medicine)   Past Medical History:  Diagnosis Date  . Arthritis   . Estrogen deficiency 05/27/2016  . Great toe pain, left 12/02/2016  . H/O measles   . H/O mumps   . History of chicken pox   . Hyperglycemia 05/27/2016  . Hyperlipidemia   . Hypertension   . Patent foramen ovale   . Preventative health care 02/26/2015  . Renal insufficiency   . Thyroid nodule    Biopsy-negative  . TIA (transient ischemic attack)   . Urinary incontinence     Past Surgical History:  Procedure Laterality Date  . ABDOMINAL HYSTERECTOMY     fibroid  . APPENDECTOMY    . EYE SURGERY Bilateral    CATARACT REMOVAL  . JOINT REPLACEMENT    . REPLACEMENT TOTAL KNEE Right 2007  . THYROID LOBECTOMY Left 04/25/2015  . THYROID LOBECTOMY Left 04/25/2015   Procedure: LEFT THYROID LOBECTOMY;  Surgeon: Armandina Gemma, MD;  Location: [facility];  Service: General;  Laterality: Left;  . TRIGGER FINGER RELEASE  2007    Family History  Problem Relation Age of Onset  . Heart disease Mother   . Hypertension Mother   . Kidney disease Sister   . Mental illness Sister        suicide, depresion  . Gout Brother   .  Hypertension Son   . Cancer Maternal Grandmother   . Cancer Sister   . Bone cancer Sister   . Cancer Sister   . Cancer Sister        kidney  . Cancer Daughter   . Pancreatic cancer Daughter   . Thyroid disease Daughter   . Thyroid disease Daughter        thyroid nodules  . Hypertension Daughter   . Irritable bowel syndrome Daughter   . Cancer Daughter        thyroid  . Cancer Brother   . Prostate cancer Brother     Social History   Socioeconomic History  . Marital status: Widowed    Spouse name: Not on file  . Number of children: 4  . Years of education: Not on file  . Highest education level: Not on file  Occupational History  . Not on file  Social Needs  . Financial resource strain: Not on file  . Food insecurity:    Worry: Not on file    Inability: Not on file  . Transportation needs:    Medical: Not on file    Non-medical: Not on file  Tobacco Use  . Smoking status: Never Smoker  . Smokeless tobacco: Never Used  Substance and Sexual Activity  . Alcohol use: No  . Drug use: No  . Sexual activity: Not Currently    Partners: Male    Comment: no dietary restrictions, lives with 2 daughters  Lifestyle  . Physical activity:    Days per week: Not on file    Minutes per session: Not on file  . Stress: Not on file  Relationships  . Social connections:    Talks on phone: Not on file    Gets together: Not on file    Attends religious service: Not on file    Active member of club or organization: Not on file    Attends meetings of clubs or organizations: Not on file    Relationship status: Not on file  . Intimate partner violence:    Fear of current or ex partner: Not on file    Emotionally abused: Not on file    Physically abused: Not on file    Forced sexual activity: Not on file  Other Topics Concern  . Not on file  Social History Narrative   No dietary restrictions and lives with daughters.    Outpatient Medications Prior to Visit  Medication Sig  Dispense Refill  . acetaminophen (TYLENOL) 650 MG CR tablet Take 1,300 mg by mouth every 8 (eight) hours as needed for pain.    Marland Kitchen atorvastatin (LIPITOR) 40 MG tablet TAKE 1 TABLET(40 MG) BY MOUTH DAILY 30 tablet 5  . azelastine (OPTIVAR) 0.05 % ophthalmic solution Place 0.005 drops into both eyes at bedtime.  2  . carvedilol (COREG) 3.125 MG tablet TAKE 2 TABLETS(6.25 MG) BY MOUTH TWICE DAILY 120 tablet 0  . dorzolamide (TRUSOPT) 2 % ophthalmic solution Place 2 drops into both eyes at bedtime.  6  . fluticasone (FLONASE) 50 MCG/ACT nasal spray Place 2 sprays into both nostrils daily. 16 g 6  . latanoprost (XALATAN) 0.005 % ophthalmic solution Place 1 drop into both eyes as directed.   1  . nitroGLYCERIN (NITROLINGUAL) 0.4 MG/SPRAY spray Place 1 spray under the tongue every 5 (five) minutes x 3 doses as needed for chest pain.    Marland Kitchen oxybutynin (DITROPAN-XL) 10 MG 24 hr tablet TAKE 1 TABLET(10 MG) BY MOUTH AT BEDTIME 30 tablet 5  . Rivaroxaban (XARELTO) 15 MG TABS tablet Take 1 tablet (15 mg total) by mouth daily with supper. 30 tablet 1  . timolol (TIMOPTIC) 0.5 % ophthalmic solution Place 1 drop into both eyes at bedtime.  6  . triamterene-hydrochlorothiazide (MAXZIDE-25) 37.5-25 MG tablet TAKE 1 TABLET BY MOUTH DAILY 30 tablet 5   No facility-administered medications prior to visit.     Allergies  Allergen Reactions  . Alphagan [Brimonidine] Other (See Comments)    Secondary infection and vision changes    Review of Systems  Constitutional: Negative for fever and malaise/fatigue.  HENT: Negative for congestion.   Eyes: Negative for blurred vision.  Respiratory: Negative for shortness of breath.   Cardiovascular: Negative for chest pain, palpitations and leg swelling.  Gastrointestinal: Negative for abdominal pain, blood in stool and nausea.  Genitourinary: Negative for dysuria and frequency.  Musculoskeletal: Positive for back pain, falls and joint pain.  Skin: Negative for rash.    Neurological: Negative for dizziness, loss of consciousness and headaches.  Endo/Heme/Allergies: Negative for environmental allergies.  Psychiatric/Behavioral: Negative for depression. The patient is not nervous/anxious.        Objective:    Physical Exam  Constitutional: She is oriented to person, place, and time.  She appears well-developed and well-nourished. No distress.  HENT:  Head: Normocephalic and atraumatic.  Nose: Nose normal.  Eyes: Right eye exhibits no discharge. Left eye exhibits no discharge.  Neck: Normal range of motion. Neck supple.  Cardiovascular: Normal rate and regular rhythm.  No murmur heard. Pulmonary/Chest: Effort normal and breath sounds normal.  Abdominal: Soft. Bowel sounds are normal. There is no tenderness.  Musculoskeletal: She exhibits no edema.  Neurological: She is alert and oriented to person, place, and time.  Skin: Skin is warm and dry.  Psychiatric: She has a normal mood and affect.  Nursing note and vitals reviewed.   BP (!) 150/62 (BP Location: Left Arm, Patient Position: Sitting, Cuff Size: Normal)   Pulse 65   Temp 98.7 F (37.1 C) (Oral)   Resp 18   Wt 150 lb 12.8 oz (68.4 kg)   SpO2 100%   BMI 29.45 kg/m  Wt Readings from Last 3 Encounters:  09/20/17 150 lb 12.8 oz (68.4 kg)  06/21/17 149 lb 12.8 oz (67.9 kg)  12/02/16 145 lb 6.4 oz (66 kg)   BP Readings from Last 3 Encounters:  09/20/17 (!) 150/62  06/21/17 140/62  12/02/16 140/89     Immunization History  Administered Date(s) Administered  . Influenza,inj,Quad PF,6+ Mos 02/19/2014, 01/17/2015  . Influenza-Unspecified 02/17/2017  . Pneumococcal Conjugate-13 11/25/2015  . Pneumococcal Polysaccharide-23 05/16/2013    Health Maintenance  Topic Date Due  . TETANUS/TDAP  02/27/1944  . INFLUENZA VACCINE  12/15/2017  . DEXA SCAN  Completed  . PNA vac Low Risk Adult  Completed    Lab Results  Component Value Date   WBC 6.7 09/20/2017   HGB 11.6 (L) 09/20/2017    HCT 34.5 (L) 09/20/2017   PLT 230.0 09/20/2017   GLUCOSE 107 (H) 09/20/2017   CHOL 140 09/20/2017   TRIG 106.0 09/20/2017   HDL 72.10 09/20/2017   LDLCALC 47 09/20/2017   ALT 13 09/20/2017   AST 20 09/20/2017   NA 139 09/20/2017   K 4.2 09/20/2017   CL 100 09/20/2017   CREATININE 1.15 09/20/2017   BUN 23 09/20/2017   CO2 29 09/20/2017   TSH 1.35 09/20/2017   INR 1.13 04/25/2015   HGBA1C 5.7 09/20/2017    Lab Results  Component Value Date   TSH 1.35 09/20/2017   Lab Results  Component Value Date   WBC 6.7 09/20/2017   HGB 11.6 (L) 09/20/2017   HCT 34.5 (L) 09/20/2017   MCV 97.0 09/20/2017   PLT 230.0 09/20/2017   Lab Results  Component Value Date   NA 139 09/20/2017   K 4.2 09/20/2017   CO2 29 09/20/2017   GLUCOSE 107 (H) 09/20/2017   BUN 23 09/20/2017   CREATININE 1.15 09/20/2017   BILITOT 1.0 09/20/2017   ALKPHOS 57 09/20/2017   AST 20 09/20/2017   ALT 13 09/20/2017   PROT 7.4 09/20/2017   ALBUMIN 4.2 09/20/2017   CALCIUM 10.3 09/20/2017   ANIONGAP 9 04/18/2015   GFR 56.94 (L) 09/20/2017   Lab Results  Component Value Date   CHOL 140 09/20/2017   Lab Results  Component Value Date   HDL 72.10 09/20/2017   Lab Results  Component Value Date   LDLCALC 47 09/20/2017   Lab Results  Component Value Date   TRIG 106.0 09/20/2017   Lab Results  Component Value Date   CHOLHDL 2 09/20/2017   Lab Results  Component Value Date   HGBA1C 5.7 09/20/2017  Assessment & Plan:   Problem List Items Addressed This Visit    HTN (hypertension) - Primary    Well controlled, no changes to meds. Encouraged heart healthy diet such as the DASH diet and exercise as tolerated.       Relevant Orders   CBC (Completed)   Comprehensive metabolic panel (Completed)   TSH (Completed)   CK (Creatine Kinase) (Completed)   Hyperlipidemia    Encouraged heart healthy diet, increase exercise, avoid trans fats, consider a krill oil cap daily      Relevant  Orders   Lipid panel (Completed)   Anemia   Relevant Orders   CBC (Completed)   Renal insufficiency   Relevant Orders   Comprehensive metabolic panel (Completed)   Hyperglycemia    hgba1c acceptable, minimize simple carbs. Increase exercise as tolerated.       Relevant Orders   Hemoglobin A1c (Completed)   Trauma    She fell last week and injured her left side. She denies any bone or joint pain that is new or severe. She has some right knee pain but that is stable and manageable. She has significant bruising all along lower left arm. She was getting in her tub using a temporary bathtub bar that came loose and she fell hard. She feels pain is improving and is not severe.          I am having [name] maintain her nitroGLYCERIN, acetaminophen, fluticasone, latanoprost, azelastine, dorzolamide, timolol, triamterene-hydrochlorothiazide, oxybutynin, atorvastatin, Rivaroxaban, and carvedilol.  No orders of the defined types were placed in this encounter.   CMA served as Education administrator during this visit. History, Physical and Plan performed by medical provider. Documentation and orders reviewed and attested to.  Penni Homans, MD

## 2017-09-20 NOTE — Patient Instructions (Signed)

## 2017-09-21 LAB — CBC
HCT: 34.5 % — ABNORMAL LOW (ref 36.0–46.0)
HEMOGLOBIN: 11.6 g/dL — AB (ref 12.0–15.0)
MCHC: 33.7 g/dL (ref 30.0–36.0)
MCV: 97 fl (ref 78.0–100.0)
PLATELETS: 230 10*3/uL (ref 150.0–400.0)
RBC: 3.56 Mil/uL — ABNORMAL LOW (ref 3.87–5.11)
RDW: 12.9 % (ref 11.5–15.5)
WBC: 6.7 10*3/uL (ref 4.0–10.5)

## 2017-09-21 LAB — COMPREHENSIVE METABOLIC PANEL
ALBUMIN: 4.2 g/dL (ref 3.5–5.2)
ALK PHOS: 57 U/L (ref 39–117)
ALT: 13 U/L (ref 0–35)
AST: 20 U/L (ref 0–37)
BUN: 23 mg/dL (ref 6–23)
CALCIUM: 10.3 mg/dL (ref 8.4–10.5)
CO2: 29 mEq/L (ref 19–32)
Chloride: 100 mEq/L (ref 96–112)
Creatinine, Ser: 1.15 mg/dL (ref 0.40–1.20)
GFR: 56.94 mL/min — AB (ref >60.00)
Glucose, Bld: 107 mg/dL — ABNORMAL HIGH (ref 70–99)
POTASSIUM: 4.2 meq/L (ref 3.5–5.1)
Sodium: 139 mEq/L (ref 135–145)
TOTAL PROTEIN: 7.4 g/dL (ref 6.0–8.3)
Total Bilirubin: 1 mg/dL (ref 0.2–1.2)

## 2017-09-21 LAB — HEMOGLOBIN A1C: HEMOGLOBIN A1C: 5.7 % (ref 4.6–6.5)

## 2017-09-21 LAB — LIPID PANEL
CHOLESTEROL: 140 mg/dL (ref 0–200)
HDL: 72.1 mg/dL (ref >39.00)
LDL Cholesterol: 47 mg/dL (ref 0–99)
NonHDL: 68.15
TRIGLYCERIDES: 106 mg/dL (ref 0.0–149.0)
Total CHOL/HDL Ratio: 2
VLDL: 21.2 mg/dL (ref 0.0–40.0)

## 2017-09-21 LAB — TSH: TSH: 1.35 u[IU]/mL (ref 0.35–4.50)

## 2017-09-21 LAB — CK: CK TOTAL: 128 U/L (ref 7–177)

## 2017-09-23 DIAGNOSIS — T1490XA Injury, unspecified, initial encounter: Secondary | ICD-10-CM | POA: Insufficient documentation

## 2017-09-23 NOTE — Assessment & Plan Note (Signed)
hgba1c acceptable, minimize simple carbs. Increase exercise as tolerated.  

## 2017-09-23 NOTE — Assessment & Plan Note (Signed)
Encouraged heart healthy diet, increase exercise, avoid trans fats, consider a krill oil cap daily 

## 2017-09-23 NOTE — Assessment & Plan Note (Signed)
Well controlled, no changes to meds. Encouraged heart healthy diet such as the DASH diet and exercise as tolerated.  °

## 2017-09-23 NOTE — Assessment & Plan Note (Signed)
She fell last week and injured her left side. She denies any bone or joint pain that is new or severe. She has some right knee pain but that is stable and manageable. She has significant bruising all along lower left arm. She was getting in her tub using a temporary bathtub bar that came loose and she fell hard. She feels pain is improving and is not severe.

## 2017-10-04 ENCOUNTER — Ambulatory Visit (INDEPENDENT_AMBULATORY_CARE_PROVIDER_SITE_OTHER): Payer: Medicare Other | Admitting: Physician Assistant

## 2017-10-04 ENCOUNTER — Ambulatory Visit: Payer: Medicare Other | Admitting: Podiatry

## 2017-10-04 ENCOUNTER — Encounter: Payer: Self-pay | Admitting: Physician Assistant

## 2017-10-04 VITALS — BP 144/74 | HR 73 | Ht 60.0 in | Wt 149.0 lb

## 2017-10-04 DIAGNOSIS — R200 Anesthesia of skin: Secondary | ICD-10-CM | POA: Diagnosis not present

## 2017-10-04 DIAGNOSIS — G459 Transient cerebral ischemic attack, unspecified: Secondary | ICD-10-CM

## 2017-10-04 DIAGNOSIS — I10 Essential (primary) hypertension: Secondary | ICD-10-CM

## 2017-10-04 DIAGNOSIS — E785 Hyperlipidemia, unspecified: Secondary | ICD-10-CM

## 2017-10-04 DIAGNOSIS — Q211 Atrial septal defect: Secondary | ICD-10-CM

## 2017-10-04 DIAGNOSIS — Q2112 Patent foramen ovale: Secondary | ICD-10-CM

## 2017-10-04 MED ORDER — RIVAROXABAN 15 MG PO TABS: 15.0000 mg | ORAL_TABLET | Freq: Every day | ORAL | 10 refills | Status: DC

## 2017-10-04 NOTE — Patient Instructions (Signed)
Medication Instructions:  Your physician recommends that you continue on your current medications as directed. Please refer to the Current Medication list given to you today.  Labwork: None   Testing/Procedures: None   Follow-Up: Your physician wants you to follow-up in: 12 months with Dr Stanford Breed. You will receive a reminder letter in the mail two months in advance. If you don't receive a letter, please call our office to schedule the follow-up appointment.  Any Other Special Instructions Will Be Listed Below (If Applicable).  If you need a refill on your cardiac medications before your next appointment, please call your pharmacy.

## 2017-10-04 NOTE — Progress Notes (Signed)
Cardiology Office Note    Date:  10/04/2017   ID:  Holly Nunez, DOB [birth year], MRN [id number]  PCP:  Mosie Lukes, MD  Cardiologist:  Dr. Stanford Breed  Chief Complaint  Patient presents with  . Follow-up    seen for Dr. Stanford Breed.     History of Present Illness:  Holly Nunez is a 84 y.o. female with PMH of HTN, HLD, patent foramen ovale, history of TIA, and thyroid nodule with negative biopsy.  She moved to [place] from [place].  Echocardiogram in November 2014 showed normal LV function, mild to moderate LVH, grade 2 DD, aneurysmal atrial septum with PFO.  There was moderate atherosclerosis of the aortic arch.  She has a history of TIA and was initially treated with Plavix and aspirin but had recurrence and later transitioned to Xarelto.  CT obtained in September 2016 showed thyromegaly and coronary calcification.  Myoview in October 2016 showed EF 75% with normal perfusion.  Her last office visit with Dr. Stanford Breed was in October 2017, she was doing well at the time.  Since last year, patient has been doing very well.  Unfortunately she did fall recently out of the bathtub after the handlebar broke off.  She hit her left side, however she has since recovered.  She denies any other bleeding issues.  Recent lab work shows her hemoglobin did drop from 13.5 down to 11.6.  Otherwise her renal function and electrolyte is okay.  She does occasionally still have palpitation probably once a week and this is very transient.  No further adjustment of the carvedilol is necessary at this time.  She also complain of occasional numbness on the left hand, this does not follow any dermatome to suggest ulnar or radial nerve compression.  Her radial artery has strong bounding pulse.  I recommended continue observation for the her symptom for now.  Otherwise I will refill her Xarelto and have her see Dr. Stanford Breed in 1 year.   Past Medical History:  Diagnosis Date  . Arthritis   . Estrogen  deficiency 05/27/2016  . Great toe pain, left 12/02/2016  . H/O measles   . H/O mumps   . History of chicken pox   . Hyperglycemia 05/27/2016  . Hyperlipidemia   . Hypertension   . Patent foramen ovale   . Preventative health care 02/26/2015  . Renal insufficiency   . Thyroid nodule    Biopsy-negative  . TIA (transient ischemic attack)   . Urinary incontinence     Past Surgical History:  Procedure Laterality Date  . ABDOMINAL HYSTERECTOMY     fibroid  . APPENDECTOMY    . EYE SURGERY Bilateral    CATARACT REMOVAL  . JOINT REPLACEMENT    . REPLACEMENT TOTAL KNEE Right 2007  . THYROID LOBECTOMY Left 04/25/2015  . THYROID LOBECTOMY Left 04/25/2015   Procedure: LEFT THYROID LOBECTOMY;  Surgeon: Armandina Gemma, MD;  Location: [facility];  Service: General;  Laterality: Left;  . TRIGGER FINGER RELEASE  2007    Current Medications: Outpatient Medications Prior to Visit  Medication Sig Dispense Refill  . acetaminophen (TYLENOL) 650 MG CR tablet Take 1,300 mg by mouth every 8 (eight) hours as needed for pain.    Marland Kitchen atorvastatin (LIPITOR) 40 MG tablet TAKE 1 TABLET(40 MG) BY MOUTH DAILY 30 tablet 5  . azelastine (OPTIVAR) 0.05 % ophthalmic solution Place 0.005 drops into both eyes at bedtime.  2  . carvedilol (COREG) 3.125 MG tablet TAKE 2 TABLETS(6.25 MG) BY  MOUTH TWICE DAILY 120 tablet 0  . dorzolamide (TRUSOPT) 2 % ophthalmic solution Place 2 drops into both eyes at bedtime.  6  . latanoprost (XALATAN) 0.005 % ophthalmic solution Place 1 drop into both eyes as directed.   1  . nitroGLYCERIN (NITROLINGUAL) 0.4 MG/SPRAY spray Place 1 spray under the tongue every 5 (five) minutes x 3 doses as needed for chest pain.    Marland Kitchen oxybutynin (DITROPAN-XL) 10 MG 24 hr tablet TAKE 1 TABLET(10 MG) BY MOUTH AT BEDTIME 30 tablet 5  . timolol (TIMOPTIC) 0.5 % ophthalmic solution Place 1 drop into both eyes at bedtime.  6  . triamterene-hydrochlorothiazide (MAXZIDE-25) 37.5-25 MG tablet TAKE 1 TABLET BY MOUTH  DAILY 30 tablet 5  . Rivaroxaban (XARELTO) 15 MG TABS tablet Take 1 tablet (15 mg total) by mouth daily with supper. 30 tablet 1  . fluticasone (FLONASE) 50 MCG/ACT nasal spray Place 2 sprays into both nostrils daily. 16 g 6   No facility-administered medications prior to visit.      Allergies:   Alphagan [brimonidine]   Social History   Socioeconomic History  . Marital status: Widowed    Spouse name: Not on file  . Number of children: 4  . Years of education: Not on file  . Highest education level: Not on file  Occupational History  . Not on file  Social Needs  . Financial resource strain: Not on file  . Food insecurity:    Worry: Not on file    Inability: Not on file  . Transportation needs:    Medical: Not on file    Non-medical: Not on file  Tobacco Use  . Smoking status: Never Smoker  . Smokeless tobacco: Never Used  Substance and Sexual Activity  . Alcohol use: No  . Drug use: No  . Sexual activity: Not Currently    Partners: Male    Comment: no dietary restrictions, lives with 2 daughters  Lifestyle  . Physical activity:    Days per week: Not on file    Minutes per session: Not on file  . Stress: Not on file  Relationships  . Social connections:    Talks on phone: Not on file    Gets together: Not on file    Attends religious service: Not on file    Active member of club or organization: Not on file    Attends meetings of clubs or organizations: Not on file    Relationship status: Not on file  Other Topics Concern  . Not on file  Social History Narrative   No dietary restrictions and lives with daughters.     Family History:  The patient's family history includes Bone cancer in her sister; Cancer in her brother, daughter, daughter, maternal grandmother, sister, sister, and sister; Gout in her brother; Heart disease in her mother; Hypertension in her daughter, mother, and son; Irritable bowel syndrome in her daughter; Kidney disease in her sister; Mental  illness in her sister; Pancreatic cancer in her daughter; Prostate cancer in her brother; Thyroid disease in her daughter and daughter.   ROS:   Please see the history of present illness.    ROS All other systems reviewed and are negative.   PHYSICAL EXAM:   VS:  BP (!) 144/74   Pulse 73   Ht 5' (1.524 m)   Wt 149 lb (67.6 kg)   BMI 29.10 kg/m    GEN: Well nourished, well developed, in no acute distress  HEENT: normal  Neck: no JVD, carotid bruits, or masses Cardiac: RRR; no rubs, or gallops,no edema  1/6 systolic murmur at RUSB Respiratory:  clear to auscultation bilaterally, normal work of breathing GI: soft, nontender, nondistended, + BS MS: no deformity or atrophy  Skin: warm and dry, no rash Neuro:  Alert and Oriented x 3, Strength and sensation are intact Psych: euthymic mood, full affect  Wt Readings from Last 3 Encounters:  10/04/17 149 lb (67.6 kg)  09/20/17 150 lb 12.8 oz (68.4 kg)  06/21/17 149 lb 12.8 oz (67.9 kg)      Studies/Labs Reviewed:   EKG:  EKG is ordered today.  The ekg ordered today demonstrates normal sinus rhythm without significant ST-T wave changes.  Recent Labs: 09/20/2017: ALT 13; BUN 23; Creatinine, Ser 1.15; Hemoglobin 11.6; Platelets 230.0; Potassium 4.2; Sodium 139; TSH 1.35   Lipid Panel    Component Value Date/Time   CHOL 140 09/20/2017 1546   TRIG 106.0 09/20/2017 1546   HDL 72.10 09/20/2017 1546   CHOLHDL 2 09/20/2017 1546   VLDL 21.2 09/20/2017 1546   LDLCALC 47 09/20/2017 1546    Additional studies/ records that were reviewed today include:   Myoview 02/27/2015 Study Highlights    The left ventricular ejection fraction is hyperdynamic (>65%).  Nuclear stress EF: 75%.  There was no ST segment deviation noted during stress.  The study is normal.  This is a low risk study.   Normal lexiscan nuclear stress test demonstrating normal myocardial perfusion and function: EF 75%.      ASSESSMENT:    1. Numbness of  left hand   2. Essential hypertension   3. Hyperlipidemia, unspecified hyperlipidemia type   4. Patent foramen ovale   5. TIA (transient ischemic attack)      PLAN:  In order of problems listed above:  1. Left hand numbness: This does not follow specific dermatome to suggest radial or ulnar nerve compression, she has very good pulse in the radial artery.  I would recommend continue observation.  2. Hypertension: Blood pressure stable for her age  73. Hyperlipidemia: Continue on Lipitor 40 mg daily.  Recent lipid panel obtained on 09/20/2017 was stable  4. History of TIA: Currently on low-dose Xarelto given creatinine clearance <50.  Denies any obvious bleeding issue, recent hemoglobin trended down slightly after a fall.  Continue on current medication.  She does have occasional palpitation however it is very infrequent and no further work-up is needed.  It is unclear to me whether she is truly had a diagnosis of atrial fibrillation associated with TIA.  5. Patent foramen ovale: Seen on previous echocardiogram    Medication Adjustments/Labs and Tests Ordered: Current medicines are reviewed at length with the patient today.  Concerns regarding medicines are outlined above.  Medication changes, Labs and Tests ordered today are listed in the Patient Instructions below. Patient Instructions  Medication Instructions:  Your physician recommends that you continue on your current medications as directed. Please refer to the Current Medication list given to you today.  Labwork: None   Testing/Procedures: None   Follow-Up: Your physician wants you to follow-up in: 12 months with Dr Stanford Breed. You will receive a reminder letter in the mail two months in advance. If you don't receive a letter, please call our office to schedule the follow-up appointment.  Any Other Special Instructions Will Be Listed Below (If Applicable).  If you need a refill on your cardiac medications before your next  appointment, please call your pharmacy.  Hilbert Corrigan, [place]  10/04/2017 1:30 PM    [address], [place], [place]  [address] Phone: [phone]; Fax: [phone]

## 2017-10-13 ENCOUNTER — Ambulatory Visit (INDEPENDENT_AMBULATORY_CARE_PROVIDER_SITE_OTHER): Payer: Medicare Other | Admitting: Podiatry

## 2017-10-13 ENCOUNTER — Encounter: Payer: Self-pay | Admitting: Podiatry

## 2017-10-13 DIAGNOSIS — M79674 Pain in right toe(s): Secondary | ICD-10-CM

## 2017-10-13 DIAGNOSIS — M79675 Pain in left toe(s): Secondary | ICD-10-CM | POA: Diagnosis not present

## 2017-10-13 DIAGNOSIS — B351 Tinea unguium: Secondary | ICD-10-CM | POA: Diagnosis not present

## 2017-10-14 NOTE — Progress Notes (Signed)
Subjective: 84 y.o. returns the office today for painful, elongated, thickened toenails which she cannot trim herself. Denies any redness or drainage around the nails. Denies any acute changes since last appointment and no new complaints today. Denies any systemic complaints such as fevers, chills, nausea, vomiting.   PCP: Mosie Lukes, MD  She has no new concerns today.   Objective: NAD DP/PT pulses palpable, CRT less than 3 seconds Nails hypertrophic, dystrophic, elongated, brittle, discolored 10. There is tenderness overlying the nails 1-5 bilaterally. There is no surrounding erythema or drainage along the nail sites. No open lesions or pre-ulcerative lesions are identified. No other areas of tenderness bilateral lower extremities. No overlying edema, erythema, increased warmth. No pain with calf compression, swelling, warmth, erythema.  Assessment: Patient presents with symptomatic onychomycosis  Plan: -Treatment options including alternatives, risks, complications were discussed -Nails sharply debrided 10 without complication/bleeding. -Discussed daily foot inspection. If there are any changes, to call the office immediately.  -Follow-up in 3 months or sooner if any problems are to arise. In the meantime, encouraged to call the office with any questions, concerns, changes symptoms.  Celesta Gentile, DPM

## 2017-10-19 DIAGNOSIS — H401132 Primary open-angle glaucoma, bilateral, moderate stage: Secondary | ICD-10-CM | POA: Diagnosis not present

## 2017-10-22 ENCOUNTER — Other Ambulatory Visit: Payer: Self-pay | Admitting: Family Medicine

## 2017-11-21 ENCOUNTER — Other Ambulatory Visit: Payer: Self-pay | Admitting: Family Medicine

## 2017-12-05 ENCOUNTER — Ambulatory Visit: Payer: Medicare Other | Admitting: *Deleted

## 2017-12-21 ENCOUNTER — Other Ambulatory Visit: Payer: Self-pay | Admitting: Family Medicine

## 2017-12-21 NOTE — Progress Notes (Signed)
Subjective:   Holly Nunez is a 84 y.o. female who presents for Medicare Annual (Subsequent) preventive examination. Pt accompanied by her son Kerry Dory.  Review of Systems: No ROS.  Medicare Wellness Visit. Additional risk factors are reflected in the social history.  Cardiac Risk Factors include: advanced age (>52men, >82 women);dyslipidemia;hypertension Sleep patterns: Sleeps well Home Safety/Smoke Alarms: Feels safe in home. Smoke alarms in place.  Living environment; residence and Firearm Safety: Lives with son and 2 daughters. 2story home. Stays on 1st floor. Shower chair.  [facility] [address]-every 3 months.  Female:        Mammo-  declines     Dexa scan-  06/07/16         Objective:     Vitals: BP 122/60 (BP Location: Left Arm, Patient Position: Sitting, Cuff Size: Normal)   Pulse 63   Ht 5' (1.524 m)   Wt 149 lb 12.8 oz (67.9 kg)   SpO2 97%   BMI 29.26 kg/m   Body mass index is 29.26 kg/m.  Advanced Directives 12/23/2017 11/25/2015 04/25/2015  Does Patient Have a Medical Advance Directive? No No No  Would patient like information on creating a medical advance directive? Yes (MAU/Ambulatory/Procedural Areas - Information given) Yes - Educational materials given Yes - Scientist, clinical (histocompatibility and immunogenetics) given    Tobacco Social History   Tobacco Use  Smoking Status Never Smoker  Smokeless Tobacco Never Used     Counseling given: Not Answered   Clinical Intake: Pain : No/denies pain      Past Medical History:  Diagnosis Date  . Arthritis   . Estrogen deficiency 05/27/2016  . Great toe pain, left 12/02/2016  . H/O measles   . H/O mumps   . History of chicken pox   . Hyperglycemia 05/27/2016  . Hyperlipidemia   . Hypertension   . Patent foramen ovale   . Preventative health care 02/26/2015  . Renal insufficiency   . Thyroid nodule    Biopsy-negative  . TIA (transient ischemic attack)   . Urinary incontinence    Past Surgical History:  Procedure Laterality  Date  . ABDOMINAL HYSTERECTOMY     fibroid  . APPENDECTOMY    . EYE SURGERY Bilateral    CATARACT REMOVAL  . JOINT REPLACEMENT    . REPLACEMENT TOTAL KNEE Right 2007  . THYROID LOBECTOMY Left 04/25/2015  . THYROID LOBECTOMY Left 04/25/2015   Procedure: LEFT THYROID LOBECTOMY;  Surgeon: Armandina Gemma, MD;  Location: [facility];  Service: General;  Laterality: Left;  . TRIGGER FINGER RELEASE  2007   Family History  Problem Relation Age of Onset  . Heart disease Mother   . Hypertension Mother   . Kidney disease Sister   . Mental illness Sister        suicide, depresion  . Gout Brother   . Hypertension Son   . Cancer Maternal Grandmother   . Cancer Sister   . Bone cancer Sister   . Cancer Sister   . Cancer Sister        kidney  . Cancer Daughter   . Pancreatic cancer Daughter   . Thyroid disease Daughter   . Thyroid disease Daughter        thyroid nodules  . Hypertension Daughter   . Irritable bowel syndrome Daughter   . Cancer Daughter        thyroid  . Cancer Brother   . Prostate cancer Brother    Social History   Socioeconomic History  .  Marital status: Widowed    Spouse name: Not on file  . Number of children: 4  . Years of education: Not on file  . Highest education level: Not on file  Occupational History  . Not on file  Social Needs  . Financial resource strain: Not on file  . Food insecurity:    Worry: Not on file    Inability: Not on file  . Transportation needs:    Medical: Not on file    Non-medical: Not on file  Tobacco Use  . Smoking status: Never Smoker  . Smokeless tobacco: Never Used  Substance and Sexual Activity  . Alcohol use: No  . Drug use: No  . Sexual activity: Not Currently    Partners: Male    Comment: no dietary restrictions, lives with 2 daughters  Lifestyle  . Physical activity:    Days per week: Not on file    Minutes per session: Not on file  . Stress: Not on file  Relationships  . Social connections:    Talks on phone: Not  on file    Gets together: Not on file    Attends religious service: Not on file    Active member of club or organization: Not on file    Attends meetings of clubs or organizations: Not on file    Relationship status: Not on file  Other Topics Concern  . Not on file  Social History Narrative   No dietary restrictions and lives with daughters.    Outpatient Encounter Medications as of 12/23/2017  Medication Sig  . acetaminophen (TYLENOL) 650 MG CR tablet Take 1,300 mg by mouth every 8 (eight) hours as needed for pain.  Marland Kitchen atorvastatin (LIPITOR) 40 MG tablet TAKE 1 TABLET BY MOUTH DAILY  . azelastine (OPTIVAR) 0.05 % ophthalmic solution Place 0.005 drops into both eyes at bedtime.  . carvedilol (COREG) 3.125 MG tablet TAKE 2 TABLETS(6.25 MG) BY MOUTH TWICE DAILY  . dorzolamide (TRUSOPT) 2 % ophthalmic solution Place 2 drops into both eyes at bedtime.  Marland Kitchen latanoprost (XALATAN) 0.005 % ophthalmic solution Place 1 drop into both eyes as directed.   . nitroGLYCERIN (NITROLINGUAL) 0.4 MG/SPRAY spray Place 1 spray under the tongue every 5 (five) minutes x 3 doses as needed for chest pain.  Marland Kitchen oxybutynin (DITROPAN-XL) 10 MG 24 hr tablet TAKE 1 TABLET(10 MG) BY MOUTH AT BEDTIME  . Rivaroxaban (XARELTO) 15 MG TABS tablet Take 1 tablet (15 mg total) by mouth daily with supper.  . timolol (TIMOPTIC) 0.5 % ophthalmic solution Place 1 drop into both eyes at bedtime.  . triamterene-hydrochlorothiazide (MAXZIDE-25) 37.5-25 MG tablet TAKE 1 TABLET BY MOUTH DAILY   No facility-administered encounter medications on file as of 12/23/2017.     Activities of Daily Living In your present state of health, do you have any difficulty performing the following activities: 12/23/2017  Hearing? N  Vision? N  Difficulty concentrating or making decisions? N  Walking or climbing stairs? N  Dressing or bathing? Y  Comment Daughter assists.   Doing errands, shopping? Y  Comment does not Physiological scientist and eating ? N   Using the Toilet? N  In the past six months, have you accidently leaked urine? N  Do you have problems with loss of bowel control? N  Managing your Medications? Y  Managing your Finances? Y  Housekeeping or managing your Housekeeping? Y  Some recent data might be hidden    Patient Care Team: Mosie Lukes,  MD as PCP - General (Family Medicine)    Assessment:   This is a routine wellness examination for [place]. Physical assessment deferred to PCP.  Exercise Activities and Dietary recommendations Current Exercise Habits: The patient does not participate in regular exercise at present Diet (meal preparation, eat out, water intake, caffeinated beverages, dairy products, fruits and vegetables): in general, a "healthy" diet  , well balanced    Goals    . Maintain current health       Fall Risk Fall Risk  12/23/2017 11/25/2015 02/19/2014  Falls in the past year? Yes No Yes  Number falls in past yr: 1 - 2 or more  Injury with Fall? No - -  Risk for fall due to : - - Impaired balance/gait  Follow up Education provided;Falls prevention discussed - -    Depression Screen PHQ 2/9 Scores 12/23/2017 11/25/2015 02/19/2014  PHQ - 2 Score 0 0 0     Cognitive Function MMSE - Mini Mental State Exam 12/23/2017  Not completed: Refused        Immunization History  Administered Date(s) Administered  . Influenza,inj,Quad PF,6+ Mos 02/19/2014, 01/17/2015  . Influenza-Unspecified 02/17/2017  . Pneumococcal Conjugate-13 11/25/2015  . Pneumococcal Polysaccharide-23 05/16/2013   Screening Tests Health Maintenance  Topic Date Due  . TETANUS/TDAP  02/27/1944  . INFLUENZA VACCINE  12/15/2017  . DEXA SCAN  Completed  . PNA vac Low Risk Adult  Completed       Plan:    Continue to eat heart healthy diet (full of fruits, vegetables, whole grains, lean protein, water--limit salt, fat, and sugar intake) and increase physical activity as tolerated.  Continue doing brain stimulating activities  (puzzles, reading, adult coloring books, staying active) to keep memory sharp.   Bring a copy of your living will and/or healthcare power of attorney to your next office visit.   I have personally reviewed and noted the following in the patient's chart:   . Medical and social history . Use of alcohol, tobacco or illicit drugs  . Current medications and supplements . Functional ability and status . Nutritional status . Physical activity . Advanced directives . List of other physicians . Hospitalizations, surgeries, and ER visits in previous 12 months . Vitals . Screenings to include cognitive, depression, and falls . Referrals and appointments  In addition, I have reviewed and discussed with patient certain preventive protocols, quality metrics, and best practice recommendations. A written personalized care plan for preventive services as well as general preventive health recommendations were provided to patient.     Shela Nevin, [place]  12/23/2017

## 2017-12-23 ENCOUNTER — Encounter: Payer: Self-pay | Admitting: *Deleted

## 2017-12-23 ENCOUNTER — Ambulatory Visit (INDEPENDENT_AMBULATORY_CARE_PROVIDER_SITE_OTHER): Payer: Medicare Other | Admitting: *Deleted

## 2017-12-23 VITALS — BP 122/60 | HR 63 | Ht 60.0 in | Wt 149.8 lb

## 2017-12-23 DIAGNOSIS — Z0000 Encounter for general adult medical examination without abnormal findings: Secondary | ICD-10-CM | POA: Diagnosis not present

## 2017-12-23 NOTE — Patient Instructions (Signed)
Continue to eat heart healthy diet (full of fruits, vegetables, whole grains, lean protein, water--limit salt, fat, and sugar intake) and increase physical activity as tolerated.  Continue doing brain stimulating activities (puzzles, reading, adult coloring books, staying active) to keep memory sharp.   Bring a copy of your living will and/or healthcare power of attorney to your next office visit.   Holly Nunez , Thank you for taking time to come for your Medicare Wellness Visit. I appreciate your ongoing commitment to your health goals. Please review the following plan we discussed and let me know if I can assist you in the future.   These are the goals we discussed: Goals    . Maintain current health       This is a list of the screening recommended for you and due dates:  Health Maintenance  Topic Date Due  . Tetanus Vaccine  02/27/1944  . Flu Shot  12/15/2017  . DEXA scan (bone density measurement)  Completed  . Pneumonia vaccines  Completed    Health Maintenance for Postmenopausal Women Menopause is a normal process in which your reproductive ability comes to an end. This process happens gradually over a span of months to years, usually between the ages of 23 and 81. Menopause is complete when you have missed 12 consecutive menstrual periods. It is important to talk with your health care provider about some of the most common conditions that affect postmenopausal women, such as heart disease, cancer, and bone loss (osteoporosis). Adopting a healthy lifestyle and getting preventive care can help to promote your health and wellness. Those actions can also lower your chances of developing some of these common conditions. What should I know about menopause? During menopause, you may experience a number of symptoms, such as:  Moderate-to-severe hot flashes.  Night sweats.  Decrease in sex drive.  Mood swings.  Headaches.  Tiredness.  Irritability.  Memory  problems.  Insomnia.  Choosing to treat or not to treat menopausal changes is an individual decision that you make with your health care provider. What should I know about hormone replacement therapy and supplements? Hormone therapy products are effective for treating symptoms that are associated with menopause, such as hot flashes and night sweats. Hormone replacement carries certain risks, especially as you become older. If you are thinking about using estrogen or estrogen with progestin treatments, discuss the benefits and risks with your health care provider. What should I know about heart disease and stroke? Heart disease, heart attack, and stroke become more likely as you age. This may be due, in part, to the hormonal changes that your body experiences during menopause. These can affect how your body processes dietary fats, triglycerides, and cholesterol. Heart attack and stroke are both medical emergencies. There are many things that you can do to help prevent heart disease and stroke:  Have your blood pressure checked at least every 1-2 years. High blood pressure causes heart disease and increases the risk of stroke.  If you are 30-23 years old, ask your health care provider if you should take aspirin to prevent a heart attack or a stroke.  Do not use any tobacco products, including cigarettes, chewing tobacco, or electronic cigarettes. If you need help quitting, ask your health care provider.  It is important to eat a healthy diet and maintain a healthy weight. ? Be sure to include plenty of vegetables, fruits, low-fat dairy products, and lean protein. ? Avoid eating foods that are high in solid fats, added  sugars, or salt (sodium).  Get regular exercise. This is one of the most important things that you can do for your health. ? Try to exercise for at least 150 minutes each week. The type of exercise that you do should increase your heart rate and make you sweat. This is known as  moderate-intensity exercise. ? Try to do strengthening exercises at least twice each week. Do these in addition to the moderate-intensity exercise.  Know your numbers.Ask your health care provider to check your cholesterol and your blood glucose. Continue to have your blood tested as directed by your health care provider.  What should I know about cancer screening? There are several types of cancer. Take the following steps to reduce your risk and to catch any cancer development as early as possible. Breast Cancer  Practice breast self-awareness. ? This means understanding how your breasts normally appear and feel. ? It also means doing regular breast self-exams. Let your health care provider know about any changes, no matter how small.  If you are 49 or older, have a clinician do a breast exam (clinical breast exam or CBE) every year. Depending on your age, family history, and medical history, it may be recommended that you also have a yearly breast X-ray (mammogram).  If you have a family history of breast cancer, talk with your health care provider about genetic screening.  If you are at high risk for breast cancer, talk with your health care provider about having an MRI and a mammogram every year.  Breast cancer (BRCA) gene test is recommended for women who have family members with BRCA-related cancers. Results of the assessment will determine the need for genetic counseling and BRCA1 and for BRCA2 testing. BRCA-related cancers include these types: ? Breast. This occurs in males or females. ? Ovarian. ? Tubal. This may also be called fallopian tube cancer. ? Cancer of the abdominal or pelvic lining (peritoneal cancer). ? Prostate. ? Pancreatic.  Cervical, Uterine, and Ovarian Cancer Your health care provider may recommend that you be screened regularly for cancer of the pelvic organs. These include your ovaries, uterus, and vagina. This screening involves a pelvic exam, which  includes checking for microscopic changes to the surface of your cervix (Pap test).  For women ages 21-65, health care providers may recommend a pelvic exam and a Pap test every three years. For women ages 7-65, they may recommend the Pap test and pelvic exam, combined with testing for human papilloma virus (HPV), every five years. Some types of HPV increase your risk of cervical cancer. Testing for HPV may also be done on women of any age who have unclear Pap test results.  Other health care providers may not recommend any screening for nonpregnant women who are considered low risk for pelvic cancer and have no symptoms. Ask your health care provider if a screening pelvic exam is right for you.  If you have had past treatment for cervical cancer or a condition that could lead to cancer, you need Pap tests and screening for cancer for at least 20 years after your treatment. If Pap tests have been discontinued for you, your risk factors (such as having a new sexual partner) need to be reassessed to determine if you should start having screenings again. Some women have medical problems that increase the chance of getting cervical cancer. In these cases, your health care provider may recommend that you have screening and Pap tests more often.  If you have a family history  of uterine cancer or ovarian cancer, talk with your health care provider about genetic screening.  If you have vaginal bleeding after reaching menopause, tell your health care provider.  There are currently no reliable tests available to screen for ovarian cancer.  Lung Cancer Lung cancer screening is recommended for adults 49-44 years old who are at high risk for lung cancer because of a history of smoking. A yearly low-dose CT scan of the lungs is recommended if you:  Currently smoke.  Have a history of at least 30 pack-years of smoking and you currently smoke or have quit within the past 15 years. A pack-year is smoking an  average of one pack of cigarettes per day for one year.  Yearly screening should:  Continue until it has been 15 years since you quit.  Stop if you develop a health problem that would prevent you from having lung cancer treatment.  Colorectal Cancer  This type of cancer can be detected and can often be prevented.  Routine colorectal cancer screening usually begins at age 80 and continues through age 87.  If you have risk factors for colon cancer, your health care provider may recommend that you be screened at an earlier age.  If you have a family history of colorectal cancer, talk with your health care provider about genetic screening.  Your health care provider may also recommend using home test kits to check for hidden blood in your stool.  A small camera at the end of a tube can be used to examine your colon directly (sigmoidoscopy or colonoscopy). This is done to check for the earliest forms of colorectal cancer.  Direct examination of the colon should be repeated every 5-10 years until age 79. However, if early forms of precancerous polyps or small growths are found or if you have a family history or genetic risk for colorectal cancer, you may need to be screened more often.  Skin Cancer  Check your skin from head to toe regularly.  Monitor any moles. Be sure to tell your health care provider: ? About any new moles or changes in moles, especially if there is a change in a mole's shape or color. ? If you have a mole that is larger than the size of a pencil eraser.  If any of your family members has a history of skin cancer, especially at a young age, talk with your health care provider about genetic screening.  Always use sunscreen. Apply sunscreen liberally and repeatedly throughout the day.  Whenever you are outside, protect yourself by wearing long sleeves, pants, a wide-brimmed hat, and sunglasses.  What should I know about osteoporosis? Osteoporosis is a condition in  which bone destruction happens more quickly than new bone creation. After menopause, you may be at an increased risk for osteoporosis. To help prevent osteoporosis or the bone fractures that can happen because of osteoporosis, the following is recommended:  If you are 40-60 years old, get at least 1,000 mg of calcium and at least 600 mg of vitamin D per day.  If you are older than age 16 but younger than age 56, get at least 1,200 mg of calcium and at least 600 mg of vitamin D per day.  If you are older than age 81, get at least 1,200 mg of calcium and at least 800 mg of vitamin D per day.  Smoking and excessive alcohol intake increase the risk of osteoporosis. Eat foods that are rich in calcium and vitamin D, and  do weight-bearing exercises several times each week as directed by your health care provider. What should I know about how menopause affects my mental health? Depression may occur at any age, but it is more common as you become older. Common symptoms of depression include:  Low or sad mood.  Changes in sleep patterns.  Changes in appetite or eating patterns.  Feeling an overall lack of motivation or enjoyment of activities that you previously enjoyed.  Frequent crying spells.  Talk with your health care provider if you think that you are experiencing depression. What should I know about immunizations? It is important that you get and maintain your immunizations. These include:  Tetanus, diphtheria, and pertussis (Tdap) booster vaccine.  Influenza every year before the flu season begins.  Pneumonia vaccine.  Shingles vaccine.  Your health care provider may also recommend other immunizations. This information is not intended to replace advice given to you by your health care provider. Make sure you discuss any questions you have with your health care provider. Document Released: 06/25/2005 Document Revised: 11/21/2015 Document Reviewed: 02/04/2015 Elsevier Interactive  Patient Education  2018 [employer].

## 2018-01-12 ENCOUNTER — Ambulatory Visit (INDEPENDENT_AMBULATORY_CARE_PROVIDER_SITE_OTHER): Payer: Medicare Other | Admitting: Podiatry

## 2018-01-12 ENCOUNTER — Encounter: Payer: Self-pay | Admitting: Podiatry

## 2018-01-12 DIAGNOSIS — B351 Tinea unguium: Secondary | ICD-10-CM

## 2018-01-12 DIAGNOSIS — Q828 Other specified congenital malformations of skin: Secondary | ICD-10-CM | POA: Diagnosis not present

## 2018-01-12 DIAGNOSIS — M79674 Pain in right toe(s): Secondary | ICD-10-CM | POA: Diagnosis not present

## 2018-01-12 DIAGNOSIS — M79675 Pain in left toe(s): Secondary | ICD-10-CM

## 2018-01-12 NOTE — Progress Notes (Signed)
Subjective: 84 y.o. returns the office today for painful, elongated, thickened toenails which she cannot trim herself. Denies any redness or drainage around the nails.  She also states that she has a "bunion" on the left and she is pointing to a hyperkeratotic lesion in the ball of her foot.  Denies any acute changes since last appointment and no new complaints today. Denies any systemic complaints such as fevers, chills, nausea, vomiting.   PCP: Mosie Lukes, MD   Objective: NAD DP/PT pulses palpable, CRT less than 3 seconds Nails hypertrophic, dystrophic, elongated, brittle, discolored 10. There is tenderness overlying the nails 1-5 bilaterally. There is no surrounding erythema or drainage along the nail sites. Hyperkeratotic lesion left foot the metatarsal 5.  Upon debridement no underlying ulceration drainage no signs of infection No open lesions or pre-ulcerative lesions are identified. No other areas of tenderness bilateral lower extremities. No overlying edema, erythema, increased warmth. No pain with calf compression, swelling, warmth, erythema.  Assessment: Patient presents with symptomatic onychomycosis, porokeratosis  Plan: -Treatment options including alternatives, risks, complications were discussed -Nails sharply debrided 10 without complication/bleeding. -Hyperkeratotic lesion sharply debrided x1 without any complications or bleeding. -Discussed daily foot inspection. If there are any changes, to call the office immediately.  -Follow-up in 3 months or sooner if any problems are to arise. In the meantime, encouraged to call the office with any questions, concerns, changes symptoms.  Celesta Gentile, DPM

## 2018-01-20 ENCOUNTER — Other Ambulatory Visit: Payer: Self-pay | Admitting: Family Medicine

## 2018-01-20 DIAGNOSIS — D538 Other specified nutritional anemias: Secondary | ICD-10-CM

## 2018-01-20 DIAGNOSIS — N289 Disorder of kidney and ureter, unspecified: Secondary | ICD-10-CM

## 2018-01-20 DIAGNOSIS — E785 Hyperlipidemia, unspecified: Secondary | ICD-10-CM

## 2018-01-20 DIAGNOSIS — I10 Essential (primary) hypertension: Secondary | ICD-10-CM

## 2018-01-20 DIAGNOSIS — N3281 Overactive bladder: Secondary | ICD-10-CM

## 2018-01-23 ENCOUNTER — Ambulatory Visit (INDEPENDENT_AMBULATORY_CARE_PROVIDER_SITE_OTHER): Payer: Medicare Other | Admitting: Family Medicine

## 2018-01-23 VITALS — BP 122/60 | HR 59 | Temp 97.9°F | Resp 18 | Ht 60.0 in | Wt 148.2 lb

## 2018-01-23 DIAGNOSIS — E785 Hyperlipidemia, unspecified: Secondary | ICD-10-CM | POA: Diagnosis not present

## 2018-01-23 DIAGNOSIS — N289 Disorder of kidney and ureter, unspecified: Secondary | ICD-10-CM

## 2018-01-23 DIAGNOSIS — Z23 Encounter for immunization: Secondary | ICD-10-CM | POA: Diagnosis not present

## 2018-01-23 DIAGNOSIS — I10 Essential (primary) hypertension: Secondary | ICD-10-CM

## 2018-01-23 DIAGNOSIS — R739 Hyperglycemia, unspecified: Secondary | ICD-10-CM | POA: Diagnosis not present

## 2018-01-23 DIAGNOSIS — N3281 Overactive bladder: Secondary | ICD-10-CM | POA: Diagnosis not present

## 2018-01-23 DIAGNOSIS — D538 Other specified nutritional anemias: Secondary | ICD-10-CM

## 2018-01-23 LAB — CBC WITH DIFFERENTIAL/PLATELET
BASOS PCT: 0.4 % (ref 0.0–3.0)
Basophils Absolute: 0 10*3/uL (ref 0.0–0.1)
EOS PCT: 0.7 % (ref 0.0–5.0)
Eosinophils Absolute: 0 10*3/uL (ref 0.0–0.7)
HCT: 37.3 % (ref 36.0–46.0)
Hemoglobin: 12.4 g/dL (ref 12.0–15.0)
LYMPHS ABS: 1.1 10*3/uL (ref 0.7–4.0)
Lymphocytes Relative: 18.9 % (ref 12.0–46.0)
MCHC: 33.2 g/dL (ref 30.0–36.0)
MCV: 95.7 fl (ref 78.0–100.0)
MONO ABS: 0.5 10*3/uL (ref 0.1–1.0)
Monocytes Relative: 8.4 % (ref 3.0–12.0)
NEUTROS ABS: 4.4 10*3/uL (ref 1.4–7.7)
NEUTROS PCT: 71.6 % (ref 43.0–77.0)
PLATELETS: 173 10*3/uL (ref 150.0–400.0)
RBC: 3.9 Mil/uL (ref 3.87–5.11)
RDW: 13.3 % (ref 11.5–15.5)
WBC: 6.1 10*3/uL (ref 4.0–10.5)

## 2018-01-23 LAB — COMPREHENSIVE METABOLIC PANEL
ALT: 15 U/L (ref 0–35)
AST: 18 U/L (ref 0–37)
Albumin: 3.9 g/dL (ref 3.5–5.2)
Alkaline Phosphatase: 54 U/L (ref 39–117)
BILIRUBIN TOTAL: 0.7 mg/dL (ref 0.2–1.2)
BUN: 22 mg/dL (ref 6–23)
CO2: 29 meq/L (ref 19–32)
Calcium: 9.9 mg/dL (ref 8.4–10.5)
Chloride: 98 mEq/L (ref 96–112)
Creatinine, Ser: 1.4 mg/dL — ABNORMAL HIGH (ref 0.40–1.20)
GFR: 45.34 mL/min — AB (ref >60.00)
GLUCOSE: 116 mg/dL — AB (ref 70–99)
Potassium: 4.1 mEq/L (ref 3.5–5.1)
SODIUM: 134 meq/L — AB (ref 135–145)
Total Protein: 6.7 g/dL (ref 6.0–8.3)

## 2018-01-23 LAB — MICROALBUMIN / CREATININE URINE RATIO
Creatinine,U: 79.8 mg/dL
MICROALB/CREAT RATIO: 2.7 mg/g (ref 0.0–30.0)
Microalb, Ur: 2.2 mg/dL — ABNORMAL HIGH (ref 0.0–1.9)

## 2018-01-23 LAB — HEMOGLOBIN A1C: HEMOGLOBIN A1C: 5.9 % (ref 4.6–6.5)

## 2018-01-23 LAB — FERRITIN: FERRITIN: 66.9 ng/mL (ref 10.0–291.0)

## 2018-01-23 LAB — LIPID PANEL
CHOL/HDL RATIO: 2
Cholesterol: 130 mg/dL (ref 0–200)
HDL: 72.6 mg/dL (ref >39.00)
LDL CALC: 38 mg/dL (ref 0–99)
NonHDL: 57.03
TRIGLYCERIDES: 96 mg/dL (ref 0.0–149.0)
VLDL: 19.2 mg/dL (ref 0.0–40.0)

## 2018-01-23 LAB — TSH: TSH: 1.69 u[IU]/mL (ref 0.35–4.50)

## 2018-01-23 MED ORDER — TRIAMTERENE-HCTZ 37.5-25 MG PO TABS: 1.0000 | ORAL_TABLET | Freq: Every day | ORAL | 1 refills | Status: DC

## 2018-01-23 MED ORDER — ATORVASTATIN CALCIUM 40 MG PO TABS: 40.0000 mg | ORAL_TABLET | Freq: Every day | ORAL | 1 refills | Status: DC

## 2018-01-23 MED ORDER — OXYBUTYNIN CHLORIDE ER 10 MG PO TB24: ORAL_TABLET | ORAL | 1 refills | Status: DC

## 2018-01-23 MED ORDER — CARVEDILOL 3.125 MG PO TABS: ORAL_TABLET | ORAL | 1 refills | Status: DC

## 2018-01-23 NOTE — Assessment & Plan Note (Signed)
hgba1c acceptable, minimize simple carbs. Increase exercise as tolerated.  

## 2018-01-23 NOTE — Patient Instructions (Signed)
shingrix is the new shingles shot. 2 shots over 2-6 months, at pharmacy [facility] in [place]   Hypertension Hypertension is another name for high blood pressure. High blood pressure forces your heart to work harder to pump blood. This can cause problems over time. There are two numbers in a blood pressure reading. There is a top number (systolic) over a bottom number (diastolic). It is best to have a blood pressure below 120/80. Healthy choices can help lower your blood pressure. You may need medicine to help lower your blood pressure if:  Your blood pressure cannot be lowered with healthy choices.  Your blood pressure is higher than 130/80.  Follow these instructions at home: Eating and drinking  If directed, follow the DASH eating plan. This diet includes: ? Filling half of your plate at each meal with fruits and vegetables. ? Filling one quarter of your plate at each meal with whole grains. Whole grains include whole wheat pasta, brown rice, and whole grain bread. ? Eating or drinking low-fat dairy products, such as skim milk or low-fat yogurt. ? Filling one quarter of your plate at each meal with low-fat (lean) proteins. Low-fat proteins include fish, skinless chicken, eggs, beans, and tofu. ? Avoiding fatty meat, cured and processed meat, or chicken with skin. ? Avoiding premade or processed food.  Eat less than 1,500 mg of salt (sodium) a day.  Limit alcohol use to no more than 1 drink a day for nonpregnant women and 2 drinks a day for men. One drink equals 12 oz of beer, 5 oz of wine, or 1 oz of hard liquor. Lifestyle  Work with your doctor to stay at a healthy weight or to lose weight. Ask your doctor what the best weight is for you.  Get at least 30 minutes of exercise that causes your heart to beat faster (aerobic exercise) most days of the week. This may include walking, swimming, or biking.  Get at least 30 minutes of exercise that strengthens your  muscles (resistance exercise) at least 3 days a week. This may include lifting weights or pilates.  Do not use any products that contain nicotine or tobacco. This includes cigarettes and e-cigarettes. If you need help quitting, ask your doctor.  Check your blood pressure at home as told by your doctor.  Keep all follow-up visits as told by your doctor. This is important. Medicines  Take over-the-counter and prescription medicines only as told by your doctor. Follow directions carefully.  Do not skip doses of blood pressure medicine. The medicine does not work as well if you skip doses. Skipping doses also puts you at risk for problems.  Ask your doctor about side effects or reactions to medicines that you should watch for. Contact a doctor if:  You think you are having a reaction to the medicine you are taking.  You have headaches that keep coming back (recurring).  You feel dizzy.  You have swelling in your ankles.  You have trouble with your vision. Get help right away if:  You get a very bad headache.  You start to feel confused.  You feel weak or numb.  You feel faint.  You get very bad pain in your: ? Chest. ? Belly (abdomen).  You throw up (vomit) more than once.  You have trouble breathing. Summary  Hypertension is another name for high blood pressure.  Making healthy choices can help lower blood pressure. If your blood pressure cannot be controlled with healthy  choices, you may need to take medicine. This information is not intended to replace advice given to you by your health care provider. Make sure you discuss any questions you have with your health care provider. Document Released: 10/20/2007 Document Revised: 03/31/2016 Document Reviewed: 03/31/2016 Elsevier Interactive Patient Education  [employer].

## 2018-01-23 NOTE — Assessment & Plan Note (Addendum)
Well controlled, no changes to meds. Encouraged heart healthy diet such as the DASH diet and exercise as tolerated. Check urine for microalb

## 2018-01-23 NOTE — Progress Notes (Signed)
Subjective:  I acted as a Education administrator for Dr. Charlett Blake. [place], [place]  Patient ID: Holly Nunez, female    DOB: March 13, 1925, 84 y.o.   MRN: [id number]  No chief complaint on file.   HPI  Patient is in today for a 4 month follow up and she feels well. Is accompanied by family. No recent febrile illness or hospitalizations. No polyuria or polydipsia. Denies CP/palp/SOB/HA/congestion/fevers/GI or GU c/o. Taking meds as prescribed. Is trying to maintain a heart healthy diet and she is trying to stay active.   Patient Care Team: Mosie Lukes, MD as PCP - General (Family Medicine)   Past Medical History:  Diagnosis Date  . Arthritis   . Estrogen deficiency 05/27/2016  . Great toe pain, left 12/02/2016  . H/O measles   . H/O mumps   . History of chicken pox   . Hyperglycemia 05/27/2016  . Hyperlipidemia   . Hypertension   . Patent foramen ovale   . Preventative health care 02/26/2015  . Renal insufficiency   . Thyroid nodule    Biopsy-negative  . TIA (transient ischemic attack)   . Urinary incontinence     Past Surgical History:  Procedure Laterality Date  . ABDOMINAL HYSTERECTOMY     fibroid  . APPENDECTOMY    . EYE SURGERY Bilateral    CATARACT REMOVAL  . JOINT REPLACEMENT    . REPLACEMENT TOTAL KNEE Right 2007  . THYROID LOBECTOMY Left 04/25/2015  . THYROID LOBECTOMY Left 04/25/2015   Procedure: LEFT THYROID LOBECTOMY;  Surgeon: Armandina Gemma, MD;  Location: [facility];  Service: General;  Laterality: Left;  . TRIGGER FINGER RELEASE  2007    Family History  Problem Relation Age of Onset  . Heart disease Mother   . Hypertension Mother   . Kidney disease Sister   . Mental illness Sister        suicide, depresion  . Gout Brother   . Hypertension Son   . Cancer Maternal Grandmother   . Cancer Sister   . Bone cancer Sister   . Cancer Sister   . Cancer Sister        kidney  . Cancer Daughter   . Pancreatic cancer Daughter   . Thyroid disease Daughter   . Thyroid disease  Daughter        thyroid nodules  . Hypertension Daughter   . Irritable bowel syndrome Daughter   . Cancer Daughter        thyroid  . Cancer Brother   . Prostate cancer Brother     Social History   Socioeconomic History  . Marital status: Widowed    Spouse name: Not on file  . Number of children: 4  . Years of education: Not on file  . Highest education level: Not on file  Occupational History  . Not on file  Social Needs  . Financial resource strain: Not on file  . Food insecurity:    Worry: Not on file    Inability: Not on file  . Transportation needs:    Medical: Not on file    Non-medical: Not on file  Tobacco Use  . Smoking status: Never Smoker  . Smokeless tobacco: Never Used  Substance and Sexual Activity  . Alcohol use: No  . Drug use: No  . Sexual activity: Not Currently    Partners: Male    Comment: no dietary restrictions, lives with 2 daughters  Lifestyle  . Physical activity:    Days per week:  Not on file    Minutes per session: Not on file  . Stress: Not on file  Relationships  . Social connections:    Talks on phone: Not on file    Gets together: Not on file    Attends religious service: Not on file    Active member of club or organization: Not on file    Attends meetings of clubs or organizations: Not on file    Relationship status: Not on file  . Intimate partner violence:    Fear of current or ex partner: Not on file    Emotionally abused: Not on file    Physically abused: Not on file    Forced sexual activity: Not on file  Other Topics Concern  . Not on file  Social History Narrative   No dietary restrictions and lives with daughters.    Outpatient Medications Prior to Visit  Medication Sig Dispense Refill  . acetaminophen (TYLENOL) 650 MG CR tablet Take 1,300 mg by mouth every 8 (eight) hours as needed for pain.    Marland Kitchen azelastine (OPTIVAR) 0.05 % ophthalmic solution Place 0.005 drops into both eyes at bedtime.  2  . dorzolamide  (TRUSOPT) 2 % ophthalmic solution Place 2 drops into both eyes at bedtime.  6  . latanoprost (XALATAN) 0.005 % ophthalmic solution Place 1 drop into both eyes as directed.   1  . nitroGLYCERIN (NITROLINGUAL) 0.4 MG/SPRAY spray Place 1 spray under the tongue every 5 (five) minutes x 3 doses as needed for chest pain.    . Rivaroxaban (XARELTO) 15 MG TABS tablet Take 1 tablet (15 mg total) by mouth daily with supper. 30 tablet 10  . timolol (TIMOPTIC) 0.5 % ophthalmic solution Place 1 drop into both eyes at bedtime.  6  . atorvastatin (LIPITOR) 40 MG tablet TAKE 1 TABLET BY MOUTH DAILY 30 tablet 0  . carvedilol (COREG) 3.125 MG tablet TAKE 2 TABLETS(6.25 MG) BY MOUTH TWICE DAILY 120 tablet 0  . oxybutynin (DITROPAN-XL) 10 MG 24 hr tablet TAKE 1 TABLET(10 MG) BY MOUTH AT BEDTIME 30 tablet 0  . triamterene-hydrochlorothiazide (MAXZIDE-25) 37.5-25 MG tablet TAKE 1 TABLET BY MOUTH DAILY 30 tablet 0   No facility-administered medications prior to visit.     Allergies  Allergen Reactions  . Alphagan [Brimonidine] Other (See Comments)    Secondary infection and vision changes    Review of Systems  Constitutional: Negative for fever and malaise/fatigue.  HENT: Negative for congestion.   Eyes: Negative for blurred vision.  Respiratory: Negative for shortness of breath.   Cardiovascular: Negative for chest pain, palpitations and leg swelling.  Gastrointestinal: Negative for abdominal pain, blood in stool and nausea.  Genitourinary: Negative for dysuria and frequency.  Musculoskeletal: Negative for falls.  Skin: Negative for rash.  Neurological: Negative for dizziness, loss of consciousness and headaches.  Endo/Heme/Allergies: Negative for environmental allergies.  Psychiatric/Behavioral: Negative for depression. The patient is not nervous/anxious.        Objective:    Physical Exam  Constitutional: She is oriented to person, place, and time. She appears well-developed and well-nourished. No  distress.  HENT:  Head: Normocephalic and atraumatic.  Nose: Nose normal.  Eyes: Right eye exhibits no discharge. Left eye exhibits no discharge.  Neck: Normal range of motion. Neck supple.  Cardiovascular: Normal rate and regular rhythm.  No murmur heard. Pulmonary/Chest: Effort normal and breath sounds normal.  Abdominal: Soft. Bowel sounds are normal. There is no tenderness.  Musculoskeletal: She exhibits no edema.  Neurological: She is alert and oriented to person, place, and time.  Skin: Skin is warm and dry.  Psychiatric: She has a normal mood and affect.  Nursing note and vitals reviewed.   BP 122/60 (BP Location: Left Arm, Patient Position: Sitting, Cuff Size: Normal)   Pulse (!) 59   Temp 97.9 F (36.6 C) (Oral)   Resp 18   Ht 5' (1.524 m)   Wt 148 lb 3.2 oz (67.2 kg)   SpO2 98%   BMI 28.94 kg/m  Wt Readings from Last 3 Encounters:  01/23/18 148 lb 3.2 oz (67.2 kg)  12/23/17 149 lb 12.8 oz (67.9 kg)  10/04/17 149 lb (67.6 kg)   BP Readings from Last 3 Encounters:  01/23/18 122/60  12/23/17 122/60  10/04/17 (!) 144/74     Immunization History  Administered Date(s) Administered  . Influenza,inj,Quad PF,6+ Mos 02/19/2014, 01/17/2015  . Influenza-Unspecified 02/17/2017  . Pneumococcal Conjugate-13 11/25/2015  . Pneumococcal Polysaccharide-23 05/16/2013    Health Maintenance  Topic Date Due  . TETANUS/TDAP  02/27/1944  . INFLUENZA VACCINE  12/15/2017  . DEXA SCAN  Completed  . PNA vac Low Risk Adult  Completed    Lab Results  Component Value Date   WBC 6.7 09/20/2017   HGB 11.6 (L) 09/20/2017   HCT 34.5 (L) 09/20/2017   PLT 230.0 09/20/2017   GLUCOSE 107 (H) 09/20/2017   CHOL 140 09/20/2017   TRIG 106.0 09/20/2017   HDL 72.10 09/20/2017   LDLCALC 47 09/20/2017   ALT 13 09/20/2017   AST 20 09/20/2017   NA 139 09/20/2017   K 4.2 09/20/2017   CL 100 09/20/2017   CREATININE 1.15 09/20/2017   BUN 23 09/20/2017   CO2 29 09/20/2017   TSH 1.35  09/20/2017   INR 1.13 04/25/2015   HGBA1C 5.7 09/20/2017    Lab Results  Component Value Date   TSH 1.35 09/20/2017   Lab Results  Component Value Date   WBC 6.7 09/20/2017   HGB 11.6 (L) 09/20/2017   HCT 34.5 (L) 09/20/2017   MCV 97.0 09/20/2017   PLT 230.0 09/20/2017   Lab Results  Component Value Date   NA 139 09/20/2017   K 4.2 09/20/2017   CO2 29 09/20/2017   GLUCOSE 107 (H) 09/20/2017   BUN 23 09/20/2017   CREATININE 1.15 09/20/2017   BILITOT 1.0 09/20/2017   ALKPHOS 57 09/20/2017   AST 20 09/20/2017   ALT 13 09/20/2017   PROT 7.4 09/20/2017   ALBUMIN 4.2 09/20/2017   CALCIUM 10.3 09/20/2017   ANIONGAP 9 04/18/2015   GFR 56.94 (L) 09/20/2017   Lab Results  Component Value Date   CHOL 140 09/20/2017   Lab Results  Component Value Date   HDL 72.10 09/20/2017   Lab Results  Component Value Date   LDLCALC 47 09/20/2017   Lab Results  Component Value Date   TRIG 106.0 09/20/2017   Lab Results  Component Value Date   CHOLHDL 2 09/20/2017   Lab Results  Component Value Date   HGBA1C 5.7 09/20/2017         Assessment & Plan:   Problem List Items Addressed This Visit    HTN (hypertension)    Well controlled, no changes to meds. Encouraged heart healthy diet such as the DASH diet and exercise as tolerated. Check urine for microalb      Relevant Medications   carvedilol (COREG) 3.125 MG tablet   triamterene-hydrochlorothiazide (MAXZIDE-25) 37.5-25 MG tablet   atorvastatin (  LIPITOR) 40 MG tablet   oxybutynin (DITROPAN-XL) 10 MG 24 hr tablet   Other Relevant Orders   CBC with Differential/Platelet   Comprehensive metabolic panel   TSH   Urine Microalbumin w/creat. ratio   Hyperlipidemia   Relevant Medications   carvedilol (COREG) 3.125 MG tablet   triamterene-hydrochlorothiazide (MAXZIDE-25) 37.5-25 MG tablet   atorvastatin (LIPITOR) 40 MG tablet   oxybutynin (DITROPAN-XL) 10 MG 24 hr tablet   Other Relevant Orders   Lipid panel    Anemia    Increase leafy greens, consider increased lean red meat and using cast iron cookware. Continue to monitor, report any concerns      Relevant Medications   oxybutynin (DITROPAN-XL) 10 MG 24 hr tablet   Other Relevant Orders   Ferritin   Renal insufficiency   Relevant Medications   oxybutynin (DITROPAN-XL) 10 MG 24 hr tablet   Overactive bladder   Relevant Medications   oxybutynin (DITROPAN-XL) 10 MG 24 hr tablet   Hyperglycemia    hgba1c acceptable, minimize simple carbs. Increase exercise as tolerated.       Relevant Orders   Hemoglobin A1c    Other Visit Diagnoses    Needs flu shot    -  Primary   Relevant Orders   Flu vaccine HIGH DOSE PF (Fluzone High dose)      I have changed Ailea Fincher's atorvastatin. I am also having her maintain her nitroGLYCERIN, acetaminophen, latanoprost, azelastine, dorzolamide, timolol, Rivaroxaban, carvedilol, triamterene-hydrochlorothiazide, and oxybutynin.  Meds ordered this encounter  Medications  . carvedilol (COREG) 3.125 MG tablet    Sig: TAKE 2 TABLETS(6.25 MG) BY MOUTH TWICE DAILY    Dispense:  120 tablet    Refill:  1  . triamterene-hydrochlorothiazide (MAXZIDE-25) 37.5-25 MG tablet    Sig: Take 1 tablet by mouth daily.    Dispense:  90 tablet    Refill:  1  . atorvastatin (LIPITOR) 40 MG tablet    Sig: Take 1 tablet (40 mg total) by mouth daily.    Dispense:  90 tablet    Refill:  1  . oxybutynin (DITROPAN-XL) 10 MG 24 hr tablet    Sig: TAKE 1 TABLET(10 MG) BY MOUTH AT BEDTIME    Dispense:  90 tablet    Refill:  1    CMA served as scribe during this visit. History, Physical and Plan performed by medical provider. Documentation and orders reviewed and attested to.  Penni Homans, MD

## 2018-01-23 NOTE — Assessment & Plan Note (Signed)
Increase leafy greens, consider increased lean red meat and using cast iron cookware. Continue to monitor, report any concerns 

## 2018-02-07 DIAGNOSIS — H401132 Primary open-angle glaucoma, bilateral, moderate stage: Secondary | ICD-10-CM | POA: Diagnosis not present

## 2018-03-21 ENCOUNTER — Other Ambulatory Visit: Payer: Self-pay | Admitting: Family Medicine

## 2018-03-21 DIAGNOSIS — E785 Hyperlipidemia, unspecified: Secondary | ICD-10-CM

## 2018-03-21 DIAGNOSIS — I10 Essential (primary) hypertension: Secondary | ICD-10-CM

## 2018-03-21 DIAGNOSIS — N3281 Overactive bladder: Secondary | ICD-10-CM

## 2018-03-21 DIAGNOSIS — D538 Other specified nutritional anemias: Secondary | ICD-10-CM

## 2018-03-21 DIAGNOSIS — N289 Disorder of kidney and ureter, unspecified: Secondary | ICD-10-CM

## 2018-04-10 ENCOUNTER — Encounter: Payer: Self-pay | Admitting: Podiatry

## 2018-04-10 ENCOUNTER — Ambulatory Visit (INDEPENDENT_AMBULATORY_CARE_PROVIDER_SITE_OTHER): Payer: Medicare Other | Admitting: Podiatry

## 2018-04-10 DIAGNOSIS — B351 Tinea unguium: Secondary | ICD-10-CM

## 2018-04-10 DIAGNOSIS — M79674 Pain in right toe(s): Secondary | ICD-10-CM

## 2018-04-10 DIAGNOSIS — Q828 Other specified congenital malformations of skin: Secondary | ICD-10-CM | POA: Diagnosis not present

## 2018-04-10 DIAGNOSIS — M79675 Pain in left toe(s): Secondary | ICD-10-CM

## 2018-04-11 NOTE — Progress Notes (Signed)
Subjective: 84 y.o. returns the office today for painful, elongated, thickened toenails which she cannot trim herself and calluses. Denies any redness or drainage around the nails/calluses. Denies any acute changes since last appointment and no new complaints today. Denies any systemic complaints such as fevers, chills, nausea, vomiting.   PCP: Holly Lukes, MD  Objective: NAD DP/PT pulses palpable, CRT less than 3 seconds Nails hypertrophic, dystrophic, elongated, brittle, discolored 10. There is tenderness overlying the nails 1-5 bilaterally. There is no surrounding erythema or drainage along the nail sites. Hyperkeratotic lesion left foot the metatarsal 5.  Upon debridement no underlying ulceration drainage no signs of infection No open lesions or pre-ulcerative lesions are identified. No pain with calf compression, swelling, warmth, erythema.  Assessment: Patient presents with symptomatic onychomycosis, porokeratosis  Plan: -Treatment options including alternatives, risks, complications were discussed -Nails sharply debrided 10 without complication/bleeding. -Hyperkeratotic lesion sharply debrided x1 without any complications or bleeding. -Discussed daily foot inspection. If there are any changes, to call the office immediately.  -Follow-up in 3 months or sooner if any problems are to arise. In the meantime, encouraged to call the office with any questions, concerns, changes symptoms.  Holly Nunez, DPM

## 2018-04-21 ENCOUNTER — Other Ambulatory Visit: Payer: Self-pay

## 2018-04-21 ENCOUNTER — Other Ambulatory Visit: Payer: Self-pay | Admitting: Family Medicine

## 2018-04-21 MED ORDER — CARVEDILOL 3.125 MG PO TABS: ORAL_TABLET | ORAL | 1 refills | Status: DC

## 2018-05-29 ENCOUNTER — Ambulatory Visit (INDEPENDENT_AMBULATORY_CARE_PROVIDER_SITE_OTHER): Payer: Medicare Other | Admitting: Family Medicine

## 2018-05-29 DIAGNOSIS — E785 Hyperlipidemia, unspecified: Secondary | ICD-10-CM

## 2018-05-29 DIAGNOSIS — T7840XD Allergy, unspecified, subsequent encounter: Secondary | ICD-10-CM | POA: Diagnosis not present

## 2018-05-29 DIAGNOSIS — R739 Hyperglycemia, unspecified: Secondary | ICD-10-CM

## 2018-05-29 DIAGNOSIS — I10 Essential (primary) hypertension: Secondary | ICD-10-CM

## 2018-05-29 DIAGNOSIS — D538 Other specified nutritional anemias: Secondary | ICD-10-CM | POA: Diagnosis not present

## 2018-05-29 DIAGNOSIS — N289 Disorder of kidney and ureter, unspecified: Secondary | ICD-10-CM

## 2018-05-29 DIAGNOSIS — N3281 Overactive bladder: Secondary | ICD-10-CM | POA: Diagnosis not present

## 2018-05-29 LAB — LIPID PANEL
CHOLESTEROL: 160 mg/dL (ref 0–200)
HDL: 79.3 mg/dL (ref >39.00)
LDL CALC: 63 mg/dL (ref 0–99)
NonHDL: 80.81
TRIGLYCERIDES: 90 mg/dL (ref 0.0–149.0)
Total CHOL/HDL Ratio: 2
VLDL: 18 mg/dL (ref 0.0–40.0)

## 2018-05-29 LAB — CBC
HEMATOCRIT: 36.9 % (ref 36.0–46.0)
HEMOGLOBIN: 12.3 g/dL (ref 12.0–15.0)
MCHC: 33.3 g/dL (ref 30.0–36.0)
MCV: 97.1 fl (ref 78.0–100.0)
PLATELETS: 186 10*3/uL (ref 150.0–400.0)
RBC: 3.8 Mil/uL — AB (ref 3.87–5.11)
RDW: 13.3 % (ref 11.5–15.5)
WBC: 5.1 10*3/uL (ref 4.0–10.5)

## 2018-05-29 LAB — COMPREHENSIVE METABOLIC PANEL
ALBUMIN: 4.1 g/dL (ref 3.5–5.2)
ALK PHOS: 63 U/L (ref 39–117)
ALT: 18 U/L (ref 0–35)
AST: 21 U/L (ref 0–37)
BUN: 20 mg/dL (ref 6–23)
CALCIUM: 10.4 mg/dL (ref 8.4–10.5)
CHLORIDE: 99 meq/L (ref 96–112)
CO2: 30 mEq/L (ref 19–32)
Creatinine, Ser: 1.26 mg/dL — ABNORMAL HIGH (ref 0.40–1.20)
GFR: 51.16 mL/min — ABNORMAL LOW (ref >60.00)
Glucose, Bld: 110 mg/dL — ABNORMAL HIGH (ref 70–99)
POTASSIUM: 4.1 meq/L (ref 3.5–5.1)
Sodium: 139 mEq/L (ref 135–145)
TOTAL PROTEIN: 7 g/dL (ref 6.0–8.3)
Total Bilirubin: 0.7 mg/dL (ref 0.2–1.2)

## 2018-05-29 LAB — TSH: TSH: 1.65 u[IU]/mL (ref 0.35–4.50)

## 2018-05-29 LAB — HEMOGLOBIN A1C: Hgb A1c MFr Bld: 5.9 % (ref 4.6–6.5)

## 2018-05-29 MED ORDER — OXYBUTYNIN CHLORIDE ER 10 MG PO TB24: ORAL_TABLET | ORAL | 1 refills | Status: DC

## 2018-05-29 NOTE — Patient Instructions (Addendum)
For congestion, nasal saline, Claritin/Loratadine up to twice daily and can add Mucinex/Guiafinasin twice daily  Hypertension Hypertension, commonly called high blood pressure, is when the force of blood pumping through the arteries is too strong. The arteries are the blood vessels that carry blood from the heart throughout the body. Hypertension forces the heart to work harder to pump blood and may cause arteries to become narrow or stiff. Having untreated or uncontrolled hypertension can cause heart attacks, strokes, kidney disease, and other problems. A blood pressure reading consists of a higher number over a lower number. Ideally, your blood pressure should be below 120/80. The first ("top") number is called the systolic pressure. It is a measure of the pressure in your arteries as your heart beats. The second ("bottom") number is called the diastolic pressure. It is a measure of the pressure in your arteries as the heart relaxes. What are the causes? The cause of this condition is not known. What increases the risk? Some risk factors for high blood pressure are under your control. Others are not. Factors you can change  Smoking.  Having type 2 diabetes mellitus, high cholesterol, or both.  Not getting enough exercise or physical activity.  Being overweight.  Having too much fat, sugar, calories, or salt (sodium) in your diet.  Drinking too much alcohol. Factors that are difficult or impossible to change  Having chronic kidney disease.  Having a family history of high blood pressure.  Age. Risk increases with age.  Race. You may be at higher risk if you are African-American.  Gender. Men are at higher risk than women before age 79. After age 36, women are at higher risk than men.  Having obstructive sleep apnea.  Stress. What are the signs or symptoms? Extremely high blood pressure (hypertensive crisis) may cause:  Headache.  Anxiety.  Shortness of  breath.  Nosebleed.  Nausea and vomiting.  Severe chest pain.  Jerky movements you cannot control (seizures). How is this diagnosed? This condition is diagnosed by measuring your blood pressure while you are seated, with your arm resting on a surface. The cuff of the blood pressure monitor will be placed directly against the skin of your upper arm at the level of your heart. It should be measured at least twice using the same arm. Certain conditions can cause a difference in blood pressure between your right and left arms. Certain factors can cause blood pressure readings to be lower or higher than normal (elevated) for a short period of time:  When your blood pressure is higher when you are in a health care provider's office than when you are at home, this is called white coat hypertension. Most people with this condition do not need medicines.  When your blood pressure is higher at home than when you are in a health care provider's office, this is called masked hypertension. Most people with this condition may need medicines to control blood pressure. If you have a high blood pressure reading during one visit or you have normal blood pressure with other risk factors:  You may be asked to return on a different day to have your blood pressure checked again.  You may be asked to monitor your blood pressure at home for 1 week or longer. If you are diagnosed with hypertension, you may have other blood or imaging tests to help your health care provider understand your overall risk for other conditions. How is this treated? This condition is treated by making healthy lifestyle changes,  such as eating healthy foods, exercising more, and reducing your alcohol intake. Your health care provider may prescribe medicine if lifestyle changes are not enough to get your blood pressure under control, and if:  Your systolic blood pressure is above 130.  Your diastolic blood pressure is above 80. Your  personal target blood pressure may vary depending on your medical conditions, your age, and other factors. Follow these instructions at home: Eating and drinking   Eat a diet that is high in fiber and potassium, and low in sodium, added sugar, and fat. An example eating plan is called the DASH (Dietary Approaches to Stop Hypertension) diet. To eat this way: ? Eat plenty of fresh fruits and vegetables. Try to fill half of your plate at each meal with fruits and vegetables. ? Eat whole grains, such as whole wheat pasta, brown rice, or whole grain bread. Fill about one quarter of your plate with whole grains. ? Eat or drink low-fat dairy products, such as skim milk or low-fat yogurt. ? Avoid fatty cuts of meat, processed or cured meats, and poultry with skin. Fill about one quarter of your plate with lean proteins, such as fish, chicken without skin, beans, eggs, and tofu. ? Avoid premade and processed foods. These tend to be higher in sodium, added sugar, and fat.  Reduce your daily sodium intake. Most people with hypertension should eat less than 1,500 mg of sodium a day.  Limit alcohol intake to no more than 1 drink a day for nonpregnant women and 2 drinks a day for men. One drink equals 12 oz of beer, 5 oz of wine, or 1 oz of hard liquor. Lifestyle   Work with your health care provider to maintain a healthy body weight or to lose weight. Ask what an ideal weight is for you.  Get at least 30 minutes of exercise that causes your heart to beat faster (aerobic exercise) most days of the week. Activities may include walking, swimming, or biking.  Include exercise to strengthen your muscles (resistance exercise), such as pilates or lifting weights, as part of your weekly exercise routine. Try to do these types of exercises for 30 minutes at least 3 days a week.  Do not use any products that contain nicotine or tobacco, such as cigarettes and e-cigarettes. If you need help quitting, ask your  health care provider.  Monitor your blood pressure at home as told by your health care provider.  Keep all follow-up visits as told by your health care provider. This is important. Medicines  Take over-the-counter and prescription medicines only as told by your health care provider. Follow directions carefully. Blood pressure medicines must be taken as prescribed.  Do not skip doses of blood pressure medicine. Doing this puts you at risk for problems and can make the medicine less effective.  Ask your health care provider about side effects or reactions to medicines that you should watch for. Contact a health care provider if:  You think you are having a reaction to a medicine you are taking.  You have headaches that keep coming back (recurring).  You feel dizzy.  You have swelling in your ankles.  You have trouble with your vision. Get help right away if:  You develop a severe headache or confusion.  You have unusual weakness or numbness.  You feel faint.  You have severe pain in your chest or abdomen.  You vomit repeatedly.  You have trouble breathing. Summary  Hypertension is when the force  of blood pumping through your arteries is too strong. If this condition is not controlled, it may put you at risk for serious complications.  Your personal target blood pressure may vary depending on your medical conditions, your age, and other factors. For most people, a normal blood pressure is less than 120/80.  Hypertension is treated with lifestyle changes, medicines, or a combination of both. Lifestyle changes include weight loss, eating a healthy, low-sodium diet, exercising more, and limiting alcohol. This information is not intended to replace advice given to you by your health care provider. Make sure you discuss any questions you have with your health care provider. Document Released: 05/03/2005 Document Revised: 03/31/2016 Document Reviewed: 03/31/2016 Elsevier  Interactive Patient Education  2019 [employer].

## 2018-05-29 NOTE — Assessment & Plan Note (Signed)
Well controlled, no changes to meds. Encouraged heart healthy diet such as the DASH diet and exercise as tolerated.  °

## 2018-05-29 NOTE — Assessment & Plan Note (Signed)
hgba1c acceptable, minimize simple carbs. Increase exercise as tolerated.  

## 2018-05-29 NOTE — Assessment & Plan Note (Signed)
Encouraged heart healthy diet, increase exercise, avoid trans fats, consider a krill oil cap daily 

## 2018-05-29 NOTE — Assessment & Plan Note (Signed)
Responds to benadryl and saline. Consider Loratadine, Mcuinex twice

## 2018-05-31 NOTE — Progress Notes (Signed)
Subjective:    Patient ID: Holly Nunez, female    DOB: 07-10-1924, 85 y.o.   MRN: [id number]  No chief complaint on file.   HPI Patient is in today for follow up. She feels well.no recent febrile illness or hospitalizations. Has noted some increase in head congestion with some post nasal drip. No fevers or chills. No other acute concerns. Denies CP/palp/SOB/HA/fevers/GI or GU c/o. Taking meds as prescribed  Past Medical History:  Diagnosis Date  . Arthritis   . Estrogen deficiency 05/27/2016  . Great toe pain, left 12/02/2016  . H/O measles   . H/O mumps   . History of chicken pox   . Hyperglycemia 05/27/2016  . Hyperlipidemia   . Hypertension   . Patent foramen ovale   . Preventative health care 02/26/2015  . Renal insufficiency   . Thyroid nodule    Biopsy-negative  . TIA (transient ischemic attack)   . Urinary incontinence     Past Surgical History:  Procedure Laterality Date  . ABDOMINAL HYSTERECTOMY     fibroid  . APPENDECTOMY    . EYE SURGERY Bilateral    CATARACT REMOVAL  . JOINT REPLACEMENT    . REPLACEMENT TOTAL KNEE Right 2007  . THYROID LOBECTOMY Left 04/25/2015  . THYROID LOBECTOMY Left 04/25/2015   Procedure: LEFT THYROID LOBECTOMY;  Surgeon: Armandina Gemma, MD;  Location: [facility];  Service: General;  Laterality: Left;  . TRIGGER FINGER RELEASE  2007    Family History  Problem Relation Age of Onset  . Heart disease Mother   . Hypertension Mother   . Kidney disease Sister   . Mental illness Sister        suicide, depresion  . Gout Brother   . Hypertension Son   . Cancer Maternal Grandmother   . Cancer Sister   . Bone cancer Sister   . Cancer Sister   . Cancer Sister        kidney  . Cancer Daughter   . Pancreatic cancer Daughter   . Thyroid disease Daughter   . Thyroid disease Daughter        thyroid nodules  . Hypertension Daughter   . Irritable bowel syndrome Daughter   . Cancer Daughter        thyroid  . Cancer Brother   . Prostate  cancer Brother     Social History   Socioeconomic History  . Marital status: Widowed    Spouse name: Not on file  . Number of children: 4  . Years of education: Not on file  . Highest education level: Not on file  Occupational History  . Not on file  Social Needs  . Financial resource strain: Not on file  . Food insecurity:    Worry: Not on file    Inability: Not on file  . Transportation needs:    Medical: Not on file    Non-medical: Not on file  Tobacco Use  . Smoking status: Never Smoker  . Smokeless tobacco: Never Used  Substance and Sexual Activity  . Alcohol use: No  . Drug use: No  . Sexual activity: Not Currently    Partners: Male    Comment: no dietary restrictions, lives with 2 daughters  Lifestyle  . Physical activity:    Days per week: Not on file    Minutes per session: Not on file  . Stress: Not on file  Relationships  . Social connections:    Talks on phone: Not on file  Gets together: Not on file    Attends religious service: Not on file    Active member of club or organization: Not on file    Attends meetings of clubs or organizations: Not on file    Relationship status: Not on file  . Intimate partner violence:    Fear of current or ex partner: Not on file    Emotionally abused: Not on file    Physically abused: Not on file    Forced sexual activity: Not on file  Other Topics Concern  . Not on file  Social History Narrative   No dietary restrictions and lives with daughters.    Outpatient Medications Prior to Visit  Medication Sig Dispense Refill  . acetaminophen (TYLENOL) 650 MG CR tablet Take 1,300 mg by mouth every 8 (eight) hours as needed for pain.    Marland Kitchen atorvastatin (LIPITOR) 40 MG tablet Take 1 tablet (40 mg total) by mouth daily. 90 tablet 1  . azelastine (OPTIVAR) 0.05 % ophthalmic solution Place 0.005 drops into both eyes at bedtime.  2  . carvedilol (COREG) 3.125 MG tablet TAKE 2 TABLETS(6.25 MG) BY MOUTH TWICE DAILY 360 tablet  1  . dorzolamide (TRUSOPT) 2 % ophthalmic solution Place 2 drops into both eyes at bedtime.  6  . latanoprost (XALATAN) 0.005 % ophthalmic solution Place 1 drop into both eyes as directed.   1  . nitroGLYCERIN (NITROLINGUAL) 0.4 MG/SPRAY spray Place 1 spray under the tongue every 5 (five) minutes x 3 doses as needed for chest pain.    . Rivaroxaban (XARELTO) 15 MG TABS tablet Take 1 tablet (15 mg total) by mouth daily with supper. 30 tablet 10  . timolol (TIMOPTIC) 0.5 % ophthalmic solution Place 1 drop into both eyes at bedtime.  6  . triamterene-hydrochlorothiazide (MAXZIDE-25) 37.5-25 MG tablet Take 1 tablet by mouth daily. 90 tablet 1  . oxybutynin (DITROPAN-XL) 10 MG 24 hr tablet TAKE 1 TABLET(10 MG) BY MOUTH AT BEDTIME 90 tablet 1  . oxybutynin (DITROPAN-XL) 10 MG 24 hr tablet TAKE 1 TABLET(10 MG) BY MOUTH AT BEDTIME 30 tablet 3   No facility-administered medications prior to visit.     Allergies  Allergen Reactions  . Alphagan [Brimonidine] Other (See Comments)    Secondary infection and vision changes    Review of Systems  Constitutional: Negative for fever and malaise/fatigue.  HENT: Positive for congestion.   Eyes: Negative for blurred vision.  Respiratory: Positive for sputum production. Negative for shortness of breath.   Cardiovascular: Negative for chest pain, palpitations and leg swelling.  Gastrointestinal: Negative for abdominal pain, blood in stool and nausea.  Genitourinary: Negative for dysuria and frequency.  Musculoskeletal: Negative for falls.  Skin: Negative for rash.  Neurological: Negative for dizziness, loss of consciousness and headaches.  Endo/Heme/Allergies: Negative for environmental allergies.  Psychiatric/Behavioral: Negative for depression. The patient is not nervous/anxious.        Objective:    Physical Exam Vitals signs and nursing note reviewed.  Constitutional:      General: She is not in acute distress.    Appearance: She is  well-developed.  HENT:     Head: Normocephalic and atraumatic.     Nose: Nose normal.  Eyes:     General:        Right eye: No discharge.        Left eye: No discharge.  Neck:     Musculoskeletal: Normal range of motion and neck supple.  Cardiovascular:  Rate and Rhythm: Normal rate and regular rhythm.     Heart sounds: No murmur.  Pulmonary:     Effort: Pulmonary effort is normal.     Breath sounds: Normal breath sounds.  Abdominal:     General: Bowel sounds are normal.     Palpations: Abdomen is soft.     Tenderness: There is no abdominal tenderness.  Skin:    General: Skin is warm and dry.  Neurological:     Mental Status: She is alert and oriented to person, place, and time.     BP 130/60 (BP Location: Left Arm, Patient Position: Sitting, Cuff Size: Normal)   Pulse 61   Temp 97.6 F (36.4 C) (Oral)   Resp 18   Wt 145 lb (65.8 kg)   SpO2 98%   BMI 28.32 kg/m  Wt Readings from Last 3 Encounters:  05/29/18 145 lb (65.8 kg)  01/23/18 148 lb 3.2 oz (67.2 kg)  12/23/17 149 lb 12.8 oz (67.9 kg)     Lab Results  Component Value Date   WBC 5.1 05/29/2018   HGB 12.3 05/29/2018   HCT 36.9 05/29/2018   PLT 186.0 05/29/2018   GLUCOSE 110 (H) 05/29/2018   CHOL 160 05/29/2018   TRIG 90.0 05/29/2018   HDL 79.30 05/29/2018   LDLCALC 63 05/29/2018   ALT 18 05/29/2018   AST 21 05/29/2018   NA 139 05/29/2018   K 4.1 05/29/2018   CL 99 05/29/2018   CREATININE 1.26 (H) 05/29/2018   BUN 20 05/29/2018   CO2 30 05/29/2018   TSH 1.65 05/29/2018   INR 1.13 04/25/2015   HGBA1C 5.9 05/29/2018   MICROALBUR 2.2 (H) 01/23/2018    Lab Results  Component Value Date   TSH 1.65 05/29/2018   Lab Results  Component Value Date   WBC 5.1 05/29/2018   HGB 12.3 05/29/2018   HCT 36.9 05/29/2018   MCV 97.1 05/29/2018   PLT 186.0 05/29/2018   Lab Results  Component Value Date   NA 139 05/29/2018   K 4.1 05/29/2018   CO2 30 05/29/2018   GLUCOSE 110 (H) 05/29/2018   BUN  20 05/29/2018   CREATININE 1.26 (H) 05/29/2018   BILITOT 0.7 05/29/2018   ALKPHOS 63 05/29/2018   AST 21 05/29/2018   ALT 18 05/29/2018   PROT 7.0 05/29/2018   ALBUMIN 4.1 05/29/2018   CALCIUM 10.4 05/29/2018   ANIONGAP 9 04/18/2015   GFR 51.16 (L) 05/29/2018   Lab Results  Component Value Date   CHOL 160 05/29/2018   Lab Results  Component Value Date   HDL 79.30 05/29/2018   Lab Results  Component Value Date   LDLCALC 63 05/29/2018   Lab Results  Component Value Date   TRIG 90.0 05/29/2018   Lab Results  Component Value Date   CHOLHDL 2 05/29/2018   Lab Results  Component Value Date   HGBA1C 5.9 05/29/2018       Assessment & Plan:   Problem List Items Addressed This Visit    HTN (hypertension)    Well controlled, no changes to meds. Encouraged heart healthy diet such as the DASH diet and exercise as tolerated.       Relevant Orders   CBC (Completed)   Comprehensive metabolic panel (Completed)   TSH (Completed)   Hyperlipidemia    Encouraged heart healthy diet, increase exercise, avoid trans fats, consider a krill oil cap daily      Relevant Orders   Lipid panel (Completed)  Anemia   Renal insufficiency   Overactive bladder   Relevant Medications   oxybutynin (DITROPAN-XL) 10 MG 24 hr tablet   Allergy    Responds to benadryl and saline. Consider Loratadine, Mcuinex twice       Hyperglycemia    hgba1c acceptable, minimize simple carbs. Increase exercise as tolerated.       Relevant Orders   Hemoglobin A1c (Completed)      I am having Holly Nunez maintain her nitroGLYCERIN, acetaminophen, latanoprost, azelastine, dorzolamide, timolol, Rivaroxaban, triamterene-hydrochlorothiazide, atorvastatin, carvedilol, and oxybutynin.  Meds ordered this encounter  Medications  . oxybutynin (DITROPAN-XL) 10 MG 24 hr tablet    Sig: TAKE 1 TABLET(10 MG) BY MOUTH AT BEDTIME    Dispense:  90 tablet    Refill:  1     Penni Homans, MD

## 2018-07-11 ENCOUNTER — Encounter: Payer: Self-pay | Admitting: Podiatry

## 2018-07-11 ENCOUNTER — Ambulatory Visit (INDEPENDENT_AMBULATORY_CARE_PROVIDER_SITE_OTHER): Payer: Medicare Other | Admitting: Podiatry

## 2018-07-11 DIAGNOSIS — B351 Tinea unguium: Secondary | ICD-10-CM | POA: Diagnosis not present

## 2018-07-11 DIAGNOSIS — M79674 Pain in right toe(s): Secondary | ICD-10-CM

## 2018-07-11 DIAGNOSIS — M79675 Pain in left toe(s): Secondary | ICD-10-CM | POA: Diagnosis not present

## 2018-07-11 DIAGNOSIS — Q828 Other specified congenital malformations of skin: Secondary | ICD-10-CM

## 2018-07-11 NOTE — Progress Notes (Signed)
Subjective: 85 y.o. returns the office today for painful, elongated, thickened toenails which she cannot trim herself and calluses. Denies any redness or drainage around the nails/calluses. Denies any acute changes since last appointment and no new complaints today. Denies any systemic complaints such as fevers, chills, nausea, vomiting.   PCP: Mosie Lukes, MD  Objective: NAD-presents with her son DP/PT pulses palpable, CRT less than 3 seconds Sensation appears to be intact with Thornell Mule monofilament Nails hypertrophic, dystrophic, elongated, brittle, discolored 10. There is tenderness overlying the nails 1-5 bilaterally. There is no surrounding erythema or drainage along the nail sites. Hyperkeratotic lesion left foot the metatarsal 5.  Upon debridement no underlying ulceration drainage no signs of infection No open lesions or pre-ulcerative lesions are identified. No pain with calf compression, swelling, warmth, erythema.  Assessment: Symptomatic onychomycosis, porokeratosis  Plan: -Treatment options including alternatives, risks, complications were discussed -Nails sharply debrided 10 without complication/bleeding. -Hyperkeratotic lesion sharply debrided x1 without any complications or bleeding. -Discussed daily foot inspection. If there are any changes, to call the office immediately.  -Follow-up in 3 months or sooner if any problems are to arise. In the meantime, encouraged to call the office with any questions, concerns, changes symptoms.  Celesta Gentile, DPM

## 2018-08-18 ENCOUNTER — Telehealth: Payer: Self-pay

## 2018-08-18 ENCOUNTER — Other Ambulatory Visit: Payer: Self-pay | Admitting: Family Medicine

## 2018-08-18 NOTE — Telephone Encounter (Signed)
PA initiated via Covermymeds; KEY: [id number]. Awaiting determination.

## 2018-08-18 NOTE — Telephone Encounter (Signed)
Opened in error

## 2018-08-18 NOTE — Telephone Encounter (Signed)
PA approved.   Effective from 08/18/2018 through 08/18/2019

## 2018-09-01 ENCOUNTER — Ambulatory Visit: Payer: Medicare Other | Admitting: Family Medicine

## 2018-09-04 ENCOUNTER — Ambulatory Visit: Payer: Medicare Other | Admitting: Family Medicine

## 2018-09-19 NOTE — Progress Notes (Signed)
Virtual Visit via Video Note changed to telephone visit as patient did not have a smart phone   This visit type was conducted due to national recommendations for restrictions regarding the COVID-19 Pandemic (e.g. social distancing) in an effort to limit this patient's exposure and mitigate transmission in our community.  Due to her co-morbid illnesses, this patient is at least at moderate risk for complications without adequate follow up.  This format is felt to be most appropriate for this patient at this time.  All issues noted in this document were discussed and addressed.  A limited physical exam was performed with this format.  Please refer to the patient's chart for her consent to telehealth for [facility].   Date:  09/20/2018   ID:  Holly Nunez, DOB 22-Mar-1925, MRN [id number]  Patient Location: Home Provider Location: Home  PCP:  Mosie Lukes, MD  Cardiologist:  Dr Stanford Breed  Evaluation Performed:  Follow-Up Visit  Chief Complaint:  FU hypertension  History of Present Illness:    FU hypertension, patent foramen ovale and prior TIA. Patient previously lived in [place]. Echocardiogram in November 2014 showed normal LV function, mild to moderate left ventricular hypertrophy, grade 2 diastolic dysfunction, aneurysmal atrial septum with PFO. There was moderate atherosclerosis in the aortic arch. She has a history of TIAs; Initially treated with Plavix and aspirin but recurrence and now on xarelto. CT 9/16 showed thyromegaly and coronary calcification. Nuclear study 10/16 showed EF 75 with normal perfusion. Since last seen,  patient denies dyspnea, chest pain, palpitations, syncope or bleeding.  The patient does not have symptoms concerning for COVID-19 infection (fever, chills, cough, or new shortness of breath).    Past Medical History:  Diagnosis Date  . Arthritis   . Estrogen deficiency 05/27/2016  . Great toe pain, left 12/02/2016  . H/O measles   . H/O mumps   .  History of chicken pox   . Hyperglycemia 05/27/2016  . Hyperlipidemia   . Hypertension   . Patent foramen ovale   . Preventative health care 02/26/2015  . Renal insufficiency   . Thyroid nodule    Biopsy-negative  . TIA (transient ischemic attack)   . Urinary incontinence    Past Surgical History:  Procedure Laterality Date  . ABDOMINAL HYSTERECTOMY     fibroid  . APPENDECTOMY    . EYE SURGERY Bilateral    CATARACT REMOVAL  . JOINT REPLACEMENT    . REPLACEMENT TOTAL KNEE Right 2007  . THYROID LOBECTOMY Left 04/25/2015  . THYROID LOBECTOMY Left 04/25/2015   Procedure: LEFT THYROID LOBECTOMY;  Surgeon: Armandina Gemma, MD;  Location: [place];  Service: General;  Laterality: Left;  . TRIGGER FINGER RELEASE  2007     Current Meds  Medication Sig  . acetaminophen (TYLENOL) 650 MG CR tablet Take 1,300 mg by mouth every 8 (eight) hours as needed for pain.  Marland Kitchen atorvastatin (LIPITOR) 40 MG tablet Take 1 tablet (40 mg total) by mouth daily.  Marland Kitchen azelastine (OPTIVAR) 0.05 % ophthalmic solution Place 0.005 drops into both eyes at bedtime.  . carvedilol (COREG) 3.125 MG tablet TAKE 2 TABLETS(6.25 MG) BY MOUTH TWICE DAILY  . dorzolamide (TRUSOPT) 2 % ophthalmic solution Place 2 drops into both eyes at bedtime.  Marland Kitchen latanoprost (XALATAN) 0.005 % ophthalmic solution Place 1 drop into both eyes as directed.   . nitroGLYCERIN (NITROLINGUAL) 0.4 MG/SPRAY spray Place 1 spray under the tongue every 5 (five) minutes x 3 doses as needed for chest pain.  Marland Kitchen  oxybutynin (DITROPAN-XL) 10 MG 24 hr tablet TAKE 1 TABLET(10 MG) BY MOUTH AT BEDTIME  . Rivaroxaban (XARELTO) 15 MG TABS tablet Take 1 tablet (15 mg total) by mouth daily with supper.  . timolol (TIMOPTIC) 0.5 % ophthalmic solution Place 1 drop into both eyes at bedtime.  . triamterene-hydrochlorothiazide (MAXZIDE-25) 37.5-25 MG tablet TAKE 1 TABLET BY MOUTH DAILY     Allergies:   Alphagan [brimonidine]   Social History   Tobacco Use  . Smoking status:  Never Smoker  . Smokeless tobacco: Never Used  Substance Use Topics  . Alcohol use: No  . Drug use: No     Family Hx: The patient's family history includes Bone cancer in her sister; Cancer in her brother, daughter, daughter, maternal grandmother, sister, sister, and sister; Gout in her brother; Heart disease in her mother; Hypertension in her daughter, mother, and son; Irritable bowel syndrome in her daughter; Kidney disease in her sister; Mental illness in her sister; Pancreatic cancer in her daughter; Prostate cancer in her brother; Thyroid disease in her daughter and daughter.  ROS:   Please see the history of present illness.    No fevers, chills or productive cough. All other systems reviewed and are negative.  Recent Labs: 05/29/2018: ALT 18; BUN 20; Creatinine, Ser 1.26; Hemoglobin 12.3; Platelets 186.0; Potassium 4.1; Sodium 139; TSH 1.65   Recent Lipid Panel Lab Results  Component Value Date/Time   CHOL 160 05/29/2018 02:06 PM   TRIG 90.0 05/29/2018 02:06 PM   HDL 79.30 05/29/2018 02:06 PM   CHOLHDL 2 05/29/2018 02:06 PM   LDLCALC 63 05/29/2018 02:06 PM    Wt Readings from Last 3 Encounters:  09/20/18 141 lb (64 kg)  05/29/18 145 lb (65.8 kg)  01/23/18 148 lb 3.2 oz (67.2 kg)     Objective:    Vital Signs:  Ht 5' (1.524 m)   Wt 141 lb (64 kg)   BMI 27.54 kg/m    VITAL SIGNS:  reviewed  No acute distress  normal affect Answers questions appropriately Remainder of physical exam not performed (telehealth visit; coronavirus pandemic)  ASSESSMENT & PLAN:    1. Hypertension-continue present meds and follow. 2. Hyperlipidemia-continue statin. 3. History of TIA-as outlined in previous notes patient had TIAs in the setting of patent foramen ovale and atrial septal aneurysm which was documented at an outside hospital.  She had recurrent episodes despite aspirin and Plavix and has therefore been anticoagulated with Xarelto.  We will continue at present dose.  Check  hemoglobin and renal function.  COVID-19 Education: The importance of social distancing was discussed today.  Time:   Today, I have spent 15 minutes with the patient with telehealth technology discussing the above problems.     Medication Adjustments/Labs and Tests Ordered: Current medicines are reviewed at length with the patient today.  Concerns regarding medicines are outlined above.   Tests Ordered: No orders of the defined types were placed in this encounter.   Medication Changes: No orders of the defined types were placed in this encounter.   Disposition:  Follow up in 6 month(s)  Signed, Kirk Ruths, MD  09/20/2018 2:52 PM    [facility]

## 2018-09-20 ENCOUNTER — Telehealth (INDEPENDENT_AMBULATORY_CARE_PROVIDER_SITE_OTHER): Payer: Medicare Other | Admitting: Cardiology

## 2018-09-20 VITALS — Ht 60.0 in | Wt 141.0 lb

## 2018-09-20 DIAGNOSIS — Q2112 Patent foramen ovale: Secondary | ICD-10-CM

## 2018-09-20 DIAGNOSIS — I10 Essential (primary) hypertension: Secondary | ICD-10-CM | POA: Diagnosis not present

## 2018-09-20 DIAGNOSIS — E7800 Pure hypercholesterolemia, unspecified: Secondary | ICD-10-CM

## 2018-09-20 DIAGNOSIS — Q211 Atrial septal defect: Secondary | ICD-10-CM

## 2018-09-20 DIAGNOSIS — G459 Transient cerebral ischemic attack, unspecified: Secondary | ICD-10-CM

## 2018-09-20 MED ORDER — RIVAROXABAN 15 MG PO TABS: 15.0000 mg | ORAL_TABLET | Freq: Every day | ORAL | 11 refills | Status: DC

## 2018-09-20 NOTE — Patient Instructions (Signed)
Medication Instructions:  Refill sent to the pharmacy electronically.  If you need a refill on your cardiac medications before your next appointment, please call your pharmacy.   Lab work: If you have labs (blood work) drawn today and your tests are completely normal, you will receive your results only by: Marland Kitchen MyChart Message (if you have MyChart) OR . A paper copy in the mail If you have any lab test that is abnormal or we need to change your treatment, we will call you to review the results.  Follow-Up: At [facility], you and your health needs are our priority.  As part of our continuing mission to provide you with exceptional heart care, we have created designated Provider Care Teams.  These Care Teams include your primary Cardiologist (physician) and Advanced Practice Providers (APPs -  Physician Assistants and Nurse Practitioners) who all work together to provide you with the care you need, when you need it. You will need a follow up appointment in 6 months.  Please call our office 2 months in advance to schedule this appointment.  You may see Kirk Ruths MD  or one of the following Advanced Practice Providers on your designated Care Team:   Kerin Ransom, PA-C Roby Lofts, [place] . Sande Rives, PA-C

## 2018-10-10 ENCOUNTER — Ambulatory Visit: Payer: Medicare Other | Admitting: Podiatry

## 2018-10-17 ENCOUNTER — Other Ambulatory Visit: Payer: Self-pay | Admitting: Family Medicine

## 2018-11-27 ENCOUNTER — Ambulatory Visit: Payer: Medicare Other | Admitting: Podiatry

## 2019-01-26 ENCOUNTER — Other Ambulatory Visit: Payer: Self-pay | Admitting: Family Medicine

## 2019-01-26 DIAGNOSIS — N3281 Overactive bladder: Secondary | ICD-10-CM

## 2019-02-06 ENCOUNTER — Emergency Department (HOSPITAL_BASED_OUTPATIENT_CLINIC_OR_DEPARTMENT_OTHER): Payer: Medicare Other

## 2019-02-06 ENCOUNTER — Other Ambulatory Visit: Payer: Self-pay

## 2019-02-06 ENCOUNTER — Encounter (HOSPITAL_BASED_OUTPATIENT_CLINIC_OR_DEPARTMENT_OTHER): Payer: Self-pay | Admitting: Adult Health

## 2019-02-06 ENCOUNTER — Emergency Department (HOSPITAL_BASED_OUTPATIENT_CLINIC_OR_DEPARTMENT_OTHER)
Admission: EM | Admit: 2019-02-06 | Discharge: 2019-02-06 | Disposition: A | Discharge status: Home / Self Care | Payer: Medicare Other | Attending: Emergency Medicine | Admitting: Emergency Medicine

## 2019-02-06 DIAGNOSIS — I10 Essential (primary) hypertension: Secondary | ICD-10-CM | POA: Insufficient documentation

## 2019-02-06 DIAGNOSIS — R531 Weakness: Secondary | ICD-10-CM | POA: Diagnosis not present

## 2019-02-06 DIAGNOSIS — R110 Nausea: Secondary | ICD-10-CM | POA: Diagnosis not present

## 2019-02-06 DIAGNOSIS — N289 Disorder of kidney and ureter, unspecified: Secondary | ICD-10-CM | POA: Insufficient documentation

## 2019-02-06 DIAGNOSIS — R410 Disorientation, unspecified: Secondary | ICD-10-CM | POA: Diagnosis present

## 2019-02-06 DIAGNOSIS — Z7901 Long term (current) use of anticoagulants: Secondary | ICD-10-CM | POA: Insufficient documentation

## 2019-02-06 DIAGNOSIS — Z96651 Presence of right artificial knee joint: Secondary | ICD-10-CM | POA: Diagnosis not present

## 2019-02-06 DIAGNOSIS — Z79899 Other long term (current) drug therapy: Secondary | ICD-10-CM | POA: Diagnosis not present

## 2019-02-06 DIAGNOSIS — N390 Urinary tract infection, site not specified: Secondary | ICD-10-CM | POA: Diagnosis not present

## 2019-02-06 DIAGNOSIS — G459 Transient cerebral ischemic attack, unspecified: Secondary | ICD-10-CM | POA: Diagnosis not present

## 2019-02-06 LAB — CBC WITH DIFFERENTIAL/PLATELET
Abs Immature Granulocytes: 0.03 10*3/uL (ref 0.00–0.07)
Basophils Absolute: 0 10*3/uL (ref 0.0–0.1)
Basophils Relative: 0 %
Eosinophils Absolute: 0 10*3/uL (ref 0.0–0.5)
Eosinophils Relative: 0 %
HCT: 34.2 % — ABNORMAL LOW (ref 36.0–46.0)
Hemoglobin: 11.2 g/dL — ABNORMAL LOW (ref 12.0–15.0)
Immature Granulocytes: 0 %
Lymphocytes Relative: 8 %
Lymphs Abs: 0.6 10*3/uL — ABNORMAL LOW (ref 0.7–4.0)
MCH: 31.3 pg (ref 26.0–34.0)
MCHC: 32.7 g/dL (ref 30.0–36.0)
MCV: 95.5 fL (ref 80.0–100.0)
Monocytes Absolute: 0.3 10*3/uL (ref 0.1–1.0)
Monocytes Relative: 4 %
Neutro Abs: 7.1 10*3/uL (ref 1.7–7.7)
Neutrophils Relative %: 88 %
Platelets: 166 10*3/uL (ref 150–400)
RBC: 3.58 MIL/uL — ABNORMAL LOW (ref 3.87–5.11)
RDW: 13 % (ref 11.5–15.5)
WBC: 8 10*3/uL (ref 4.0–10.5)
nRBC: 0 % (ref 0.0–0.2)

## 2019-02-06 LAB — COMPREHENSIVE METABOLIC PANEL
ALT: 11 U/L (ref 0–44)
AST: 29 U/L (ref 15–41)
Albumin: 3.7 g/dL (ref 3.5–5.0)
Alkaline Phosphatase: 49 U/L (ref 38–126)
Anion gap: 13 (ref 5–15)
BUN: 28 mg/dL — ABNORMAL HIGH (ref 8–23)
CO2: 25 mmol/L (ref 22–32)
Calcium: 10.1 mg/dL (ref 8.9–10.3)
Chloride: 98 mmol/L (ref 98–111)
Creatinine, Ser: 1.56 mg/dL — ABNORMAL HIGH (ref 0.44–1.00)
GFR calc Af Amer: 33 mL/min — ABNORMAL LOW (ref >60)
GFR calc non Af Amer: 29 mL/min — ABNORMAL LOW (ref >60)
Glucose, Bld: 142 mg/dL — ABNORMAL HIGH (ref 70–99)
Potassium: 4.3 mmol/L (ref 3.5–5.1)
Sodium: 136 mmol/L (ref 135–145)
Total Bilirubin: 1 mg/dL (ref 0.3–1.2)
Total Protein: 7.1 g/dL (ref 6.5–8.1)

## 2019-02-06 LAB — URINALYSIS, MICROSCOPIC (REFLEX)

## 2019-02-06 LAB — URINALYSIS, ROUTINE W REFLEX MICROSCOPIC
Bilirubin Urine: NEGATIVE
Glucose, UA: NEGATIVE mg/dL
Hgb urine dipstick: NEGATIVE
Ketones, ur: NEGATIVE mg/dL
Nitrite: POSITIVE — AB
Protein, ur: NEGATIVE mg/dL
Specific Gravity, Urine: 1.01 (ref 1.005–1.030)
pH: 5.5 (ref 5.0–8.0)

## 2019-02-06 MED ORDER — SODIUM CHLORIDE 0.9 % IV SOLN: INTRAVENOUS | Status: DC

## 2019-02-06 MED ORDER — CEPHALEXIN 250 MG PO CAPS
500.0000 mg | ORAL_CAPSULE | Freq: Once | ORAL | Status: AC
  Administered 2019-02-06: 500 mg via ORAL
  Filled 2019-02-06: qty 2

## 2019-02-06 MED ORDER — SODIUM CHLORIDE 0.9 % IV BOLUS: 250.0000 mL | Freq: Once | INTRAVENOUS | Status: DC

## 2019-02-06 MED ORDER — CEPHALEXIN 500 MG PO CAPS: 500.0000 mg | ORAL_CAPSULE | Freq: Four times a day (QID) | ORAL | 0 refills | Status: DC

## 2019-02-06 NOTE — ED Notes (Signed)
Patient transported to CT 

## 2019-02-06 NOTE — Discharge Instructions (Signed)
Take the antibiotic Keflex as directed for the next 7 days.  Symptoms seem to be consistent with a urinary tract infection.  Urine sent for culture.  Would expect improvement over the next 2 days.  Would not expect you to get worse if you get worse you need to get seen.  Make an appointment to follow-up with your primary care doctor to have your kidney function rechecked in about 2 weeks.  Also they can recheck to make sure that the urine is now clear.  Your kidney function is a little bit worse than it was in January.

## 2019-02-06 NOTE — ED Notes (Signed)
Attempt at PIV unsuccessful, to be attempted with u/s guidance

## 2019-02-06 NOTE — ED Triage Notes (Signed)
PT reports that she woke up at 3:30 Am this morning, 12 hours ago with left sided weakness, nausea and confusion. She feels completely normal now. She does have a HX of TIAs. This felt like the TIAs she has had in the past. No facial droop. No drift, no slurred speech. Per family pt still seems weaker than normal. Right sided arm weakness.

## 2019-02-06 NOTE — ED Provider Notes (Addendum)
[facility] EMERGENCY DEPARTMENT Provider Note   CSN: [id number] Arrival date & time: 02/06/19  1507     History   Chief Complaint Chief Complaint  Patient presents with  . Weakness    HPI Holly Nunez is a 85 y.o. female.     Patient brought in by family members.  For some confusion this morning nausea patient being kind of days.  And generalized weakness.  Patient seemed to be fine yesterday.  There was some mention of perhaps left hand numbness left-sided weakness.  But I spoke with her son who lives with her and there was more generalized weakness there was no real focal deficit.  Patient has had TIAs in the past.  Patient now states she feels fine.     Past Medical History:  Diagnosis Date  . Arthritis   . Estrogen deficiency 05/27/2016  . Great toe pain, left 12/02/2016  . H/O measles   . H/O mumps   . History of chicken pox   . Hyperglycemia 05/27/2016  . Hyperlipidemia   . Hypertension   . Patent foramen ovale   . Preventative health care 02/26/2015  . Renal insufficiency   . Thyroid nodule    Biopsy-negative  . TIA (transient ischemic attack)   . Urinary incontinence     Patient Active Problem List   Diagnosis Date Noted  . Trauma 09/23/2017  . Debility 06/21/2017  . Great toe pain, left 12/02/2016  . Vitamin D deficiency 12/02/2016  . Hyperglycemia 05/27/2016  . Estrogen deficiency 05/27/2016  . Overactive bladder 12/07/2015  . Allergy 12/07/2015  . Multinodular goiter (nontoxic) 04/25/2015  . Preventative health care 02/26/2015  . History of chicken pox   . Renal insufficiency 03/07/2014  . HTN (hypertension) 02/19/2014  . Hyperlipidemia 02/19/2014  . DJD (degenerative joint disease) 02/19/2014  . S/P hysterectomy 02/19/2014  . Anemia 02/19/2014  . Urinary incontinence 02/19/2014    Past Surgical History:  Procedure Laterality Date  . ABDOMINAL HYSTERECTOMY     fibroid  . APPENDECTOMY    . EYE SURGERY Bilateral    CATARACT  REMOVAL  . JOINT REPLACEMENT    . REPLACEMENT TOTAL KNEE Right 2007  . THYROID LOBECTOMY Left 04/25/2015  . THYROID LOBECTOMY Left 04/25/2015   Procedure: LEFT THYROID LOBECTOMY;  Surgeon: Armandina Gemma, MD;  Location: [place];  Service: General;  Laterality: Left;  . TRIGGER FINGER RELEASE  2007     OB History   No obstetric history on file.      Home Medications    Prior to Admission medications   Medication Sig Start Date End Date Taking? Authorizing Provider  acetaminophen (TYLENOL) 650 MG CR tablet Take 1,300 mg by mouth every 8 (eight) hours as needed for pain.    [provider]  atorvastatin (LIPITOR) 40 MG tablet TAKE 1 TABLET(40 MG) BY MOUTH DAILY 10/17/18   Mosie Lukes, MD  azelastine (OPTIVAR) 0.05 % ophthalmic solution Place 0.005 drops into both eyes at bedtime. 06/08/17   [provider]  carvedilol (COREG) 3.125 MG tablet TAKE 2 TABLETS(6.25 MG) BY MOUTH TWICE DAILY 10/18/18   Mosie Lukes, MD  cephALEXin (KEFLEX) 500 MG capsule Take 1 capsule (500 mg total) by mouth 4 (four) times daily. 02/06/19   Fredia Sorrow, MD  dorzolamide (TRUSOPT) 2 % ophthalmic solution Place 2 drops into both eyes at bedtime. 06/14/17   [provider]  latanoprost (XALATAN) 0.005 % ophthalmic solution Place 1 drop into both eyes as  directed.  02/18/16   [provider]  nitroGLYCERIN (NITROLINGUAL) 0.4 MG/SPRAY spray Place 1 spray under the tongue every 5 (five) minutes x 3 doses as needed for chest pain.    [provider]  oxybutynin (DITROPAN-XL) 10 MG 24 hr tablet TAKE 1 TABLET(10 MG) BY MOUTH AT BEDTIME 01/29/19   Mosie Lukes, MD  Rivaroxaban (XARELTO) 15 MG TABS tablet Take 1 tablet (15 mg total) by mouth daily with supper. 09/20/18   Lelon Perla, MD  timolol (TIMOPTIC) 0.5 % ophthalmic solution Place 1 drop into both eyes at bedtime. 06/14/17   [provider]  triamterene-hydrochlorothiazide (MAXZIDE-25) 37.5-25 MG tablet TAKE 1  TABLET BY MOUTH DAILY 08/21/18   Mosie Lukes, MD    Family History Family History  Problem Relation Age of Onset  . Heart disease Mother   . Hypertension Mother   . Kidney disease Sister   . Mental illness Sister        suicide, depresion  . Gout Brother   . Hypertension Son   . Cancer Maternal Grandmother   . Cancer Sister   . Bone cancer Sister   . Cancer Sister   . Cancer Sister        kidney  . Cancer Daughter   . Pancreatic cancer Daughter   . Thyroid disease Daughter   . Thyroid disease Daughter        thyroid nodules  . Hypertension Daughter   . Irritable bowel syndrome Daughter   . Cancer Daughter        thyroid  . Cancer Brother   . Prostate cancer Brother     Social History Social History   Tobacco Use  . Smoking status: Never Smoker  . Smokeless tobacco: Never Used  Substance Use Topics  . Alcohol use: No  . Drug use: No     Allergies   Alphagan [brimonidine]   Review of Systems Review of Systems  Constitutional: Positive for fatigue. Negative for chills and fever.  HENT: Negative for rhinorrhea and sore throat.   Eyes: Negative for visual disturbance.  Respiratory: Negative for cough and shortness of breath.   Cardiovascular: Negative for chest pain and leg swelling.  Gastrointestinal: Positive for nausea. Negative for abdominal pain, diarrhea and vomiting.  Genitourinary: Negative for dysuria and hematuria.  Musculoskeletal: Negative for back pain and neck pain.  Skin: Negative for rash.  Neurological: Positive for weakness. Negative for dizziness, light-headedness and headaches.  Hematological: Does not bruise/bleed easily.  Psychiatric/Behavioral: Positive for confusion.     Physical Exam Updated Vital Signs BP (!) 145/129 (BP Location: Right Arm)   Pulse 68   Temp 98.5 F (36.9 C) (Oral)   Resp 18   Ht 1.524 m (5')   Wt 65.8 kg   SpO2 100%   BMI 28.32 kg/m   Physical Exam Vitals signs and nursing note reviewed.   Constitutional:      General: She is not in acute distress.    Appearance: Normal appearance. She is well-developed.  HENT:     Head: Normocephalic and atraumatic.  Eyes:     Extraocular Movements: Extraocular movements intact.     Conjunctiva/sclera: Conjunctivae normal.     Pupils: Pupils are equal, round, and reactive to light.  Neck:     Musculoskeletal: Normal range of motion and neck supple.  Cardiovascular:     Rate and Rhythm: Normal rate and regular rhythm.     Heart sounds: No murmur.  Pulmonary:  Effort: Pulmonary effort is normal. No respiratory distress.     Breath sounds: Normal breath sounds.  Abdominal:     Palpations: Abdomen is soft.     Tenderness: There is no abdominal tenderness.  Skin:    General: Skin is warm and dry.  Neurological:     General: No focal deficit present.     Mental Status: She is alert.     Cranial Nerves: No cranial nerve deficit.     Sensory: No sensory deficit.     Motor: No weakness.     Comments: Does not appear confused or any significant confusion here.      ED Treatments / Results  Labs (all labs ordered are listed, but only abnormal results are displayed) Labs Reviewed  CBC WITH DIFFERENTIAL/PLATELET - Abnormal; Notable for the following components:      Result Value   RBC 3.58 (*)    Hemoglobin 11.2 (*)    HCT 34.2 (*)    Lymphs Abs 0.6 (*)    All other components within normal limits  URINALYSIS, ROUTINE W REFLEX MICROSCOPIC - Abnormal; Notable for the following components:   APPearance CLOUDY (*)    Nitrite POSITIVE (*)    Leukocytes,Ua MODERATE (*)    All other components within normal limits  COMPREHENSIVE METABOLIC PANEL - Abnormal; Notable for the following components:   Glucose, Bld 142 (*)    BUN 28 (*)    Creatinine, Ser 1.56 (*)    GFR calc non Af Amer 29 (*)    GFR calc Af Amer 33 (*)    All other components within normal limits  URINALYSIS, MICROSCOPIC (REFLEX) - Abnormal; Notable for the  following components:   Bacteria, UA MANY (*)    All other components within normal limits  URINE CULTURE    EKG EKG Interpretation  Date/Time:  Tuesday February 06 2019 17:11:45 EDT Ventricular Rate:  74 PR Interval:    QRS Duration: 111 QT Interval:  407 QTC Calculation: 452 R Axis:   62 Text Interpretation:  Sinus rhythm Borderline T wave abnormalities No previous ECGs available Confirmed by Fredia Sorrow [phone]) on 02/06/2019 5:21:23 PM   Radiology Dg Chest 2 View  Result Date: 02/06/2019 CLINICAL DATA:  left sided weakness, nausea and confusion. She feels completely normal now. She does have a HX of TIAs. This felt like the TIAs she has had in the past. No facial droop. No drift, no slurred speech. Per family pt still seems weaker than normal. Right sided arm weakness. EXAM: CHEST - 2 VIEW COMPARISON:  04/18/2015 FINDINGS: Lungs clear.  Surgical clips at the thoracic inlet on the left. Heart size and mediastinal contours are within normal limits. Tortuous thoracic aorta. Visualized bones unremarkable. IMPRESSION: No acute cardiopulmonary disease. Electronically Signed   By: Lucrezia Europe M.D.   On: 02/06/2019 17:05   Ct Head Wo Contrast  Result Date: 02/06/2019 CLINICAL DATA:  Left-sided weakness, TIA EXAM: CT HEAD WITHOUT CONTRAST TECHNIQUE: Contiguous axial images were obtained from the base of the skull through the vertex without intravenous contrast. COMPARISON:  None available FINDINGS: Brain: No evidence of acute infarction, hemorrhage, hydrocephalus, extra-axial collection or mass lesion/mass effect. Extensive low-density changes within the periventricular and subcortical white matter compatible with chronic microvascular ischemic change. Mild diffuse cerebral volume loss. Vascular: Dense atherosclerotic calcifications involving the large vessels of the skull base. No unexpected hyperdense vessel. Skull: Normal. Negative for fracture or focal lesion. Sinuses/Orbits: No acute  finding. Other: None. IMPRESSION: 1.  No acute intracranial findings. Follow-up exams with MRI, as clinically indicated. 2. Chronic microvascular ischemic change and cerebral volume loss. Electronically Signed   By: Davina Poke M.D.   On: 02/06/2019 17:02    Procedures Procedures (including critical care time)  Medications Ordered in ED Medications  0.9 %  sodium chloride infusion ( Intravenous Hold 02/06/19 1802)  sodium chloride 0.9 % bolus 250 mL (0 mLs Intravenous Hold 02/06/19 1802)  cephALEXin (KEFLEX) capsule 500 mg (has no administration in time range)     Initial Impression / Assessment and Plan / ED Course  I have reviewed the triage vital signs and the nursing notes.  Pertinent labs & imaging results that were available during my care of the patient were reviewed by me and considered in my medical decision making (see chart for details).        Patient very alert here.  No evidence of any significant confusion.  No complaints currently.  But the general work-up.  Work-up significant for some worsening renal function.  Chest x-ray negative head CT without any acute findings.  Patient without any neurofocal deficit on exam.  Urinalysis though however very consistent with urinary tract infection probably explains the symptoms.  Urine sent for culture.  Patient given first dose of Keflex here.  Will be continued on Keflex with precautions.  We will have her renal function rechecked by her primary care doctor in the next 2 weeks.  Patient nontoxic no acute distress.  EKG without any arrhythmias.   In addition do not feel that patient's symptoms were consistent with a TIA or a CVA presentation there really was no focal deficit.  Either historically on exam here today.  Final Clinical Impressions(s) / ED Diagnoses   Final diagnoses:  Lower urinary tract infectious disease  Generalized weakness  Renal insufficiency    ED Discharge Orders         Ordered    cephALEXin  (KEFLEX) 500 MG capsule  4 times daily     02/06/19 1839           Fredia Sorrow, MD 02/06/19 1843    Fredia Sorrow, MD 02/06/19 1843

## 2019-02-06 NOTE — ED Notes (Signed)
MD aware of inability to obtain IV access even with u/s, IVF held.

## 2019-02-06 NOTE — ED Notes (Signed)
Pt in CT awaiting return to obtain labwork

## 2019-02-06 NOTE — ED Notes (Signed)
Placed US guided IV, but while drawing labs pt moved her arm causing dislodgement. EDP notified and is okay with pt having PO fluids. Pt provided with water PO for hydration.

## 2019-02-08 LAB — URINE CULTURE: Culture: >=100000 — AB

## 2019-02-09 ENCOUNTER — Ambulatory Visit (INDEPENDENT_AMBULATORY_CARE_PROVIDER_SITE_OTHER): Payer: Medicare Other

## 2019-02-09 ENCOUNTER — Other Ambulatory Visit: Payer: Self-pay

## 2019-02-09 ENCOUNTER — Telehealth: Payer: Self-pay

## 2019-02-09 DIAGNOSIS — Z23 Encounter for immunization: Secondary | ICD-10-CM | POA: Diagnosis not present

## 2019-02-09 NOTE — Telephone Encounter (Signed)
Post ED Visit - Positive Culture Follow-up  Culture report reviewed by antimicrobial stewardship pharmacist: [place] Team []  Elenor Quinones, Pharm.D. []  Heide Guile, Pharm.D., BCPS AQ-ID []  Parks Neptune, Pharm.D., BCPS []  Alycia Rossetti, Pharm.D., BCPS []  Filer, [place].D., BCPS, AAHIVP []  Legrand Como, Pharm.D., BCPS, AAHIVP []  Salome Arnt, PharmD, BCPS []  Johnnette Gourd, PharmD, BCPS [x]  Hughes Better, PharmD, BCPS []  Leeroy Cha, PharmD []  Laqueta Linden, PharmD, BCPS []  Albertina Parr, PharmD  [place] Team []  Leodis Sias, PharmD []  Lindell Spar, PharmD []  Royetta Asal, PharmD []  Graylin Shiver, Rph []  Rema Fendt) Glennon Mac, PharmD []  Arlyn Dunning, PharmD []  Netta Cedars, PharmD []  Dia Sitter, PharmD []  Leone Haven, PharmD []  Gretta Arab, PharmD []  Theodis Shove, PharmD []  Peggyann Juba, PharmD []  Reuel Boom, PharmD   Positive urine culture Treated with Cephalexin, organism sensitive to the same and no further patient follow-up is required at this time.  Genia Del 02/09/2019, 10:59 AM

## 2019-02-09 NOTE — Progress Notes (Signed)
Here for flu shot

## 2019-02-15 ENCOUNTER — Other Ambulatory Visit: Payer: Self-pay | Admitting: Family Medicine

## 2019-02-23 ENCOUNTER — Ambulatory Visit (INDEPENDENT_AMBULATORY_CARE_PROVIDER_SITE_OTHER): Payer: Medicare Other | Admitting: Family Medicine

## 2019-02-23 ENCOUNTER — Other Ambulatory Visit: Payer: Self-pay

## 2019-02-23 DIAGNOSIS — N289 Disorder of kidney and ureter, unspecified: Secondary | ICD-10-CM

## 2019-02-23 DIAGNOSIS — R5381 Other malaise: Secondary | ICD-10-CM | POA: Diagnosis not present

## 2019-02-23 DIAGNOSIS — R739 Hyperglycemia, unspecified: Secondary | ICD-10-CM

## 2019-02-23 DIAGNOSIS — I10 Essential (primary) hypertension: Secondary | ICD-10-CM | POA: Diagnosis not present

## 2019-02-23 DIAGNOSIS — N390 Urinary tract infection, site not specified: Secondary | ICD-10-CM | POA: Diagnosis not present

## 2019-02-25 DIAGNOSIS — N390 Urinary tract infection, site not specified: Secondary | ICD-10-CM | POA: Insufficient documentation

## 2019-02-25 NOTE — Assessment & Plan Note (Signed)
Hydrate and monitor 

## 2019-02-25 NOTE — Assessment & Plan Note (Signed)
She is encouraged to maintain as much quarantine as she is able with the help of her son Kerry Dory. Discussed getting a pulse oximeter to monitor herself if she becomes ill

## 2019-02-25 NOTE — Assessment & Plan Note (Signed)
She feels much better after treatment. Maintain adequate hydration, cranberry and probiotics.

## 2019-02-25 NOTE — Assessment & Plan Note (Signed)
hgba1c acceptable, minimize simple carbs. Increase exercise as tolerated.  

## 2019-02-25 NOTE — Assessment & Plan Note (Signed)
Monitor no changes to meds. Encouraged heart healthy diet such as the DASH diet and exercise as tolerated.

## 2019-02-25 NOTE — Progress Notes (Signed)
Virtual Visit via phone Note  I connected with Holly Nunez on 10/9/20at 10:20 AM EDT by a phone enabled telemedicine application and verified that I am speaking with the correct person using two identifiers.  Location: Patient: home Provider: home   I discussed the limitations of evaluation and management by telemedicine and the availability of in person appointments. The patient expressed understanding and agreed to proceed. [name], CMA was able to get the patient set up on visit, phone after being unable to set up video visit   Subjective:    Patient ID: Holly Nunez, female    DOB: November 11, 1924, 85 y.o.   MRN: [id number]  No chief complaint on file.   HPI Patient is in today for follow up after ER visit which showed a diagnosis of urinary tract infection. She is feeling much better after treatment and she has no acute concerns today. She is accompanied by her son on the visit and he concurs she is doing better. Denies CP/palp/SOB/HA/congestion/fevers/GI c/o. Taking meds as prescribed  Past Medical History:  Diagnosis Date  . Arthritis   . Estrogen deficiency 05/27/2016  . Great toe pain, left 12/02/2016  . H/O measles   . H/O mumps   . History of chicken pox   . Hyperglycemia 05/27/2016  . Hyperlipidemia   . Hypertension   . Patent foramen ovale   . Preventative health care 02/26/2015  . Renal insufficiency   . Thyroid nodule    Biopsy-negative  . TIA (transient ischemic attack)   . Urinary incontinence     Past Surgical History:  Procedure Laterality Date  . ABDOMINAL HYSTERECTOMY     fibroid  . APPENDECTOMY    . EYE SURGERY Bilateral    CATARACT REMOVAL  . JOINT REPLACEMENT    . REPLACEMENT TOTAL KNEE Right 2007  . THYROID LOBECTOMY Left 04/25/2015  . THYROID LOBECTOMY Left 04/25/2015   Procedure: LEFT THYROID LOBECTOMY;  Surgeon: Armandina Gemma, MD;  Location: [facility];  Service: General;  Laterality: Left;  . TRIGGER FINGER RELEASE  2007    Family  History  Problem Relation Age of Onset  . Heart disease Mother   . Hypertension Mother   . Kidney disease Sister   . Mental illness Sister        suicide, depresion  . Gout Brother   . Hypertension Son   . Cancer Maternal Grandmother   . Cancer Sister   . Bone cancer Sister   . Cancer Sister   . Cancer Sister        kidney  . Cancer Daughter   . Pancreatic cancer Daughter   . Thyroid disease Daughter   . Thyroid disease Daughter        thyroid nodules  . Hypertension Daughter   . Irritable bowel syndrome Daughter   . Cancer Daughter        thyroid  . Cancer Brother   . Prostate cancer Brother     Social History   Socioeconomic History  . Marital status: Widowed    Spouse name: Not on file  . Number of children: 4  . Years of education: Not on file  . Highest education level: Not on file  Occupational History  . Not on file  Social Needs  . Financial resource strain: Not on file  . Food insecurity    Worry: Not on file    Inability: Not on file  . Transportation needs    Medical: Not on file  Non-medical: Not on file  Tobacco Use  . Smoking status: Never Smoker  . Smokeless tobacco: Never Used  Substance and Sexual Activity  . Alcohol use: No  . Drug use: No  . Sexual activity: Not Currently    Partners: Male    Comment: no dietary restrictions, lives with 2 daughters  Lifestyle  . Physical activity    Days per week: Not on file    Minutes per session: Not on file  . Stress: Not on file  Relationships  . Social Herbalist on phone: Not on file    Gets together: Not on file    Attends religious service: Not on file    Active member of club or organization: Not on file    Attends meetings of clubs or organizations: Not on file    Relationship status: Not on file  . Intimate partner violence    Fear of current or ex partner: Not on file    Emotionally abused: Not on file    Physically abused: Not on file    Forced sexual activity: Not on  file  Other Topics Concern  . Not on file  Social History Narrative   No dietary restrictions and lives with daughters.    Outpatient Medications Prior to Visit  Medication Sig Dispense Refill  . acetaminophen (TYLENOL) 650 MG CR tablet Take 1,300 mg by mouth every 8 (eight) hours as needed for pain.    Marland Kitchen atorvastatin (LIPITOR) 40 MG tablet TAKE 1 TABLET(40 MG) BY MOUTH DAILY 90 tablet 1  . azelastine (OPTIVAR) 0.05 % ophthalmic solution Place 0.005 drops into both eyes at bedtime.  2  . carvedilol (COREG) 3.125 MG tablet TAKE 2 TABLETS(6.25 MG) BY MOUTH TWICE DAILY 360 tablet 1  . cephALEXin (KEFLEX) 500 MG capsule Take 1 capsule (500 mg total) by mouth 4 (four) times daily. 28 capsule 0  . dorzolamide (TRUSOPT) 2 % ophthalmic solution Place 2 drops into both eyes at bedtime.  6  . latanoprost (XALATAN) 0.005 % ophthalmic solution Place 1 drop into both eyes as directed.   1  . nitroGLYCERIN (NITROLINGUAL) 0.4 MG/SPRAY spray Place 1 spray under the tongue every 5 (five) minutes x 3 doses as needed for chest pain.    Marland Kitchen oxybutynin (DITROPAN-XL) 10 MG 24 hr tablet TAKE 1 TABLET(10 MG) BY MOUTH AT BEDTIME 90 tablet 1  . Rivaroxaban (XARELTO) 15 MG TABS tablet Take 1 tablet (15 mg total) by mouth daily with supper. 30 tablet 11  . timolol (TIMOPTIC) 0.5 % ophthalmic solution Place 1 drop into both eyes at bedtime.  6  . triamterene-hydrochlorothiazide (MAXZIDE-25) 37.5-25 MG tablet TAKE 1 TABLET BY MOUTH DAILY 90 tablet 1   No facility-administered medications prior to visit.     Allergies  Allergen Reactions  . Alphagan [Brimonidine] Other (See Comments)    Secondary infection and vision changes    Review of Systems  Constitutional: Negative for fever and malaise/fatigue.  HENT: Negative for congestion.   Eyes: Negative for blurred vision.  Respiratory: Negative for shortness of breath.   Cardiovascular: Negative for chest pain, palpitations and leg swelling.  Gastrointestinal:  Negative for abdominal pain, blood in stool and nausea.  Genitourinary: Negative for dysuria and frequency.  Musculoskeletal: Negative for falls.  Skin: Negative for rash.  Neurological: Negative for dizziness, loss of consciousness and headaches.  Endo/Heme/Allergies: Negative for environmental allergies.  Psychiatric/Behavioral: Negative for depression. The patient is not nervous/anxious.  Objective:    Physical Exam Unable to obtain via phone visit  Wt 143 lb 4.8 oz (65 kg)   BMI 27.99 kg/m  Wt Readings from Last 3 Encounters:  02/23/19 143 lb 4.8 oz (65 kg)  02/06/19 145 lb (65.8 kg)  09/20/18 141 lb (64 kg)    Diabetic Foot Exam - Simple   No data filed     Lab Results  Component Value Date   WBC 8.0 02/06/2019   HGB 11.2 (L) 02/06/2019   HCT 34.2 (L) 02/06/2019   PLT 166 02/06/2019   GLUCOSE 142 (H) 02/06/2019   CHOL 160 05/29/2018   TRIG 90.0 05/29/2018   HDL 79.30 05/29/2018   LDLCALC 63 05/29/2018   ALT 11 02/06/2019   AST 29 02/06/2019   NA 136 02/06/2019   K 4.3 02/06/2019   CL 98 02/06/2019   CREATININE 1.56 (H) 02/06/2019   BUN 28 (H) 02/06/2019   CO2 25 02/06/2019   TSH 1.65 05/29/2018   INR 1.13 04/25/2015   HGBA1C 5.9 05/29/2018   MICROALBUR 2.2 (H) 01/23/2018    Lab Results  Component Value Date   TSH 1.65 05/29/2018   Lab Results  Component Value Date   WBC 8.0 02/06/2019   HGB 11.2 (L) 02/06/2019   HCT 34.2 (L) 02/06/2019   MCV 95.5 02/06/2019   PLT 166 02/06/2019   Lab Results  Component Value Date   NA 136 02/06/2019   K 4.3 02/06/2019   CO2 25 02/06/2019   GLUCOSE 142 (H) 02/06/2019   BUN 28 (H) 02/06/2019   CREATININE 1.56 (H) 02/06/2019   BILITOT 1.0 02/06/2019   ALKPHOS 49 02/06/2019   AST 29 02/06/2019   ALT 11 02/06/2019   PROT 7.1 02/06/2019   ALBUMIN 3.7 02/06/2019   CALCIUM 10.1 02/06/2019   ANIONGAP 13 02/06/2019   GFR 51.16 (L) 05/29/2018   Lab Results  Component Value Date   CHOL 160 05/29/2018    Lab Results  Component Value Date   HDL 79.30 05/29/2018   Lab Results  Component Value Date   LDLCALC 63 05/29/2018   Lab Results  Component Value Date   TRIG 90.0 05/29/2018   Lab Results  Component Value Date   CHOLHDL 2 05/29/2018   Lab Results  Component Value Date   HGBA1C 5.9 05/29/2018       Assessment & Plan:   Problem List Items Addressed This Visit    HTN (hypertension)    Monitor no changes to meds. Encouraged heart healthy diet such as the DASH diet and exercise as tolerated.       Renal insufficiency    Hydrate and monitor      Hyperglycemia    hgba1c acceptable, minimize simple carbs. Increase exercise as tolerated.       Debility    She is encouraged to maintain as much quarantine as she is able with the help of her son Kerry Dory. Discussed getting a pulse oximeter to monitor herself if she becomes ill      Urinary tract infection    She feels much better after treatment. Maintain adequate hydration, cranberry and probiotics.          I am having Holly Nunez maintain her nitroGLYCERIN, acetaminophen, latanoprost, azelastine, dorzolamide, timolol, Rivaroxaban, atorvastatin, carvedilol, oxybutynin, cephALEXin, and triamterene-hydrochlorothiazide.  No orders of the defined types were placed in this encounter.   I discussed the assessment and treatment plan with the patient. The patient was provided an opportunity to  ask questions and all were answered. The patient agreed with the plan and demonstrated an understanding of the instructions.   The patient was advised to call back or seek an in-person evaluation if the symptoms worsen or if the condition fails to improve as anticipated.  I provided 25 minutes of non-face-to-face time during this encounter.   Penni Homans, MD

## 2019-03-16 ENCOUNTER — Telehealth: Payer: Self-pay | Admitting: Family Medicine

## 2019-03-16 DIAGNOSIS — R5381 Other malaise: Secondary | ICD-10-CM

## 2019-03-16 DIAGNOSIS — E785 Hyperlipidemia, unspecified: Secondary | ICD-10-CM

## 2019-03-16 DIAGNOSIS — I10 Essential (primary) hypertension: Secondary | ICD-10-CM

## 2019-03-16 DIAGNOSIS — R739 Hyperglycemia, unspecified: Secondary | ICD-10-CM

## 2019-03-16 NOTE — Telephone Encounter (Signed)
Medications ordered

## 2019-03-16 NOTE — Telephone Encounter (Signed)
Patient's son, Holly Nunez  is calling is calling see if lab orders have been placed. Patient was advised to redo labs. Please advise [phone]

## 2019-03-26 ENCOUNTER — Other Ambulatory Visit (INDEPENDENT_AMBULATORY_CARE_PROVIDER_SITE_OTHER): Payer: Medicare Other

## 2019-03-26 ENCOUNTER — Other Ambulatory Visit: Payer: Self-pay

## 2019-03-26 DIAGNOSIS — I10 Essential (primary) hypertension: Secondary | ICD-10-CM | POA: Diagnosis not present

## 2019-03-26 DIAGNOSIS — E785 Hyperlipidemia, unspecified: Secondary | ICD-10-CM | POA: Diagnosis not present

## 2019-03-26 DIAGNOSIS — R739 Hyperglycemia, unspecified: Secondary | ICD-10-CM | POA: Diagnosis not present

## 2019-03-26 LAB — CBC
HCT: 37.3 % (ref 36.0–46.0)
Hemoglobin: 12.5 g/dL (ref 12.0–15.0)
MCHC: 33.5 g/dL (ref 30.0–36.0)
MCV: 95.8 fl (ref 78.0–100.0)
Platelets: 207 10*3/uL (ref 150.0–400.0)
RBC: 3.89 Mil/uL (ref 3.87–5.11)
RDW: 13.8 % (ref 11.5–15.5)
WBC: 4.7 10*3/uL (ref 4.0–10.5)

## 2019-03-26 LAB — LIPID PANEL
Cholesterol: 151 mg/dL (ref 0–200)
HDL: 70.7 mg/dL (ref >39.00)
LDL Cholesterol: 61 mg/dL (ref 0–99)
NonHDL: 79.88
Total CHOL/HDL Ratio: 2
Triglycerides: 96 mg/dL (ref 0.0–149.0)
VLDL: 19.2 mg/dL (ref 0.0–40.0)

## 2019-03-26 LAB — COMPREHENSIVE METABOLIC PANEL
ALT: 10 U/L (ref 0–35)
AST: 18 U/L (ref 0–37)
Albumin: 4.2 g/dL (ref 3.5–5.2)
Alkaline Phosphatase: 50 U/L (ref 39–117)
BUN: 20 mg/dL (ref 6–23)
CO2: 30 mEq/L (ref 19–32)
Calcium: 10.3 mg/dL (ref 8.4–10.5)
Chloride: 95 mEq/L — ABNORMAL LOW (ref 96–112)
Creatinine, Ser: 1.42 mg/dL — ABNORMAL HIGH (ref 0.40–1.20)
GFR: 41.86 mL/min — ABNORMAL LOW (ref >60.00)
Glucose, Bld: 121 mg/dL — ABNORMAL HIGH (ref 70–99)
Potassium: 3.8 mEq/L (ref 3.5–5.1)
Sodium: 134 mEq/L — ABNORMAL LOW (ref 135–145)
Total Bilirubin: 0.7 mg/dL (ref 0.2–1.2)
Total Protein: 7 g/dL (ref 6.0–8.3)

## 2019-03-26 LAB — HEMOGLOBIN A1C: Hgb A1c MFr Bld: 5.8 % (ref 4.6–6.5)

## 2019-03-26 LAB — TSH: TSH: 2.31 u[IU]/mL (ref 0.35–4.50)

## 2019-04-06 ENCOUNTER — Other Ambulatory Visit: Payer: Self-pay

## 2019-04-10 NOTE — Progress Notes (Signed)
Cardiology Clinic Note   Patient Name: Holly Nunez Date of Encounter: 04/11/2019  Primary Care Provider:  Mosie Lukes, MD Primary Cardiologist:  Holly Ruths, MD  Patient Profile    Holly Nunez 85 year old female presents today for follow-up of hypertension, and hyperlipidemia.  Past Medical History    Past Medical History:  Diagnosis Date   Arthritis    Estrogen deficiency 05/27/2016   Great toe pain, left 12/02/2016   H/O measles    H/O mumps    History of chicken pox    Hyperglycemia 05/27/2016   Hyperlipidemia    Hypertension    Patent foramen ovale    Preventative health care 02/26/2015   Renal insufficiency    Thyroid nodule    Biopsy-negative   TIA (transient ischemic attack)    Urinary incontinence    Past Surgical History:  Procedure Laterality Date   ABDOMINAL HYSTERECTOMY     fibroid   APPENDECTOMY     EYE SURGERY Bilateral    CATARACT REMOVAL   JOINT REPLACEMENT     REPLACEMENT TOTAL KNEE Right 2007   THYROID LOBECTOMY Left 04/25/2015   THYROID LOBECTOMY Left 04/25/2015   Procedure: LEFT THYROID LOBECTOMY;  Surgeon: Holly Gemma, MD;  Location: [facility];  Service: General;  Laterality: Left;   TRIGGER FINGER RELEASE  2007    Allergies  Allergies  Allergen Reactions   Alphagan [Brimonidine] Other (See Comments)    Secondary infection and vision changes    History of Present Illness    Holly Nunez has a past medical history of hypertension, hyperlipidemia, PFO, TIA and a thyroid nodule with negative biopsy.  She has moved from [place] to [place].  Echocardiogram on November 2014 showed normal LV function, mild to moderate left ventricular hypertrophy, grade 2 diastolic dysfunction, and aneurysmal atrial septum with PFO.  She was found to have moderate atherosclerosis of her aortic arch.  With her history of TIA she was initially treated with Plavix and aspirin but had recurrence and was later transitioned to  to Xarelto.  A CT obtained in September 2016 showed thyromegaly and coronary calcification.  A stress test in October 2016 showed an EF of 75% with normal perfusion.  She was last seen by Holly Deforest, PA-C on Oct 04, 2017.  During that time she was doing well however, she had recently fallen out of her bathtub after the bathtub handle broke.  She had injured the left side of her body but has since recovered.  She denied any bleeding issues at that time.  A hemoglobin was checked and did show a drop from 13.5-11.6.  Her renal function at that time and electrolytes were normal for her.  She stated she occasionally had palpitations indicating that they were around 1 time per week with no pattern.  Her carvedilol was not adjusted at that time.  She is also experiencing some occasional left hand numbness and continued observation was recommended.    She presents to the clinic today and states she is doing well.  Her son states that he does not feel like she needs a variety of foods however, her weight has been stable.  She continues to be very active at her house cleaning and doing laundry.  Her son lives above her and does all the cooking.  She states she still has left hand numbness which has not gotten worse.  She describes it as not painful or tingling," just constantly numb."  Her son states that he feels  like she loses Pepto-Bismol frequently.  She states that she takes the medication for gas after she eats.  She denies chest pain, shortness of breath, lower extremity edema, fatigue, palpitations, melena, hematuria, hemoptysis, diaphoresis, weakness, presyncope, syncope, orthopnea, and PND.   Home Medications    Prior to Admission medications   Medication Sig Start Date End Date Taking? Authorizing Provider  acetaminophen (TYLENOL) 650 MG CR tablet Take 1,300 mg by mouth every 8 (eight) hours as needed for pain.    [provider]  atorvastatin (LIPITOR) 40 MG tablet TAKE 1 TABLET(40 MG) BY  MOUTH DAILY 10/17/18   Holly Lukes, MD  azelastine (OPTIVAR) 0.05 % ophthalmic solution Place 0.005 drops into both eyes at bedtime. 06/08/17   [provider]  carvedilol (COREG) 3.125 MG tablet TAKE 2 TABLETS(6.25 MG) BY MOUTH TWICE DAILY 10/18/18   Holly Lukes, MD  cephALEXin (KEFLEX) 500 MG capsule Take 1 capsule (500 mg total) by mouth 4 (four) times daily. 02/06/19   Holly Sorrow, MD  dorzolamide (TRUSOPT) 2 % ophthalmic solution Place 2 drops into both eyes at bedtime. 06/14/17   [provider]  latanoprost (XALATAN) 0.005 % ophthalmic solution Place 1 drop into both eyes as directed.  02/18/16   [provider]  nitroGLYCERIN (NITROLINGUAL) 0.4 MG/SPRAY spray Place 1 spray under the tongue every 5 (five) minutes x 3 doses as needed for chest pain.    [provider]  oxybutynin (DITROPAN-XL) 10 MG 24 hr tablet TAKE 1 TABLET(10 MG) BY MOUTH AT BEDTIME 01/29/19   Holly Lukes, MD  Rivaroxaban (XARELTO) 15 MG TABS tablet Take 1 tablet (15 mg total) by mouth daily with supper. 09/20/18   Holly Perla, MD  timolol (TIMOPTIC) 0.5 % ophthalmic solution Place 1 drop into both eyes at bedtime. 06/14/17   [provider]  triamterene-hydrochlorothiazide (MAXZIDE-25) 37.5-25 MG tablet TAKE 1 TABLET BY MOUTH DAILY 02/15/19   Holly Lukes, MD    Family History    Family History  Problem Relation Age of Onset   Heart disease Mother    Hypertension Mother    Kidney disease Sister    Mental illness Sister        suicide, depresion   Gout Brother    Hypertension Son    Cancer Maternal Grandmother    Cancer Sister    Bone cancer Sister    Cancer Sister    Cancer Sister        kidney   Cancer Daughter    Pancreatic cancer Daughter    Thyroid disease Daughter    Thyroid disease Daughter        thyroid nodules   Hypertension Daughter    Irritable bowel syndrome Daughter    Cancer Daughter        thyroid   Cancer  Brother    Prostate cancer Brother    She indicated that her mother is deceased. She indicated that her father is deceased. She indicated that all of her four sisters are deceased. She indicated that only one of her two brothers is alive. She indicated that her maternal grandmother is deceased. She indicated that her maternal grandfather is deceased. She indicated that her paternal grandmother is deceased. She indicated that her paternal grandfather is deceased. She indicated that two of her three daughters are alive. She indicated that her son is alive.  Social History    Social History   Socioeconomic History   Marital status: Widowed  Spouse name: Not on file   Number of children: 4   Years of education: Not on file   Highest education level: Not on file  Occupational History   Not on file  Social Needs   Financial resource strain: Not on file   Food insecurity    Worry: Not on file    Inability: Not on file   Transportation needs    Medical: Not on file    Non-medical: Not on file  Tobacco Use   Smoking status: Never Smoker   Smokeless tobacco: Never Used  Substance and Sexual Activity   Alcohol use: No   Drug use: No   Sexual activity: Not Currently    Partners: Male    Comment: no dietary restrictions, lives with 2 daughters  Lifestyle   Physical activity    Days per week: Not on file    Minutes per session: Not on file   Stress: Not on file  Relationships   Social connections    Talks on phone: Not on file    Gets together: Not on file    Attends religious service: Not on file    Active member of club or organization: Not on file    Attends meetings of clubs or organizations: Not on file    Relationship status: Not on file   Intimate partner violence    Fear of current or ex partner: Not on file    Emotionally abused: Not on file    Physically abused: Not on file    Forced sexual activity: Not on file  Other Topics Concern   Not on file   Social History Narrative   No dietary restrictions and lives with daughters.     Review of Systems    General:  No chills, fever, night sweats or weight changes.  Cardiovascular:  No chest pain, dyspnea on exertion, edema, orthopnea, palpitations, paroxysmal nocturnal dyspnea. Dermatological: No rash, lesions/masses Respiratory: No cough, dyspnea Urologic: No hematuria, dysuria Abdominal:   No nausea, vomiting, diarrhea, bright red blood per rectum, melena, or hematemesis Neurologic:  No visual changes, wkns, changes in mental status. All other systems reviewed and are otherwise negative except as noted above.  Physical Exam    VS:  BP 128/70 (BP Location: Left Arm, Patient Position: Sitting, Cuff Size: Large)    Pulse 73    Temp 98.4 F (36.9 C)    Ht 5' (1.524 m)    Wt 131 lb 9.6 oz (59.7 kg)    SpO2 99%    BMI 25.70 kg/m  , BMI Body mass index is 25.7 kg/m. GEN: Well nourished, well developed, in no acute distress. HEENT: normal. Neck: Supple, no JVD, carotid bruits, or masses. Cardiac: RRR, no murmurs, rubs, or gallops. No clubbing, cyanosis, edema.  Radials/DP/PT 2+ and equal bilaterally.  Respiratory:  Respirations regular and unlabored, clear to auscultation bilaterally. GI: Soft, nontender, nondistended, BS + x 4. MS: no deformity or atrophy. Skin: warm and dry, no rash. Neuro:  Strength and sensation are intact.  Left hand anterior/palmar numbness. Psych: Normal affect.  Accessory Clinical Findings    ECG personally reviewed by me today-none today- No acute changes  EKG 02/07/2019 Sinus rhythm 74 bpm  Myocardial perfusion study 02/27/2015  The left ventricular ejection fraction is hyperdynamic (>65%).  Nuclear stress EF: 75%.  There was no ST segment deviation noted during stress.  The study is normal.  This is a low risk study.   Normal lexiscan nuclear stress test  demonstrating normal myocardial perfusion and function: EF 75%.  Assessment & Plan   1.   Essential hypertension-BP today 128/70 Continue carvedilol 3.125 mg twice daily Continue Maxide 37.5-25 mg tablet daily Heart healthy low-sodium diet-salty 6 given Increase physical activity as tolerated  Hyperlipidemia-03/26/2019: Cholesterol 151; HDL 70.70; LDL Cholesterol 61; Triglycerides 96.0; VLDL 19.2 Continue atorvastatin 40 mg daily Heart healthy low-sodium high-fiber diet Increase physical activity as tolerated  Left hand numbness-has ongoing left hand numbness.  This appears to be from overuse and pressure into her hand from her walker handle. Pad walker handles. Continue to monitor  Abdominal discomfort-described as gas pain after eating.  Patient using Pepto-Bismol frequently after meals in the evening. Okay to try over-the-counter simethicone Avoid culprit foods  Increase ambulation after meals  TIA-history of TIA on low-dose Xarelto due to creatinine clearance of 26 ml/min Continue Xarelto 15 mg daily  Patent foramen ovale-observed on echocardiogram 03/28/2013  Disposition: Follow-up with Dr. Stanford Breed in 6 months.   Jossie Ng. [facility] [place] [address] Office [phone] Fax [phone]

## 2019-04-11 ENCOUNTER — Other Ambulatory Visit: Payer: Self-pay

## 2019-04-11 ENCOUNTER — Ambulatory Visit (INDEPENDENT_AMBULATORY_CARE_PROVIDER_SITE_OTHER): Payer: Medicare Other | Admitting: General Practice

## 2019-04-11 ENCOUNTER — Encounter: Payer: Self-pay | Admitting: General Practice

## 2019-04-11 VITALS — BP 128/70 | HR 73 | Temp 98.4°F | Ht 60.0 in | Wt 131.6 lb

## 2019-04-11 DIAGNOSIS — G459 Transient cerebral ischemic attack, unspecified: Secondary | ICD-10-CM | POA: Diagnosis not present

## 2019-04-11 DIAGNOSIS — I10 Essential (primary) hypertension: Secondary | ICD-10-CM | POA: Diagnosis not present

## 2019-04-11 DIAGNOSIS — E785 Hyperlipidemia, unspecified: Secondary | ICD-10-CM

## 2019-04-11 DIAGNOSIS — Q2112 Patent foramen ovale: Secondary | ICD-10-CM

## 2019-04-11 DIAGNOSIS — Q211 Atrial septal defect: Secondary | ICD-10-CM | POA: Diagnosis not present

## 2019-04-11 DIAGNOSIS — R109 Unspecified abdominal pain: Secondary | ICD-10-CM | POA: Diagnosis not present

## 2019-04-11 DIAGNOSIS — R200 Anesthesia of skin: Secondary | ICD-10-CM

## 2019-04-11 DIAGNOSIS — E7800 Pure hypercholesterolemia, unspecified: Secondary | ICD-10-CM | POA: Diagnosis not present

## 2019-04-11 MED ORDER — NITROGLYCERIN 0.4 MG/SPRAY TL SOLN: 1.0000 | 2 refills | Status: DC | PRN

## 2019-04-11 NOTE — Patient Instructions (Addendum)
Medication Instructions:   Your physician recommends that you continue on your current medications as directed. Please refer to the Current Medication list given to you today.  *If you need a refill on your cardiac medications before your next appointment, please call your pharmacy*  Lab Work:  NONE ordered at this time of appointment   If you have labs (blood work) drawn today and your tests are completely normal, you will receive your results only by: Marland Kitchen MyChart Message (if you have MyChart) OR . A paper copy in the mail If you have any lab test that is abnormal or we need to change your treatment, we will call you to review the results.  Testing/Procedures:  NONE ordered at this time of appointment   Follow-Up: At [facility], you and your health needs are our priority.  As part of our continuing mission to provide you with exceptional heart care, we have created designated Provider Care Teams.  These Care Teams include your primary Cardiologist (physician) and Advanced Practice Providers (APPs -  Physician Assistants and Nurse Practitioners) who all work together to provide you with the care you need, when you need it.  Your next appointment:   6 month(s)  The format for your next appointment:   In Person  Provider:   Kirk Ruths, MD  Other Instructions  You may take a multivitamin daily  Pad the handles on walker  Simethicone chewable tablets What is this medicine? SIMETHICONE (sye METH i kone) is used to decrease the discomfort caused by gas. This medicine may be used for other purposes; ask your health care provider or pharmacist if you have questions. COMMON BRAND NAME(S): Gas Relief, Gas-X, Gas-X Extra Strength, Gas-X with Maalox, Genasyme, Maalox Max, Maalox Max Plus [place], Mylanta Gas, Mytab Gas, Phazyme, Titralac Plus What should I tell my health care provider before I take this medicine? They need to know if you have any of these  conditions:  phenylketonuria  an unusual or allergic reaction to simethicone, other medicines, foods, dyes, or preservatives  pregnant or trying to get pregnant  breast-feeding How should I use this medicine? Take this medicine by mouth. Crush or chew the tablets. Do not swallow them whole. Follow the directions on the label or those given to you by your doctor or health care professional. Do not take your medicine more often than directed. Talk to your pediatrician regarding the use of this medicine in children. While this medicine may be used in children as young as 12 years for selected conditions, precautions do apply. Overdosage: If you think you have taken too much of this medicine contact a poison control center or emergency room at once. NOTE: This medicine is only for you. Do not share this medicine with others. What if I miss a dose? This does not apply. You will only use this medicine as needed for gas pain. Do not use double or extra doses. What may interact with this medicine? Interactions are not expected. This list may not describe all possible interactions. Give your health care provider a list of all the medicines, herbs, non-prescription drugs, or dietary supplements you use. Also tell them if you smoke, drink alcohol, or use illegal drugs. Some items may interact with your medicine. What should I watch for while using this medicine? Tell your doctor or health care professional if your symptoms get worse, or if you have severe pain, diarrhea, constipation, or blood in your stool. These could be signs of a more serious  condition. What side effects may I notice from receiving this medicine? There are no reported side effects of this medicine. This list may not describe all possible side effects. Call your doctor for medical advice about side effects. You may report side effects to FDA at 1-800-FDA-1088. Where should I keep my medicine? Keep out of the reach of  children. Store at room temperature between 15 and 30 degrees C (59 and 86 degrees F). Keep container tightly closed. Throw away any unused medicine after the expiration date. NOTE: This sheet is a summary. It may not cover all possible information. If you have questions about this medicine, talk to your doctor, pharmacist, or health care provider.  2020 Elsevier/Gold Standard (2008-01-03 15:45:11)

## 2019-04-16 ENCOUNTER — Telehealth: Payer: Self-pay

## 2019-04-16 ENCOUNTER — Telehealth: Payer: Self-pay | Admitting: General Practice

## 2019-04-16 ENCOUNTER — Telehealth: Payer: Self-pay | Admitting: Cardiology

## 2019-04-16 ENCOUNTER — Other Ambulatory Visit: Payer: Self-pay | Admitting: Family Medicine

## 2019-04-16 MED ORDER — TIMOLOL HEMIHYDRATE 0.25 % OP SOLN: 1.0000 [drp] | Freq: Two times a day (BID) | OPHTHALMIC | 12 refills | Status: DC

## 2019-04-16 NOTE — Telephone Encounter (Signed)
DISCUSSED NITRO-SPRAY REFILL WITH DON HE STATES THAT HE DOES NOT NEED A REFILL AT THIS TIME HE JUST WANTED TO KNOW IF THE BOTTLE  WAS GOOD UNTIL THE EXPIRATION DATE ON THE BOTTLE. D/W RPH SHE STATES THAT AS LONG AS THE BOTTLE IS LIGHT BLOCKING (BROWN GLASS) IT IS GOOD UNTIL THE EXP DATE ON THE BOTTLE. LEFT DETAILED MESSAGE FOR SON/PT. HE WILL CB WHEN HE NEEDS A REFILL

## 2019-04-16 NOTE — Telephone Encounter (Signed)
Will route to make NP aware. Thanks!

## 2019-04-16 NOTE — Telephone Encounter (Signed)
Follow Up:      Pt saw Barnetta Hammersmith on 04-11-19 and was supposed to call back with the name of the medicine that she was taking for her eyes. She is now only taking Timolol. The other 3 medicine for her eyes, she is no longer taking them.

## 2019-04-20 MED ORDER — NITROGLYCERIN 0.4 MG SL SUBL: 0.4000 mg | SUBLINGUAL_TABLET | SUBLINGUAL | 11 refills | Status: DC | PRN

## 2019-04-20 NOTE — Telephone Encounter (Signed)
Per pt's son's call he needs to speak to someone about her NitroGlycerin medication please give him a call back.  Verbal given by pt.

## 2019-04-20 NOTE — Telephone Encounter (Signed)
Spoke with pt son, refill for NTG sent to the pharmacy.

## 2019-07-31 ENCOUNTER — Other Ambulatory Visit: Payer: Self-pay | Admitting: Family Medicine

## 2019-07-31 DIAGNOSIS — N3281 Overactive bladder: Secondary | ICD-10-CM

## 2019-08-07 ENCOUNTER — Other Ambulatory Visit: Payer: Self-pay | Admitting: Family Medicine

## 2019-08-20 NOTE — Progress Notes (Signed)
Subjective:   Holly Nunez is a 86 y.o. female who presents for Medicare Annual (Subsequent) preventive examination.  Review of Systems:  Home Safety/Smoke Alarms: Feels safe in home. Smoke alarms in place.  Lives w/ son. Chair lift in place.   Female:      Mammo- declines.        Dexa scan- 06/07/16           Objective:     Vitals: BP (!) 134/49 (BP Location: Right Arm, Patient Position: Sitting) Comment: all vitals reported by  Kem Boroughs CMA  Pulse 60   Temp 98.6 F (37 C) (Temporal)   Resp 12   Ht 5' (1.524 m)   Wt 133 lb (60.3 kg)   SpO2 99%   BMI 25.97 kg/m   Body mass index is 25.97 kg/m.  Advanced Directives 08/21/2019 02/06/2019 12/23/2017 11/25/2015 04/25/2015  Does Patient Have a Medical Advance Directive? No No No No No  Would patient like information on creating a medical advance directive? No - Patient declined No - Patient declined Yes (MAU/Ambulatory/Procedural Areas - Information given) Yes - Educational materials given Yes - Scientist, clinical (histocompatibility and immunogenetics) given    Tobacco Social History   Tobacco Use  Smoking Status Never Smoker  Smokeless Tobacco Never Used     Counseling given: Not Answered   Clinical Intake: Pain : No/denies pain     Past Medical History:  Diagnosis Date  . Arthritis   . Estrogen deficiency 05/27/2016  . Great toe pain, left 12/02/2016  . H/O measles   . H/O mumps   . History of chicken pox   . Hyperglycemia 05/27/2016  . Hyperlipidemia   . Hypertension   . Patent foramen ovale   . Preventative health care 02/26/2015  . Renal insufficiency   . Thyroid nodule    Biopsy-negative  . TIA (transient ischemic attack)   . Urinary incontinence    Past Surgical History:  Procedure Laterality Date  . ABDOMINAL HYSTERECTOMY     fibroid  . APPENDECTOMY    . EYE SURGERY Bilateral    CATARACT REMOVAL  . JOINT REPLACEMENT    . REPLACEMENT TOTAL KNEE Right 2007  . THYROID LOBECTOMY Left 04/25/2015  . THYROID LOBECTOMY Left  04/25/2015   Procedure: LEFT THYROID LOBECTOMY;  Surgeon: Armandina Gemma, MD;  Location: [place];  Service: General;  Laterality: Left;  . TRIGGER FINGER RELEASE  2007   Family History  Problem Relation Age of Onset  . Heart disease Mother   . Hypertension Mother   . Kidney disease Sister   . Mental illness Sister        suicide, depresion  . Gout Brother   . Hypertension Son   . Cancer Maternal Grandmother   . Cancer Sister   . Bone cancer Sister   . Cancer Sister   . Cancer Sister        kidney  . Cancer Daughter   . Pancreatic cancer Daughter   . Thyroid disease Daughter   . Thyroid disease Daughter        thyroid nodules  . Hypertension Daughter   . Irritable bowel syndrome Daughter   . Cancer Daughter        thyroid  . Cancer Brother   . Prostate cancer Brother    Social History   Socioeconomic History  . Marital status: Widowed    Spouse name: Not on file  . Number of children: 4  . Years of education: Not on file  .  Highest education level: Not on file  Occupational History  . Not on file  Tobacco Use  . Smoking status: Never Smoker  . Smokeless tobacco: Never Used  Substance and Sexual Activity  . Alcohol use: No  . Drug use: No  . Sexual activity: Not Currently    Partners: Male    Comment: no dietary restrictions, lives with 2 daughters  Other Topics Concern  . Not on file  Social History Narrative   No dietary restrictions and lives with daughters.   Social Determinants of Health   Financial Resource Strain: Low Risk   . Difficulty of Paying Living Expenses: Not hard at all  Food Insecurity: No Food Insecurity  . Worried About Charity fundraiser in the Last Year: Never true  . Ran Out of Food in the Last Year: Never true  Transportation Needs: No Transportation Needs  . Lack of Transportation (Medical): No  . Lack of Transportation (Non-Medical): No  Physical Activity:   . Days of Exercise per Week:   . Minutes of Exercise per Session:     Stress:   . Feeling of Stress :   Social Connections:   . Frequency of Communication with Friends and Family:   . Frequency of Social Gatherings with Friends and Family:   . Attends Religious Services:   . Active Member of Clubs or Organizations:   . Attends Archivist Meetings:   Marland Kitchen Marital Status:     Outpatient Encounter Medications as of 08/21/2019  Medication Sig  . acetaminophen (TYLENOL) 650 MG CR tablet Take 1,300 mg by mouth every 8 (eight) hours as needed for pain.  Marland Kitchen atorvastatin (LIPITOR) 40 MG tablet TAKE 1 TABLET(40 MG) BY MOUTH DAILY  . carvedilol (COREG) 3.125 MG tablet TAKE 2 TABLETS(6.25 MG) BY MOUTH TWICE DAILY  . oxybutynin (DITROPAN-XL) 10 MG 24 hr tablet TAKE 1 TABLET(10 MG) BY MOUTH AT BEDTIME  . Rivaroxaban (XARELTO) 15 MG TABS tablet Take 1 tablet (15 mg total) by mouth daily with supper.  . timolol (TIMOPTIC) 0.5 % ophthalmic solution Place 1 drop into both eyes at bedtime.  . triamterene-hydrochlorothiazide (MAXZIDE-25) 37.5-25 MG tablet TAKE 1 TABLET BY MOUTH DAILY  . nitroGLYCERIN (NITROSTAT) 0.4 MG SL tablet Place 1 tablet (0.4 mg total) under the tongue every 5 (five) minutes as needed for chest pain.  Marland Kitchen timolol (BETIMOL) 0.25 % ophthalmic solution Apply 1-2 drops to eye 2 (two) times daily. (Patient not taking: Reported on 08/21/2019)  . [DISCONTINUED] cephALEXin (KEFLEX) 500 MG capsule Take 1 capsule (500 mg total) by mouth 4 (four) times daily. (Patient not taking: Reported on 04/11/2019)   No facility-administered encounter medications on file as of 08/21/2019.    Activities of Daily Living In your present state of health, do you have any difficulty performing the following activities: 08/21/2019  Hearing? N  Vision? N  Difficulty concentrating or making decisions? N  Walking or climbing stairs? N  Dressing or bathing? N  Doing errands, shopping? N  Preparing Food and eating ? N  Using the Toilet? N  In the past six months, have you  accidently leaked urine? Y  Do you have problems with loss of bowel control? N  Managing your Medications? Y  Comment son  Managing your Finances? Y  Comment son  Housekeeping or managing your Housekeeping? Y  Comment son  Some recent data might be hidden    Patient Care Team: Mosie Lukes, MD as PCP - General (Family Medicine)  Lelon Perla, MD as PCP - Cardiology (Cardiology)    Assessment:   This is a routine wellness examination for [place]. Physical assessment deferred to PCP.  Exercise Activities and Dietary recommendations Current Exercise Habits: The patient does not participate in regular exercise at present, Exercise limited by: None identified Diet (meal preparation, eat out, water intake, caffeinated beverages, dairy products, fruits and vegetables): in general, a "healthy" diet  , well balanced       Goals    . Maintain current health       Fall Risk Fall Risk  08/21/2019 04/06/2019 12/23/2017 11/25/2015 02/19/2014  Falls in the past year? 0 0 Yes No Yes  Comment - Emmi Telephone Survey: data to providers prior to load - - -  Number falls in past yr: 0 - 1 - 2 or more  Injury with Fall? 0 - No - -  Risk for fall due to : - - - - Impaired balance/gait  Follow up Education provided;Falls prevention discussed - Education provided;Falls prevention discussed - -   Depression Screen PHQ 2/9 Scores 08/21/2019 12/23/2017 11/25/2015 02/19/2014  PHQ - 2 Score 0 0 0 0     Cognitive Function   MMSE - Mini Mental State Exam 12/23/2017  Not completed: Refused     6CIT Screen 08/21/2019  What Year? 0 points  What month? 0 points  What time? 0 points  Count back from 20 2 points  Months in reverse 4 points  Repeat phrase 2 points  Total Score 8    Immunization History  Administered Date(s) Administered  . Fluad Quad(high Dose 65+) 02/09/2019  . Influenza, High Dose Seasonal PF 01/23/2018  . Influenza,inj,Quad PF,6+ Mos 02/19/2014, 01/17/2015  . Influenza-Unspecified  02/17/2017  . Pneumococcal Conjugate-13 11/25/2015  . Pneumococcal Polysaccharide-23 05/16/2013   Screening Tests Health Maintenance  Topic Date Due  . TETANUS/TDAP  Never done  . INFLUENZA VACCINE  12/16/2019  . DEXA SCAN  Completed  . PNA vac Low Risk Adult  Completed      Plan:    Please schedule your next medicare wellness visit with me in 1 yr.  Continue to eat heart healthy diet (full of fruits, vegetables, whole grains, lean protein, water--limit salt, fat, and sugar intake) and increase physical activity as tolerated.  Continue doing brain stimulating activities (puzzles, reading, adult coloring books, staying active) to keep memory sharp.   I have personally reviewed and noted the following in the patient's chart:   . Medical and social history . Use of alcohol, tobacco or illicit drugs  . Current medications and supplements . Functional ability and status . Nutritional status . Physical activity . Advanced directives . List of other physicians . Hospitalizations, surgeries, and ER visits in previous 12 months . Vitals . Screenings to include cognitive, depression, and falls . Referrals and appointments  In addition, I have reviewed and discussed with patient certain preventive protocols, quality metrics, and best practice recommendations. A written personalized care plan for preventive services as well as general preventive health recommendations were provided to patient.     Naaman Plummer [place], [place]  08/21/2019

## 2019-08-21 ENCOUNTER — Encounter: Payer: Self-pay | Admitting: *Deleted

## 2019-08-21 ENCOUNTER — Other Ambulatory Visit: Payer: Self-pay

## 2019-08-21 ENCOUNTER — Ambulatory Visit (INDEPENDENT_AMBULATORY_CARE_PROVIDER_SITE_OTHER): Payer: Medicare Other | Admitting: *Deleted

## 2019-08-21 ENCOUNTER — Ambulatory Visit (INDEPENDENT_AMBULATORY_CARE_PROVIDER_SITE_OTHER): Payer: Medicare Other | Admitting: Family Medicine

## 2019-08-21 VITALS — BP 134/49 | HR 60 | Temp 98.6°F | Resp 12 | Ht 60.0 in | Wt 133.0 lb

## 2019-08-21 VITALS — BP 134/49 | HR 60 | Temp 98.6°F | Resp 12 | Ht 60.0 in | Wt 133.2 lb

## 2019-08-21 DIAGNOSIS — R1013 Epigastric pain: Secondary | ICD-10-CM | POA: Diagnosis not present

## 2019-08-21 DIAGNOSIS — N289 Disorder of kidney and ureter, unspecified: Secondary | ICD-10-CM

## 2019-08-21 DIAGNOSIS — Z0000 Encounter for general adult medical examination without abnormal findings: Secondary | ICD-10-CM

## 2019-08-21 DIAGNOSIS — R739 Hyperglycemia, unspecified: Secondary | ICD-10-CM | POA: Diagnosis not present

## 2019-08-21 DIAGNOSIS — E559 Vitamin D deficiency, unspecified: Secondary | ICD-10-CM

## 2019-08-21 DIAGNOSIS — I10 Essential (primary) hypertension: Secondary | ICD-10-CM

## 2019-08-21 DIAGNOSIS — D538 Other specified nutritional anemias: Secondary | ICD-10-CM

## 2019-08-21 DIAGNOSIS — E042 Nontoxic multinodular goiter: Secondary | ICD-10-CM | POA: Diagnosis not present

## 2019-08-21 DIAGNOSIS — E7800 Pure hypercholesterolemia, unspecified: Secondary | ICD-10-CM | POA: Diagnosis not present

## 2019-08-21 LAB — COMPREHENSIVE METABOLIC PANEL
ALT: 19 U/L (ref 0–35)
AST: 21 U/L (ref 0–37)
Albumin: 4.1 g/dL (ref 3.5–5.2)
Alkaline Phosphatase: 56 U/L (ref 39–117)
BUN: 25 mg/dL — ABNORMAL HIGH (ref 6–23)
CO2: 29 mEq/L (ref 19–32)
Calcium: 10.2 mg/dL (ref 8.4–10.5)
Chloride: 101 mEq/L (ref 96–112)
Creatinine, Ser: 1.43 mg/dL — ABNORMAL HIGH (ref 0.40–1.20)
GFR: 41.48 mL/min — ABNORMAL LOW (ref >60.00)
Glucose, Bld: 118 mg/dL — ABNORMAL HIGH (ref 70–99)
Potassium: 4.5 mEq/L (ref 3.5–5.1)
Sodium: 138 mEq/L (ref 135–145)
Total Bilirubin: 0.8 mg/dL (ref 0.2–1.2)
Total Protein: 7.1 g/dL (ref 6.0–8.3)

## 2019-08-21 LAB — LIPID PANEL
Cholesterol: 141 mg/dL (ref 0–200)
HDL: 81.3 mg/dL (ref >39.00)
LDL Cholesterol: 45 mg/dL (ref 0–99)
NonHDL: 60.12
Total CHOL/HDL Ratio: 2
Triglycerides: 78 mg/dL (ref 0.0–149.0)
VLDL: 15.6 mg/dL (ref 0.0–40.0)

## 2019-08-21 LAB — CBC
HCT: 35.4 % — ABNORMAL LOW (ref 36.0–46.0)
Hemoglobin: 12 g/dL (ref 12.0–15.0)
MCHC: 33.8 g/dL (ref 30.0–36.0)
MCV: 96.6 fl (ref 78.0–100.0)
Platelets: 174 10*3/uL (ref 150.0–400.0)
RBC: 3.67 Mil/uL — ABNORMAL LOW (ref 3.87–5.11)
RDW: 13.2 % (ref 11.5–15.5)
WBC: 5 10*3/uL (ref 4.0–10.5)

## 2019-08-21 LAB — VITAMIN D 25 HYDROXY (VIT D DEFICIENCY, FRACTURES): VITD: 45.39 ng/mL (ref 30.00–100.00)

## 2019-08-21 LAB — HEMOGLOBIN A1C: Hgb A1c MFr Bld: 5.5 % (ref 4.6–6.5)

## 2019-08-21 LAB — TSH: TSH: 1.62 u[IU]/mL (ref 0.35–4.50)

## 2019-08-21 MED ORDER — FAMOTIDINE 40 MG PO TABS: 40.0000 mg | ORAL_TABLET | Freq: Every day | ORAL | 5 refills | Status: DC

## 2019-08-21 NOTE — Patient Instructions (Signed)
Please schedule your next medicare wellness visit with me in 1 yr.  Continue to eat heart healthy diet (full of fruits, vegetables, whole grains, lean protein, water--limit salt, fat, and sugar intake) and increase physical activity as tolerated.  Continue doing brain stimulating activities (puzzles, reading, adult coloring books, staying active) to keep memory sharp.    Holly Nunez , Thank you for taking time to come for your Medicare Wellness Visit. I appreciate your ongoing commitment to your health goals. Please review the following plan we discussed and let me know if I can assist you in the future.   These are the goals we discussed: Goals    . Maintain current health       This is a list of the screening recommended for you and due dates:  Health Maintenance  Topic Date Due  . Tetanus Vaccine  Never done  . Flu Shot  12/16/2019  . DEXA scan (bone density measurement)  Completed  . Pneumonia vaccines  Completed    Preventive Care 58 Years and Older, Female Preventive care refers to lifestyle choices and visits with your health care provider that can promote health and wellness. This includes:  A yearly physical exam. This is also called an annual well check.  Regular dental and eye exams.  Immunizations.  Screening for certain conditions.  Healthy lifestyle choices, such as diet and exercise. What can I expect for my preventive care visit? Physical exam Your health care provider will check:  Height and weight. These may be used to calculate body mass index (BMI), which is a measurement that tells if you are at a healthy weight.  Heart rate and blood pressure.  Your skin for abnormal spots. Counseling Your health care provider may ask you questions about:  Alcohol, tobacco, and drug use.  Emotional well-being.  Home and relationship well-being.  Sexual activity.  Eating habits.  History of falls.  Memory and ability to understand (cognition).  Work  and work Statistician.  Pregnancy and menstrual history. What immunizations do I need?  Influenza (flu) vaccine  This is recommended every year. Tetanus, diphtheria, and pertussis (Tdap) vaccine  You may need a Td booster every 10 years. Varicella (chickenpox) vaccine  You may need this vaccine if you have not already been vaccinated. Zoster (shingles) vaccine  You may need this after age 71. Pneumococcal conjugate (PCV13) vaccine  One dose is recommended after age 16. Pneumococcal polysaccharide (PPSV23) vaccine  One dose is recommended after age 61. Measles, mumps, and rubella (MMR) vaccine  You may need at least one dose of MMR if you were born in 1957 or later. You may also need a second dose. Meningococcal conjugate (MenACWY) vaccine  You may need this if you have certain conditions. Hepatitis A vaccine  You may need this if you have certain conditions or if you travel or work in places where you may be exposed to hepatitis A. Hepatitis B vaccine  You may need this if you have certain conditions or if you travel or work in places where you may be exposed to hepatitis B. Haemophilus influenzae type b (Hib) vaccine  You may need this if you have certain conditions. You may receive vaccines as individual doses or as more than one vaccine together in one shot (combination vaccines). Talk with your health care provider about the risks and benefits of combination vaccines. What tests do I need? Blood tests  Lipid and cholesterol levels. These may be checked every 5 years, or  more frequently depending on your overall health.  Hepatitis C test.  Hepatitis B test. Screening  Lung cancer screening. You may have this screening every year starting at age 58 if you have a 30-pack-year history of smoking and currently smoke or have quit within the past 15 years.  Colorectal cancer screening. All adults should have this screening starting at age 28 and continuing until age 91.  Your health care provider may recommend screening at age 34 if you are at increased risk. You will have tests every 1-10 years, depending on your results and the type of screening test.  Diabetes screening. This is done by checking your blood sugar (glucose) after you have not eaten for a while (fasting). You may have this done every 1-3 years.  Mammogram. This may be done every 1-2 years. Talk with your health care provider about how often you should have regular mammograms.  BRCA-related cancer screening. This may be done if you have a family history of breast, ovarian, tubal, or peritoneal cancers. Other tests  Sexually transmitted disease (STD) testing.  Bone density scan. This is done to screen for osteoporosis. You may have this done starting at age 49. Follow these instructions at home: Eating and drinking  Eat a diet that includes fresh fruits and vegetables, whole grains, lean protein, and low-fat dairy products. Limit your intake of foods with high amounts of sugar, saturated fats, and salt.  Take vitamin and mineral supplements as recommended by your health care provider.  Do not drink alcohol if your health care provider tells you not to drink.  If you drink alcohol: ? Limit how much you have to 0-1 drink a day. ? Be aware of how much alcohol is in your drink. In the [place] one drink equals one 12 oz bottle of beer (355 mL), one 5 oz glass of wine (148 mL), or one 1 oz glass of hard liquor (44 mL). Lifestyle  Take daily care of your teeth and gums.  Stay active. Exercise for at least 30 minutes on 5 or more days each week.  Do not use any products that contain nicotine or tobacco, such as cigarettes, e-cigarettes, and chewing tobacco. If you need help quitting, ask your health care provider.  If you are sexually active, practice safe sex. Use a condom or other form of protection in order to prevent STIs (sexually transmitted infections).  Talk with your health care  provider about taking a low-dose aspirin or statin. What's next?  Go to your health care provider once a year for a well check visit.  Ask your health care provider how often you should have your eyes and teeth checked.  Stay up to date on all vaccines. This information is not intended to replace advice given to you by your health care provider. Make sure you discuss any questions you have with your health care provider. Document Revised: 04/27/2018 Document Reviewed: 04/27/2018 Elsevier Patient Education  2020 [employer].

## 2019-08-21 NOTE — Assessment & Plan Note (Signed)
Supplement and monitor 

## 2019-08-21 NOTE — Assessment & Plan Note (Signed)
Hydrate and monitor 

## 2019-08-21 NOTE — Assessment & Plan Note (Signed)
Well controlled, no changes to meds. Encouraged heart healthy diet such as the DASH diet and exercise as tolerated.  °

## 2019-08-21 NOTE — Progress Notes (Signed)
Subjective:    Patient ID: Holly Nunez, female    DOB: [birth year], 86 y.o.   MRN: [id number]  Chief Complaint  Patient presents with  . Hyperlipidemia  . Hypertension    HPI Patient is in today for for follw up on chronic medical concerns. No recent febrile illness or hospitalizations. She is accompanied by family and they report mostly she has been doing well. Her greatest complaint is indigestion. She has been struggling with dyspepsia, epigastric bloating and discomfort. She was using Pepto Bismol which helped some and she switched to Gas X which also helps some but her symptoms persist. Her appetite and bowel movements are unchanged. Denies CP/palp/SOB/HA/congestion/fevers or GU c/o. Taking meds as prescribed. Is quarantining and has been staying active.   Past Medical History:  Diagnosis Date  . Arthritis   . Estrogen deficiency 05/27/2016  . Great toe pain, left 12/02/2016  . H/O measles   . H/O mumps   . History of chicken pox   . Hyperglycemia 05/27/2016  . Hyperlipidemia   . Hypertension   . Patent foramen ovale   . Preventative health care 02/26/2015  . Renal insufficiency   . Thyroid nodule    Biopsy-negative  . TIA (transient ischemic attack)   . Urinary incontinence     Past Surgical History:  Procedure Laterality Date  . ABDOMINAL HYSTERECTOMY     fibroid  . APPENDECTOMY    . EYE SURGERY Bilateral    CATARACT REMOVAL  . JOINT REPLACEMENT    . REPLACEMENT TOTAL KNEE Right 2007  . THYROID LOBECTOMY Left 04/25/2015  . THYROID LOBECTOMY Left 04/25/2015   Procedure: LEFT THYROID LOBECTOMY;  Surgeon: Armandina Gemma, MD;  Location: [facility];  Service: General;  Laterality: Left;  . TRIGGER FINGER RELEASE  2007    Family History  Problem Relation Age of Onset  . Heart disease Mother   . Hypertension Mother   . Kidney disease Sister   . Mental illness Sister        suicide, depresion  . Gout Brother   . Hypertension Son   . Cancer Maternal Grandmother   .  Cancer Sister   . Bone cancer Sister   . Cancer Sister   . Cancer Sister        kidney  . Cancer Daughter   . Pancreatic cancer Daughter   . Thyroid disease Daughter   . Thyroid disease Daughter        thyroid nodules  . Hypertension Daughter   . Irritable bowel syndrome Daughter   . Cancer Daughter        thyroid  . Cancer Brother   . Prostate cancer Brother     Social History   Socioeconomic History  . Marital status: Widowed    Spouse name: Not on file  . Number of children: 4  . Years of education: Not on file  . Highest education level: Not on file  Occupational History  . Not on file  Tobacco Use  . Smoking status: Never Smoker  . Smokeless tobacco: Never Used  Substance and Sexual Activity  . Alcohol use: No  . Drug use: No  . Sexual activity: Not Currently    Partners: Male    Comment: no dietary restrictions, lives with 2 daughters  Other Topics Concern  . Not on file  Social History Narrative   No dietary restrictions and lives with daughters.   Social Determinants of Health   Financial Resource Strain: Low Risk   .  Difficulty of Paying Living Expenses: Not hard at all  Food Insecurity: No Food Insecurity  . Worried About Charity fundraiser in the Last Year: Never true  . Ran Out of Food in the Last Year: Never true  Transportation Needs: No Transportation Needs  . Lack of Transportation (Medical): No  . Lack of Transportation (Non-Medical): No  Physical Activity:   . Days of Exercise per Week:   . Minutes of Exercise per Session:   Stress:   . Feeling of Stress :   Social Connections:   . Frequency of Communication with Friends and Family:   . Frequency of Social Gatherings with Friends and Family:   . Attends Religious Services:   . Active Member of Clubs or Organizations:   . Attends Archivist Meetings:   Marland Kitchen Marital Status:   Intimate Partner Violence:   . Fear of Current or Ex-Partner:   . Emotionally Abused:   Marland Kitchen Physically  Abused:   . Sexually Abused:     Outpatient Medications Prior to Visit  Medication Sig Dispense Refill  . acetaminophen (TYLENOL) 650 MG CR tablet Take 1,300 mg by mouth every 8 (eight) hours as needed for pain.    Marland Kitchen atorvastatin (LIPITOR) 40 MG tablet TAKE 1 TABLET(40 MG) BY MOUTH DAILY 90 tablet 1  . carvedilol (COREG) 3.125 MG tablet TAKE 2 TABLETS(6.25 MG) BY MOUTH TWICE DAILY 360 tablet 1  . oxybutynin (DITROPAN-XL) 10 MG 24 hr tablet TAKE 1 TABLET(10 MG) BY MOUTH AT BEDTIME 90 tablet 1  . Rivaroxaban (XARELTO) 15 MG TABS tablet Take 1 tablet (15 mg total) by mouth daily with supper. 30 tablet 11  . timolol (BETIMOL) 0.25 % ophthalmic solution Apply 1-2 drops to eye 2 (two) times daily. (Patient not taking: Reported on 08/21/2019) 10 mL 12  . timolol (TIMOPTIC) 0.5 % ophthalmic solution Place 1 drop into both eyes at bedtime.  6  . triamterene-hydrochlorothiazide (MAXZIDE-25) 37.5-25 MG tablet TAKE 1 TABLET BY MOUTH DAILY 90 tablet 1  . nitroGLYCERIN (NITROSTAT) 0.4 MG SL tablet Place 1 tablet (0.4 mg total) under the tongue every 5 (five) minutes as needed for chest pain. 25 tablet 11  . cephALEXin (KEFLEX) 500 MG capsule Take 1 capsule (500 mg total) by mouth 4 (four) times daily. (Patient not taking: Reported on 04/11/2019) 28 capsule 0   No facility-administered medications prior to visit.    Allergies  Allergen Reactions  . Alphagan [Brimonidine] Other (See Comments)    Secondary infection and vision changes    Review of Systems  Constitutional: Negative for fever and malaise/fatigue.  HENT: Negative for congestion.   Eyes: Negative for blurred vision.  Respiratory: Negative for shortness of breath.   Cardiovascular: Negative for chest pain, palpitations and leg swelling.  Gastrointestinal: Positive for abdominal pain and heartburn. Negative for blood in stool, constipation, diarrhea and nausea.  Genitourinary: Negative for dysuria and frequency.  Musculoskeletal: Negative  for falls.  Skin: Negative for rash.  Neurological: Negative for dizziness, loss of consciousness and headaches.  Endo/Heme/Allergies: Negative for environmental allergies.  Psychiatric/Behavioral: Negative for depression. The patient is not nervous/anxious.        Objective:    Physical Exam Vitals and nursing note reviewed.  Constitutional:      General: She is not in acute distress.    Appearance: She is well-developed.  HENT:     Head: Normocephalic and atraumatic.     Nose: Nose normal.  Eyes:     General:  Right eye: No discharge.        Left eye: No discharge.  Cardiovascular:     Rate and Rhythm: Normal rate and regular rhythm.     Heart sounds: No murmur.  Pulmonary:     Effort: Pulmonary effort is normal.     Breath sounds: Normal breath sounds.  Abdominal:     General: Bowel sounds are normal.     Palpations: Abdomen is soft.     Tenderness: There is no abdominal tenderness.  Musculoskeletal:     Cervical back: Normal range of motion and neck supple.  Skin:    General: Skin is warm and dry.  Neurological:     Mental Status: She is alert and oriented to person, place, and time.     BP (!) 134/49 (BP Location: Right Arm, Cuff Size: Normal)   Pulse 60   Temp 98.6 F (37 C) (Temporal)   Resp 12   Ht 5' (1.524 m)   Wt 133 lb 3.2 oz (60.4 kg)   SpO2 99%   BMI 26.01 kg/m  Wt Readings from Last 3 Encounters:  08/21/19 133 lb (60.3 kg)  08/21/19 133 lb 3.2 oz (60.4 kg)  04/11/19 131 lb 9.6 oz (59.7 kg)    Diabetic Foot Exam - Simple   No data filed     Lab Results  Component Value Date   WBC 4.7 03/26/2019   HGB 12.5 03/26/2019   HCT 37.3 03/26/2019   PLT 207.0 03/26/2019   GLUCOSE 121 (H) 03/26/2019   CHOL 151 03/26/2019   TRIG 96.0 03/26/2019   HDL 70.70 03/26/2019   LDLCALC 61 03/26/2019   ALT 10 03/26/2019   AST 18 03/26/2019   NA 134 (L) 03/26/2019   K 3.8 03/26/2019   CL 95 (L) 03/26/2019   CREATININE 1.42 (H) 03/26/2019    BUN 20 03/26/2019   CO2 30 03/26/2019   TSH 2.31 03/26/2019   INR 1.13 04/25/2015   HGBA1C 5.8 03/26/2019   MICROALBUR 2.2 (H) 01/23/2018    Lab Results  Component Value Date   TSH 2.31 03/26/2019   Lab Results  Component Value Date   WBC 4.7 03/26/2019   HGB 12.5 03/26/2019   HCT 37.3 03/26/2019   MCV 95.8 03/26/2019   PLT 207.0 03/26/2019   Lab Results  Component Value Date   NA 134 (L) 03/26/2019   K 3.8 03/26/2019   CO2 30 03/26/2019   GLUCOSE 121 (H) 03/26/2019   BUN 20 03/26/2019   CREATININE 1.42 (H) 03/26/2019   BILITOT 0.7 03/26/2019   ALKPHOS 50 03/26/2019   AST 18 03/26/2019   ALT 10 03/26/2019   PROT 7.0 03/26/2019   ALBUMIN 4.2 03/26/2019   CALCIUM 10.3 03/26/2019   ANIONGAP 13 02/06/2019   GFR 41.86 (L) 03/26/2019   Lab Results  Component Value Date   CHOL 151 03/26/2019   Lab Results  Component Value Date   HDL 70.70 03/26/2019   Lab Results  Component Value Date   LDLCALC 61 03/26/2019   Lab Results  Component Value Date   TRIG 96.0 03/26/2019   Lab Results  Component Value Date   CHOLHDL 2 03/26/2019   Lab Results  Component Value Date   HGBA1C 5.8 03/26/2019       Assessment & Plan:   Problem List Items Addressed This Visit    HTN (hypertension) - Primary    Well controlled, no changes to meds. Encouraged heart healthy diet such as the DASH  diet and exercise as tolerated.       Relevant Orders   CBC   Comprehensive metabolic panel   TSH   Hyperlipidemia    Encouraged heart healthy diet, increase exercise, avoid trans fats, consider a krill oil cap daily      Relevant Orders   Lipid panel   Anemia    Increase leafy greens, consider increased lean red meat and using cast iron cookware. Continue to monitor, report any concerns      Renal insufficiency    Hydrate and monitor      Multinodular goiter (nontoxic)   Hyperglycemia    hgba1c acceptable, minimize simple carbs. Increase exercise as tolerated.        Relevant Orders   Hemoglobin A1c   Vitamin D deficiency    Supplement and monitor      Relevant Orders   VITAMIN D 25 Hydroxy (Vit-D Deficiency, Fractures)   Dyspepsia     Gas X some helpful. Avoid offending foods, start probiotics. Do not eat large meals in late evening and consider raising head of bed. Add Famotidine 40 mg qhs and monitor         I have discontinued Toryn Rasmusson's cephALEXin. I am also having her start on famotidine. Additionally, I am having her maintain her acetaminophen, timolol, Rivaroxaban, carvedilol, timolol, nitroGLYCERIN, oxybutynin, triamterene-hydrochlorothiazide, and atorvastatin.  Meds ordered this encounter  Medications  . famotidine (PEPCID) 40 MG tablet    Sig: Take 1 tablet (40 mg total) by mouth daily.    Dispense:  30 tablet    Refill:  5     Penni Homans, MD

## 2019-08-21 NOTE — Patient Instructions (Addendum)
Omron Blood Pressure cuff, upper arm, want BP 100-140/60-90 Pulse oximeter, want oxygen in 90s  Weekly vitals  Take Multivitamin with minerals, selenium Vitamin D 1000-2000 IU daily Probiotic with lactobacillus and bifidophilus Asprin EC 81 mg daily Krill or Fish oil daily Melatonin 2-5 mg at bedtime  https://garcia.net/ ToxicBlast.pl FEMA site  [facility]

## 2019-08-21 NOTE — Assessment & Plan Note (Signed)
Gas X some helpful. Avoid offending foods, start probiotics. Do not eat large meals in late evening and consider raising head of bed. Add Famotidine 40 mg qhs and monitor

## 2019-08-21 NOTE — Assessment & Plan Note (Signed)
Encouraged heart healthy diet, increase exercise, avoid trans fats, consider a krill oil cap daily 

## 2019-08-21 NOTE — Assessment & Plan Note (Signed)
Increase leafy greens, consider increased lean red meat and using cast iron cookware. Continue to monitor, report any concerns 

## 2019-08-21 NOTE — Assessment & Plan Note (Signed)
hgba1c acceptable, minimize simple carbs. Increase exercise as tolerated.  

## 2019-10-06 ENCOUNTER — Other Ambulatory Visit: Payer: Self-pay | Admitting: Family Medicine

## 2019-10-08 ENCOUNTER — Other Ambulatory Visit: Payer: Self-pay | Admitting: Pharmacist

## 2019-10-08 MED ORDER — RIVAROXABAN 15 MG PO TABS: 15.0000 mg | ORAL_TABLET | Freq: Every day | ORAL | 5 refills | Status: DC

## 2019-10-10 NOTE — Progress Notes (Signed)
Virtual Visit via Video Note   This visit type was conducted due to national recommendations for restrictions regarding the COVID-19 Pandemic (e.g. social distancing) in an effort to limit this patient's exposure and mitigate transmission in our community.  Due to her co-morbid illnesses, this patient is at least at moderate risk for complications without adequate follow up.  This format is felt to be most appropriate for this patient at this time.  All issues noted in this document were discussed and addressed.  A limited physical exam was performed with this format.  Please refer to the patient's chart for her consent to telehealth for [facility].   Date:  10/11/2019   ID:  Holly Nunez, DOB 11-19-1924, MRN [id number]  Patient Location: Home Provider Location: Home  PCP:  Mosie Lukes, MD  Cardiologist:  Kirk Ruths, MD  Electrophysiologist:  None   Evaluation Performed:  Follow-Up Visit  Chief Complaint:  Follow Up  History of Present Illness:    Holly Nunez is a 86 y.o. female with we are following for ongoing assessment and management of hypertension, hyperlipidemia, history of PFO, TIA, thyroid nodule with negative biopsy.  Echocardiogram on November 2014 showed normal LV function, mild to moderate left ventricular hypertrophy, grade 2 diastolic dysfunction, and aneurysmal atrial septum with PFO.  She was found to have moderate atherosclerosis of her aortic arch.  With her history of TIA she was initially treated with Plavix and aspirin but had recurrence and was later transitioned to to Xarelto.  A CT obtained in September 2016 showed thyromegaly and coronary calcification.  A stress test in October 2016 showed an EF of 75% with normal perfusion.  Seen last in the office by Coletta Memos, NP and was doing well without any cardiac complaints.  She does have a lot of GI symptoms with gas and GERD and uses Pepto-Bismol frequently.  Holly Nunez is doing very well today  as I see her on video visit.  She is looking younger than her stated age.  She offers no complaints of palpitations, bleeding, excessive bruising, dizziness, fluid retention, or shortness of breath.  Her daughter does report that she has fallen twice but did not have any injuries.  She uses a walker, she also has a chair lift to make sure she gets up the stairs without issue.  Her family is very attentive.  She takes her medications as directed.  Labs are followed by her primary care physician Dr. Charlett Blake.   The patient does not have symptoms concerning for COVID-19 infection (fever, chills, cough, or new shortness of breath).    Past Medical History:  Diagnosis Date  . Arthritis   . Estrogen deficiency 05/27/2016  . Great toe pain, left 12/02/2016  . H/O measles   . H/O mumps   . History of chicken pox   . Hyperglycemia 05/27/2016  . Hyperlipidemia   . Hypertension   . Patent foramen ovale   . Preventative health care 02/26/2015  . Renal insufficiency   . Thyroid nodule    Biopsy-negative  . TIA (transient ischemic attack)   . Urinary incontinence    Past Surgical History:  Procedure Laterality Date  . ABDOMINAL HYSTERECTOMY     fibroid  . APPENDECTOMY    . EYE SURGERY Bilateral    CATARACT REMOVAL  . JOINT REPLACEMENT    . REPLACEMENT TOTAL KNEE Right 2007  . THYROID LOBECTOMY Left 04/25/2015  . THYROID LOBECTOMY Left 04/25/2015   Procedure: LEFT THYROID LOBECTOMY;  Surgeon: Armandina Gemma, MD;  Location: [facility];  Service: General;  Laterality: Left;  . TRIGGER FINGER RELEASE  2007     Current Meds  Medication Sig  . acetaminophen (TYLENOL) 650 MG CR tablet Take 1,300 mg by mouth every 8 (eight) hours as needed for pain.  Marland Kitchen atorvastatin (LIPITOR) 40 MG tablet TAKE 1 TABLET(40 MG) BY MOUTH DAILY  . carvedilol (COREG) 3.125 MG tablet TAKE 2 TABLETS(6.25 MG) BY MOUTH TWICE DAILY  . famotidine (PEPCID) 40 MG tablet Take 1 tablet (40 mg total) by mouth daily.  . nitroGLYCERIN  (NITROSTAT) 0.4 MG SL tablet Place 1 tablet (0.4 mg total) under the tongue every 5 (five) minutes as needed for chest pain.  Marland Kitchen oxybutynin (DITROPAN-XL) 10 MG 24 hr tablet TAKE 1 TABLET(10 MG) BY MOUTH AT BEDTIME  . Rivaroxaban (XARELTO) 15 MG TABS tablet Take 1 tablet (15 mg total) by mouth daily with supper.  . timolol (TIMOPTIC) 0.5 % ophthalmic solution Place 1 drop into both eyes at bedtime.  . triamterene-hydrochlorothiazide (MAXZIDE-25) 37.5-25 MG tablet TAKE 1 TABLET BY MOUTH DAILY     Allergies:   Alphagan [brimonidine]   Social History   Tobacco Use  . Smoking status: Never Smoker  . Smokeless tobacco: Never Used  Substance Use Topics  . Alcohol use: No  . Drug use: No     Family Hx: The patient's family history includes Bone cancer in her sister; Cancer in her brother, daughter, daughter, maternal grandmother, sister, sister, and sister; Gout in her brother; Heart disease in her mother; Hypertension in her daughter, mother, and son; Irritable bowel syndrome in her daughter; Kidney disease in her sister; Mental illness in her sister; Pancreatic cancer in her daughter; Prostate cancer in her brother; Thyroid disease in her daughter and daughter.  ROS:   Please see the history of present illness.    All other systems reviewed and are negative.   Prior CV studies:   The following studies were reviewed today: NM Study 02/27/2015   The left ventricular ejection fraction is hyperdynamic (>65%).  Nuclear stress EF: 75%.  There was no ST segment deviation noted during stress.  The study is normal.  This is a low risk study.   Normal lexiscan nuclear stress test demonstrating normal myocardial perfusion and function: EF 75%.      Labs/Other Tests and Data Reviewed:    EKG:  No ECG reviewed.  Recent Labs: 08/21/2019: ALT 19; BUN 25; Creatinine, Ser 1.43; Hemoglobin 12.0; Platelets 174.0; Potassium 4.5; Sodium 138; TSH 1.62   Recent Lipid Panel Lab Results    Component Value Date/Time   CHOL 141 08/21/2019 12:03 PM   TRIG 78.0 08/21/2019 12:03 PM   HDL 81.30 08/21/2019 12:03 PM   CHOLHDL 2 08/21/2019 12:03 PM   LDLCALC 45 08/21/2019 12:03 PM    Wt Readings from Last 3 Encounters:  10/11/19 128 lb (58.1 kg)  08/21/19 133 lb (60.3 kg)  08/21/19 133 lb 3.2 oz (60.4 kg)     Objective:    Vital Signs:  BP 127/73   Pulse 60   Ht 5' (1.524 m)   Wt 128 lb (58.1 kg)   SpO2 98%   BMI 25.00 kg/m    VITAL SIGNS:  reviewed GEN:  no acute distress EYES:  sclerae anicteric, EOMI - Extraocular Movements Intact RESPIRATORY:  normal respiratory effort, symmetric expansion MUSCULOSKELETAL:  no obvious deformities. NEURO:  alert and oriented x 3, no obvious focal deficit PSYCH:  normal affect  ASSESSMENT & PLAN:    1.  Hypertension: Excellent control of blood pressure on medication regimen.  She denies any symptoms of dizziness when standing or fatigue.  Would not make any changes on her medication regimen.  She will be followed by PCP for labs.  2.  TIA: Continues on Xarelto.  She offers no complaints of bleeding bruising hemoptysis melena.  Due to history of occasional falls, I have asked her to be especially careful on anticoagulation therapy.  Her family is very attentive to her.  I have asked him to report any significant falls so that she can be evaluated.  They verbalized understanding.  She does use a walker for ambulation.  3.  Hyperlipidemia: Labs are followed by PCP.  Goal of LDL less than 70.  COVID-19 Education: The signs and symptoms of COVID-19 were discussed with the patient and how to seek care for testing (follow up with PCP or arrange E-visit).  The importance of social distancing was discussed today.  Time:   Today, I have spent 20 minutes with the patient with telehealth technology discussing the above problems preparing and reviewing records..     Medication Adjustments/Labs and Tests Ordered: Current medicines are  reviewed at length with the patient today.  Concerns regarding medicines are outlined above.   Tests Ordered: No orders of the defined types were placed in this encounter.   Medication Changes: No orders of the defined types were placed in this encounter.   Disposition:  Follow up 6 months   Signed, Phill Myron. West Pugh, ANP, AACC  10/11/2019 9:40 AM    [facility]

## 2019-10-11 ENCOUNTER — Telehealth (INDEPENDENT_AMBULATORY_CARE_PROVIDER_SITE_OTHER): Payer: Medicare Other | Admitting: Adult Health

## 2019-10-11 ENCOUNTER — Encounter: Payer: Self-pay | Admitting: Adult Health

## 2019-10-11 VITALS — BP 127/73 | HR 60 | Ht 60.0 in | Wt 128.0 lb

## 2019-10-11 DIAGNOSIS — I10 Essential (primary) hypertension: Secondary | ICD-10-CM | POA: Diagnosis not present

## 2019-10-11 DIAGNOSIS — I639 Cerebral infarction, unspecified: Secondary | ICD-10-CM

## 2019-10-11 DIAGNOSIS — E785 Hyperlipidemia, unspecified: Secondary | ICD-10-CM

## 2019-10-11 NOTE — Patient Instructions (Signed)

## 2019-11-25 DIAGNOSIS — Z23 Encounter for immunization: Secondary | ICD-10-CM | POA: Diagnosis not present

## 2019-12-23 DIAGNOSIS — Z23 Encounter for immunization: Secondary | ICD-10-CM | POA: Diagnosis not present

## 2020-01-25 ENCOUNTER — Other Ambulatory Visit: Payer: Self-pay | Admitting: Family Medicine

## 2020-01-25 DIAGNOSIS — N3281 Overactive bladder: Secondary | ICD-10-CM

## 2020-02-12 ENCOUNTER — Other Ambulatory Visit: Payer: Self-pay

## 2020-02-12 ENCOUNTER — Ambulatory Visit (INDEPENDENT_AMBULATORY_CARE_PROVIDER_SITE_OTHER): Payer: Medicare Other | Admitting: Podiatry

## 2020-02-12 DIAGNOSIS — M79675 Pain in left toe(s): Secondary | ICD-10-CM | POA: Diagnosis not present

## 2020-02-12 DIAGNOSIS — M79674 Pain in right toe(s): Secondary | ICD-10-CM

## 2020-02-12 DIAGNOSIS — Z7901 Long term (current) use of anticoagulants: Secondary | ICD-10-CM

## 2020-02-12 DIAGNOSIS — B351 Tinea unguium: Secondary | ICD-10-CM

## 2020-02-12 DIAGNOSIS — Q828 Other specified congenital malformations of skin: Secondary | ICD-10-CM

## 2020-02-13 NOTE — Progress Notes (Signed)
Subjective: 86 y.o. returns the office today for painful, elongated, thickened toenails which she cannot trim herself and calluses. Denies any redness or drainage around the nails/calluses. No new concerns. Denies any systemic complaints such as fevers, chills, nausea, vomiting.   PCP: Mosie Lukes, MD  Objective: NAD-presents with her son DP/PT pulses palpable, CRT less than 3 seconds Sensation appears to be intact with Thornell Mule monofilament Nails hypertrophic, dystrophic, elongated, brittle, discolored 10. There is tenderness overlying the nails 1-5 bilaterally. There is no surrounding erythema or drainage along the nail sites. Hyperkeratotic lesion left foot the metatarsal 5.  Upon debridement no underlying ulceration drainage no signs of infection No open lesions or pre-ulcerative lesions are identified. No pain with calf compression, swelling, warmth, erythema.  Assessment: Symptomatic onychomycosis, porokeratosis  Plan: -Treatment options including alternatives, risks, complications were discussed -Nails sharply debrided 10 without complication/bleeding. -Hyperkeratotic lesion sharply debrided x1 without any complications or bleeding. -Discussed daily foot inspection. If there are any changes, to call the office immediately.  -Follow-up in 3 months or sooner if any problems are to arise. In the meantime, encouraged to call the office with any questions, concerns, changes symptoms.  Celesta Gentile, DPM

## 2020-02-26 ENCOUNTER — Ambulatory Visit (INDEPENDENT_AMBULATORY_CARE_PROVIDER_SITE_OTHER): Payer: Medicare Other | Admitting: Family Medicine

## 2020-02-26 ENCOUNTER — Other Ambulatory Visit: Payer: Self-pay

## 2020-02-26 ENCOUNTER — Encounter: Payer: Self-pay | Admitting: Family Medicine

## 2020-02-26 VITALS — BP 102/58 | HR 64 | Temp 97.8°F

## 2020-02-26 DIAGNOSIS — Z23 Encounter for immunization: Secondary | ICD-10-CM | POA: Diagnosis not present

## 2020-02-26 DIAGNOSIS — E559 Vitamin D deficiency, unspecified: Secondary | ICD-10-CM

## 2020-02-26 DIAGNOSIS — I639 Cerebral infarction, unspecified: Secondary | ICD-10-CM

## 2020-02-26 DIAGNOSIS — R739 Hyperglycemia, unspecified: Secondary | ICD-10-CM | POA: Diagnosis not present

## 2020-02-26 DIAGNOSIS — Z9181 History of falling: Secondary | ICD-10-CM | POA: Diagnosis not present

## 2020-02-26 DIAGNOSIS — N289 Disorder of kidney and ureter, unspecified: Secondary | ICD-10-CM | POA: Diagnosis not present

## 2020-02-26 DIAGNOSIS — M8949 Other hypertrophic osteoarthropathy, multiple sites: Secondary | ICD-10-CM | POA: Diagnosis not present

## 2020-02-26 DIAGNOSIS — M159 Polyosteoarthritis, unspecified: Secondary | ICD-10-CM

## 2020-02-26 DIAGNOSIS — R2681 Unsteadiness on feet: Secondary | ICD-10-CM

## 2020-02-26 DIAGNOSIS — E7800 Pure hypercholesterolemia, unspecified: Secondary | ICD-10-CM | POA: Diagnosis not present

## 2020-02-26 DIAGNOSIS — R5381 Other malaise: Secondary | ICD-10-CM | POA: Diagnosis not present

## 2020-02-26 DIAGNOSIS — I10 Essential (primary) hypertension: Secondary | ICD-10-CM | POA: Diagnosis not present

## 2020-02-26 NOTE — Patient Instructions (Addendum)
Add Benefiber daily mixed in beverage or food Probiotic multistrain daily, [place] or Phillip's capsule  [employer] Hypertension, Adult High blood pressure (hypertension) is when the force of blood pumping through the arteries is too strong. The arteries are the blood vessels that carry blood from the heart throughout the body. Hypertension forces the heart to work harder to pump blood and may cause arteries to become narrow or stiff. Untreated or uncontrolled hypertension can cause a heart attack, heart failure, a stroke, kidney disease, and other problems. A blood pressure reading consists of a higher number over a lower number. Ideally, your blood pressure should be below 120/80. The first ("top") number is called the systolic pressure. It is a measure of the pressure in your arteries as your heart beats. The second ("bottom") number is called the diastolic pressure. It is a measure of the pressure in your arteries as the heart relaxes. What are the causes? The exact cause of this condition is not known. There are some conditions that result in or are related to high blood pressure. What increases the risk? Some risk factors for high blood pressure are under your control. The following factors may make you more likely to develop this condition:  Smoking.  Having type 2 diabetes mellitus, high cholesterol, or both.  Not getting enough exercise or physical activity.  Being overweight.  Having too much fat, sugar, calories, or salt (sodium) in your diet.  Drinking too much alcohol. Some risk factors for high blood pressure may be difficult or impossible to change. Some of these factors include:  Having chronic kidney disease.  Having a family history of high blood pressure.  Age. Risk increases with age.  Race. You may be at higher risk if you are African American.  Gender. Men are at higher risk than women before age 15. After age 47, women are at higher risk than  men.  Having obstructive sleep apnea.  Stress. What are the signs or symptoms? High blood pressure may not cause symptoms. Very high blood pressure (hypertensive crisis) may cause:  Headache.  Anxiety.  Shortness of breath.  Nosebleed.  Nausea and vomiting.  Vision changes.  Severe chest pain.  Seizures. How is this diagnosed? This condition is diagnosed by measuring your blood pressure while you are seated, with your arm resting on a flat surface, your legs uncrossed, and your feet flat on the floor. The cuff of the blood pressure monitor will be placed directly against the skin of your upper arm at the level of your heart. It should be measured at least twice using the same arm. Certain conditions can cause a difference in blood pressure between your right and left arms. Certain factors can cause blood pressure readings to be lower or higher than normal for a short period of time:  When your blood pressure is higher when you are in a health care provider's office than when you are at home, this is called white coat hypertension. Most people with this condition do not need medicines.  When your blood pressure is higher at home than when you are in a health care provider's office, this is called masked hypertension. Most people with this condition may need medicines to control blood pressure. If you have a high blood pressure reading during one visit or you have normal blood pressure with other risk factors, you may be asked to:  Return on a different day to have your blood pressure checked again.  Monitor your blood pressure  at home for 1 week or longer. If you are diagnosed with hypertension, you may have other blood or imaging tests to help your health care provider understand your overall risk for other conditions. How is this treated? This condition is treated by making healthy lifestyle changes, such as eating healthy foods, exercising more, and reducing your alcohol  intake. Your health care provider may prescribe medicine if lifestyle changes are not enough to get your blood pressure under control, and if:  Your systolic blood pressure is above 130.  Your diastolic blood pressure is above 80. Your personal target blood pressure may vary depending on your medical conditions, your age, and other factors. Follow these instructions at home: Eating and drinking   Eat a diet that is high in fiber and potassium, and low in sodium, added sugar, and fat. An example eating plan is called the DASH (Dietary Approaches to Stop Hypertension) diet. To eat this way: ? Eat plenty of fresh fruits and vegetables. Try to fill one half of your plate at each meal with fruits and vegetables. ? Eat whole grains, such as whole-wheat pasta, brown rice, or whole-grain bread. Fill about one fourth of your plate with whole grains. ? Eat or drink low-fat dairy products, such as skim milk or low-fat yogurt. ? Avoid fatty cuts of meat, processed or cured meats, and poultry with skin. Fill about one fourth of your plate with lean proteins, such as fish, chicken without skin, beans, eggs, or tofu. ? Avoid pre-made and processed foods. These tend to be higher in sodium, added sugar, and fat.  Reduce your daily sodium intake. Most people with hypertension should eat less than 1,500 mg of sodium a day.  Do not drink alcohol if: ? Your health care provider tells you not to drink. ? You are pregnant, may be pregnant, or are planning to become pregnant.  If you drink alcohol: ? Limit how much you use to:  0-1 drink a day for women.  0-2 drinks a day for men. ? Be aware of how much alcohol is in your drink. In the [place] one drink equals one 12 oz bottle of beer (355 mL), one 5 oz glass of wine (148 mL), or one 1 oz glass of hard liquor (44 mL). Lifestyle   Work with your health care provider to maintain a healthy body weight or to lose weight. Ask what an ideal weight is for  you.  Get at least 30 minutes of exercise most days of the week. Activities may include walking, swimming, or biking.  Include exercise to strengthen your muscles (resistance exercise), such as Pilates or lifting weights, as part of your weekly exercise routine. Try to do these types of exercises for 30 minutes at least 3 days a week.  Do not use any products that contain nicotine or tobacco, such as cigarettes, e-cigarettes, and chewing tobacco. If you need help quitting, ask your health care provider.  Monitor your blood pressure at home as told by your health care provider.  Keep all follow-up visits as told by your health care provider. This is important. Medicines  Take over-the-counter and prescription medicines only as told by your health care provider. Follow directions carefully. Blood pressure medicines must be taken as prescribed.  Do not skip doses of blood pressure medicine. Doing this puts you at risk for problems and can make the medicine less effective.  Ask your health care provider about side effects or reactions to medicines that you should watch  for. Contact a health care provider if you:  Think you are having a reaction to a medicine you are taking.  Have headaches that keep coming back (recurring).  Feel dizzy.  Have swelling in your ankles.  Have trouble with your vision. Get help right away if you:  Develop a severe headache or confusion.  Have unusual weakness or numbness.  Feel faint.  Have severe pain in your chest or abdomen.  Vomit repeatedly.  Have trouble breathing. Summary  Hypertension is when the force of blood pumping through your arteries is too strong. If this condition is not controlled, it may put you at risk for serious complications.  Your personal target blood pressure may vary depending on your medical conditions, your age, and other factors. For most people, a normal blood pressure is less than 120/80.  Hypertension is  treated with lifestyle changes, medicines, or a combination of both. Lifestyle changes include losing weight, eating a healthy, low-sodium diet, exercising more, and limiting alcohol. This information is not intended to replace advice given to you by your health care provider. Make sure you discuss any questions you have with your health care provider. Document Revised: 01/11/2018 Document Reviewed: 01/11/2018 Elsevier Patient Education  2020 [employer].

## 2020-02-27 NOTE — Assessment & Plan Note (Signed)
Supplement and monitor 

## 2020-02-27 NOTE — Assessment & Plan Note (Signed)
hgba1c acceptable, minimize simple carbs. Increase exercise as tolerated.  

## 2020-02-27 NOTE — Assessment & Plan Note (Signed)
She is less mobile and weaker. After discussion will refer for home health PT course to try and improve her strength and mobility

## 2020-02-27 NOTE — Progress Notes (Signed)
Subjective:    Patient ID: Holly Nunez, female    DOB: Jun 11, 1924, 86 y.o.   MRN: [id number]  Chief Complaint  Patient presents with  . Follow-up    flu shot    HPI Patient is in today for follow up on chronic medical concerns. She is accompanied by her son who is her primary care provider. No recent febrile illness or hospitalizations. She does not eat much but eats OK when she eats. They note she has become less mobile and weaker. No recent falls or trauma. Denies CP/palp/SOB/HA/congestion/fevers/GI or GU c/o. Taking meds as prescribed. They do note hearing loss  Past Medical History:  Diagnosis Date  . Arthritis   . Estrogen deficiency 05/27/2016  . Great toe pain, left 12/02/2016  . H/O measles   . H/O mumps   . History of chicken pox   . Hyperglycemia 05/27/2016  . Hyperlipidemia   . Hypertension   . Patent foramen ovale   . Preventative health care 02/26/2015  . Renal insufficiency   . Thyroid nodule    Biopsy-negative  . TIA (transient ischemic attack)   . Urinary incontinence     Past Surgical History:  Procedure Laterality Date  . ABDOMINAL HYSTERECTOMY     fibroid  . APPENDECTOMY    . EYE SURGERY Bilateral    CATARACT REMOVAL  . JOINT REPLACEMENT    . REPLACEMENT TOTAL KNEE Right 2007  . THYROID LOBECTOMY Left 04/25/2015  . THYROID LOBECTOMY Left 04/25/2015   Procedure: LEFT THYROID LOBECTOMY;  Surgeon: Armandina Gemma, MD;  Location: [facility];  Service: General;  Laterality: Left;  . TRIGGER FINGER RELEASE  2007    Family History  Problem Relation Age of Onset  . Heart disease Mother   . Hypertension Mother   . Kidney disease Sister   . Mental illness Sister        suicide, depresion  . Gout Brother   . Hypertension Son   . Cancer Maternal Grandmother   . Cancer Sister   . Bone cancer Sister   . Cancer Sister   . Cancer Sister        kidney  . Cancer Daughter   . Pancreatic cancer Daughter   . Thyroid disease Daughter   . Thyroid disease  Daughter        thyroid nodules  . Hypertension Daughter   . Irritable bowel syndrome Daughter   . Cancer Daughter        thyroid  . Cancer Brother   . Prostate cancer Brother     Social History   Socioeconomic History  . Marital status: Widowed    Spouse name: Not on file  . Number of children: 4  . Years of education: Not on file  . Highest education level: Not on file  Occupational History  . Not on file  Tobacco Use  . Smoking status: Never Smoker  . Smokeless tobacco: Never Used  Substance and Sexual Activity  . Alcohol use: No  . Drug use: No  . Sexual activity: Not Currently    Partners: Male    Comment: no dietary restrictions, lives with 2 daughters  Other Topics Concern  . Not on file  Social History Narrative   No dietary restrictions and lives with daughters.   Social Determinants of Health   Financial Resource Strain: Low Risk   . Difficulty of Paying Living Expenses: Not hard at all  Food Insecurity: No Food Insecurity  . Worried About Estate manager/land agent  of Food in the Last Year: Never true  . Ran Out of Food in the Last Year: Never true  Transportation Needs: No Transportation Needs  . Lack of Transportation (Medical): No  . Lack of Transportation (Non-Medical): No  Physical Activity:   . Days of Exercise per Week: Not on file  . Minutes of Exercise per Session: Not on file  Stress:   . Feeling of Stress : Not on file  Social Connections:   . Frequency of Communication with Friends and Family: Not on file  . Frequency of Social Gatherings with Friends and Family: Not on file  . Attends Religious Services: Not on file  . Active Member of Clubs or Organizations: Not on file  . Attends Archivist Meetings: Not on file  . Marital Status: Not on file  Intimate Partner Violence:   . Fear of Current or Ex-Partner: Not on file  . Emotionally Abused: Not on file  . Physically Abused: Not on file  . Sexually Abused: Not on file    Outpatient  Medications Prior to Visit  Medication Sig Dispense Refill  . acetaminophen (TYLENOL) 650 MG CR tablet Take 1,300 mg by mouth every 8 (eight) hours as needed for pain.    Marland Kitchen atorvastatin (LIPITOR) 40 MG tablet TAKE 1 TABLET(40 MG) BY MOUTH DAILY 90 tablet 1  . carvedilol (COREG) 3.125 MG tablet TAKE 2 TABLETS(6.25 MG) BY MOUTH TWICE DAILY 360 tablet 1  . famotidine (PEPCID) 40 MG tablet Take 1 tablet (40 mg total) by mouth at bedtime as needed for heartburn or indigestion. 30 tablet 5  . oxybutynin (DITROPAN-XL) 10 MG 24 hr tablet TAKE 1 TABLET(10 MG) BY MOUTH AT BEDTIME 90 tablet 1  . Rivaroxaban (XARELTO) 15 MG TABS tablet Take 1 tablet (15 mg total) by mouth daily with supper. 30 tablet 5  . timolol (TIMOPTIC) 0.5 % ophthalmic solution Place 1 drop into both eyes at bedtime.  6  . triamterene-hydrochlorothiazide (MAXZIDE-25) 37.5-25 MG tablet TAKE 1 TABLET BY MOUTH DAILY 90 tablet 1  . famotidine (PEPCID) 40 MG tablet TAKE 1 TABLET(40 MG) BY MOUTH DAILY 30 tablet 5  . nitroGLYCERIN (NITROSTAT) 0.4 MG SL tablet Place 1 tablet (0.4 mg total) under the tongue every 5 (five) minutes as needed for chest pain. 25 tablet 11   No facility-administered medications prior to visit.    Allergies  Allergen Reactions  . Alphagan [Brimonidine] Other (See Comments)    Secondary infection and vision changes    Review of Systems  Constitutional: Positive for malaise/fatigue. Negative for chills and fever.  HENT: Positive for hearing loss. Negative for congestion.   Eyes: Negative for discharge.  Respiratory: Negative for cough, sputum production and shortness of breath.   Cardiovascular: Negative for chest pain, palpitations and leg swelling.  Gastrointestinal: Negative for abdominal pain, blood in stool, constipation, diarrhea, heartburn, nausea and vomiting.  Genitourinary: Negative for dysuria, frequency, hematuria and urgency.  Musculoskeletal: Negative for back pain, falls and myalgias.  Skin:  Negative for rash.  Neurological: Positive for weakness. Negative for dizziness, sensory change, loss of consciousness and headaches.  Endo/Heme/Allergies: Negative for environmental allergies. Does not bruise/bleed easily.  Psychiatric/Behavioral: Negative for depression and suicidal ideas. The patient is not nervous/anxious and does not have insomnia.        Objective:    Physical Exam Constitutional:      General: She is not in acute distress.    Appearance: She is well-developed.  HENT:  Head: Normocephalic and atraumatic.  Eyes:     Conjunctiva/sclera: Conjunctivae normal.  Neck:     Thyroid: No thyromegaly.  Cardiovascular:     Rate and Rhythm: Normal rate and regular rhythm.     Heart sounds: Normal heart sounds. No murmur heard.   Pulmonary:     Effort: Pulmonary effort is normal. No respiratory distress.     Breath sounds: Normal breath sounds.  Abdominal:     General: Bowel sounds are normal. There is no distension.     Palpations: Abdomen is soft. There is no mass.     Tenderness: There is no abdominal tenderness.  Musculoskeletal:     Cervical back: Neck supple.  Lymphadenopathy:     Cervical: No cervical adenopathy.  Skin:    General: Skin is warm and dry.  Neurological:     Mental Status: She is alert and oriented to person, place, and time.  Psychiatric:        Behavior: Behavior normal.     BP (!) 102/58 (BP Location: Left Arm, Patient Position: Sitting, Cuff Size: Normal)   Pulse 64   Temp 97.8 F (36.6 C)   SpO2 97%  Wt Readings from Last 3 Encounters:  10/11/19 128 lb (58.1 kg)  08/21/19 133 lb (60.3 kg)  08/21/19 133 lb 3.2 oz (60.4 kg)    Diabetic Foot Exam - Simple   No data filed     Lab Results  Component Value Date   WBC 5.0 08/21/2019   HGB 12.0 08/21/2019   HCT 35.4 (L) 08/21/2019   PLT 174.0 08/21/2019   GLUCOSE 118 (H) 08/21/2019   CHOL 141 08/21/2019   TRIG 78.0 08/21/2019   HDL 81.30 08/21/2019   LDLCALC 45  08/21/2019   ALT 19 08/21/2019   AST 21 08/21/2019   NA 138 08/21/2019   K 4.5 08/21/2019   CL 101 08/21/2019   CREATININE 1.43 (H) 08/21/2019   BUN 25 (H) 08/21/2019   CO2 29 08/21/2019   TSH 1.62 08/21/2019   INR 1.13 04/25/2015   HGBA1C 5.5 08/21/2019   MICROALBUR 2.2 (H) 01/23/2018    Lab Results  Component Value Date   TSH 1.62 08/21/2019   Lab Results  Component Value Date   WBC 5.0 08/21/2019   HGB 12.0 08/21/2019   HCT 35.4 (L) 08/21/2019   MCV 96.6 08/21/2019   PLT 174.0 08/21/2019   Lab Results  Component Value Date   NA 138 08/21/2019   K 4.5 08/21/2019   CO2 29 08/21/2019   GLUCOSE 118 (H) 08/21/2019   BUN 25 (H) 08/21/2019   CREATININE 1.43 (H) 08/21/2019   BILITOT 0.8 08/21/2019   ALKPHOS 56 08/21/2019   AST 21 08/21/2019   ALT 19 08/21/2019   PROT 7.1 08/21/2019   ALBUMIN 4.1 08/21/2019   CALCIUM 10.2 08/21/2019   ANIONGAP 13 02/06/2019   GFR 41.48 (L) 08/21/2019   Lab Results  Component Value Date   CHOL 141 08/21/2019   Lab Results  Component Value Date   HDL 81.30 08/21/2019   Lab Results  Component Value Date   LDLCALC 45 08/21/2019   Lab Results  Component Value Date   TRIG 78.0 08/21/2019   Lab Results  Component Value Date   CHOLHDL 2 08/21/2019   Lab Results  Component Value Date   HGBA1C 5.5 08/21/2019       Assessment & Plan:   Problem List Items Addressed This Visit    HTN (hypertension)  Well controlled, no changes to meds. Encouraged heart healthy diet such as the DASH diet and exercise as tolerated.       Relevant Orders   CBC   Comprehensive metabolic panel   TSH   Hyperlipidemia    Tolerating statin, encouraged heart healthy diet, avoid trans fats, minimize simple carbs and saturated fats. Increase exercise as tolerated      Relevant Orders   Lipid panel   DJD (degenerative joint disease)   Renal insufficiency    Hydrate and monitor      Hyperglycemia    hgba1c acceptable, minimize simple  carbs. Increase exercise as tolerated.       Relevant Orders   Hemoglobin A1c   Vitamin D deficiency    Supplement and monitor      Relevant Orders   VITAMIN D 25 Hydroxy (Vit-D Deficiency, Fractures)   Debility    She is less mobile and weaker. After discussion will refer for home health PT course to try and improve her strength and mobility       Other Visit Diagnoses    Influenza vaccine administered    -  Primary   Relevant Orders   Flu Vaccine QUAD High Dose(Fluad) (Completed)   Unsteady gait       Relevant Orders   Ambulatory referral to [place]   At high risk for falls       Relevant Orders   Ambulatory referral to [place] have changed Pamala Hurry Creer's famotidine. I am also having her maintain her acetaminophen, timolol, nitroGLYCERIN, carvedilol, Rivaroxaban, atorvastatin, triamterene-hydrochlorothiazide, and oxybutynin.  No orders of the defined types were placed in this encounter.    Penni Homans, MD

## 2020-02-27 NOTE — Assessment & Plan Note (Signed)
Hydrate and monitor 

## 2020-02-27 NOTE — Assessment & Plan Note (Signed)
Well controlled, no changes to meds. Encouraged heart healthy diet such as the DASH diet and exercise as tolerated.  °

## 2020-02-27 NOTE — Assessment & Plan Note (Signed)
Tolerating statin, encouraged heart healthy diet, avoid trans fats, minimize simple carbs and saturated fats. Increase exercise as tolerated 

## 2020-03-04 ENCOUNTER — Other Ambulatory Visit: Payer: Medicare Other

## 2020-03-04 ENCOUNTER — Other Ambulatory Visit: Payer: Self-pay

## 2020-03-04 DIAGNOSIS — R739 Hyperglycemia, unspecified: Secondary | ICD-10-CM

## 2020-03-04 DIAGNOSIS — I10 Essential (primary) hypertension: Secondary | ICD-10-CM | POA: Diagnosis not present

## 2020-03-04 DIAGNOSIS — E559 Vitamin D deficiency, unspecified: Secondary | ICD-10-CM

## 2020-03-04 DIAGNOSIS — E7800 Pure hypercholesterolemia, unspecified: Secondary | ICD-10-CM

## 2020-03-05 DIAGNOSIS — E7800 Pure hypercholesterolemia, unspecified: Secondary | ICD-10-CM | POA: Diagnosis not present

## 2020-03-05 DIAGNOSIS — I10 Essential (primary) hypertension: Secondary | ICD-10-CM | POA: Diagnosis not present

## 2020-03-05 DIAGNOSIS — M1380 Other specified arthritis, unspecified site: Secondary | ICD-10-CM | POA: Diagnosis not present

## 2020-03-05 DIAGNOSIS — M150 Primary generalized (osteo)arthritis: Secondary | ICD-10-CM | POA: Diagnosis not present

## 2020-03-05 DIAGNOSIS — R32 Unspecified urinary incontinence: Secondary | ICD-10-CM | POA: Diagnosis not present

## 2020-03-05 DIAGNOSIS — E559 Vitamin D deficiency, unspecified: Secondary | ICD-10-CM | POA: Diagnosis not present

## 2020-03-05 DIAGNOSIS — N289 Disorder of kidney and ureter, unspecified: Secondary | ICD-10-CM | POA: Diagnosis not present

## 2020-03-05 DIAGNOSIS — Z8673 Personal history of transient ischemic attack (TIA), and cerebral infarction without residual deficits: Secondary | ICD-10-CM | POA: Diagnosis not present

## 2020-03-05 DIAGNOSIS — Z9181 History of falling: Secondary | ICD-10-CM | POA: Diagnosis not present

## 2020-03-05 DIAGNOSIS — Z7901 Long term (current) use of anticoagulants: Secondary | ICD-10-CM | POA: Diagnosis not present

## 2020-03-05 LAB — COMPREHENSIVE METABOLIC PANEL
AG Ratio: 1.4 (calc) (ref 1.0–2.5)
ALT: 19 U/L (ref 6–29)
AST: 23 U/L (ref 10–35)
Albumin: 3.9 g/dL (ref 3.6–5.1)
Alkaline phosphatase (APISO): 58 U/L (ref 37–153)
BUN/Creatinine Ratio: 17 (calc) (ref 6–22)
BUN: 22 mg/dL (ref 7–25)
CO2: 26 mmol/L (ref 20–32)
Calcium: 10.1 mg/dL (ref 8.6–10.4)
Chloride: 95 mmol/L — ABNORMAL LOW (ref 98–110)
Creat: 1.27 mg/dL — ABNORMAL HIGH (ref 0.60–0.88)
Globulin: 2.8 g/dL (calc) (ref 1.9–3.7)
Glucose, Bld: 110 mg/dL — ABNORMAL HIGH (ref 65–99)
Potassium: 4 mmol/L (ref 3.5–5.3)
Sodium: 134 mmol/L — ABNORMAL LOW (ref 135–146)
Total Bilirubin: 0.9 mg/dL (ref 0.2–1.2)
Total Protein: 6.7 g/dL (ref 6.1–8.1)

## 2020-03-05 LAB — HEMOGLOBIN A1C
Hgb A1c MFr Bld: 5.7 % of total Hgb — ABNORMAL HIGH (ref <5.7)
Mean Plasma Glucose: 117 (calc)
eAG (mmol/L): 6.5 (calc)

## 2020-03-05 LAB — VITAMIN D 25 HYDROXY (VIT D DEFICIENCY, FRACTURES): Vit D, 25-Hydroxy: 47 ng/mL (ref 30–100)

## 2020-03-05 LAB — CBC
HCT: 36.2 % (ref 35.0–45.0)
Hemoglobin: 11.8 g/dL (ref 11.7–15.5)
MCH: 31.5 pg (ref 27.0–33.0)
MCHC: 32.6 g/dL (ref 32.0–36.0)
MCV: 96.5 fL (ref 80.0–100.0)
MPV: 10.2 fL (ref 7.5–12.5)
Platelets: 178 10*3/uL (ref 140–400)
RBC: 3.75 10*6/uL — ABNORMAL LOW (ref 3.80–5.10)
RDW: 12.3 % (ref 11.0–15.0)
WBC: 4.9 10*3/uL (ref 3.8–10.8)

## 2020-03-05 LAB — LIPID PANEL
Cholesterol: 142 mg/dL (ref <200)
HDL: 69 mg/dL (ref >=50)
LDL Cholesterol (Calc): 59 mg/dL (calc)
Non-HDL Cholesterol (Calc): 73 mg/dL (calc) (ref <130)
Total CHOL/HDL Ratio: 2.1 (calc) (ref <5.0)
Triglycerides: 65 mg/dL (ref <150)

## 2020-03-05 LAB — TSH: TSH: 2.41 mIU/L (ref 0.40–4.50)

## 2020-03-10 DIAGNOSIS — R32 Unspecified urinary incontinence: Secondary | ICD-10-CM | POA: Diagnosis not present

## 2020-03-10 DIAGNOSIS — E7800 Pure hypercholesterolemia, unspecified: Secondary | ICD-10-CM | POA: Diagnosis not present

## 2020-03-10 DIAGNOSIS — M150 Primary generalized (osteo)arthritis: Secondary | ICD-10-CM | POA: Diagnosis not present

## 2020-03-10 DIAGNOSIS — N289 Disorder of kidney and ureter, unspecified: Secondary | ICD-10-CM | POA: Diagnosis not present

## 2020-03-10 DIAGNOSIS — E559 Vitamin D deficiency, unspecified: Secondary | ICD-10-CM | POA: Diagnosis not present

## 2020-03-10 DIAGNOSIS — I10 Essential (primary) hypertension: Secondary | ICD-10-CM | POA: Diagnosis not present

## 2020-03-12 DIAGNOSIS — M150 Primary generalized (osteo)arthritis: Secondary | ICD-10-CM | POA: Diagnosis not present

## 2020-03-12 DIAGNOSIS — E7800 Pure hypercholesterolemia, unspecified: Secondary | ICD-10-CM | POA: Diagnosis not present

## 2020-03-12 DIAGNOSIS — R32 Unspecified urinary incontinence: Secondary | ICD-10-CM | POA: Diagnosis not present

## 2020-03-12 DIAGNOSIS — I10 Essential (primary) hypertension: Secondary | ICD-10-CM | POA: Diagnosis not present

## 2020-03-12 DIAGNOSIS — E559 Vitamin D deficiency, unspecified: Secondary | ICD-10-CM | POA: Diagnosis not present

## 2020-03-12 DIAGNOSIS — N289 Disorder of kidney and ureter, unspecified: Secondary | ICD-10-CM | POA: Diagnosis not present

## 2020-03-19 DIAGNOSIS — E7800 Pure hypercholesterolemia, unspecified: Secondary | ICD-10-CM | POA: Diagnosis not present

## 2020-03-19 DIAGNOSIS — I10 Essential (primary) hypertension: Secondary | ICD-10-CM | POA: Diagnosis not present

## 2020-03-19 DIAGNOSIS — E559 Vitamin D deficiency, unspecified: Secondary | ICD-10-CM | POA: Diagnosis not present

## 2020-03-19 DIAGNOSIS — N289 Disorder of kidney and ureter, unspecified: Secondary | ICD-10-CM | POA: Diagnosis not present

## 2020-03-19 DIAGNOSIS — M150 Primary generalized (osteo)arthritis: Secondary | ICD-10-CM | POA: Diagnosis not present

## 2020-03-19 DIAGNOSIS — R32 Unspecified urinary incontinence: Secondary | ICD-10-CM | POA: Diagnosis not present

## 2020-03-20 DIAGNOSIS — E7800 Pure hypercholesterolemia, unspecified: Secondary | ICD-10-CM | POA: Diagnosis not present

## 2020-03-20 DIAGNOSIS — E559 Vitamin D deficiency, unspecified: Secondary | ICD-10-CM | POA: Diagnosis not present

## 2020-03-20 DIAGNOSIS — I10 Essential (primary) hypertension: Secondary | ICD-10-CM | POA: Diagnosis not present

## 2020-03-20 DIAGNOSIS — R32 Unspecified urinary incontinence: Secondary | ICD-10-CM | POA: Diagnosis not present

## 2020-03-20 DIAGNOSIS — N289 Disorder of kidney and ureter, unspecified: Secondary | ICD-10-CM | POA: Diagnosis not present

## 2020-03-20 DIAGNOSIS — M150 Primary generalized (osteo)arthritis: Secondary | ICD-10-CM | POA: Diagnosis not present

## 2020-03-21 DIAGNOSIS — M150 Primary generalized (osteo)arthritis: Secondary | ICD-10-CM | POA: Diagnosis not present

## 2020-03-21 DIAGNOSIS — N289 Disorder of kidney and ureter, unspecified: Secondary | ICD-10-CM | POA: Diagnosis not present

## 2020-03-21 DIAGNOSIS — E7800 Pure hypercholesterolemia, unspecified: Secondary | ICD-10-CM | POA: Diagnosis not present

## 2020-03-21 DIAGNOSIS — R32 Unspecified urinary incontinence: Secondary | ICD-10-CM | POA: Diagnosis not present

## 2020-03-21 DIAGNOSIS — I10 Essential (primary) hypertension: Secondary | ICD-10-CM | POA: Diagnosis not present

## 2020-03-21 DIAGNOSIS — E559 Vitamin D deficiency, unspecified: Secondary | ICD-10-CM | POA: Diagnosis not present

## 2020-03-25 ENCOUNTER — Other Ambulatory Visit: Payer: Self-pay | Admitting: Cardiology

## 2020-03-25 ENCOUNTER — Other Ambulatory Visit: Payer: Self-pay | Admitting: Family Medicine

## 2020-03-25 DIAGNOSIS — R32 Unspecified urinary incontinence: Secondary | ICD-10-CM | POA: Diagnosis not present

## 2020-03-25 DIAGNOSIS — M150 Primary generalized (osteo)arthritis: Secondary | ICD-10-CM | POA: Diagnosis not present

## 2020-03-25 DIAGNOSIS — N289 Disorder of kidney and ureter, unspecified: Secondary | ICD-10-CM | POA: Diagnosis not present

## 2020-03-25 DIAGNOSIS — E559 Vitamin D deficiency, unspecified: Secondary | ICD-10-CM | POA: Diagnosis not present

## 2020-03-25 DIAGNOSIS — E7800 Pure hypercholesterolemia, unspecified: Secondary | ICD-10-CM | POA: Diagnosis not present

## 2020-03-25 DIAGNOSIS — I10 Essential (primary) hypertension: Secondary | ICD-10-CM | POA: Diagnosis not present

## 2020-03-26 DIAGNOSIS — I10 Essential (primary) hypertension: Secondary | ICD-10-CM | POA: Diagnosis not present

## 2020-03-26 DIAGNOSIS — M150 Primary generalized (osteo)arthritis: Secondary | ICD-10-CM | POA: Diagnosis not present

## 2020-03-26 DIAGNOSIS — R32 Unspecified urinary incontinence: Secondary | ICD-10-CM | POA: Diagnosis not present

## 2020-03-26 DIAGNOSIS — E559 Vitamin D deficiency, unspecified: Secondary | ICD-10-CM | POA: Diagnosis not present

## 2020-03-26 DIAGNOSIS — E7800 Pure hypercholesterolemia, unspecified: Secondary | ICD-10-CM | POA: Diagnosis not present

## 2020-03-26 DIAGNOSIS — N289 Disorder of kidney and ureter, unspecified: Secondary | ICD-10-CM | POA: Diagnosis not present

## 2020-03-27 DIAGNOSIS — M150 Primary generalized (osteo)arthritis: Secondary | ICD-10-CM | POA: Diagnosis not present

## 2020-03-27 DIAGNOSIS — E559 Vitamin D deficiency, unspecified: Secondary | ICD-10-CM | POA: Diagnosis not present

## 2020-03-27 DIAGNOSIS — I10 Essential (primary) hypertension: Secondary | ICD-10-CM | POA: Diagnosis not present

## 2020-03-27 DIAGNOSIS — E7800 Pure hypercholesterolemia, unspecified: Secondary | ICD-10-CM | POA: Diagnosis not present

## 2020-03-27 DIAGNOSIS — R32 Unspecified urinary incontinence: Secondary | ICD-10-CM | POA: Diagnosis not present

## 2020-03-27 DIAGNOSIS — N289 Disorder of kidney and ureter, unspecified: Secondary | ICD-10-CM | POA: Diagnosis not present

## 2020-04-01 DIAGNOSIS — R32 Unspecified urinary incontinence: Secondary | ICD-10-CM | POA: Diagnosis not present

## 2020-04-01 DIAGNOSIS — E559 Vitamin D deficiency, unspecified: Secondary | ICD-10-CM | POA: Diagnosis not present

## 2020-04-01 DIAGNOSIS — N289 Disorder of kidney and ureter, unspecified: Secondary | ICD-10-CM | POA: Diagnosis not present

## 2020-04-01 DIAGNOSIS — E7800 Pure hypercholesterolemia, unspecified: Secondary | ICD-10-CM | POA: Diagnosis not present

## 2020-04-01 DIAGNOSIS — I10 Essential (primary) hypertension: Secondary | ICD-10-CM | POA: Diagnosis not present

## 2020-04-01 DIAGNOSIS — M150 Primary generalized (osteo)arthritis: Secondary | ICD-10-CM | POA: Diagnosis not present

## 2020-04-02 DIAGNOSIS — E559 Vitamin D deficiency, unspecified: Secondary | ICD-10-CM | POA: Diagnosis not present

## 2020-04-02 DIAGNOSIS — N289 Disorder of kidney and ureter, unspecified: Secondary | ICD-10-CM | POA: Diagnosis not present

## 2020-04-02 DIAGNOSIS — M150 Primary generalized (osteo)arthritis: Secondary | ICD-10-CM | POA: Diagnosis not present

## 2020-04-02 DIAGNOSIS — I10 Essential (primary) hypertension: Secondary | ICD-10-CM | POA: Diagnosis not present

## 2020-04-02 DIAGNOSIS — E7800 Pure hypercholesterolemia, unspecified: Secondary | ICD-10-CM | POA: Diagnosis not present

## 2020-04-02 DIAGNOSIS — R32 Unspecified urinary incontinence: Secondary | ICD-10-CM | POA: Diagnosis not present

## 2020-04-04 DIAGNOSIS — Z7901 Long term (current) use of anticoagulants: Secondary | ICD-10-CM | POA: Diagnosis not present

## 2020-04-04 DIAGNOSIS — Z8673 Personal history of transient ischemic attack (TIA), and cerebral infarction without residual deficits: Secondary | ICD-10-CM | POA: Diagnosis not present

## 2020-04-04 DIAGNOSIS — R32 Unspecified urinary incontinence: Secondary | ICD-10-CM | POA: Diagnosis not present

## 2020-04-04 DIAGNOSIS — M1380 Other specified arthritis, unspecified site: Secondary | ICD-10-CM | POA: Diagnosis not present

## 2020-04-04 DIAGNOSIS — I10 Essential (primary) hypertension: Secondary | ICD-10-CM | POA: Diagnosis not present

## 2020-04-04 DIAGNOSIS — E7800 Pure hypercholesterolemia, unspecified: Secondary | ICD-10-CM | POA: Diagnosis not present

## 2020-04-04 DIAGNOSIS — E559 Vitamin D deficiency, unspecified: Secondary | ICD-10-CM | POA: Diagnosis not present

## 2020-04-04 DIAGNOSIS — Z9181 History of falling: Secondary | ICD-10-CM | POA: Diagnosis not present

## 2020-04-04 DIAGNOSIS — N289 Disorder of kidney and ureter, unspecified: Secondary | ICD-10-CM | POA: Diagnosis not present

## 2020-04-04 DIAGNOSIS — M150 Primary generalized (osteo)arthritis: Secondary | ICD-10-CM | POA: Diagnosis not present

## 2020-04-08 DIAGNOSIS — M150 Primary generalized (osteo)arthritis: Secondary | ICD-10-CM | POA: Diagnosis not present

## 2020-04-08 DIAGNOSIS — R32 Unspecified urinary incontinence: Secondary | ICD-10-CM | POA: Diagnosis not present

## 2020-04-08 DIAGNOSIS — I10 Essential (primary) hypertension: Secondary | ICD-10-CM | POA: Diagnosis not present

## 2020-04-08 DIAGNOSIS — E559 Vitamin D deficiency, unspecified: Secondary | ICD-10-CM | POA: Diagnosis not present

## 2020-04-08 DIAGNOSIS — E7800 Pure hypercholesterolemia, unspecified: Secondary | ICD-10-CM | POA: Diagnosis not present

## 2020-04-08 DIAGNOSIS — N289 Disorder of kidney and ureter, unspecified: Secondary | ICD-10-CM | POA: Diagnosis not present

## 2020-04-14 DIAGNOSIS — N289 Disorder of kidney and ureter, unspecified: Secondary | ICD-10-CM | POA: Diagnosis not present

## 2020-04-14 DIAGNOSIS — M150 Primary generalized (osteo)arthritis: Secondary | ICD-10-CM | POA: Diagnosis not present

## 2020-04-14 DIAGNOSIS — R32 Unspecified urinary incontinence: Secondary | ICD-10-CM | POA: Diagnosis not present

## 2020-04-14 DIAGNOSIS — I10 Essential (primary) hypertension: Secondary | ICD-10-CM | POA: Diagnosis not present

## 2020-04-14 DIAGNOSIS — E559 Vitamin D deficiency, unspecified: Secondary | ICD-10-CM | POA: Diagnosis not present

## 2020-04-14 DIAGNOSIS — E7800 Pure hypercholesterolemia, unspecified: Secondary | ICD-10-CM | POA: Diagnosis not present

## 2020-04-16 ENCOUNTER — Encounter: Payer: Self-pay | Admitting: Cardiology

## 2020-04-16 ENCOUNTER — Telehealth (INDEPENDENT_AMBULATORY_CARE_PROVIDER_SITE_OTHER): Payer: Medicare Other | Admitting: Cardiology

## 2020-04-16 VITALS — BP 124/68 | Ht 60.0 in | Wt 128.0 lb

## 2020-04-16 DIAGNOSIS — I639 Cerebral infarction, unspecified: Secondary | ICD-10-CM

## 2020-04-16 DIAGNOSIS — Z8673 Personal history of transient ischemic attack (TIA), and cerebral infarction without residual deficits: Secondary | ICD-10-CM | POA: Diagnosis not present

## 2020-04-16 DIAGNOSIS — Z7901 Long term (current) use of anticoagulants: Secondary | ICD-10-CM | POA: Diagnosis not present

## 2020-04-16 DIAGNOSIS — I10 Essential (primary) hypertension: Secondary | ICD-10-CM

## 2020-04-16 NOTE — Progress Notes (Signed)
Virtual Visit via Telephone Note   This visit type was conducted due to national recommendations for restrictions regarding the COVID-19 Pandemic (e.g. social distancing) in an effort to limit this patient's exposure and mitigate transmission in our community.  Due to her co-morbid illnesses, this patient is at least at moderate risk for complications without adequate follow up.  This format is felt to be most appropriate for this patient at this time.  The patient did not have access to video technology/had technical difficulties with video requiring transitioning to audio format only (telephone).  All issues noted in this document were discussed and addressed.  No physical exam could be performed with this format.  Please refer to the patient's chart for her  consent to telehealth for [facility].    Date:  04/16/2020   ID:  Holly Nunez, DOB January 14, 1925, MRN [id number] The patient was identified using 2 identifiers.  Patient Location: Home Provider Location: Home Office  PCP:  Mosie Lukes, MD  Cardiologist:  Kirk Ruths, MD  Electrophysiologist:  None   Evaluation Performed:  Follow-Up Visit  Chief Complaint:  none  History of Present Illness:    Holly Nunez is a 86 y.o. female with a history of TIA.  She is on Xarelto for this as she had recurrent a TIA on aspirin and Plavix.  Other issues include treated hypertension and dyslipidemia.  She was contacted today for routine follow-up.  I spoke with her son on the phone, the patient was present.  Since she was seen last she is done well from a cardiac standpoint.  She does follow regularly with her primary care provider.  She has had no issues with chest pain or recurrent TIA symptoms.  The patient does not have symptoms concerning for COVID-19 infection (fever, chills, cough, or new shortness of breath).    Past Medical History:  Diagnosis Date  . Arthritis   . Estrogen deficiency 05/27/2016  . Great toe pain, left  12/02/2016  . H/O measles   . H/O mumps   . History of chicken pox   . Hyperglycemia 05/27/2016  . Hyperlipidemia   . Hypertension   . Patent foramen ovale   . Preventative health care 02/26/2015  . Renal insufficiency   . Thyroid nodule    Biopsy-negative  . TIA (transient ischemic attack)   . Urinary incontinence    Past Surgical History:  Procedure Laterality Date  . ABDOMINAL HYSTERECTOMY     fibroid  . APPENDECTOMY    . EYE SURGERY Bilateral    CATARACT REMOVAL  . JOINT REPLACEMENT    . REPLACEMENT TOTAL KNEE Right 2007  . THYROID LOBECTOMY Left 04/25/2015  . THYROID LOBECTOMY Left 04/25/2015   Procedure: LEFT THYROID LOBECTOMY;  Surgeon: Armandina Gemma, MD;  Location: [place];  Service: General;  Laterality: Left;  . TRIGGER FINGER RELEASE  2007     Current Meds  Medication Sig  . acetaminophen (TYLENOL) 650 MG CR tablet Take 1,300 mg by mouth every 8 (eight) hours as needed for pain.  Marland Kitchen atorvastatin (LIPITOR) 40 MG tablet TAKE 1 TABLET(40 MG) BY MOUTH DAILY  . carvedilol (COREG) 3.125 MG tablet TAKE 2 TABLETS(6.25 MG) BY MOUTH TWICE DAILY  . famotidine (PEPCID) 40 MG tablet Take 1 tablet (40 mg total) by mouth at bedtime as needed for heartburn or indigestion.  . nitroGLYCERIN (NITROSTAT) 0.4 MG SL tablet Place 1 tablet (0.4 mg total) under the tongue every 5 (five) minutes as needed for chest  pain.  . oxybutynin (DITROPAN-XL) 10 MG 24 hr tablet TAKE 1 TABLET(10 MG) BY MOUTH AT BEDTIME  . timolol (TIMOPTIC) 0.5 % ophthalmic solution Place 1 drop into both eyes at bedtime.  . triamterene-hydrochlorothiazide (MAXZIDE-25) 37.5-25 MG tablet TAKE 1 TABLET BY MOUTH DAILY  . XARELTO 15 MG TABS tablet TAKE 1 TABLET(15 MG) BY MOUTH DAILY WITH SUPPER     Allergies:   Alphagan [brimonidine]   Social History   Tobacco Use  . Smoking status: Never Smoker  . Smokeless tobacco: Never Used  Substance Use Topics  . Alcohol use: No  . Drug use: No     Family Hx: The patient's  family history includes Bone cancer in her sister; Cancer in her brother, daughter, daughter, maternal grandmother, sister, sister, and sister; Gout in her brother; Heart disease in her mother; Hypertension in her daughter, mother, and son; Irritable bowel syndrome in her daughter; Kidney disease in her sister; Mental illness in her sister; Pancreatic cancer in her daughter; Prostate cancer in her brother; Thyroid disease in her daughter and daughter.  ROS:   Please see the history of present illness.    All other systems reviewed and are negative.   Prior CV studies:   The following studies were reviewed today: Myoview 02/27/2015-  The left ventricular ejection fraction is hyperdynamic (>65%).  Nuclear stress EF: 75%.  There was no ST segment deviation noted during stress.  The study is normal.  This is a low risk study.   Normal lexiscan nuclear stress test demonstrating normal myocardial perfusion and function: EF 75%.     Labs/Other Tests and Data Reviewed:    EKG:  An ECG dated 02/07/2019 was personally reviewed today and demonstrated:  NSR, 74  Recent Labs: 03/04/2020: ALT 19; BUN 22; Creat 1.27; Hemoglobin 11.8; Platelets 178; Potassium 4.0; Sodium 134; TSH 2.41   Recent Lipid Panel Lab Results  Component Value Date/Time   CHOL 142 03/04/2020 11:23 AM   TRIG 65 03/04/2020 11:23 AM   HDL 69 03/04/2020 11:23 AM   CHOLHDL 2.1 03/04/2020 11:23 AM   LDLCALC 59 03/04/2020 11:23 AM    Wt Readings from Last 3 Encounters:  04/16/20 128 lb (58.1 kg)  10/11/19 128 lb (58.1 kg)  08/21/19 133 lb (60.3 kg)     Risk Assessment/Calculations:      Objective:    Vital Signs:  BP 124/68   Ht 5' (1.524 m)   Wt 128 lb (58.1 kg)   BMI 25.00 kg/m    VITAL SIGNS:  reviewed  ASSESSMENT & PLAN:    H/O TIA- The patient is on Xarelto 15 mg daily because of recurrent TIA on aspirin and Plavix.  HTN- Controlled  HLD- Followed by PCP- on statin Rx  Plan- Office visit  in 6 months- they requested [place] office with Dr Stanford Breed.    Shared Decision Making/Informed Consent        COVID-19 Education: The signs and symptoms of COVID-19 were discussed with the patient and how to seek care for testing (follow up with PCP or arrange E-visit).  The importance of social distancing was discussed today.  Time:   Today, I have spent 10 minutes with the patient with telehealth technology discussing the above problems.     Medication Adjustments/Labs and Tests Ordered: Current medicines are reviewed at length with the patient today.  Concerns regarding medicines are outlined above.   Tests Ordered: No orders of the defined types were placed in this encounter.  Medication Changes: No orders of the defined types were placed in this encounter.   Follow Up:  In Person Dr Stanford Breed in 6 months  Signed, Kerin Ransom, Hershal Coria  04/16/2020 11:27 AM    [place]

## 2020-04-16 NOTE — Patient Instructions (Signed)
Medication Instructions:  Continue current medications  *If you need a refill on your cardiac medications before your next appointment, please call your pharmacy*   Lab Work: None Ordered   Testing/Procedures: None Ordered   Follow-Up: At [facility], you and your health needs are our priority.  As part of our continuing mission to provide you with exceptional heart care, we have created designated Provider Care Teams.  These Care Teams include your primary Cardiologist (physician) and Advanced Practice Providers (APPs -  Physician Assistants and Nurse Practitioners) who all work together to provide you with the care you need, when you need it.  We recommend signing up for the patient portal called "MyChart".  Sign up information is provided on this After Visit Summary.  MyChart is used to connect with patients for Virtual Visits (Telemedicine).  Patients are able to view lab/test results, encounter notes, upcoming appointments, etc.  Non-urgent messages can be sent to your provider as well.   To learn more about what you can do with MyChart, go to [email or web address].    Your next appointment:   6 month(s)  The format for your next appointment:   In Person  Provider:   You may see Kirk Ruths, MD or the following Advanced Practice Provider on your designated Care Team:

## 2020-05-13 ENCOUNTER — Ambulatory Visit: Payer: BLUE CROSS/BLUE SHIELD | Admitting: Podiatry

## 2020-06-26 ENCOUNTER — Other Ambulatory Visit: Payer: Self-pay | Admitting: Family Medicine

## 2020-06-26 DIAGNOSIS — N3281 Overactive bladder: Secondary | ICD-10-CM

## 2020-07-26 ENCOUNTER — Other Ambulatory Visit: Payer: Self-pay | Admitting: Family Medicine

## 2020-07-26 ENCOUNTER — Other Ambulatory Visit: Payer: Self-pay | Admitting: Cardiology

## 2020-09-01 ENCOUNTER — Other Ambulatory Visit: Payer: Self-pay

## 2020-09-01 ENCOUNTER — Ambulatory Visit (INDEPENDENT_AMBULATORY_CARE_PROVIDER_SITE_OTHER): Payer: Medicare Other | Admitting: Family Medicine

## 2020-09-01 ENCOUNTER — Ambulatory Visit: Payer: Medicare Other | Attending: Internal Medicine

## 2020-09-01 ENCOUNTER — Encounter: Payer: Self-pay | Admitting: Family Medicine

## 2020-09-01 VITALS — BP 136/58 | HR 70 | Temp 98.2°F | Resp 12 | Ht 60.0 in | Wt 123.4 lb

## 2020-09-01 DIAGNOSIS — N289 Disorder of kidney and ureter, unspecified: Secondary | ICD-10-CM | POA: Diagnosis not present

## 2020-09-01 DIAGNOSIS — E7800 Pure hypercholesterolemia, unspecified: Secondary | ICD-10-CM | POA: Diagnosis not present

## 2020-09-01 DIAGNOSIS — E559 Vitamin D deficiency, unspecified: Secondary | ICD-10-CM | POA: Diagnosis not present

## 2020-09-01 DIAGNOSIS — I10 Essential (primary) hypertension: Secondary | ICD-10-CM

## 2020-09-01 DIAGNOSIS — R739 Hyperglycemia, unspecified: Secondary | ICD-10-CM | POA: Diagnosis not present

## 2020-09-01 DIAGNOSIS — N3281 Overactive bladder: Secondary | ICD-10-CM | POA: Diagnosis not present

## 2020-09-01 DIAGNOSIS — Z23 Encounter for immunization: Secondary | ICD-10-CM

## 2020-09-01 DIAGNOSIS — D538 Other specified nutritional anemias: Secondary | ICD-10-CM

## 2020-09-01 MED ORDER — HYOSCYAMINE SULFATE 0.125 MG PO TBDP: 0.1250 mg | ORAL_TABLET | ORAL | 1 refills | Status: DC | PRN

## 2020-09-01 NOTE — Progress Notes (Signed)
Patient ID: Holly Nunez, female    DOB: 13-May-1925  Age: 87 y.o. MRN: [id number]    Subjective:  Subjective  HPI Holly Nunez presents for office visit today. She states that everything is the same and no updates. She denies any recent issues with urination or bowels. Her son states that since she got in the medication oxybutynin, he has not noticed any improvements in the BMs. She denies any chest pain, SOB, fever, cough, chills, sore throat, dysuria, back pain, HA, or N/VD. She states that she drinks a sufficient amount of water. Her son reports that the patient tends to eat fast when eating meals with her and states her need to visit a dentist. She denies any BMs that are sticky, black, or red in color. Her son reports that she has had some changes in her eating habits in which she has reduced appetite and sometimes instead of eat a meal, she would eat a boost as a meal substitute. She reports feeling abdominal bloating/gas/pain.    Review of Systems  Constitutional: Negative for chills, fatigue and fever.  HENT: Negative for congestion, rhinorrhea, sinus pressure, sinus pain and sore throat.   Eyes: Negative for pain.  Respiratory: Negative for cough and shortness of breath.   Cardiovascular: Negative for chest pain, palpitations and leg swelling.  Gastrointestinal: Positive for abdominal pain. Negative for blood in stool, constipation, diarrhea, nausea and vomiting.  Genitourinary: Negative for decreased urine volume, flank pain, frequency, vaginal bleeding and vaginal discharge.  Musculoskeletal: Negative for back pain.  Neurological: Negative for headaches.    History Past Medical History:  Diagnosis Date  . Arthritis   . Estrogen deficiency 05/27/2016  . Great toe pain, left 12/02/2016  . H/O measles   . H/O mumps   . History of chicken pox   . Hyperglycemia 05/27/2016  . Hyperlipidemia   . Hypertension   . Patent foramen ovale   . Preventative health care 02/26/2015  .  Renal insufficiency   . Thyroid nodule    Biopsy-negative  . TIA (transient ischemic attack)   . Urinary incontinence     She has a past surgical history that includes Joint replacement; Replacement total knee (Right, 2007); Trigger finger release (2007); Appendectomy; Abdominal hysterectomy; Eye surgery (Bilateral); Thyroid lobectomy (Left, 04/25/2015); and Thyroid lobectomy (Left, 04/25/2015).   Her family history includes Bone cancer in her sister; Cancer in her brother, daughter, daughter, maternal grandmother, sister, sister, and sister; Gout in her brother; Heart disease in her mother; Hypertension in her daughter, mother, and son; Irritable bowel syndrome in her daughter; Kidney disease in her sister; Mental illness in her sister; Pancreatic cancer in her daughter; Prostate cancer in her brother; Thyroid disease in her daughter and daughter.She reports that she has never smoked. She has never used smokeless tobacco. She reports that she does not drink alcohol and does not use drugs.  Current Outpatient Medications on File Prior to Visit  Medication Sig Dispense Refill  . acetaminophen (TYLENOL) 650 MG CR tablet Take 1,300 mg by mouth every 8 (eight) hours as needed for pain.    Marland Kitchen atorvastatin (LIPITOR) 40 MG tablet TAKE 1 TABLET(40 MG) BY MOUTH DAILY 90 tablet 1  . carvedilol (COREG) 3.125 MG tablet TAKE 2 TABLETS(6.25 MG) BY MOUTH TWICE DAILY 360 tablet 1  . famotidine (PEPCID) 40 MG tablet Take 1 tablet (40 mg total) by mouth at bedtime as needed for heartburn or indigestion. 30 tablet 5  . oxybutynin (DITROPAN-XL) 10 MG 24 hr  tablet TAKE 1 TABLET(10 MG) BY MOUTH AT BEDTIME 90 tablet 1  . timolol (TIMOPTIC) 0.5 % ophthalmic solution Place 1 drop into both eyes at bedtime.  6  . triamterene-hydrochlorothiazide (MAXZIDE-25) 37.5-25 MG tablet TAKE 1 TABLET BY MOUTH DAILY 90 tablet 1  . XARELTO 15 MG TABS tablet TAKE 1 TABLET(15 MG) BY MOUTH DAILY WITH SUPPER 90 tablet 1  . nitroGLYCERIN  (NITROSTAT) 0.4 MG SL tablet Place 1 tablet (0.4 mg total) under the tongue every 5 (five) minutes as needed for chest pain. 25 tablet 11   No current facility-administered medications on file prior to visit.     Objective:  Objective  Physical Exam Constitutional:      General: She is not in acute distress.    Appearance: Normal appearance. She is not ill-appearing or toxic-appearing.  HENT:     Head: Normocephalic and atraumatic.     Right Ear: Tympanic membrane, ear canal and external ear normal.     Left Ear: Tympanic membrane, ear canal and external ear normal.     Nose: No congestion or rhinorrhea.  Eyes:     Extraocular Movements: Extraocular movements intact.     Pupils: Pupils are equal, round, and reactive to light.  Cardiovascular:     Rate and Rhythm: Normal rate and regular rhythm.     Pulses: Normal pulses.     Heart sounds: Normal heart sounds. No murmur heard.   Pulmonary:     Effort: Pulmonary effort is normal. No respiratory distress.     Breath sounds: Normal breath sounds. No wheezing, rhonchi or rales.  Abdominal:     General: Bowel sounds are normal.     Palpations: Abdomen is soft. There is no mass.     Tenderness: There is no abdominal tenderness. There is no guarding.     Hernia: No hernia is present.  Musculoskeletal:        General: Normal range of motion.     Cervical back: Normal range of motion and neck supple.  Skin:    General: Skin is warm and dry.  Neurological:     Mental Status: She is alert and oriented to person, place, and time.  Psychiatric:        Behavior: Behavior normal.    BP (!) 136/58 (BP Location: Right Arm, Cuff Size: Normal)   Pulse 70   Temp 98.2 F (36.8 C) (Oral)   Resp 12   Ht 5' (1.524 m)   Wt 123 lb 6.4 oz (56 kg)   SpO2 99%   BMI 24.10 kg/m  Wt Readings from Last 3 Encounters:  09/01/20 123 lb 6.4 oz (56 kg)  04/16/20 128 lb (58.1 kg)  10/11/19 128 lb (58.1 kg)     Lab Results  Component Value Date    WBC 4.9 03/04/2020   HGB 11.8 03/04/2020   HCT 36.2 03/04/2020   PLT 178 03/04/2020   GLUCOSE 110 (H) 03/04/2020   CHOL 142 03/04/2020   TRIG 65 03/04/2020   HDL 69 03/04/2020   LDLCALC 59 03/04/2020   ALT 19 03/04/2020   AST 23 03/04/2020   NA 134 (L) 03/04/2020   K 4.0 03/04/2020   CL 95 (L) 03/04/2020   CREATININE 1.27 (H) 03/04/2020   BUN 22 03/04/2020   CO2 26 03/04/2020   TSH 2.41 03/04/2020   INR 1.13 04/25/2015   HGBA1C 5.7 (H) 03/04/2020   MICROALBUR 2.2 (H) 01/23/2018    DG Chest 2 View  Result Date:  02/06/2019 CLINICAL DATA:  left sided weakness, nausea and confusion. She feels completely normal now. She does have a HX of TIAs. This felt like the TIAs she has had in the past. No facial droop. No drift, no slurred speech. Per family pt still seems weaker than normal. Right sided arm weakness. EXAM: CHEST - 2 VIEW COMPARISON:  04/18/2015 FINDINGS: Lungs clear.  Surgical clips at the thoracic inlet on the left. Heart size and mediastinal contours are within normal limits. Tortuous thoracic aorta. Visualized bones unremarkable. IMPRESSION: No acute cardiopulmonary disease. Electronically Signed   By: Lucrezia Europe M.D.   On: 02/06/2019 17:05   CT Head Wo Contrast  Result Date: 02/06/2019 CLINICAL DATA:  Left-sided weakness, TIA EXAM: CT HEAD WITHOUT CONTRAST TECHNIQUE: Contiguous axial images were obtained from the base of the skull through the vertex without intravenous contrast. COMPARISON:  None available FINDINGS: Brain: No evidence of acute infarction, hemorrhage, hydrocephalus, extra-axial collection or mass lesion/mass effect. Extensive low-density changes within the periventricular and subcortical white matter compatible with chronic microvascular ischemic change. Mild diffuse cerebral volume loss. Vascular: Dense atherosclerotic calcifications involving the large vessels of the skull base. No unexpected hyperdense vessel. Skull: Normal. Negative for fracture or focal  lesion. Sinuses/Orbits: No acute finding. Other: None. IMPRESSION: 1. No acute intracranial findings. Follow-up exams with MRI, as clinically indicated. 2. Chronic microvascular ischemic change and cerebral volume loss. Electronically Signed   By: Davina Poke M.D.   On: 02/06/2019 17:02     Assessment & Plan:  Plan    Meds ordered this encounter  Medications  . hyoscyamine (ANASPAZ) 0.125 MG TBDP disintergrating tablet    Sig: Place 1 tablet (0.125 mg total) under the tongue every 4 (four) hours as needed.    Dispense:  30 tablet    Refill:  1    Problem List Items Addressed This Visit    HTN (hypertension) - Primary    Well controlled, no changes to meds. Encouraged heart healthy diet such as the DASH diet and exercise as tolerated.       Relevant Orders   CBC   Comprehensive metabolic panel   TSH   Hyperlipidemia    Encouraged heart healthy diet, increase exercise, avoid trans fats, consider a krill oil cap daily      Relevant Orders   Lipid panel   Anemia   Renal insufficiency    Hydrate and monitor      Overactive bladder    Oxybutynin hs been helpful      Hyperglycemia    hgba1c acceptable, minimize simple carbs. Increase exercise as tolerated.       Relevant Orders   Hemoglobin A1c   Vitamin D deficiency    Supplement and monitor      Relevant Orders   VITAMIN D 25 Hydroxy (Vit-D Deficiency, Fractures)      Follow-up: Return in about 6 months (around 03/03/2021).   I,David Hanna,acting as a scribe for Penni Homans, MD.,have documented all relevant documentation on the behalf of Penni Homans, MD,as directed by  Penni Homans, MD while in the presence of Penni Homans, MD.  I, Mosie Lukes, MD personally performed the services described in this documentation. All medical record entries made by the scribe were at my direction and in my presence. I have reviewed the chart and agree that the record reflects my personal performance and is accurate and  complete

## 2020-09-01 NOTE — Assessment & Plan Note (Signed)
Oxybutynin hs been helpful

## 2020-09-01 NOTE — Assessment & Plan Note (Signed)
Supplement and monitor 

## 2020-09-01 NOTE — Assessment & Plan Note (Signed)
Hydrate and monitor 

## 2020-09-01 NOTE — Assessment & Plan Note (Signed)
Encouraged heart healthy diet, increase exercise, avoid trans fats, consider a krill oil cap daily 

## 2020-09-01 NOTE — Assessment & Plan Note (Signed)
Well controlled, no changes to meds. Encouraged heart healthy diet such as the DASH diet and exercise as tolerated.  °

## 2020-09-01 NOTE — Patient Instructions (Addendum)
NOW company 10 strain probiotic at [email or web address] or [employer] or [employer] fiber once a day.  Hypertension, Adult High blood pressure (hypertension) is when the force of blood pumping through the arteries is too strong. The arteries are the blood vessels that carry blood from the heart throughout the body. Hypertension forces the heart to work harder to pump blood and may cause arteries to become narrow or stiff. Untreated or uncontrolled hypertension can cause a heart attack, heart failure, a stroke, kidney disease, and other problems. A blood pressure reading consists of a higher number over a lower number. Ideally, your blood pressure should be below 120/80. The first ("top") number is called the systolic pressure. It is a measure of the pressure in your arteries as your heart beats. The second ("bottom") number is called the diastolic pressure. It is a measure of the pressure in your arteries as the heart relaxes. What are the causes? The exact cause of this condition is not known. There are some conditions that result in or are related to high blood pressure. What increases the risk? Some risk factors for high blood pressure are under your control. The following factors may make you more likely to develop this condition:  Smoking.  Having type 2 diabetes mellitus, high cholesterol, or both.  Not getting enough exercise or physical activity.  Being overweight.  Having too much fat, sugar, calories, or salt (sodium) in your diet.  Drinking too much alcohol. Some risk factors for high blood pressure may be difficult or impossible to change. Some of these factors include:  Having chronic kidney disease.  Having a family history of high blood pressure.  Age. Risk increases with age.  Race. You may be at higher risk if you are African American.  Gender. Men are at higher risk than women before age 58. After age 61, women are at higher risk than men.  Having obstructive sleep  apnea.  Stress. What are the signs or symptoms? High blood pressure may not cause symptoms. Very high blood pressure (hypertensive crisis) may cause:  Headache.  Anxiety.  Shortness of breath.  Nosebleed.  Nausea and vomiting.  Vision changes.  Severe chest pain.  Seizures. How is this diagnosed? This condition is diagnosed by measuring your blood pressure while you are seated, with your arm resting on a flat surface, your legs uncrossed, and your feet flat on the floor. The cuff of the blood pressure monitor will be placed directly against the skin of your upper arm at the level of your heart. It should be measured at least twice using the same arm. Certain conditions can cause a difference in blood pressure between your right and left arms. Certain factors can cause blood pressure readings to be lower or higher than normal for a short period of time:  When your blood pressure is higher when you are in a health care provider's office than when you are at home, this is called white coat hypertension. Most people with this condition do not need medicines.  When your blood pressure is higher at home than when you are in a health care provider's office, this is called masked hypertension. Most people with this condition may need medicines to control blood pressure. If you have a high blood pressure reading during one visit or you have normal blood pressure with other risk factors, you may be asked to:  Return on a different day to have your blood pressure checked again.  Monitor your blood pressure at home  for 1 week or longer. If you are diagnosed with hypertension, you may have other blood or imaging tests to help your health care provider understand your overall risk for other conditions. How is this treated? This condition is treated by making healthy lifestyle changes, such as eating healthy foods, exercising more, and reducing your alcohol intake. Your health care provider may  prescribe medicine if lifestyle changes are not enough to get your blood pressure under control, and if:  Your systolic blood pressure is above 130.  Your diastolic blood pressure is above 80. Your personal target blood pressure may vary depending on your medical conditions, your age, and other factors. Follow these instructions at home: Eating and drinking  Eat a diet that is high in fiber and potassium, and low in sodium, added sugar, and fat. An example eating plan is called the DASH (Dietary Approaches to Stop Hypertension) diet. To eat this way: ? Eat plenty of fresh fruits and vegetables. Try to fill one half of your plate at each meal with fruits and vegetables. ? Eat whole grains, such as whole-wheat pasta, brown rice, or whole-grain bread. Fill about one fourth of your plate with whole grains. ? Eat or drink low-fat dairy products, such as skim milk or low-fat yogurt. ? Avoid fatty cuts of meat, processed or cured meats, and poultry with skin. Fill about one fourth of your plate with lean proteins, such as fish, chicken without skin, beans, eggs, or tofu. ? Avoid pre-made and processed foods. These tend to be higher in sodium, added sugar, and fat.  Reduce your daily sodium intake. Most people with hypertension should eat less than 1,500 mg of sodium a day.  Do not drink alcohol if: ? Your health care provider tells you not to drink. ? You are pregnant, may be pregnant, or are planning to become pregnant.  If you drink alcohol: ? Limit how much you use to:  0-1 drink a day for women.  0-2 drinks a day for men. ? Be aware of how much alcohol is in your drink. In the [place] one drink equals one 12 oz bottle of beer (355 mL), one 5 oz glass of wine (148 mL), or one 1 oz glass of hard liquor (44 mL).   Lifestyle  Work with your health care provider to maintain a healthy body weight or to lose weight. Ask what an ideal weight is for you.  Get at least 30 minutes of exercise most  days of the week. Activities may include walking, swimming, or biking.  Include exercise to strengthen your muscles (resistance exercise), such as Pilates or lifting weights, as part of your weekly exercise routine. Try to do these types of exercises for 30 minutes at least 3 days a week.  Do not use any products that contain nicotine or tobacco, such as cigarettes, e-cigarettes, and chewing tobacco. If you need help quitting, ask your health care provider.  Monitor your blood pressure at home as told by your health care provider.  Keep all follow-up visits as told by your health care provider. This is important.   Medicines  Take over-the-counter and prescription medicines only as told by your health care provider. Follow directions carefully. Blood pressure medicines must be taken as prescribed.  Do not skip doses of blood pressure medicine. Doing this puts you at risk for problems and can make the medicine less effective.  Ask your health care provider about side effects or reactions to medicines that you should watch  for. Contact a health care provider if you:  Think you are having a reaction to a medicine you are taking.  Have headaches that keep coming back (recurring).  Feel dizzy.  Have swelling in your ankles.  Have trouble with your vision. Get help right away if you:  Develop a severe headache or confusion.  Have unusual weakness or numbness.  Feel faint.  Have severe pain in your chest or abdomen.  Vomit repeatedly.  Have trouble breathing. Summary  Hypertension is when the force of blood pumping through your arteries is too strong. If this condition is not controlled, it may put you at risk for serious complications.  Your personal target blood pressure may vary depending on your medical conditions, your age, and other factors. For most people, a normal blood pressure is less than 120/80.  Hypertension is treated with lifestyle changes, medicines, or a  combination of both. Lifestyle changes include losing weight, eating a healthy, low-sodium diet, exercising more, and limiting alcohol. This information is not intended to replace advice given to you by your health care provider. Make sure you discuss any questions you have with your health care provider. Document Revised: 01/11/2018 Document Reviewed: 01/11/2018 Elsevier Patient Education  2021 [employer].

## 2020-09-01 NOTE — Progress Notes (Signed)
   Covid-19 Vaccination Clinic  Name:  Shanley Furlough    MRN: [id number] DOB: 11-06-1924  09/01/2020  Ms. Brightwell was observed post Covid-19 immunization for 15 minutes without incident. She was provided with Vaccine Information Sheet and instruction to access the V-Safe system.   Ms. Sickman was instructed to call 911 with any severe reactions post vaccine: Marland Kitchen Difficulty breathing  . Swelling of face and throat  . A fast heartbeat  . A bad rash all over body  . Dizziness and weakness   Immunizations Administered    Name Date Dose VIS Date Route   Moderna Covid-19 Booster Vaccine 09/01/2020  3:09 PM 0.25 mL 03/05/2020 Intramuscular   Manufacturer: Moderna   Lot: [id number]   [place]: 53912-258-34

## 2020-09-01 NOTE — Assessment & Plan Note (Signed)
hgba1c acceptable, minimize simple carbs. Increase exercise as tolerated.  

## 2020-09-02 LAB — CBC
HCT: 33.5 % — ABNORMAL LOW (ref 36.0–46.0)
Hemoglobin: 11.4 g/dL — ABNORMAL LOW (ref 12.0–15.0)
MCHC: 33.9 g/dL (ref 30.0–36.0)
MCV: 96.5 fl (ref 78.0–100.0)
Platelets: 180 10*3/uL (ref 150.0–400.0)
RBC: 3.48 Mil/uL — ABNORMAL LOW (ref 3.87–5.11)
RDW: 14.4 % (ref 11.5–15.5)
WBC: 4.9 10*3/uL (ref 4.0–10.5)

## 2020-09-02 LAB — TSH: TSH: 1.72 u[IU]/mL (ref 0.35–4.50)

## 2020-09-02 LAB — COMPREHENSIVE METABOLIC PANEL
ALT: 19 U/L (ref 0–35)
AST: 20 U/L (ref 0–37)
Albumin: 3.9 g/dL (ref 3.5–5.2)
Alkaline Phosphatase: 52 U/L (ref 39–117)
BUN: 27 mg/dL — ABNORMAL HIGH (ref 6–23)
CO2: 30 mEq/L (ref 19–32)
Calcium: 10 mg/dL (ref 8.4–10.5)
Chloride: 103 mEq/L (ref 96–112)
Creatinine, Ser: 1.22 mg/dL — ABNORMAL HIGH (ref 0.40–1.20)
GFR: 38.25 mL/min — ABNORMAL LOW (ref >60.00)
Glucose, Bld: 108 mg/dL — ABNORMAL HIGH (ref 70–99)
Potassium: 4.4 mEq/L (ref 3.5–5.1)
Sodium: 140 mEq/L (ref 135–145)
Total Bilirubin: 0.7 mg/dL (ref 0.2–1.2)
Total Protein: 6.8 g/dL (ref 6.0–8.3)

## 2020-09-02 LAB — LIPID PANEL
Cholesterol: 137 mg/dL (ref 0–200)
HDL: 75.2 mg/dL (ref >39.00)
LDL Cholesterol: 44 mg/dL (ref 0–99)
NonHDL: 61.79
Total CHOL/HDL Ratio: 2
Triglycerides: 88 mg/dL (ref 0.0–149.0)
VLDL: 17.6 mg/dL (ref 0.0–40.0)

## 2020-09-02 LAB — HEMOGLOBIN A1C: Hgb A1c MFr Bld: 5.6 % (ref 4.6–6.5)

## 2020-09-02 LAB — VITAMIN D 25 HYDROXY (VIT D DEFICIENCY, FRACTURES): VITD: 37.76 ng/mL (ref 30.00–100.00)

## 2020-09-03 ENCOUNTER — Other Ambulatory Visit (INDEPENDENT_AMBULATORY_CARE_PROVIDER_SITE_OTHER): Payer: Medicare Other

## 2020-09-03 DIAGNOSIS — D539 Nutritional anemia, unspecified: Secondary | ICD-10-CM | POA: Diagnosis not present

## 2020-09-03 LAB — VITAMIN B12: Vitamin B-12: 439 pg/mL (ref 211–911)

## 2020-09-03 NOTE — Addendum Note (Signed)
Addended by: Octavio Manns E on: 09/03/2020 03:23 PM   Modules accepted: Orders

## 2020-09-04 ENCOUNTER — Other Ambulatory Visit (HOSPITAL_BASED_OUTPATIENT_CLINIC_OR_DEPARTMENT_OTHER): Payer: Self-pay

## 2020-09-04 MED ORDER — MODERNA COVID-19 VACCINE 100 MCG/0.5ML IM SUSP
INTRAMUSCULAR | 0 refills | Status: DC
  Filled 2020-09-04: qty 0.25, 1d supply, fill #0

## 2020-09-05 ENCOUNTER — Other Ambulatory Visit (HOSPITAL_BASED_OUTPATIENT_CLINIC_OR_DEPARTMENT_OTHER): Payer: Self-pay

## 2020-09-12 ENCOUNTER — Encounter: Payer: Self-pay | Admitting: *Deleted

## 2020-09-16 ENCOUNTER — Ambulatory Visit (INDEPENDENT_AMBULATORY_CARE_PROVIDER_SITE_OTHER): Payer: Medicare Other | Admitting: Podiatry

## 2020-09-16 ENCOUNTER — Other Ambulatory Visit: Payer: Self-pay

## 2020-09-16 DIAGNOSIS — M79675 Pain in left toe(s): Secondary | ICD-10-CM | POA: Diagnosis not present

## 2020-09-16 DIAGNOSIS — B351 Tinea unguium: Secondary | ICD-10-CM | POA: Diagnosis not present

## 2020-09-16 DIAGNOSIS — Z7901 Long term (current) use of anticoagulants: Secondary | ICD-10-CM | POA: Diagnosis not present

## 2020-09-16 DIAGNOSIS — M79674 Pain in right toe(s): Secondary | ICD-10-CM | POA: Diagnosis not present

## 2020-09-16 NOTE — Progress Notes (Signed)
Subjective: 87 y.o. returns the office today for painful, elongated, thickened toenails which she cannot trim herself. Denies any redness or drainage around the nails/calluses. No new concerns. Denies any systemic complaints such as fevers, chills, nausea, vomiting.   PCP: Mosie Lukes, MD Last seen September 01, 2020.  Objective: NAD-presents with her son DP/PT pulses palpable, CRT less than 3 seconds Sensation appears to be intact with Thornell Mule monofilament Nails hypertrophic, dystrophic, elongated, brittle, discolored 10. There is tenderness overlying the nails 1-5 bilaterally. There is no surrounding erythema or drainage along the nail sites. No significant hyperkeratotic tissue today. No open lesions or pre-ulcerative lesions are identified. No pain with calf compression, swelling, warmth, erythema.  Assessment: Symptomatic onychomycosis  Plan: -Treatment options including alternatives, risks, complications were discussed -Nails sharply debrided 10 without complication/bleeding. -Discussed daily foot inspection. If there are any changes, to call the office immediately.  -Follow-up in 3 months or sooner if any problems are to arise. In the meantime, encouraged to call the office with any questions, concerns, changes symptoms.  Celesta Gentile, DPM

## 2020-09-22 NOTE — Progress Notes (Signed)
HPI: FU hypertension, patent foramen ovale and prior TIA. Patient previously lived in [place]. Echocardiogram in November 2014 showed normal LV function, mild to moderate left ventricular hypertrophy, grade 2 diastolic dysfunction, aneurysmal atrial septum with PFO. There was moderate atherosclerosis in the aortic arch. She has a history of TIAs; Initially treated with Plavix and aspirin but recurrence and now on xarelto. CT 9/16 showed thyromegaly and coronary calcification. Nuclear study 10/16 showed EF 75 with normal perfusion. Since last seen,she denies dyspnea, chest pain, palpitations or syncope.  No bleeding.  Current Outpatient Medications  Medication Sig Dispense Refill  . acetaminophen (TYLENOL) 650 MG CR tablet Take 1,300 mg by mouth every 8 (eight) hours as needed for pain.    Marland Kitchen atorvastatin (LIPITOR) 40 MG tablet TAKE 1 TABLET(40 MG) BY MOUTH DAILY 90 tablet 1  . carvedilol (COREG) 3.125 MG tablet TAKE 2 TABLETS(6.25 MG) BY MOUTH TWICE DAILY 360 tablet 1  . COVID-19 mRNA vaccine, Moderna, (MODERNA COVID-19 VACCINE) 100 MCG/0.5ML injection Inject into the muscle. 0.25 mL 0  . famotidine (PEPCID) 40 MG tablet Take 1 tablet (40 mg total) by mouth at bedtime as needed for heartburn or indigestion. 30 tablet 5  . hyoscyamine (ANASPAZ) 0.125 MG TBDP disintergrating tablet Place 1 tablet (0.125 mg total) under the tongue every 4 (four) hours as needed. 30 tablet 1  . nitroGLYCERIN (NITROSTAT) 0.4 MG SL tablet Place 1 tablet (0.4 mg total) under the tongue every 5 (five) minutes as needed for chest pain. 25 tablet 11  . oxybutynin (DITROPAN-XL) 10 MG 24 hr tablet TAKE 1 TABLET(10 MG) BY MOUTH AT BEDTIME 90 tablet 1  . timolol (TIMOPTIC) 0.5 % ophthalmic solution Place 1 drop into both eyes at bedtime.  6  . triamterene-hydrochlorothiazide (MAXZIDE-25) 37.5-25 MG tablet TAKE 1 TABLET BY MOUTH DAILY 90 tablet 1  . XARELTO 15 MG TABS tablet TAKE 1 TABLET(15 MG) BY MOUTH DAILY WITH  SUPPER 90 tablet 1   No current facility-administered medications for this visit.     Past Medical History:  Diagnosis Date  . Arthritis   . Estrogen deficiency 05/27/2016  . Great toe pain, left 12/02/2016  . H/O measles   . H/O mumps   . History of chicken pox   . Hyperglycemia 05/27/2016  . Hyperlipidemia   . Hypertension   . Patent foramen ovale   . Preventative health care 02/26/2015  . Renal insufficiency   . Thyroid nodule    Biopsy-negative  . TIA (transient ischemic attack)   . Urinary incontinence     Past Surgical History:  Procedure Laterality Date  . ABDOMINAL HYSTERECTOMY     fibroid  . APPENDECTOMY    . EYE SURGERY Bilateral    CATARACT REMOVAL  . JOINT REPLACEMENT    . REPLACEMENT TOTAL KNEE Right 2007  . THYROID LOBECTOMY Left 04/25/2015  . THYROID LOBECTOMY Left 04/25/2015   Procedure: LEFT THYROID LOBECTOMY;  Surgeon: Armandina Gemma, MD;  Location: [facility];  Service: General;  Laterality: Left;  . TRIGGER FINGER RELEASE  2007    Social History   Socioeconomic History  . Marital status: Widowed    Spouse name: Not on file  . Number of children: 4  . Years of education: Not on file  . Highest education level: Not on file  Occupational History  . Not on file  Tobacco Use  . Smoking status: Never Smoker  . Smokeless tobacco: Never Used  Substance and Sexual Activity  .  Alcohol use: No  . Drug use: No  . Sexual activity: Not Currently    Partners: Male    Comment: no dietary restrictions, lives with 2 daughters  Other Topics Concern  . Not on file  Social History Narrative   No dietary restrictions and lives with daughters.   Social Determinants of Health   Financial Resource Strain: Not on file  Food Insecurity: Not on file  Transportation Needs: Not on file  Physical Activity: Not on file  Stress: Not on file  Social Connections: Not on file  Intimate Partner Violence: Not on file    Family History  Problem Relation Age of Onset  .  Heart disease Mother   . Hypertension Mother   . Kidney disease Sister   . Mental illness Sister        suicide, depresion  . Gout Brother   . Hypertension Son   . Cancer Maternal Grandmother   . Cancer Sister   . Bone cancer Sister   . Cancer Sister   . Cancer Sister        kidney  . Cancer Daughter   . Pancreatic cancer Daughter   . Thyroid disease Daughter   . Thyroid disease Daughter        thyroid nodules  . Hypertension Daughter   . Irritable bowel syndrome Daughter   . Cancer Daughter        thyroid  . Cancer Brother   . Prostate cancer Brother     ROS: no fevers or chills, productive cough, hemoptysis, dysphasia, odynophagia, melena, hematochezia, dysuria, hematuria, rash, seizure activity, orthopnea, PND, pedal edema, claudication. Remaining systems are negative.  Physical Exam: Well-developed well-nourished in no acute distress.  Skin is warm and dry.  HEENT is normal.  Neck is supple.  Chest is clear to auscultation with normal expansion.  Cardiovascular exam is regular rate and rhythm.  Abdominal exam nontender or distended. No masses palpated. Extremities show no edema. neuro grossly intact  ECG-sinus bradycardia, normal axis.  Personally reviewed  A/P  1 hypertension-patient's blood pressure is controlled.  Continue present medications and follow.  2 hyperlipidemia-continue statin.  3 history of TIAs-patient has been documented to have patent foramen ovale and atrial septal aneurysm at an outside hospital.  She had recurrent episodes despite aspirin and Plavix and is therefore been treated with Xarelto.  We will continue.  Kirk Ruths, MD

## 2020-09-30 ENCOUNTER — Other Ambulatory Visit: Payer: Self-pay | Admitting: Family Medicine

## 2020-10-01 ENCOUNTER — Other Ambulatory Visit: Payer: Self-pay

## 2020-10-01 ENCOUNTER — Ambulatory Visit (INDEPENDENT_AMBULATORY_CARE_PROVIDER_SITE_OTHER): Payer: Medicare Other | Admitting: Cardiology

## 2020-10-01 ENCOUNTER — Encounter: Payer: Self-pay | Admitting: Cardiology

## 2020-10-01 VITALS — BP 124/62 | HR 57 | Ht <= 58 in | Wt 148.0 lb

## 2020-10-01 DIAGNOSIS — I10 Essential (primary) hypertension: Secondary | ICD-10-CM | POA: Diagnosis not present

## 2020-10-01 DIAGNOSIS — Z8673 Personal history of transient ischemic attack (TIA), and cerebral infarction without residual deficits: Secondary | ICD-10-CM | POA: Diagnosis not present

## 2020-10-01 DIAGNOSIS — E785 Hyperlipidemia, unspecified: Secondary | ICD-10-CM

## 2020-10-01 MED ORDER — NITROGLYCERIN 0.4 MG SL SUBL: 0.4000 mg | SUBLINGUAL_TABLET | SUBLINGUAL | 11 refills | Status: DC | PRN

## 2020-10-01 NOTE — Patient Instructions (Signed)

## 2020-10-24 ENCOUNTER — Other Ambulatory Visit: Payer: Self-pay | Admitting: Family Medicine

## 2020-10-24 DIAGNOSIS — N3281 Overactive bladder: Secondary | ICD-10-CM

## 2020-10-31 IMAGING — DX DG CHEST 2V
2 series · 2 of 2 positions shown · non-contrast
Comparison: 04/18/2015

CLINICAL DATA: left sided weakness, nausea and confusion. She feels
completely normal now. She does have a HX of TIAs. This felt like
the TIAs she has had in the past. No facial droop. No drift, no
slurred speech. Per family pt still seems weaker than normal. Right
sided arm weakness.

EXAM:
CHEST - 2 VIEW

[chest lat]
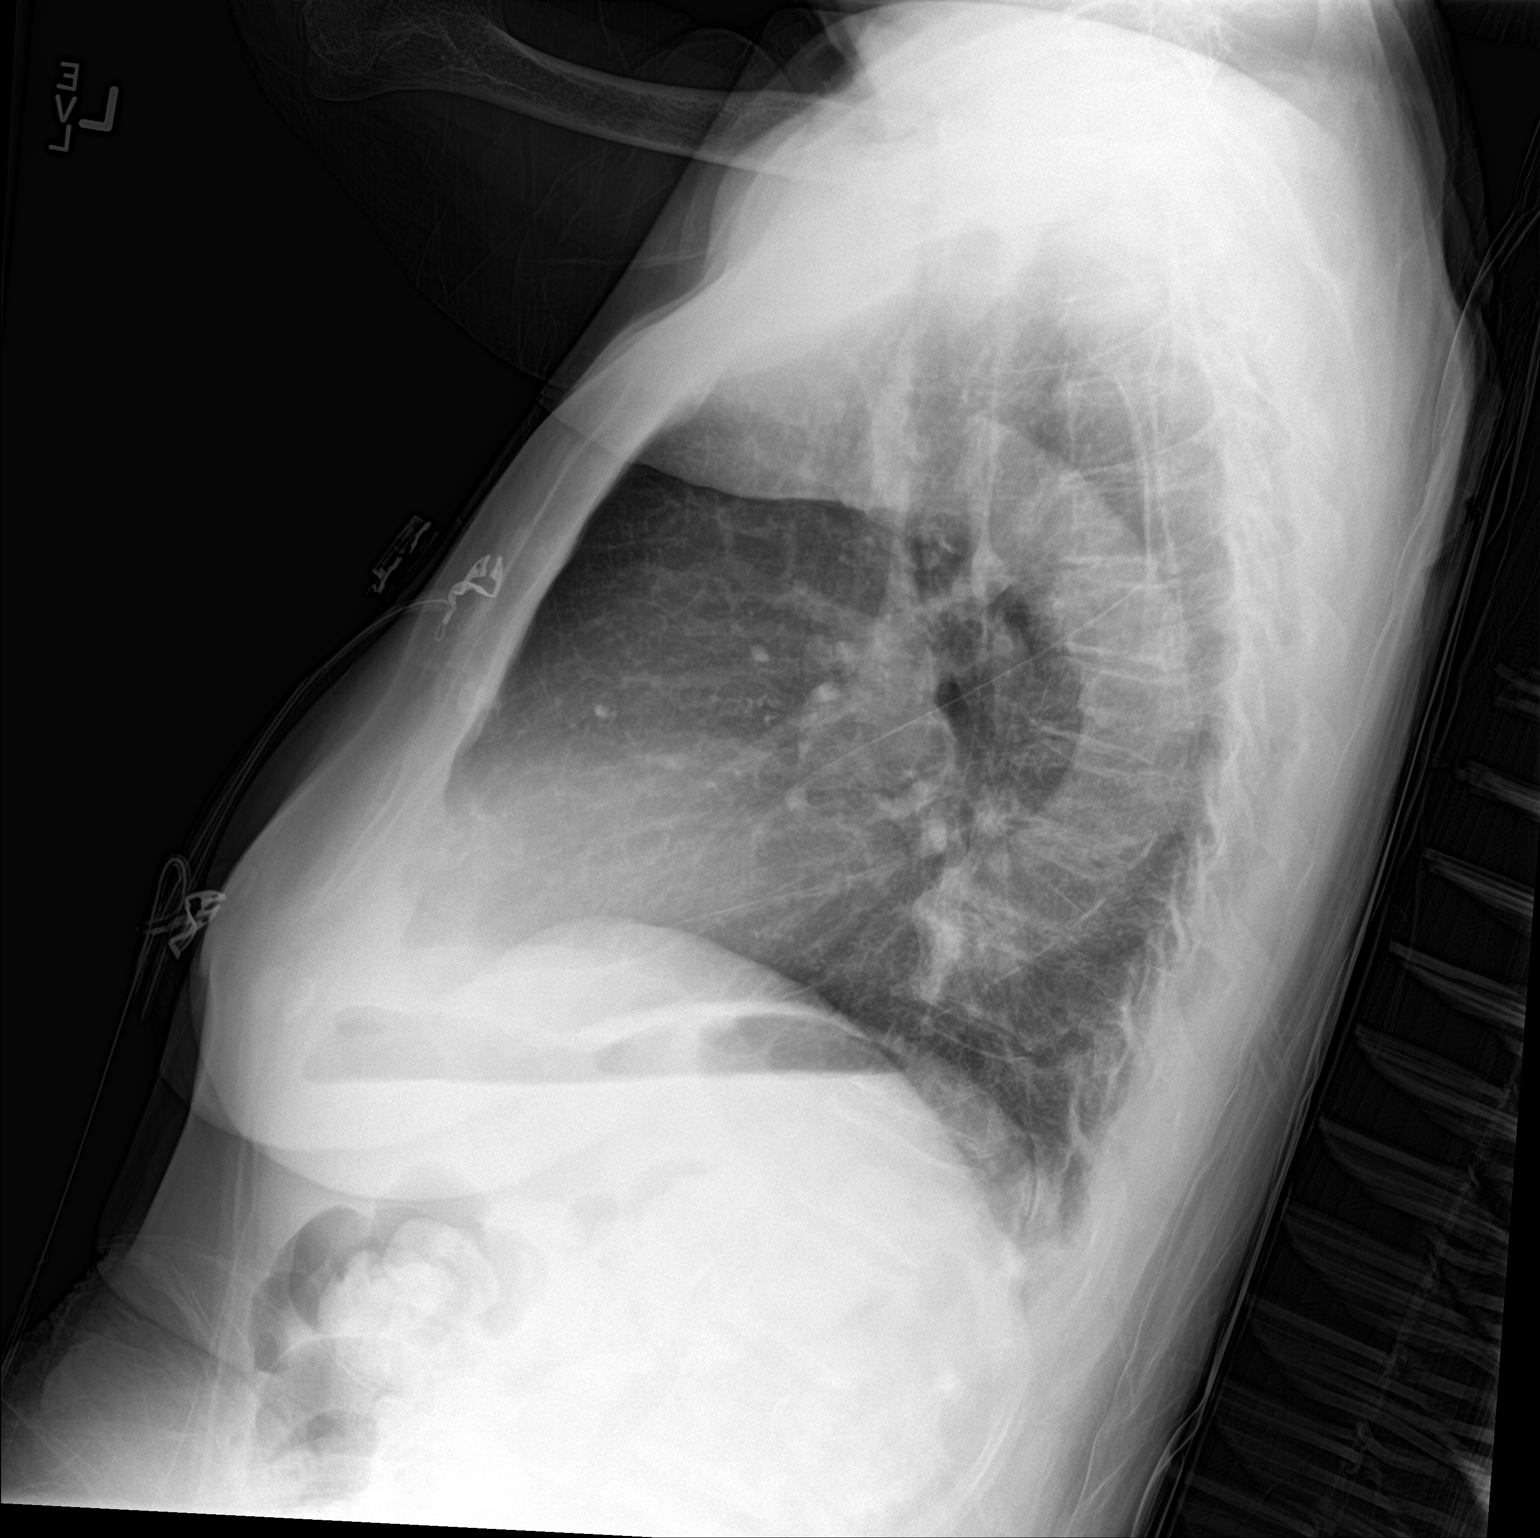

[chest ap]
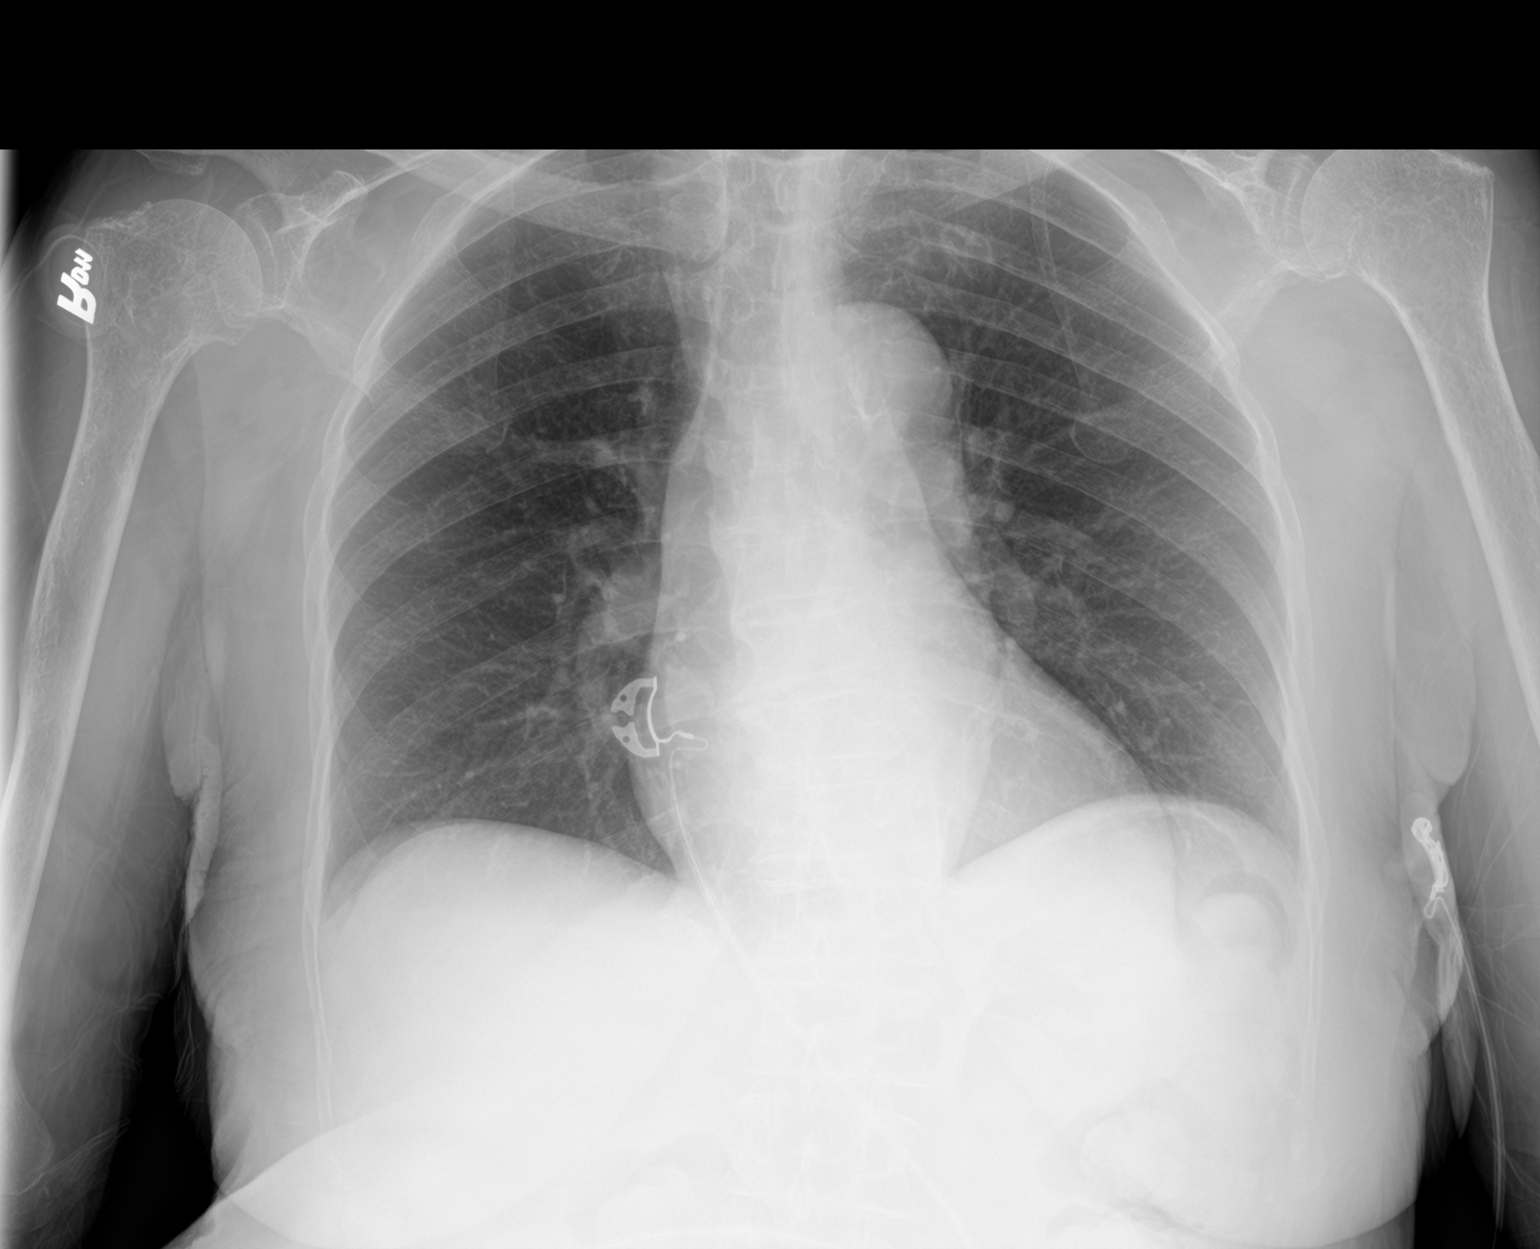

[2 of 2 positions shown; findings below may reference images not displayed]

FINDINGS: Lungs clear.  Surgical clips at the thoracic inlet on the left.

Heart size and mediastinal contours are within normal limits.

Tortuous thoracic aorta.

Visualized bones unremarkable.
IMPRESSION: No acute cardiopulmonary disease.

## 2021-01-20 ENCOUNTER — Other Ambulatory Visit (HOSPITAL_BASED_OUTPATIENT_CLINIC_OR_DEPARTMENT_OTHER): Payer: Self-pay

## 2021-01-22 ENCOUNTER — Other Ambulatory Visit: Payer: Self-pay | Admitting: Family Medicine

## 2021-01-22 ENCOUNTER — Other Ambulatory Visit: Payer: Self-pay | Admitting: Cardiology

## 2021-01-22 NOTE — Telephone Encounter (Signed)
Prescription refill request for Xarelto received.  Last office visit:[place]shaw 10/01/20 Weight:67.1kg Age:44fScr:1.22 09/01/20 CrCl:30.5

## 2021-01-29 ENCOUNTER — Ambulatory Visit (INDEPENDENT_AMBULATORY_CARE_PROVIDER_SITE_OTHER): Payer: Medicare Other

## 2021-01-29 VITALS — Ht 60.0 in | Wt 148.0 lb

## 2021-01-29 DIAGNOSIS — Z0000 Encounter for general adult medical examination without abnormal findings: Secondary | ICD-10-CM | POA: Diagnosis not present

## 2021-01-29 NOTE — Patient Instructions (Signed)
[name] , Thank you for taking time to complete your Medicare Wellness Visit. I appreciate your ongoing commitment to your health goals. Please review the following plan we discussed and let me know if I can assist you in the future.   Screening recommendations/referrals: Colonoscopy: No longer required Mammogram: Declined Bone Density: Declined Recommended yearly ophthalmology/optometry visit for glaucoma screening and checkup Recommended yearly dental visit for hygiene and checkup  Vaccinations: Influenza vaccine: Due-May obtain vaccine at our office or your local pharmacy. Pneumococcal vaccine: Up to date Tdap vaccine: Discuss with pharmacy Shingles vaccine: Discuss with pharmacy   Covid-19:Newest booster available at the pharmacy.  Advanced directives: Information mailed today  Conditions/risks identified: See problem list  Next appointment: Follow up in one year for your annual wellness visit    Preventive Care 65 Years and Older, Female Preventive care refers to lifestyle choices and visits with your health care provider that can promote health and wellness. What does preventive care include? A yearly physical exam. This is also called an annual well check. Dental exams once or twice a year. Routine eye exams. Ask your health care provider how often you should have your eyes checked. Personal lifestyle choices, including: Daily care of your teeth and gums. Regular physical activity. Eating a healthy diet. Avoiding tobacco and drug use. Limiting alcohol use. Practicing safe sex. Taking low-dose aspirin every day. Taking vitamin and mineral supplements as recommended by your health care provider. What happens during an annual well check? The services and screenings done by your health care provider during your annual well check will depend on your age, overall health, lifestyle risk factors, and family history of disease. Counseling  Your health care provider may ask  you questions about your: Alcohol use. Tobacco use. Drug use. Emotional well-being. Home and relationship well-being. Sexual activity. Eating habits. History of falls. Memory and ability to understand (cognition). Work and work Statistician. Reproductive health. Screening  You may have the following tests or measurements: Height, weight, and BMI. Blood pressure. Lipid and cholesterol levels. These may be checked every 5 years, or more frequently if you are over 74 years old. Skin check. Lung cancer screening. You may have this screening every year starting at age 85 if you have a 30-pack-year history of smoking and currently smoke or have quit within the past 15 years. Fecal occult blood test (FOBT) of the stool. You may have this test every year starting at age 855. Flexible sigmoidoscopy or colonoscopy. You may have a sigmoidoscopy every 5 years or a colonoscopy every 10 years starting at age 40. Hepatitis C blood test. Hepatitis B blood test. Sexually transmitted disease (STD) testing. Diabetes screening. This is done by checking your blood sugar (glucose) after you have not eaten for a while (fasting). You may have this done every 1-3 years. Bone density scan. This is done to screen for osteoporosis. You may have this done starting at age 85. Mammogram. This may be done every 1-2 years. Talk to your health care provider about how often you should have regular mammograms. Talk with your health care provider about your test results, treatment options, and if necessary, the need for more tests. Vaccines  Your health care provider may recommend certain vaccines, such as: Influenza vaccine. This is recommended every year. Tetanus, diphtheria, and acellular pertussis (Tdap, Td) vaccine. You may need a Td booster every 10 years. Zoster vaccine. You may need this after age 85. Pneumococcal 13-valent conjugate (PCV13) vaccine. One dose is recommended after  age 85. Pneumococcal  polysaccharide (PPSV23) vaccine. One dose is recommended after age 85. Talk to your health care provider about which screenings and vaccines you need and how often you need them. This information is not intended to replace advice given to you by your health care provider. Make sure you discuss any questions you have with your health care provider. Document Released: 05/30/2015 Document Revised: 01/21/2016 Document Reviewed: 03/04/2015 Elsevier Interactive Patient Education  2017 [employer] Prevention in the Home Falls can cause injuries. They can happen to people of all ages. There are many things you can do to make your home safe and to help prevent falls. What can I do on the outside of my home? Regularly fix the edges of walkways and driveways and fix any cracks. Remove anything that might make you trip as you walk through a door, such as a raised step or threshold. Trim any bushes or trees on the path to your home. Use bright outdoor lighting. Clear any walking paths of anything that might make someone trip, such as rocks or tools. Regularly check to see if handrails are loose or broken. Make sure that both sides of any steps have handrails. Any raised decks and porches should have guardrails on the edges. Have any leaves, snow, or ice cleared regularly. Use sand or salt on walking paths during winter. Clean up any spills in your garage right away. This includes oil or grease spills. What can I do in the bathroom? Use night lights. Install grab bars by the toilet and in the tub and shower. Do not use towel bars as grab bars. Use non-skid mats or decals in the tub or shower. If you need to sit down in the shower, use a plastic, non-slip stool. Keep the floor dry. Clean up any water that spills on the floor as soon as it happens. Remove soap buildup in the tub or shower regularly. Attach bath mats securely with double-sided non-slip rug tape. Do not have throw rugs and other  things on the floor that can make you trip. What can I do in the bedroom? Use night lights. Make sure that you have a light by your bed that is easy to reach. Do not use any sheets or blankets that are too big for your bed. They should not hang down onto the floor. Have a firm chair that has side arms. You can use this for support while you get dressed. Do not have throw rugs and other things on the floor that can make you trip. What can I do in the kitchen? Clean up any spills right away. Avoid walking on wet floors. Keep items that you use a lot in easy-to-reach places. If you need to reach something above you, use a strong step stool that has a grab bar. Keep electrical cords out of the way. Do not use floor polish or wax that makes floors slippery. If you must use wax, use non-skid floor wax. Do not have throw rugs and other things on the floor that can make you trip. What can I do with my stairs? Do not leave any items on the stairs. Make sure that there are handrails on both sides of the stairs and use them. Fix handrails that are broken or loose. Make sure that handrails are as long as the stairways. Check any carpeting to make sure that it is firmly attached to the stairs. Fix any carpet that is loose or worn. Avoid having throw rugs  at the top or bottom of the stairs. If you do have throw rugs, attach them to the floor with carpet tape. Make sure that you have a light switch at the top of the stairs and the bottom of the stairs. If you do not have them, ask someone to add them for you. What else can I do to help prevent falls? Wear shoes that: Do not have high heels. Have rubber bottoms. Are comfortable and fit you well. Are closed at the toe. Do not wear sandals. If you use a stepladder: Make sure that it is fully opened. Do not climb a closed stepladder. Make sure that both sides of the stepladder are locked into place. Ask someone to hold it for you, if possible. Clearly  mark and make sure that you can see: Any grab bars or handrails. First and last steps. Where the edge of each step is. Use tools that help you move around (mobility aids) if they are needed. These include: Canes. Walkers. Scooters. Crutches. Turn on the lights when you go into a dark area. Replace any light bulbs as soon as they burn out. Set up your furniture so you have a clear path. Avoid moving your furniture around. If any of your floors are uneven, fix them. If there are any pets around you, be aware of where they are. Review your medicines with your doctor. Some medicines can make you feel dizzy. This can increase your chance of falling. Ask your doctor what other things that you can do to help prevent falls. This information is not intended to replace advice given to you by your health care provider. Make sure you discuss any questions you have with your health care provider. Document Released: 02/27/2009 Document Revised: 10/09/2015 Document Reviewed: 06/07/2014 Elsevier Interactive Patient Education  2017 [employer].

## 2021-01-29 NOTE — Progress Notes (Signed)
Subjective:   Holly Nunez is a 87 y.o. female who presents for Medicare Annual (Subsequent) preventive examination.  I connected with Holly Nunez today by telephone and verified that I am speaking with the correct person using two identifiers. Location patient: home Location provider: work Persons participating in the virtual visit: patient, son, Marine scientist.    I discussed the limitations, risks, security and privacy concerns of performing an evaluation and management service by telephone and the availability of in person appointments. I also discussed with the patient that there may be a patient responsible charge related to this service. The patient expressed understanding and verbally consented to this telephonic visit.    Interactive audio and video telecommunications were attempted between this provider and patient, however failed, due to patient having technical difficulties OR patient did not have access to video capability.  We continued and completed visit with audio only.  Some vital signs may be absent or patient reported.   Time Spent with patient on telephone encounter: 20 minutes   Review of Systems     Cardiac Risk Factors include: advanced age (>81mn, >>79women);dyslipidemia;hypertension;sedentary lifestyle     Objective:    Today's Vitals   01/29/21 1103  Weight: 148 lb (67.1 kg)  Height: 5' (1.524 m)   Body mass index is 28.9 kg/m.  Advanced Directives 01/29/2021 08/21/2019 02/06/2019 12/23/2017 11/25/2015 04/25/2015  Does Patient Have a Medical Advance Directive? No No No No No No  Would patient like information on creating a medical advance directive? Yes (MAU/Ambulatory/Procedural Areas - Information given) No - Patient declined No - Patient declined Yes (MAU/Ambulatory/Procedural Areas - Information given) Yes - Educational materials given Yes - Educational materials given    Current Medications (verified) Outpatient Encounter Medications as of 01/29/2021   Medication Sig   acetaminophen (TYLENOL) 650 MG CR tablet Take 1,300 mg by mouth every 8 (eight) hours as needed for pain.   atorvastatin (LIPITOR) 40 MG tablet TAKE 1 TABLET(40 MG) BY MOUTH DAILY   carvedilol (COREG) 3.125 MG tablet TAKE 2 TABLETS(6.25 MG) BY MOUTH TWICE DAILY   famotidine (PEPCID) 40 MG tablet Take 1 tablet (40 mg total) by mouth at bedtime as needed for heartburn or indigestion.   hyoscyamine (ANASPAZ) 0.125 MG TBDP disintergrating tablet Place 1 tablet (0.125 mg total) under the tongue every 4 (four) hours as needed.   oxybutynin (DITROPAN-XL) 10 MG 24 hr tablet TAKE 1 TABLET(10 MG) BY MOUTH AT BEDTIME   timolol (TIMOPTIC) 0.5 % ophthalmic solution Place 1 drop into both eyes at bedtime.   triamterene-hydrochlorothiazide (MAXZIDE-25) 37.5-25 MG tablet TAKE 1 TABLET BY MOUTH DAILY   XARELTO 15 MG TABS tablet TAKE 1 TABLET(15 MG) BY MOUTH DAILY WITH SUPPER   nitroGLYCERIN (NITROSTAT) 0.4 MG SL tablet Place 1 tablet (0.4 mg total) under the tongue every 5 (five) minutes as needed for chest pain.   [DISCONTINUED] COVID-19 mRNA vaccine, Moderna, (MODERNA COVID-19 VACCINE) 100 MCG/0.5ML injection Inject into the muscle.   No facility-administered encounter medications on file as of 01/29/2021.    Allergies (verified) Alphagan [brimonidine]   History: Past Medical History:  Diagnosis Date   Arthritis    Estrogen deficiency 05/27/2016   Great toe pain, left 12/02/2016   H/O measles    H/O mumps    History of chicken pox    Hyperglycemia 05/27/2016   Hyperlipidemia    Hypertension    Patent foramen ovale    Preventative health care 02/26/2015   Renal insufficiency    Thyroid  nodule    Biopsy-negative   TIA (transient ischemic attack)    Urinary incontinence    Past Surgical History:  Procedure Laterality Date   ABDOMINAL HYSTERECTOMY     fibroid   APPENDECTOMY     EYE SURGERY Bilateral    CATARACT REMOVAL   JOINT REPLACEMENT     REPLACEMENT TOTAL KNEE Right  2007   THYROID LOBECTOMY Left 04/25/2015   THYROID LOBECTOMY Left 04/25/2015   Procedure: LEFT THYROID LOBECTOMY;  Surgeon: Armandina Gemma, MD;  Location: [facility];  Service: General;  Laterality: Left;   TRIGGER FINGER RELEASE  2007   Family History  Problem Relation Age of Onset   Heart disease Mother    Hypertension Mother    Kidney disease Sister    Mental illness Sister        suicide, depresion   Gout Brother    Hypertension Son    Cancer Maternal Grandmother    Cancer Sister    Bone cancer Sister    Cancer Sister    Cancer Sister        kidney   Cancer Daughter    Pancreatic cancer Daughter    Thyroid disease Daughter    Thyroid disease Daughter        thyroid nodules   Hypertension Daughter    Irritable bowel syndrome Daughter    Cancer Daughter        thyroid   Cancer Brother    Prostate cancer Brother    Social History   Socioeconomic History   Marital status: Widowed    Spouse name: Not on file   Number of children: 4   Years of education: Not on file   Highest education level: Not on file  Occupational History   Not on file  Tobacco Use   Smoking status: Never   Smokeless tobacco: Never  Substance and Sexual Activity   Alcohol use: No   Drug use: No   Sexual activity: Not Currently    Partners: Male    Comment: no dietary restrictions, lives with 2 daughters  Other Topics Concern   Not on file  Social History Narrative   No dietary restrictions and lives with daughters.   Social Determinants of Health   Financial Resource Strain: Low Risk    Difficulty of Paying Living Expenses: Not hard at all  Food Insecurity: No Food Insecurity   Worried About Charity fundraiser in the Last Year: Never true   [place] in the Last Year: Never true  Transportation Needs: No Transportation Needs   Lack of Transportation (Medical): No   Lack of Transportation (Non-Medical): No  Physical Activity: Inactive   Days of Exercise per Week: 0 days   Minutes of  Exercise per Session: 0 min  Stress: No Stress Concern Present   Feeling of Stress : Not at all  Social Connections: Socially Isolated   Frequency of Communication with Friends and Family: More than three times a week   Frequency of Social Gatherings with Friends and Family: More than three times a week   Attends Religious Services: Never   Marine scientist or Organizations: No   Attends Archivist Meetings: Never   Marital Status: Widowed    Tobacco Counseling Counseling given: Not Answered   Clinical Intake:  Pre-visit preparation completed: Yes  Pain : No/denies pain     BMI - recorded: 28.9 Nutritional Status: BMI 25 -29 Overweight Nutritional Risks: None Diabetes: No  How often do you need to have someone help you when you read instructions, pamphlets, or other written materials from your doctor or pharmacy?: 1 - Never  Diabetic?No  Interpreter Needed?: No  Information entered by :: Caroleen Hamman LPN   Activities of Daily Living In your present state of health, do you have any difficulty performing the following activities: 01/29/2021 09/01/2020  Hearing? N N  Vision? N N  Comment - -  Difficulty concentrating or making decisions? N N  Comment - -  Walking or climbing stairs? Y N  Comment - -  Dressing or bathing? N N  Comment - -  Doing errands, shopping? Y [place] and eating ? Y -  Using the Toilet? N -  In the past six months, have you accidently leaked urine? N -  Do you have problems with loss of bowel control? N -  Managing your Medications? N -  Managing your Finances? N -  Housekeeping or managing your Housekeeping? Y -  Some recent data might be hidden    Patient Care Team: Mosie Lukes, MD as PCP - General (Family Medicine) Stanford Breed Denice Bors, MD as PCP - Cardiology (Cardiology)  Indicate any recent Medical Services you may have received from other than [facility] providers in the past year (date may be  approximate).     Assessment:   This is a routine wellness examination for [place].  Hearing/Vision screen Hearing Screening - Comments:: Son says she does have some hearing loss Vision Screening - Comments:: Wears glasses [facility] exam-20[facility]  Dietary issues and exercise activities discussed: Current Exercise Habits: The patient does not participate in regular exercise at present, Exercise limited by: None identified   Goals Addressed             This Visit's Progress    Maintain current health   On track    Patient Stated       Drink more water & eat healthier       Depression Screen PHQ 2/9 Scores 01/29/2021 09/01/2020 09/01/2020 08/21/2019 08/21/2019 12/23/2017 11/25/2015  PHQ - 2 Score 0 0 0 2 0 0 0  PHQ- 9 Score - 0 - 4 - - -    Fall Risk Fall Risk  01/29/2021 09/01/2020 08/21/2019 04/06/2019 12/23/2017  Falls in the past year? 0 0 0 0 Yes  Comment - - - Emmi Telephone Survey: data to providers prior to load -  Number falls in past yr: 0 - 0 - 1  Injury with Fall? 0 - 0 - No  Risk for fall due to : - - - - -  Follow up Falls prevention discussed - Education provided;Falls prevention discussed - Education provided;Falls prevention discussed    FALL RISK PREVENTION PERTAINING TO THE HOME:  Any stairs in or around the home? Yes  If so, are there any without handrails? No  Home free of loose throw rugs in walkways, pet beds, electrical cords, etc? Yes  Adequate lighting in your home to reduce risk of falls? Yes   ASSISTIVE DEVICES UTILIZED TO PREVENT FALLS:  Life alert? No  Use of a cane, walker or w/c? Yes  Grab bars in the bathroom? Yes  Shower chair or bench in shower? Yes  Elevated toilet seat or a handicapped toilet? No   TIMED UP AND GO:  Was the test performed? No . Phone visit   Cognitive Function:Patient's son states her memory is good. Son  assisted with questions today. MMSE - Mini Mental State Exam 12/23/2017  Not completed: Refused      6CIT Screen 08/21/2019  What Year? 0 points  What month? 0 points  What time? 0 points  Count back from 20 2 points  Months in reverse 4 points  Repeat phrase 2 points  Total Score 8    Immunizations Immunization History  Administered Date(s) Administered   Fluad Quad(high Dose 65+) 02/09/2019, 02/26/2020   Influenza, High Dose Seasonal PF 01/23/2018   Influenza,inj,Quad PF,6+ Mos 02/19/2014, 01/17/2015   Influenza-Unspecified 02/17/2017   Moderna SARS-COV2 Booster Vaccination 09/01/2020   Moderna Sars-Covid-2 Vaccination 11/25/2019, 12/23/2019   Pneumococcal Conjugate-13 11/25/2015   Pneumococcal Polysaccharide-23 05/16/2013    TDAP status: Due, Education has been provided regarding the importance of this vaccine. Advised may receive this vaccine at local pharmacy or Health Dept. Aware to provide a copy of the vaccination record if obtained from local pharmacy or Health Dept. Verbalized acceptance and understanding.  Flu Vaccine status: Due, Education has been provided regarding the importance of this vaccine. Advised may receive this vaccine at local pharmacy or Health Dept. Aware to provide a copy of the vaccination record if obtained from local pharmacy or Health Dept. Verbalized acceptance and understanding.  Pneumococcal vaccine status: Up to date  Covid-19 vaccine status: Information provided on how to obtain vaccines. Booster  Qualifies for Shingles Vaccine? Yes   Zostavax completed No   Shingrix Completed?: No.    Education has been provided regarding the importance of this vaccine. Patient has been advised to call insurance company to determine out of pocket expense if they have not yet received this vaccine. Advised may also receive vaccine at local pharmacy or Health Dept. Verbalized acceptance and understanding.  Screening Tests Health Maintenance  Topic Date Due   TETANUS/TDAP  Never done   Zoster Vaccines- Shingrix (1 of 2) Never done   INFLUENZA VACCINE   12/15/2020   COVID-19 Vaccine (4 - Booster for Moderna series) 01/01/2021   DEXA SCAN  Completed   PNA vac Low Risk Adult  Completed   HPV VACCINES  Aged Out    Health Maintenance  Health Maintenance Due  Topic Date Due   TETANUS/TDAP  Never done   Zoster Vaccines- Shingrix (1 of 2) Never done   INFLUENZA VACCINE  12/15/2020   COVID-19 Vaccine (4 - Booster for Moderna series) 01/01/2021    Colorectal cancer screening: No longer required.   Mammogram status: No longer required due to patient's choice.  Bone Density status: Declined  Lung Cancer Screening: (Low Dose CT Chest recommended if Age 68-80 years, 30 pack-year currently smoking OR have quit w/in 15years.) does not qualify.     Additional Screening:  Hepatitis C Screening: does not qualify  Vision Screening: Recommended annual ophthalmology exams for early detection of glaucoma and other disorders of the eye. Is the patient up to date with their annual eye exam?  Yes  Who is the provider or what is the name of the office in which the patient attends annual eye exams? [facility]   Dental Screening: Recommended annual dental exams for proper oral hygiene  Community Resource Referral / Chronic Care Management: CRR required this visit?  No   CCM required this visit?  No      Plan:     I have personally reviewed and noted the following in the patient's chart:   Medical and social history Use of alcohol, tobacco or illicit drugs  Current medications and supplements including opioid prescriptions.  Functional ability and status Nutritional status Physical activity Advanced directives List of other physicians Hospitalizations, surgeries, and ER visits in previous 12 months Vitals Screenings to include cognitive, depression, and falls Referrals and appointments  In addition, I have reviewed and discussed with patient certain preventive protocols, quality metrics, and best practice recommendations. A  written personalized care plan for preventive services as well as general preventive health recommendations were provided to patient.   Due to this being a telephonic visit, the after visit summary with patients personalized plan was offered to patient via mail or my-chart. per request, patient was mailed a copy of [place], LPN   [id number]  Nurse Health Advisor  Nurse Notes: None

## 2021-03-09 ENCOUNTER — Other Ambulatory Visit: Payer: Self-pay

## 2021-03-09 ENCOUNTER — Encounter: Payer: Self-pay | Admitting: Family Medicine

## 2021-03-09 ENCOUNTER — Ambulatory Visit (INDEPENDENT_AMBULATORY_CARE_PROVIDER_SITE_OTHER): Payer: Medicare Other | Admitting: Family Medicine

## 2021-03-09 VITALS — BP 120/62 | HR 62 | Temp 98.4°F | Resp 16

## 2021-03-09 DIAGNOSIS — I10 Essential (primary) hypertension: Secondary | ICD-10-CM | POA: Diagnosis not present

## 2021-03-09 DIAGNOSIS — R739 Hyperglycemia, unspecified: Secondary | ICD-10-CM

## 2021-03-09 DIAGNOSIS — E559 Vitamin D deficiency, unspecified: Secondary | ICD-10-CM | POA: Diagnosis not present

## 2021-03-09 DIAGNOSIS — Z23 Encounter for immunization: Secondary | ICD-10-CM

## 2021-03-09 DIAGNOSIS — E7800 Pure hypercholesterolemia, unspecified: Secondary | ICD-10-CM | POA: Diagnosis not present

## 2021-03-09 DIAGNOSIS — N289 Disorder of kidney and ureter, unspecified: Secondary | ICD-10-CM

## 2021-03-09 NOTE — Assessment & Plan Note (Signed)
Supplement and monitor 

## 2021-03-09 NOTE — Assessment & Plan Note (Signed)
Encourage heart healthy diet such as MIND or DASH diet, increase exercise, avoid trans fats, simple carbohydrates and processed foods, consider a krill or fish or flaxseed oil cap daily.  °

## 2021-03-09 NOTE — Patient Instructions (Addendum)
Everyone needs the new Bivalent COVID booster it has only been out a few months. Protects better against new strains   Paxlovid and Molnupiravir are the new COVID medication we can give you if you get COVID so make sure you test if you have symptoms because we have to treat by day 5 of symptoms for it to be effective. If you are positive let us know so we can treat. If a home test is negative and your symptoms are persistent get a PCR test. Can check testing locations at [email or web address] If you are positive we will make an appointment with [place] and we will send in Paxlovid or Molnupiravir if you would like it. Check with your pharmacy before we meet to confirm they have it in stock, if they do not then we can get the prescription at the [facility]

## 2021-03-09 NOTE — Progress Notes (Signed)
Subjective:   By signing my name below, I, [name], attest that this documentation has been prepared under the direction and in the presence of Mosie Lukes, MD. 03/09/2021    Patient ID: Holly Nunez, female    DOB: [birth year], 87 y.o.   MRN: [id number]  Chief Complaint  Patient presents with   6 months follow up    HPI Patient is in today for an office visit and 6 month f/u. She is accompanied by her daughter today.  She reports she is doing well.   She denies urinary problems, N/V/D, loss of appetite and bowel problems.   She received the flu vaccine today.   Past Medical History:  Diagnosis Date   Arthritis    Estrogen deficiency 05/27/2016   Great toe pain, left 12/02/2016   H/O measles    H/O mumps    History of chicken pox    Hyperglycemia 05/27/2016   Hyperlipidemia    Hypertension    Patent foramen ovale    Preventative health care 02/26/2015   Renal insufficiency    Thyroid nodule    Biopsy-negative   TIA (transient ischemic attack)    Urinary incontinence     Past Surgical History:  Procedure Laterality Date   ABDOMINAL HYSTERECTOMY     fibroid   APPENDECTOMY     EYE SURGERY Bilateral    CATARACT REMOVAL   JOINT REPLACEMENT     REPLACEMENT TOTAL KNEE Right 2007   THYROID LOBECTOMY Left 04/25/2015   THYROID LOBECTOMY Left 04/25/2015   Procedure: LEFT THYROID LOBECTOMY;  Surgeon: Armandina Gemma, MD;  Location: [facility];  Service: General;  Laterality: Left;   TRIGGER FINGER RELEASE  2007    Family History  Problem Relation Age of Onset   Heart disease Mother    Hypertension Mother    Kidney disease Sister    Mental illness Sister        suicide, depresion   Gout Brother    Hypertension Son    Cancer Maternal Grandmother    Cancer Sister    Bone cancer Sister    Cancer Sister    Cancer Sister        kidney   Cancer Daughter    Pancreatic cancer Daughter    Thyroid disease Daughter    Thyroid disease Daughter        thyroid nodules    Hypertension Daughter    Irritable bowel syndrome Daughter    Cancer Daughter        thyroid   Cancer Brother    Prostate cancer Brother     Social History   Socioeconomic History   Marital status: Widowed    Spouse name: Not on file   Number of children: 4   Years of education: Not on file   Highest education level: Not on file  Occupational History   Not on file  Tobacco Use   Smoking status: Never   Smokeless tobacco: Never  Substance and Sexual Activity   Alcohol use: No   Drug use: No   Sexual activity: Not Currently    Partners: Male    Comment: no dietary restrictions, lives with 2 daughters  Other Topics Concern   Not on file  Social History Narrative   No dietary restrictions and lives with daughters.   Social Determinants of Health   Financial Resource Strain: Low Risk    Difficulty of Paying Living Expenses: Not hard at all  Food Insecurity: No Food Insecurity  Worried About Charity fundraiser in the Last Year: Never true   [place] in the Last Year: Never true  Transportation Needs: No Transportation Needs   Lack of Transportation (Medical): No   Lack of Transportation (Non-Medical): No  Physical Activity: Inactive   Days of Exercise per Week: 0 days   Minutes of Exercise per Session: 0 min  Stress: No Stress Concern Present   Feeling of Stress : Not at all  Social Connections: Socially Isolated   Frequency of Communication with Friends and Family: More than three times a week   Frequency of Social Gatherings with Friends and Family: More than three times a week   Attends Religious Services: Never   Marine scientist or Organizations: No   Attends Archivist Meetings: Never   Marital Status: Widowed  Human resources officer Violence: Not At Risk   Fear of Current or Ex-Partner: No   Emotionally Abused: No   Physically Abused: No   Sexually Abused: No    Outpatient Medications Prior to Visit  Medication Sig Dispense Refill    acetaminophen (TYLENOL) 650 MG CR tablet Take 1,300 mg by mouth every 8 (eight) hours as needed for pain.     atorvastatin (LIPITOR) 40 MG tablet TAKE 1 TABLET(40 MG) BY MOUTH DAILY 90 tablet 1   carvedilol (COREG) 3.125 MG tablet TAKE 2 TABLETS(6.25 MG) BY MOUTH TWICE DAILY 360 tablet 1   famotidine (PEPCID) 40 MG tablet Take 1 tablet (40 mg total) by mouth at bedtime as needed for heartburn or indigestion. 30 tablet 5   hyoscyamine (ANASPAZ) 0.125 MG TBDP disintergrating tablet Place 1 tablet (0.125 mg total) under the tongue every 4 (four) hours as needed. 30 tablet 1   oxybutynin (DITROPAN-XL) 10 MG 24 hr tablet TAKE 1 TABLET(10 MG) BY MOUTH AT BEDTIME 90 tablet 1   timolol (TIMOPTIC) 0.5 % ophthalmic solution Place 1 drop into both eyes at bedtime.  6   triamterene-hydrochlorothiazide (MAXZIDE-25) 37.5-25 MG tablet TAKE 1 TABLET BY MOUTH DAILY 90 tablet 1   XARELTO 15 MG TABS tablet TAKE 1 TABLET(15 MG) BY MOUTH DAILY WITH SUPPER 90 tablet 1   nitroGLYCERIN (NITROSTAT) 0.4 MG SL tablet Place 1 tablet (0.4 mg total) under the tongue every 5 (five) minutes as needed for chest pain. 25 tablet 11   No facility-administered medications prior to visit.    Allergies  Allergen Reactions   Alphagan [Brimonidine] Other (See Comments)    Secondary infection and vision changes    Review of Systems  Constitutional:  Negative for fever and malaise/fatigue.  HENT:  Negative for congestion.   Eyes:  Negative for redness.  Respiratory:  Negative for shortness of breath.   Cardiovascular:  Negative for chest pain, palpitations and leg swelling.  Gastrointestinal:  Negative for abdominal pain, blood in stool and nausea.  Genitourinary:  Negative for dysuria and frequency.  Musculoskeletal:  Negative for falls.  Skin:  Negative for rash.  Neurological:  Negative for dizziness, loss of consciousness and headaches.  Endo/Heme/Allergies:  Negative for polydipsia.  Psychiatric/Behavioral:  Negative for  depression. The patient is not nervous/anxious.       Objective:    Physical Exam Constitutional:      General: She is not in acute distress.    Appearance: She is well-developed.  HENT:     Head: Normocephalic and atraumatic.  Eyes:     Conjunctiva/sclera: Conjunctivae normal.  Neck:     Thyroid: No thyromegaly.  Cardiovascular:     Rate and Rhythm: Normal rate and regular rhythm.     Heart sounds: Normal heart sounds. No murmur heard. Pulmonary:     Effort: Pulmonary effort is normal. No respiratory distress.     Breath sounds: Normal breath sounds.  Abdominal:     General: Bowel sounds are normal. There is no distension.     Palpations: Abdomen is soft. There is no mass.     Tenderness: There is no abdominal tenderness.  Musculoskeletal:     Cervical back: Neck supple.  Lymphadenopathy:     Cervical: No cervical adenopathy.  Skin:    General: Skin is warm and dry.  Neurological:     Mental Status: She is alert and oriented to person, place, and time.  Psychiatric:        Behavior: Behavior normal.    BP 120/62   Pulse 62   Temp 98.4 F (36.9 C)   Resp 16   SpO2 99%  Wt Readings from Last 3 Encounters:  01/29/21 148 lb (67.1 kg)  10/01/20 148 lb (67.1 kg)  09/01/20 123 lb 6.4 oz (56 kg)    Diabetic Foot Exam - Simple   No data filed    Lab Results  Component Value Date   WBC 4.9 09/01/2020   HGB 11.4 (L) 09/01/2020   HCT 33.5 (L) 09/01/2020   PLT 180.0 09/01/2020   GLUCOSE 108 (H) 09/01/2020   CHOL 137 09/01/2020   TRIG 88.0 09/01/2020   HDL 75.20 09/01/2020   LDLCALC 44 09/01/2020   ALT 19 09/01/2020   AST 20 09/01/2020   NA 140 09/01/2020   K 4.4 09/01/2020   CL 103 09/01/2020   CREATININE 1.22 (H) 09/01/2020   BUN 27 (H) 09/01/2020   CO2 30 09/01/2020   TSH 1.72 09/01/2020   INR 1.13 04/25/2015   HGBA1C 5.6 09/01/2020   MICROALBUR 2.2 (H) 01/23/2018    Lab Results  Component Value Date   TSH 1.72 09/01/2020   Lab Results   Component Value Date   WBC 4.9 09/01/2020   HGB 11.4 (L) 09/01/2020   HCT 33.5 (L) 09/01/2020   MCV 96.5 09/01/2020   PLT 180.0 09/01/2020   Lab Results  Component Value Date   NA 140 09/01/2020   K 4.4 09/01/2020   CO2 30 09/01/2020   GLUCOSE 108 (H) 09/01/2020   BUN 27 (H) 09/01/2020   CREATININE 1.22 (H) 09/01/2020   BILITOT 0.7 09/01/2020   ALKPHOS 52 09/01/2020   AST 20 09/01/2020   ALT 19 09/01/2020   PROT 6.8 09/01/2020   ALBUMIN 3.9 09/01/2020   CALCIUM 10.0 09/01/2020   ANIONGAP 13 02/06/2019   GFR 38.25 (L) 09/01/2020   Lab Results  Component Value Date   CHOL 137 09/01/2020   Lab Results  Component Value Date   HDL 75.20 09/01/2020   Lab Results  Component Value Date   LDLCALC 44 09/01/2020   Lab Results  Component Value Date   TRIG 88.0 09/01/2020   Lab Results  Component Value Date   CHOLHDL 2 09/01/2020   Lab Results  Component Value Date   HGBA1C 5.6 09/01/2020       Assessment & Plan:   Problem List Items Addressed This Visit     HTN (hypertension)    Well controlled, no changes to meds. Encouraged heart healthy diet such as the DASH diet and exercise as tolerated.       Relevant Orders   CBC  with Differential/Platelet   Comprehensive metabolic panel   Lipid panel   TSH   Hyperlipidemia    Encourage heart healthy diet such as MIND or DASH diet, increase exercise, avoid trans fats, simple carbohydrates and processed foods, consider a krill or fish or flaxseed oil cap daily.       Relevant Orders   CBC with Differential/Platelet   Comprehensive metabolic panel   Lipid panel   TSH   Renal insufficiency    Hydrate and monitor      Hyperglycemia    hgba1c acceptable, minimize simple carbs. Increase exercise as tolerated.       Relevant Orders   Hemoglobin A1c   Vitamin D deficiency    Supplement and monitor      Relevant Orders   VITAMIN D 25 Hydroxy (Vit-D Deficiency, Fractures)   Other Visit Diagnoses     Need  for influenza vaccination    -  Primary   Relevant Orders   Flu Vaccine QUAD High Dose(Fluad) (Completed)       No orders of the defined types were placed in this encounter.   I,[name],acting as a Education administrator for Penni Homans, MD.,have documented all relevant documentation on the behalf of Penni Homans, MD,as directed by  Penni Homans, MD while in the presence of Penni Homans, MD.   I, Penni Homans, MD, personally preformed the services described in this documentation.  All medical record entries made by the scribe were at my direction and in my presence.  I have reviewed the chart and discharge instructions (if applicable) and agree that the record reflects my personal performance and is accurate and complete. 03/09/2021

## 2021-03-09 NOTE — Assessment & Plan Note (Signed)
Well controlled, no changes to meds. Encouraged heart healthy diet such as the DASH diet and exercise as tolerated.  °

## 2021-03-09 NOTE — Assessment & Plan Note (Signed)
hgba1c acceptable, minimize simple carbs. Increase exercise as tolerated.  

## 2021-03-09 NOTE — Assessment & Plan Note (Signed)
Hydrate and monitor 

## 2021-03-11 ENCOUNTER — Emergency Department (HOSPITAL_BASED_OUTPATIENT_CLINIC_OR_DEPARTMENT_OTHER)
Admission: EM | Admit: 2021-03-11 | Discharge: 2021-03-11 | Disposition: A | Discharge status: Home / Self Care | Payer: Medicare Other | Attending: Emergency Medicine | Admitting: Emergency Medicine

## 2021-03-11 ENCOUNTER — Encounter (HOSPITAL_BASED_OUTPATIENT_CLINIC_OR_DEPARTMENT_OTHER): Payer: Self-pay

## 2021-03-11 ENCOUNTER — Other Ambulatory Visit: Payer: Self-pay

## 2021-03-11 DIAGNOSIS — S40811A Abrasion of right upper arm, initial encounter: Secondary | ICD-10-CM | POA: Diagnosis not present

## 2021-03-11 DIAGNOSIS — S4992XA Unspecified injury of left shoulder and upper arm, initial encounter: Secondary | ICD-10-CM | POA: Diagnosis present

## 2021-03-11 DIAGNOSIS — W19XXXA Unspecified fall, initial encounter: Secondary | ICD-10-CM | POA: Insufficient documentation

## 2021-03-11 DIAGNOSIS — I10 Essential (primary) hypertension: Secondary | ICD-10-CM | POA: Diagnosis not present

## 2021-03-11 DIAGNOSIS — Z96651 Presence of right artificial knee joint: Secondary | ICD-10-CM | POA: Insufficient documentation

## 2021-03-11 DIAGNOSIS — Y92009 Unspecified place in unspecified non-institutional (private) residence as the place of occurrence of the external cause: Secondary | ICD-10-CM | POA: Diagnosis not present

## 2021-03-11 DIAGNOSIS — Z7901 Long term (current) use of anticoagulants: Secondary | ICD-10-CM | POA: Diagnosis not present

## 2021-03-11 DIAGNOSIS — Z79899 Other long term (current) drug therapy: Secondary | ICD-10-CM | POA: Insufficient documentation

## 2021-03-11 DIAGNOSIS — S50811A Abrasion of right forearm, initial encounter: Secondary | ICD-10-CM | POA: Diagnosis not present

## 2021-03-11 NOTE — ED Provider Notes (Signed)
[facility] Provider Note: Holly Spurling, MD, FACEP  CSN: [id number] MRN: [id number] ARRIVAL: 03/11/21 at 2207 ROOM: [place]  Fall   HISTORY OF PRESENT ILLNESS  03/11/21 11:14 PM Holly Nunez is a 87 y.o. female who had an unwitnessed fall in her bedroom about 8 PM.  She states she was trying to retrieve it dropped a battery and at the same time turn on the TV to watch "everybody loves Ramen" and fell.  She was found facedown on a pillow with her right arm over her walker.  She did not hit her head and remembers the fall.  She does not have any current pain apart from a superficial abrasion of the right forearm just proximal to the elbow.  She is on Xarelto.   Past Medical History:  Diagnosis Date   Arthritis    Estrogen deficiency 05/27/2016   Great toe pain, left 12/02/2016   H/O measles    H/O mumps    History of chicken pox    Hyperglycemia 05/27/2016   Hyperlipidemia    Hypertension    Patent foramen ovale    Preventative health care 02/26/2015   Renal insufficiency    Thyroid nodule    Biopsy-negative   TIA (transient ischemic attack)    Urinary incontinence     Past Surgical History:  Procedure Laterality Date   ABDOMINAL HYSTERECTOMY     fibroid   APPENDECTOMY     EYE SURGERY Bilateral    CATARACT REMOVAL   JOINT REPLACEMENT     REPLACEMENT TOTAL KNEE Right 2007   THYROID LOBECTOMY Left 04/25/2015   THYROID LOBECTOMY Left 04/25/2015   Procedure: LEFT THYROID LOBECTOMY;  Surgeon: Armandina Gemma, MD;  Location: [facility];  Service: General;  Laterality: Left;   TRIGGER FINGER RELEASE  2007    Family History  Problem Relation Age of Onset   Heart disease Mother    Hypertension Mother    Kidney disease Sister    Mental illness Sister        suicide, depresion   Gout Brother    Hypertension Son    Cancer Maternal Grandmother    Cancer Sister    Bone cancer Sister    Cancer Sister    Cancer Sister        kidney   Cancer  Daughter    Pancreatic cancer Daughter    Thyroid disease Daughter    Thyroid disease Daughter        thyroid nodules   Hypertension Daughter    Irritable bowel syndrome Daughter    Cancer Daughter        thyroid   Cancer Brother    Prostate cancer Brother     Social History   Tobacco Use   Smoking status: Never   Smokeless tobacco: Never  Vaping Use   Vaping Use: Never used  Substance Use Topics   Alcohol use: No   Drug use: No    Prior to Admission medications   Medication Sig Start Date End Date Taking? Authorizing Provider  acetaminophen (TYLENOL) 650 MG CR tablet Take 1,300 mg by mouth every 8 (eight) hours as needed for pain.    [provider]  atorvastatin (LIPITOR) 40 MG tablet TAKE 1 TABLET(40 MG) BY MOUTH DAILY 01/22/21   Mosie Lukes, MD  carvedilol (COREG) 3.125 MG tablet TAKE 2 TABLETS(6.25 MG) BY MOUTH TWICE DAILY 01/22/21   Mosie Lukes, MD  famotidine (PEPCID) 40 MG tablet  Take 1 tablet (40 mg total) by mouth at bedtime as needed for heartburn or indigestion. 02/26/20   Mosie Lukes, MD  hyoscyamine (ANASPAZ) 0.125 MG TBDP disintergrating tablet Place 1 tablet (0.125 mg total) under the tongue every 4 (four) hours as needed. 09/01/20   Mosie Lukes, MD  nitroGLYCERIN (NITROSTAT) 0.4 MG SL tablet Place 1 tablet (0.4 mg total) under the tongue every 5 (five) minutes as needed for chest pain. 10/01/20 12/30/20  Lelon Perla, MD  oxybutynin (DITROPAN-XL) 10 MG 24 hr tablet TAKE 1 TABLET(10 MG) BY MOUTH AT BEDTIME 10/24/20   Mosie Lukes, MD  timolol (TIMOPTIC) 0.5 % ophthalmic solution Place 1 drop into both eyes at bedtime. 06/14/17   [provider]  triamterene-hydrochlorothiazide (MAXZIDE-25) 37.5-25 MG tablet TAKE 1 TABLET BY MOUTH DAILY 01/22/21   Mosie Lukes, MD  XARELTO 15 MG TABS tablet TAKE 1 TABLET(15 MG) BY MOUTH DAILY WITH SUPPER 01/22/21   Lelon Perla, MD    Allergies Alphagan [brimonidine]   REVIEW OF SYSTEMS   Negative except as noted here or in the History of Present Illness.   PHYSICAL EXAMINATION  Initial Vital Signs Blood pressure (!) 149/76, pulse 66, temperature 98 F (36.7 C), temperature source Oral, resp. rate 20, SpO2 100 %.  Examination General: Well-developed, well-nourished female in no acute distress; appears younger than age of record HENT: normocephalic; atraumatic; TMs normal Eyes: pupils equal, round and reactive to light; extraocular muscles intact; bilateral pseudophakia Neck: supple; nontender Heart: regular rate and rhythm Lungs: clear to auscultation bilaterally Abdomen: soft; nondistended; nontender; bowel sounds present Extremities: Arthritic changes; no pain on passive movement of extremities; edema of lower legs Neurologic: Awake, alert and oriented; motor function intact in all extremities and symmetric; no facial droop Skin: Warm and dry; superficial abrasion right upper arm Psychiatric: Normal mood and affect   RESULTS  Summary of this visit's results, reviewed and interpreted by myself:   EKG Interpretation  Date/Time:    Ventricular Rate:    PR Interval:    QRS Duration:   QT Interval:    QTC Calculation:   R Axis:     Text Interpretation:         Laboratory Studies: No results found for this or any previous visit (from the past 24 hour(s)). Imaging Studies: No results found.  ED COURSE and MDM  Nursing notes, initial and subsequent vitals signs, including pulse oximetry, reviewed and interpreted by myself.  Vitals:   03/11/21 2221  BP: (!) 149/76  Pulse: 66  Resp: 20  Temp: 98 F (36.7 C)  TempSrc: Oral  SpO2: 100%   Medications - No data to display  The patient has no evidence of head trauma.  She denies striking her head when she fell and is awake and alert.  I do not believe any imaging studies are indicated at this time.  Her daughter was advised of concerning signs and symptoms that would warrant return.  PROCEDURES   Procedures   ED DIAGNOSES     ICD-10-CM   1. Fall at home, initial encounter  W19.XXXA    Y92.009     2. Abrasion of skin of right upper arm  S40.811A          Czar Ysaguirre, Jenny Reichmann, MD 03/11/21 2330

## 2021-03-11 NOTE — ED Triage Notes (Signed)
Per granddaughter pt fell in her BR ~8p-unwitnessed fall-was found face down with pillow under her face-states she did have LOC-EMS was at scene-refused EMS-concerned due to pt is on blood thinners

## 2021-03-12 ENCOUNTER — Other Ambulatory Visit: Payer: Medicare Other

## 2021-03-19 ENCOUNTER — Other Ambulatory Visit: Payer: Self-pay

## 2021-03-19 ENCOUNTER — Other Ambulatory Visit (INDEPENDENT_AMBULATORY_CARE_PROVIDER_SITE_OTHER): Payer: Medicare Other

## 2021-03-19 DIAGNOSIS — E7800 Pure hypercholesterolemia, unspecified: Secondary | ICD-10-CM | POA: Diagnosis not present

## 2021-03-19 DIAGNOSIS — I10 Essential (primary) hypertension: Secondary | ICD-10-CM | POA: Diagnosis not present

## 2021-03-19 DIAGNOSIS — E559 Vitamin D deficiency, unspecified: Secondary | ICD-10-CM

## 2021-03-19 DIAGNOSIS — R739 Hyperglycemia, unspecified: Secondary | ICD-10-CM | POA: Diagnosis not present

## 2021-03-20 LAB — COMPREHENSIVE METABOLIC PANEL
ALT: 17 U/L (ref 0–35)
AST: 21 U/L (ref 0–37)
Albumin: 3.9 g/dL (ref 3.5–5.2)
Alkaline Phosphatase: 57 U/L (ref 39–117)
BUN: 23 mg/dL (ref 6–23)
CO2: 29 mEq/L (ref 19–32)
Calcium: 9.6 mg/dL (ref 8.4–10.5)
Chloride: 102 mEq/L (ref 96–112)
Creatinine, Ser: 1.44 mg/dL — ABNORMAL HIGH (ref 0.40–1.20)
GFR: 31.23 mL/min — ABNORMAL LOW (ref >60.00)
Glucose, Bld: 160 mg/dL — ABNORMAL HIGH (ref 70–99)
Potassium: 4.3 mEq/L (ref 3.5–5.1)
Sodium: 138 mEq/L (ref 135–145)
Total Bilirubin: 0.8 mg/dL (ref 0.2–1.2)
Total Protein: 6.5 g/dL (ref 6.0–8.3)

## 2021-03-20 LAB — VITAMIN D 25 HYDROXY (VIT D DEFICIENCY, FRACTURES): VITD: 30.94 ng/mL (ref 30.00–100.00)

## 2021-03-20 LAB — CBC WITH DIFFERENTIAL/PLATELET
Basophils Absolute: 0 10*3/uL (ref 0.0–0.1)
Basophils Relative: 0.9 % (ref 0.0–3.0)
Eosinophils Absolute: 0 10*3/uL (ref 0.0–0.7)
Eosinophils Relative: 0.7 % (ref 0.0–5.0)
HCT: 34.1 % — ABNORMAL LOW (ref 36.0–46.0)
Hemoglobin: 11.4 g/dL — ABNORMAL LOW (ref 12.0–15.0)
Lymphocytes Relative: 20.8 % (ref 12.0–46.0)
Lymphs Abs: 0.9 10*3/uL (ref 0.7–4.0)
MCHC: 33.5 g/dL (ref 30.0–36.0)
MCV: 96.5 fl (ref 78.0–100.0)
Monocytes Absolute: 0.4 10*3/uL (ref 0.1–1.0)
Monocytes Relative: 9.4 % (ref 3.0–12.0)
Neutro Abs: 2.9 10*3/uL (ref 1.4–7.7)
Neutrophils Relative %: 68.2 % (ref 43.0–77.0)
Platelets: 149 10*3/uL — ABNORMAL LOW (ref 150.0–400.0)
RBC: 3.53 Mil/uL — ABNORMAL LOW (ref 3.87–5.11)
RDW: 13.8 % (ref 11.5–15.5)
WBC: 4.2 10*3/uL (ref 4.0–10.5)

## 2021-03-20 LAB — LIPID PANEL
Cholesterol: 133 mg/dL (ref 0–200)
HDL: 72.7 mg/dL (ref >39.00)
LDL Cholesterol: 49 mg/dL (ref 0–99)
NonHDL: 60.49
Total CHOL/HDL Ratio: 2
Triglycerides: 56 mg/dL (ref 0.0–149.0)
VLDL: 11.2 mg/dL (ref 0.0–40.0)

## 2021-03-20 LAB — HEMOGLOBIN A1C: Hgb A1c MFr Bld: 5.9 % (ref 4.6–6.5)

## 2021-03-20 LAB — TSH: TSH: 1.6 u[IU]/mL (ref 0.35–5.50)

## 2021-03-23 ENCOUNTER — Other Ambulatory Visit: Payer: Self-pay

## 2021-03-23 DIAGNOSIS — N289 Disorder of kidney and ureter, unspecified: Secondary | ICD-10-CM

## 2021-05-05 ENCOUNTER — Other Ambulatory Visit: Payer: Self-pay | Admitting: Family Medicine

## 2021-05-14 ENCOUNTER — Observation Stay (HOSPITAL_BASED_OUTPATIENT_CLINIC_OR_DEPARTMENT_OTHER)
Admission: EM | Admit: 2021-05-14 | Discharge: 2021-05-18 | Disposition: A | Discharge status: Skilled Nursing Facility | Payer: Medicare Other | Attending: Family Medicine | Admitting: Family Medicine

## 2021-05-14 ENCOUNTER — Emergency Department (HOSPITAL_BASED_OUTPATIENT_CLINIC_OR_DEPARTMENT_OTHER): Payer: Medicare Other

## 2021-05-14 ENCOUNTER — Other Ambulatory Visit: Payer: Self-pay

## 2021-05-14 ENCOUNTER — Encounter (HOSPITAL_BASED_OUTPATIENT_CLINIC_OR_DEPARTMENT_OTHER): Payer: Self-pay

## 2021-05-14 DIAGNOSIS — W19XXXA Unspecified fall, initial encounter: Secondary | ICD-10-CM | POA: Diagnosis present

## 2021-05-14 DIAGNOSIS — Z043 Encounter for examination and observation following other accident: Secondary | ICD-10-CM | POA: Diagnosis not present

## 2021-05-14 DIAGNOSIS — Z8673 Personal history of transient ischemic attack (TIA), and cerebral infarction without residual deficits: Secondary | ICD-10-CM | POA: Diagnosis not present

## 2021-05-14 DIAGNOSIS — Z7901 Long term (current) use of anticoagulants: Secondary | ICD-10-CM | POA: Diagnosis not present

## 2021-05-14 DIAGNOSIS — Z79899 Other long term (current) drug therapy: Secondary | ICD-10-CM | POA: Diagnosis not present

## 2021-05-14 DIAGNOSIS — S0990XA Unspecified injury of head, initial encounter: Secondary | ICD-10-CM | POA: Diagnosis not present

## 2021-05-14 DIAGNOSIS — R519 Headache, unspecified: Secondary | ICD-10-CM | POA: Diagnosis not present

## 2021-05-14 DIAGNOSIS — R5381 Other malaise: Secondary | ICD-10-CM

## 2021-05-14 DIAGNOSIS — N183 Chronic kidney disease, stage 3 unspecified: Secondary | ICD-10-CM | POA: Diagnosis present

## 2021-05-14 DIAGNOSIS — R4189 Other symptoms and signs involving cognitive functions and awareness: Secondary | ICD-10-CM

## 2021-05-14 DIAGNOSIS — D538 Other specified nutritional anemias: Secondary | ICD-10-CM | POA: Diagnosis not present

## 2021-05-14 DIAGNOSIS — R9431 Abnormal electrocardiogram [ECG] [EKG]: Secondary | ICD-10-CM | POA: Diagnosis not present

## 2021-05-14 DIAGNOSIS — N390 Urinary tract infection, site not specified: Secondary | ICD-10-CM | POA: Insufficient documentation

## 2021-05-14 DIAGNOSIS — I10 Essential (primary) hypertension: Secondary | ICD-10-CM | POA: Insufficient documentation

## 2021-05-14 DIAGNOSIS — R8281 Pyuria: Secondary | ICD-10-CM

## 2021-05-14 DIAGNOSIS — W06XXXA Fall from bed, initial encounter: Secondary | ICD-10-CM | POA: Insufficient documentation

## 2021-05-14 DIAGNOSIS — I4891 Unspecified atrial fibrillation: Secondary | ICD-10-CM

## 2021-05-14 DIAGNOSIS — M545 Low back pain, unspecified: Principal | ICD-10-CM | POA: Insufficient documentation

## 2021-05-14 DIAGNOSIS — S301XXA Contusion of abdominal wall, initial encounter: Secondary | ICD-10-CM | POA: Diagnosis not present

## 2021-05-14 DIAGNOSIS — Z20822 Contact with and (suspected) exposure to covid-19: Secondary | ICD-10-CM | POA: Insufficient documentation

## 2021-05-14 DIAGNOSIS — Z96651 Presence of right artificial knee joint: Secondary | ICD-10-CM | POA: Insufficient documentation

## 2021-05-14 DIAGNOSIS — N1831 Chronic kidney disease, stage 3a: Secondary | ICD-10-CM | POA: Diagnosis present

## 2021-05-14 DIAGNOSIS — D649 Anemia, unspecified: Secondary | ICD-10-CM | POA: Diagnosis not present

## 2021-05-14 DIAGNOSIS — E785 Hyperlipidemia, unspecified: Secondary | ICD-10-CM | POA: Diagnosis present

## 2021-05-14 DIAGNOSIS — M48061 Spinal stenosis, lumbar region without neurogenic claudication: Secondary | ICD-10-CM | POA: Diagnosis not present

## 2021-05-14 DIAGNOSIS — I700 Atherosclerosis of aorta: Secondary | ICD-10-CM | POA: Diagnosis not present

## 2021-05-14 DIAGNOSIS — J90 Pleural effusion, not elsewhere classified: Secondary | ICD-10-CM | POA: Diagnosis not present

## 2021-05-14 DIAGNOSIS — E7800 Pure hypercholesterolemia, unspecified: Secondary | ICD-10-CM

## 2021-05-14 DIAGNOSIS — I480 Paroxysmal atrial fibrillation: Secondary | ICD-10-CM | POA: Insufficient documentation

## 2021-05-14 DIAGNOSIS — Y92009 Unspecified place in unspecified non-institutional (private) residence as the place of occurrence of the external cause: Secondary | ICD-10-CM | POA: Diagnosis not present

## 2021-05-14 DIAGNOSIS — I672 Cerebral atherosclerosis: Secondary | ICD-10-CM | POA: Diagnosis not present

## 2021-05-14 DIAGNOSIS — K862 Cyst of pancreas: Secondary | ICD-10-CM | POA: Diagnosis not present

## 2021-05-14 DIAGNOSIS — K863 Pseudocyst of pancreas: Secondary | ICD-10-CM

## 2021-05-14 DIAGNOSIS — M5136 Other intervertebral disc degeneration, lumbar region: Secondary | ICD-10-CM | POA: Diagnosis not present

## 2021-05-14 DIAGNOSIS — M7981 Nontraumatic hematoma of soft tissue: Secondary | ICD-10-CM | POA: Insufficient documentation

## 2021-05-14 DIAGNOSIS — R109 Unspecified abdominal pain: Secondary | ICD-10-CM | POA: Diagnosis not present

## 2021-05-14 DIAGNOSIS — K8689 Other specified diseases of pancreas: Secondary | ICD-10-CM

## 2021-05-14 DIAGNOSIS — R32 Unspecified urinary incontinence: Secondary | ICD-10-CM | POA: Diagnosis present

## 2021-05-14 DIAGNOSIS — M549 Dorsalgia, unspecified: Secondary | ICD-10-CM | POA: Diagnosis not present

## 2021-05-14 DIAGNOSIS — N1832 Chronic kidney disease, stage 3b: Secondary | ICD-10-CM | POA: Diagnosis not present

## 2021-05-14 DIAGNOSIS — R531 Weakness: Secondary | ICD-10-CM | POA: Diagnosis not present

## 2021-05-14 DIAGNOSIS — T45515A Adverse effect of anticoagulants, initial encounter: Secondary | ICD-10-CM

## 2021-05-14 DIAGNOSIS — N281 Cyst of kidney, acquired: Secondary | ICD-10-CM | POA: Diagnosis not present

## 2021-05-14 HISTORY — DX: Contusion of abdominal wall, initial encounter: S30.1XXA

## 2021-05-14 LAB — URINALYSIS, ROUTINE W REFLEX MICROSCOPIC
Bilirubin Urine: NEGATIVE
Glucose, UA: NEGATIVE mg/dL
Ketones, ur: NEGATIVE mg/dL
Nitrite: POSITIVE — AB
Protein, ur: 100 mg/dL — AB
Specific Gravity, Urine: 1.02 (ref 1.005–1.030)
pH: 6.5 (ref 5.0–8.0)

## 2021-05-14 LAB — CBC WITH DIFFERENTIAL/PLATELET
Abs Immature Granulocytes: 0.01 10*3/uL (ref 0.00–0.07)
Basophils Absolute: 0 10*3/uL (ref 0.0–0.1)
Basophils Relative: 0 %
Eosinophils Absolute: 0 10*3/uL (ref 0.0–0.5)
Eosinophils Relative: 1 %
HCT: 31.2 % — ABNORMAL LOW (ref 36.0–46.0)
Hemoglobin: 10.4 g/dL — ABNORMAL LOW (ref 12.0–15.0)
Immature Granulocytes: 0 %
Lymphocytes Relative: 15 %
Lymphs Abs: 0.8 10*3/uL (ref 0.7–4.0)
MCH: 32.5 pg (ref 26.0–34.0)
MCHC: 33.3 g/dL (ref 30.0–36.0)
MCV: 97.5 fL (ref 80.0–100.0)
Monocytes Absolute: 0.6 10*3/uL (ref 0.1–1.0)
Monocytes Relative: 10 %
Neutro Abs: 4 10*3/uL (ref 1.7–7.7)
Neutrophils Relative %: 74 %
Platelets: 156 10*3/uL (ref 150–400)
RBC: 3.2 MIL/uL — ABNORMAL LOW (ref 3.87–5.11)
RDW: 14.2 % (ref 11.5–15.5)
WBC: 5.5 10*3/uL (ref 4.0–10.5)
nRBC: 0 % (ref 0.0–0.2)

## 2021-05-14 LAB — URINALYSIS, MICROSCOPIC (REFLEX): WBC, UA: >50 WBC/hpf (ref 0–5)

## 2021-05-14 LAB — BASIC METABOLIC PANEL
Anion gap: 8 (ref 5–15)
BUN: 13 mg/dL (ref 8–23)
CO2: 25 mmol/L (ref 22–32)
Calcium: 9.1 mg/dL (ref 8.9–10.3)
Chloride: 102 mmol/L (ref 98–111)
Creatinine, Ser: 1.05 mg/dL — ABNORMAL HIGH (ref 0.44–1.00)
GFR, Estimated: 49 mL/min — ABNORMAL LOW (ref >60)
Glucose, Bld: 155 mg/dL — ABNORMAL HIGH (ref 70–99)
Potassium: 3.5 mmol/L (ref 3.5–5.1)
Sodium: 135 mmol/L (ref 135–145)

## 2021-05-14 LAB — RESP PANEL BY RT-PCR (FLU A&B, COVID) ARPGX2
Influenza A by PCR: NEGATIVE
Influenza B by PCR: NEGATIVE
SARS Coronavirus 2 by RT PCR: NEGATIVE

## 2021-05-14 MED ORDER — SODIUM CHLORIDE 0.9 % IV SOLN
1.0000 g | INTRAVENOUS | Status: DC
  Administered 2021-05-15: 17:00:00 1 g via INTRAVENOUS
  Filled 2021-05-14: qty 10

## 2021-05-14 MED ORDER — SODIUM CHLORIDE 0.9 % IV SOLN
75.0000 mL/h | INTRAVENOUS | Status: AC
  Administered 2021-05-15: 01:00:00 75 mL/h via INTRAVENOUS

## 2021-05-14 MED ORDER — TIMOLOL MALEATE 0.5 % OP SOLN
1.0000 [drp] | Freq: Every day | OPHTHALMIC | Status: DC
  Filled 2021-05-14: qty 5

## 2021-05-14 MED ORDER — ACETAMINOPHEN 650 MG RE SUPP: 650.0000 mg | Freq: Four times a day (QID) | RECTAL | Status: DC | PRN

## 2021-05-14 MED ORDER — ACETAMINOPHEN 325 MG PO TABS
650.0000 mg | ORAL_TABLET | Freq: Four times a day (QID) | ORAL | Status: DC | PRN
  Administered 2021-05-17: 650 mg via ORAL
  Filled 2021-05-14: qty 2

## 2021-05-14 MED ORDER — IOHEXOL 300 MG/ML  SOLN
100.0000 mL | Freq: Once | INTRAMUSCULAR | Status: AC | PRN
  Administered 2021-05-14: 15:00:00 100 mL via INTRAVENOUS

## 2021-05-14 MED ORDER — HYDROCODONE-ACETAMINOPHEN 5-325 MG PO TABS
1.0000 | ORAL_TABLET | ORAL | Status: DC | PRN
  Administered 2021-05-15 – 2021-05-17 (×5): 1 via ORAL
  Filled 2021-05-14 (×5): qty 1

## 2021-05-14 MED ORDER — OXYBUTYNIN CHLORIDE ER 5 MG PO TB24
10.0000 mg | ORAL_TABLET | Freq: Every day | ORAL | Status: DC
  Administered 2021-05-15 – 2021-05-17 (×4): 10 mg via ORAL
  Filled 2021-05-14 (×4): qty 2

## 2021-05-14 MED ORDER — ONDANSETRON HCL 4 MG PO TABS: 4.0000 mg | ORAL_TABLET | Freq: Four times a day (QID) | ORAL | Status: DC | PRN

## 2021-05-14 MED ORDER — ONDANSETRON HCL 4 MG/2ML IJ SOLN: 4.0000 mg | Freq: Four times a day (QID) | INTRAMUSCULAR | Status: DC | PRN

## 2021-05-14 MED ORDER — ATORVASTATIN CALCIUM 40 MG PO TABS
40.0000 mg | ORAL_TABLET | Freq: Every day | ORAL | Status: DC
  Administered 2021-05-15 – 2021-05-18 (×4): 40 mg via ORAL
  Filled 2021-05-14 (×4): qty 1

## 2021-05-14 MED ORDER — SODIUM CHLORIDE 0.9 % IV SOLN
1.0000 g | Freq: Once | INTRAVENOUS | Status: AC
  Administered 2021-05-14: 18:00:00 1 g via INTRAVENOUS
  Filled 2021-05-14: qty 10

## 2021-05-14 NOTE — Assessment & Plan Note (Signed)
Asymptomatic, will need to follow up as out pt

## 2021-05-14 NOTE — Assessment & Plan Note (Signed)
Pt Ot eval, would need to assess if should be of xarelto given falls and now psoas bleed

## 2021-05-14 NOTE — Assessment & Plan Note (Signed)
-  chronic avoid nephrotoxic medications such as NSAIDs, Vanco Zosyn combo,  avoid hypotension, continue to follow renal function  

## 2021-05-14 NOTE — Assessment & Plan Note (Signed)
Con't lipitor  

## 2021-05-14 NOTE — Assessment & Plan Note (Signed)
Resume home coreg when BP allows and hold xarelto due to psoas hematoma

## 2021-05-14 NOTE — ED Notes (Signed)
Report given to [facility] nurse

## 2021-05-14 NOTE — ED Triage Notes (Signed)
Pt arrives via EMS from home. Pt was getting into her chair lift on Sunday and accidentally fell into the floor. Pt injured her lower back. She is on xarelto but denies hitting her head and no loc.

## 2021-05-14 NOTE — Care Plan (Signed)
Was called by ED physician, Dr. Thamas Jaegers, about patient is a 87 year old female history of TIAs, A. fib chronically on anticoagulation presenting to the ED after unwitnessed fall 4 days prior to admission, patient also presented with worsening weakness and declining over the past month.  Work-up done in the ED noted patient with a UTI, psoas hematoma, generalized weakness. Hospitalist called for admission.  Patient accepted under observation on a telemetry floor.  Asked ED physician to add urine cultures to urinalysis already obtained.  Patient noted to have received a dose of IV Rocephin in the ED.  No charge.

## 2021-05-14 NOTE — Assessment & Plan Note (Signed)
Hold Xarelto for tonight Hold aspirin while has psoas hemorhage

## 2021-05-14 NOTE — Assessment & Plan Note (Signed)
Obtain anemia panel , follow serial CBC given psoas hematoma

## 2021-05-14 NOTE — Subjective & Objective (Signed)
Was trying to get into her chair lift and accidentally fell on the floor injuring her lower back patient is on Xarelto denies head injury or LOC this was 4 days ago. Son found her on the ground laying on her back.  She continued to complain of some low back pain since then.  Today she had another fall off her bed and landed on her chest.

## 2021-05-14 NOTE — Assessment & Plan Note (Signed)
Hold Xarelto.  Monitor serial CBC May benefit from IR consult to see if would be a candidate for evacuation.

## 2021-05-14 NOTE — Assessment & Plan Note (Signed)
-   treat with Rocephin         await results of urine culture and adjust antibiotic coverage as needed  

## 2021-05-14 NOTE — H&P (Signed)
Holly Nunez PIR:[id number] DOB: 1925/03/28 DOA: 05/14/2021     PCP: Mosie Lukes, MD   Outpatient Specialists:   CARDS:   Dr. Stanford Breed   Patient arrived to ER on 05/14/21 at 1338 Referred by Attending Eugenie Filler, MD   Patient coming from: home Lives  With family    Chief Complaint:   Chief Complaint  Patient presents with   Fall    HPI: Holly Nunez is a 87 y.o. female with medical history significant of TIA arthritis HLD HTN CKD  Diastolic CHF  Presented with fall x2 back pain Was trying to get into her chair lift and accidentally fell on the floor injuring her lower back patient is on Xarelto denies head injury or LOC this was 4 days ago. Son found her on the ground laying on her back.  She continued to complain of some low back pain since then.  Today she had another fall off her bed and landed on her chest.  Has   been vaccinated against COVID and boosted had     Initial COVID TEST  NEGATIVE   Lab Results  Component Value Date   [place] NEGATIVE 05/14/2021     Regarding pertinent Chronic problems:    Hyperlipidemia -  on statins Lipitor Lipid Panel     Component Value Date/Time   CHOL 133 03/19/2021 1351   TRIG 56.0 03/19/2021 1351   HDL 72.70 03/19/2021 1351   CHOLHDL 2 03/19/2021 1351   VLDL 11.2 03/19/2021 1351   LDLCALC 49 03/19/2021 1351   LDLCALC 59 03/04/2020 1123     HTN on Coreg Maxzide  chronic CHF diastolic- last [id number] grade 1 diastolic         Hx of TVA - on xarelto initialy used to be on Plavix and aspirin but recurrence    A. Fib -  - CHA2DS2 vas score  >3  current  on anticoagulation with  Xarelto,          -  Rate control:  Currently controlled with  Coreg             CKD stage IIIb- baseline Cr   CrCl cannot be calculated (Unknown ideal weight.).  Lab Results  Component Value Date   CREATININE 1.05 (H) 05/14/2021   CREATININE 1.44 (H) 03/19/2021   CREATININE 1.22 (H) 09/01/2020    Chronic  anemia - baseline hg Hemoglobin & Hematocrit  Recent Labs    09/01/20 1450 03/19/21 1351 05/14/21 1412  HGB 11.4* 11.4* 10.4*     While in ER: Clinical Course as of 05/14/21 2312  Thu May 14, 2021  1429 Chest x-ray interpreted by me as possible left base infection versus atelectasis.  Awaiting radiology reading. [MB]    Clinical Course User Index [MB] Hayden Rasmussen, MD   UA worrisome for possible UTI started on Rocephin   CT showed source hematoma Urine culture ordered Ordered  CT HEAD No acute intracranial hemorrhage or calvarial fracture.    CXR - Bibasilar atelectasis/infiltrate and small effusions.   CTabd/pelvis -  moderate size complex hematoma involving the left iliopsoas muscle extending from the iliac fossa into the proximal thigh region.   Bilateral pleural effusions are noted with adjacent subsegmental atelectasis.  2.1 x 1.2 cm bilobed cystic abnormality is seen in the pancreatic head. Most likely a pseudocyst. Indolent neoplasm, such as intraductal papillary mucinous tumor could look similar. Per consensus criteria, follow-up with preferably pre and post contrast abdominal MRI at 1  year is recommended.    Following Medications were ordered in ER: Medications  iohexol (OMNIPAQUE) 300 MG/ML solution 100 mL (100 mLs Intravenous Contrast Given 05/14/21 1517)  cefTRIAXone (ROCEPHIN) 1 g in sodium chloride 0.9 % 100 mL IVPB (0 g Intravenous Stopped 05/14/21 1917)       ED Triage Vitals  Enc Vitals Group     BP 05/14/21 1345 (!) 157/68     Pulse Rate 05/14/21 1345 76     Resp 05/14/21 1345 17     Temp 05/14/21 1351 98.4 F (36.9 C)     Temp Source 05/14/21 1351 Oral     SpO2 05/14/21 1345 97 %     Weight --      Height --      Head Circumference --      Peak Flow --      Pain Score 05/14/21 1349 7     Pain Loc --      Pain Edu? --      Excl. in [place]? --   TMAX(24)@     _________________________________________ Significant initial   Findings: Abnormal Labs Reviewed  BASIC METABOLIC PANEL - Abnormal; Notable for the following components:      Result Value   Glucose, Bld 155 (*)    Creatinine, Ser 1.05 (*)    GFR, Estimated 49 (*)    All other components within normal limits  CBC WITH DIFFERENTIAL/PLATELET - Abnormal; Notable for the following components:   RBC 3.20 (*)    Hemoglobin 10.4 (*)    HCT 31.2 (*)    All other components within normal limits  URINALYSIS, ROUTINE W REFLEX MICROSCOPIC - Abnormal; Notable for the following components:   APPearance CLOUDY (*)    Hgb urine dipstick SMALL (*)    Protein, ur 100 (*)    Nitrite POSITIVE (*)    Leukocytes,Ua SMALL (*)    All other components within normal limits  URINALYSIS, MICROSCOPIC (REFLEX) - Abnormal; Notable for the following components:   Bacteria, UA MANY (*)    Non Squamous Epithelial PRESENT (*)    All other components within normal limits       ECG: Ordered Personally reviewed by me showing: HR : 72 Rhythm:  NSR,  w PVC  nonspecific changes,   QTC 462    The recent clinical data is shown below. Vitals:   05/14/21 1830 05/14/21 1900 05/14/21 2000 05/14/21 2155  BP: (!) 156/62 (!) 176/67 (!) 162/75 (!) 161/77  Pulse: (!) 59 70 (!) 50 64  Resp: 16 19 18 18   Temp:    98 F (36.7 C)  TempSrc:    Oral  SpO2: 99% 97% 99% 99%     WBC     Component Value Date/Time   WBC 5.5 05/14/2021 1412   LYMPHSABS 0.8 05/14/2021 1412   MONOABS 0.6 05/14/2021 1412   EOSABS 0.0 05/14/2021 1412   BASOSABS 0.0 05/14/2021 1412      Lactic Acid, Venous No results found for: LATICACIDVEN      UA   evidence of UTI      Urine analysis:    Component Value Date/Time   COLORURINE YELLOW 05/14/2021 1550   APPEARANCEUR CLOUDY (A) 05/14/2021 1550   LABSPEC 1.020 05/14/2021 1550   PHURINE 6.5 05/14/2021 1550   GLUCOSEU NEGATIVE 05/14/2021 1550   HGBUR SMALL (A) 05/14/2021 1550   BILIRUBINUR NEGATIVE 05/14/2021 [place] 05/14/2021  1550   PROTEINUR 100 (A) 05/14/2021 1550  NITRITE POSITIVE (A) 05/14/2021 1550   LEUKOCYTESUR SMALL (A) 05/14/2021 1550    Results for orders placed or performed during the hospital encounter of 05/14/21  Resp Panel by RT-PCR (Flu A&B, Covid) Nasopharyngeal Swab     Status: None   Collection Time: 05/14/21  2:30 PM   Specimen: Nasopharyngeal Swab; Nasopharyngeal(NP) swabs in vial transport medium  Result Value Ref Range Status   SARS Coronavirus 2 by RT PCR NEGATIVE NEGATIVE Final         Influenza A by PCR NEGATIVE NEGATIVE Final   Influenza B by PCR NEGATIVE NEGATIVE Final           _______________________________________________ Hospitalist was called for admission for psoas hematoma  The following Work up has been ordered so far:  Orders Placed This Encounter  Procedures   Resp Panel by RT-PCR (Flu A&B, Covid) Nasopharyngeal Swab   Urine Culture   DG Chest Port 1 View   CT Head Wo Contrast   CT Abdomen Pelvis W Contrast   CT L-SPINE NO CHARGE   Basic metabolic panel   CBC with Differential   Urinalysis, Routine w reflex microscopic   Urinalysis, Microscopic (reflex)   Cardiac monitoring   Consult to hospitalist   Place in observation (patient's expected length of stay will be less than 2 midnights)     OTHER Significant initial  Findings:  labs showing:    Recent Labs  Lab 05/14/21 1412 05/15/21 0028  NA 135 134*  K 3.5 3.5  CO2 25 25  GLUCOSE 155* 109*  BUN 13 12  CREATININE 1.05* 0.96  CALCIUM 9.1 8.5*  MG  --  1.7  PHOS  --  2.8    Cr   stable,    Lab Results  Component Value Date   CREATININE 1.05 (H) 05/14/2021   CREATININE 1.44 (H) 03/19/2021   CREATININE 1.22 (H) 09/01/2020    Recent Labs  Lab 05/15/21 0028  AST 37  ALT 35  ALKPHOS 53  BILITOT 1.1  PROT 5.9*  ALBUMIN 3.0*   Lab Results  Component Value Date   CALCIUM 9.1 05/14/2021          Plt: Lab Results  Component Value Date   PLT 156 05/14/2021         Recent  Labs  Lab 05/14/21 1412 05/15/21 0028  WBC 5.5 5.6  NEUTROABS 4.0  --   HGB 10.4* 9.8*  HCT 31.2* 29.4*  MCV 97.5 97.7  PLT 156 170    HG/HCT  stable,       Component Value Date/Time   HGB 10.4 (L) 05/14/2021 1412   HCT 31.2 (L) 05/14/2021 1412   MCV 97.5 05/14/2021 1412         Cardiac Panel (last 3 results) Recent Labs    05/15/21 0028  CKTOTAL 196    .car BNP (last 3 results) No results for input(s): BNP in the last 8760 hours.    DM  labs:  HbA1C: Recent Labs    09/01/20 1450 03/19/21 1351  HGBA1C 5.6 5.9       CBG (last 3)  No results for input(s): GLUCAP in the last 72 hours.        Cultures:    Component Value Date/Time   SDES  02/06/2019 1758    URINE, RANDOM Performed at [facility], [address] Madelaine Bhat [place], [place] [address]    [facility]  02/06/2019 1758    NONE Performed at [facility]  [address] [place] [place], [place] [address]    CULT >=100,000 COLONIES/mL ESCHERICHIA COLI (A) 02/06/2019 1758   REPTSTATUS 02/08/2019 FINAL 02/06/2019 1758     Radiological Exams on Admission: CT Head Wo Contrast  Result Date: 05/14/2021 CLINICAL DATA:  Fall, trauma EXAM: CT HEAD WITHOUT CONTRAST TECHNIQUE: Contiguous axial images were obtained from the base of the skull through the vertex without intravenous contrast. COMPARISON:  Head CT 02/05/2021 FINDINGS: Image quality is degraded by motion artifact. Brain: There is no evidence of acute intracranial hemorrhage, extra-axial fluid collection, or acute infarct. There is mild global parenchymal volume loss with prominence of the ventricular system and extra-axial CSF spaces. Patchy hypodensity in the subcortical and periventricular white matter likely reflects sequela of chronic white matter microangiopathy. There is no mass lesion.  There is no midline shift. Vascular: There is calcification of the bilateral cavernous ICAs and vertebral arteries. Skull: Normal. Negative for  fracture or focal lesion. Sinuses/Orbits: The paranasal sinuses are clear. Bilateral lens implants are in place. The globes and orbits are otherwise unremarkable. Other: None. IMPRESSION: No acute intracranial hemorrhage or calvarial fracture. Electronically Signed   By: Valetta Mole M.D.   On: 05/14/2021 15:50   CT Abdomen Pelvis W Contrast  Result Date: 05/14/2021 CLINICAL DATA:  Fall several days ago. EXAM: CT ABDOMEN AND PELVIS WITH CONTRAST TECHNIQUE: Multidetector CT imaging of the abdomen and pelvis was performed using the standard protocol following bolus administration of intravenous contrast. CONTRAST:  146mL OMNIPAQUE IOHEXOL 300 MG/ML  SOLN COMPARISON:  None. FINDINGS: Lower chest: Bilateral pleural effusions are noted with adjacent subsegmental atelectasis. Hepatobiliary: No focal liver abnormality is seen. No gallstones, gallbladder wall thickening, or biliary dilatation. Pancreas: 2.1 x 1.2 cm bilobed cystic abnormality is noted in pancreatic head. No significant ductal dilatation is noted. No inflammation is noted. Spleen: Normal in size without focal abnormality. Adrenals/Urinary Tract: Adrenal glands appear normal. 7.9 cm right renal cyst is noted. No hydronephrosis or renal obstruction is noted. Urinary bladder is unremarkable. Stomach/Bowel: The stomach appears normal. There is no evidence of bowel obstruction or inflammation. Vascular/Lymphatic: Aortic atherosclerosis. No enlarged abdominal or pelvic lymph nodes. Reproductive: Status post hysterectomy. No adnexal masses. Other: There is heterogeneous material seen in the left iliac fossa which extends into the left hip region concerning for hematoma along the course of the left iliopsoas muscle. Musculoskeletal: No acute or significant osseous findings. IMPRESSION: There appears to be a moderate size complex hematoma involving the left iliopsoas muscle extending from the iliac fossa into the proximal thigh region. Bilateral pleural  effusions are noted with adjacent subsegmental atelectasis. 2.1 x 1.2 cm bilobed cystic abnormality is seen in the pancreatic head. Most likely a pseudocyst. Indolent neoplasm, such as intraductal papillary mucinous tumor could look similar. Per consensus criteria, follow-up with preferably pre and post contrast abdominal MRI at 1 year is recommended. This recommendation follows ACR consensus guidelines: Managing Incidental Findings on Abdominal CT: White Paper of the ACR Incidental Findings Committee. J Am Coll Radiol 2010;7:754-773. Aortic Atherosclerosis (ICD10-I70.0). Electronically Signed   By: Marijo Conception M.D.   On: 05/14/2021 16:01   CT L-SPINE NO CHARGE  Result Date: 05/14/2021 CLINICAL DATA:  Fall EXAM: CT LUMBAR SPINE WITH CONTRAST TECHNIQUE: Multiplanar CT images of the lumbar spine were reconstructed from contemporary CT of the abdomen, and Pelvis COMPARISON:  None. FINDINGS: Segmentation: Standard; the lowest formed disc space is designated L5-S1. Alignment: Normal.  There is no evidence of traumatic malalignment. Vertebrae:  The bones are diffusely demineralized. Vertebral body heights are preserved. There is no evidence of acute fracture. Paraspinal and other soft tissues: There is asymmetric thickening of the left iliacus muscle suspicious for intramuscular hematoma. Bilateral pleural effusions are noted. There is small volume fluid in the presacral space. The abdominal and pelvic viscera are assessed on the separately dictated CT abdomen/pelvis. Disc levels: The disc spaces are overall preserved. There is multilevel facet arthropathy most advanced at L4-L5 and L5-S1. There is no definite significant disc herniation. Facet arthropathy and ligamentum flavum thickening contributes to moderate spinal canal stenosis at L3-L4 and mild-to-moderate spinal canal stenosis at L4-L5. There is moderate left neural foraminal stenosis at L3-L4. IMPRESSION: 1. No acute fracture or traumatic malalignment of  the lumbar spine. 2. Intramuscular hematoma in the left iliacus muscle. 3. Relatively mild multilevel degenerative changes throughout the lumbar spine as above. 4. Bilateral pleural effusions. 5. Please also refer to the separately dictated CT abdomen/pelvis. Electronically Signed   By: Valetta Mole M.D.   On: 05/14/2021 16:01   DG Chest Port 1 View  Result Date: 05/14/2021 CLINICAL DATA:  Weakness.  Recent fall. EXAM: PORTABLE CHEST 1 VIEW COMPARISON:  02/06/2019 FINDINGS: Heart size upper normal.  Normal vascularity Bibasilar atelectasis/infiltrate. Small bilateral effusions. Negative for pulmonary edema. IMPRESSION: Bibasilar atelectasis/infiltrate and small effusions. Electronically Signed   By: Franchot Gallo M.D.   On: 05/14/2021 14:26   _______________________________________________________________________________________________________ Latest  Blood pressure (!) 161/77, pulse 64, temperature 98 F (36.7 C), temperature source Oral, resp. rate 18, SpO2 99 %.   Vitals  labs and radiology finding personally reviewed  Review of Systems:    Pertinent positives include: falls  back pain.  fatigue, Constitutional:  No weight loss, night sweats, Fevers, chills,  weight loss  HEENT:  No headaches, Difficulty swallowing,Tooth/dental problems,Sore throat,  No sneezing, itching, ear ache, nasal congestion, post nasal drip,  Cardio-vascular:  No chest pain, Orthopnea, PND, anasarca, dizziness, palpitations.no Bilateral lower extremity swelling  GI:  No heartburn, indigestion, abdominal pain, nausea, vomiting, diarrhea, change in bowel habits, loss of appetite, melena, blood in stool, hematemesis Resp:  no shortness of breath at rest. No dyspnea on exertion, No excess mucus, no productive cough, No non-productive cough, No coughing up of blood.No change in color of mucus.No wheezing. Skin:  no rash or lesions. No jaundice GU:  no dysuria, change in color of urine, no urgency or frequency. No  straining to urinate.  No flank pain.  Musculoskeletal:  No joint pain or no joint swelling. No decreased range of motion. No Psych:  No change in mood or affect. No depression or anxiety. No memory loss.  Neuro: no localizing neurological complaints, no tingling, no weakness, no double vision, no gait abnormality, no slurred speech, no confusion  All systems reviewed and apart from [place] all are negative _______________________________________________________________________________________________ Past Medical History:   Past Medical History:  Diagnosis Date   Arthritis    Estrogen deficiency 05/27/2016   Great toe pain, left 12/02/2016   H/O measles    H/O mumps    History of chicken pox    Hyperglycemia 05/27/2016   Hyperlipidemia    Hypertension    Patent foramen ovale    Preventative health care 02/26/2015   Renal insufficiency    Thyroid nodule    Biopsy-negative   TIA (transient ischemic attack)    Urinary incontinence       Past Surgical History:  Procedure Laterality Date   ABDOMINAL HYSTERECTOMY  fibroid   APPENDECTOMY     EYE SURGERY Bilateral    CATARACT REMOVAL   JOINT REPLACEMENT     REPLACEMENT TOTAL KNEE Right 2007   THYROID LOBECTOMY Left 04/25/2015   THYROID LOBECTOMY Left 04/25/2015   Procedure: LEFT THYROID LOBECTOMY;  Surgeon: Armandina Gemma, MD;  Location: [facility];  Service: General;  Laterality: Left;   TRIGGER FINGER RELEASE  2007    Social History:  Ambulatory  walker        reports that she has never smoked. She has never used smokeless tobacco. She reports that she does not drink alcohol and does not use drugs.     Family History:   Family History  Problem Relation Age of Onset   Heart disease Mother    Hypertension Mother    Kidney disease Sister    Mental illness Sister        suicide, depresion   Gout Brother    Hypertension Son    Cancer Maternal Grandmother    Cancer Sister    Bone cancer Sister    Cancer Sister     Cancer Sister        kidney   Cancer Daughter    Pancreatic cancer Daughter    Thyroid disease Daughter    Thyroid disease Daughter        thyroid nodules   Hypertension Daughter    Irritable bowel syndrome Daughter    Cancer Daughter        thyroid   Cancer Brother    Prostate cancer Brother    ______________________________________________________________________________________________ Allergies: Allergies  Allergen Reactions   Alphagan [Brimonidine] Other (See Comments)    Secondary infection and vision changes     Prior to Admission medications   Medication Sig Start Date End Date Taking? Authorizing Provider  atorvastatin (LIPITOR) 40 MG tablet TAKE 1 TABLET(40 MG) BY MOUTH DAILY 05/06/21   Mosie Lukes, MD  acetaminophen (TYLENOL) 650 MG CR tablet Take 1,300 mg by mouth every 8 (eight) hours as needed for pain.    [provider]  carvedilol (COREG) 3.125 MG tablet TAKE 2 TABLETS(6.25 MG) BY MOUTH TWICE DAILY 01/22/21   Mosie Lukes, MD  famotidine (PEPCID) 40 MG tablet Take 1 tablet (40 mg total) by mouth at bedtime as needed for heartburn or indigestion. 02/26/20   Mosie Lukes, MD  hyoscyamine (ANASPAZ) 0.125 MG TBDP disintergrating tablet Place 1 tablet (0.125 mg total) under the tongue every 4 (four) hours as needed. 09/01/20   Mosie Lukes, MD  nitroGLYCERIN (NITROSTAT) 0.4 MG SL tablet Place 1 tablet (0.4 mg total) under the tongue every 5 (five) minutes as needed for chest pain. 10/01/20 12/30/20  Lelon Perla, MD  oxybutynin (DITROPAN-XL) 10 MG 24 hr tablet TAKE 1 TABLET(10 MG) BY MOUTH AT BEDTIME 10/24/20   Mosie Lukes, MD  timolol (TIMOPTIC) 0.5 % ophthalmic solution Place 1 drop into both eyes at bedtime. 06/14/17   [provider]  triamterene-hydrochlorothiazide (MAXZIDE-25) 37.5-25 MG tablet TAKE 1 TABLET BY MOUTH DAILY 01/22/21   Mosie Lukes, MD  XARELTO 15 MG TABS tablet TAKE 1 TABLET(15 MG) BY MOUTH DAILY WITH SUPPER 01/22/21    Lelon Perla, MD    ___________________________________________________________________________________________________ Physical Exam: Vitals with BMI 05/14/2021 05/14/2021 05/14/2021  Height - - -  Weight - - -  BMI - - -  Systolic 027 741 287  Diastolic 77 75 67  Pulse 64 50 70  1. General:  in No  Acute distress   well   -appearing 2. Psychological: Alert and   Oriented 3. Head/ENT:  Dry Mucous Membranes                          Head Non traumatic, neck supple                            Poor Dentition 4. SKIN: decreased Skin turgor,  Skin clean Dry and intact no rash 5. Heart: Regular rate and rhythm no  Murmur, no Rub or gallop 6. Lungs:   no wheezes or crackles   7. Abdomen: Soft,  non-tender, Non distended bowel sounds present 8. Lower extremities: no clubbing, cyanosis, no  edema 9. Neurologically Grossly intact, moving all 4 extremities equally  10. MSK: Normal range of motion    Chart has been reviewed  ______________________________________________________________________________________________  Assessment/Plan  87 y.o. female with medical history significant of TIA arthritis HLD HTN CKD  Diastolic CHF   Admitted for psoas hematoma and UTI  Present on Admission:  Fall  HTN (hypertension)  Hyperlipidemia  Anemia  Debility  Urinary tract infection  Hematoma of psoas region due to anticoagulant therapy  CKD (chronic kidney disease), stage III (HCC)  Paroxysmal A-fib (HCC)  Pancreatic pseudocyst/cyst  Abnormal ECG     Fall Pt Ot eval, would need to assess if should be of xarelto given falls and now psoas bleed  HTN (hypertension) Hold HCTZ given gentle IV fluids Hold coreg for bradycardia resume if HR improves  Hyperlipidemia Cont lipitor  Anemia Obtain anemia panel , follow serial CBC given psoas hematoma  Debility Pt ot eval  History of TIA (transient ischemic attack) Hold Xarelto for tonight Hold aspirin while has psoas  hemorhage  Urinary tract infection  - treat with Rocephin        await results of urine culture and adjust antibiotic coverage as needed   Hematoma of psoas region due to anticoagulant therapy Hold Xarelto.  Monitor serial CBC May benefit from IR consult to see if would be a candidate for evacuation.  CKD (chronic kidney disease), stage III (HCC)  -chronic avoid nephrotoxic medications such as NSAIDs, Vanco Zosyn combo,  avoid hypotension, continue to follow renal function   Paroxysmal A-fib ([place]) Resume home coreg when BP allows and hold xarelto due to psoas hematoma  Pancreatic pseudocyst/cyst Asymptomatic, will need to follow up as out pt  Abnormal ECG Order echo, no CP     Other plan as per orders.  DVT prophylaxis:  SCD      Code Status:    Code Status: Prior FULL CODE   as per patient   I had personally discussed CODE STATUS with patient     Family Communication:   Family not at  Bedside    Disposition Plan:     To home once workup is complete and patient is stable   Following barriers for discharge:                            Electrolytes corrected                               Anemia stable  Pain controlled with PO medications                                                          Will need consultants to evaluate patient prior to discharge                       Would benefit from PT/OT eval prior to DC  Ordered                                        Consults called:  IR consult  Admission status:  ED Disposition     ED Disposition  Admit   Condition  --   [place]: [place] [[id number]]  Level of Care: Telemetry [5]  Admit to tele based on following criteria: Complex arrhythmia (Bradycardia/Tachycardia)  Interfacility transfer: Yes  May place patient in observation at [facility] or [place] if equivalent level of care is available:: Yes  Covid Evaluation: Confirmed COVID Negative   Diagnosis: Fall [[id number]]  Admitting Physician: Eugenie Filler [place]  Attending Physician: Eugenie Filler [[id number]]           Obs      Level of care     tele  For 12H      Lab Results  Component Value Date   [place] 05/14/2021     Precautions: admitted as   Covid Negative       Holly Nunez 05/15/2021, 1:49 AM    [facility]     after 2 AM please page floor coverage PA If 7AM-7PM, please contact the day team taking care of the patient using [email or web address]   Patient was evaluated in the context of the global COVID-19 pandemic, which necessitated consideration that the patient might be at risk for infection with the SARS-CoV-2 virus that causes COVID-19. Institutional protocols and algorithms that pertain to the evaluation of patients at risk for COVID-19 are in a state of rapid change based on information released by regulatory bodies including the CDC and federal and state organizations. These policies and algorithms were followed during the patient's care.

## 2021-05-14 NOTE — Assessment & Plan Note (Signed)
Pt ot eval  

## 2021-05-14 NOTE — ED Notes (Signed)
Attempted to call report to [facility] x 2 sent to voicemail

## 2021-05-14 NOTE — ED Provider Notes (Signed)
[facility] DEPARTMENT Provider Note   CSN: [id number] Arrival date & time: 05/14/21  1338     History Chief Complaint  Patient presents with   fall back pain   Fall    Holly Nunez is a 87 y.o. female.  She has a history of TIAs and is on Xarelto.  She had an unwitnessed fall 4 days ago at home.  She lives with her son who is her caregiver.  He said he found her on the ground laying on her back.  No loss of consciousness.  Patient states she missed her chairlift.  She has been slower moving around since then complaining of some lower back pain.  Today she possibly has fallen again as he found her off of the bed laying on a chest.  She denies any headache neck pain chest pain abdominal pain.  Denies any numbness or weakness.  The history is provided by the patient and a relative.  Fall This is a recurrent problem. Episode onset: 4 days. The problem has not changed since onset.Pertinent negatives include no chest pain, no abdominal pain, no headaches and no shortness of breath. The symptoms are aggravated by walking, bending and twisting. Nothing relieves the symptoms. She has tried rest for the symptoms. The treatment provided no relief.      Past Medical History:  Diagnosis Date   Arthritis    Estrogen deficiency 05/27/2016   Great toe pain, left 12/02/2016   H/O measles    H/O mumps    History of chicken pox    Hyperglycemia 05/27/2016   Hyperlipidemia    Hypertension    Patent foramen ovale    Preventative health care 02/26/2015   Renal insufficiency    Thyroid nodule    Biopsy-negative   TIA (transient ischemic attack)    Urinary incontinence     Patient Active Problem List   Diagnosis Date Noted   History of TIA (transient ischemic attack) 04/16/2020   Anticoagulation adequate 04/16/2020   Dyspepsia 08/21/2019   Urinary tract infection 02/25/2019   Trauma 09/23/2017   Debility 06/21/2017   Great toe pain, left 12/02/2016   Vitamin D  deficiency 12/02/2016   Hyperglycemia 05/27/2016   Estrogen deficiency 05/27/2016   Overactive bladder 12/07/2015   Allergy 12/07/2015   Multinodular goiter (nontoxic) 04/25/2015   Preventative health care 02/26/2015   History of chicken pox    Renal insufficiency 03/07/2014   HTN (hypertension) 02/19/2014   Hyperlipidemia 02/19/2014   DJD (degenerative joint disease) 02/19/2014   S/P hysterectomy 02/19/2014   Anemia 02/19/2014   Urinary incontinence 02/19/2014    Past Surgical History:  Procedure Laterality Date   ABDOMINAL HYSTERECTOMY     fibroid   APPENDECTOMY     EYE SURGERY Bilateral    CATARACT REMOVAL   JOINT REPLACEMENT     REPLACEMENT TOTAL KNEE Right 2007   THYROID LOBECTOMY Left 04/25/2015   THYROID LOBECTOMY Left 04/25/2015   Procedure: LEFT THYROID LOBECTOMY;  Surgeon: Armandina Gemma, MD;  Location: [facility];  Service: General;  Laterality: Left;   TRIGGER FINGER RELEASE  2007     OB History   No obstetric history on file.     Family History  Problem Relation Age of Onset   Heart disease Mother    Hypertension Mother    Kidney disease Sister    Mental illness Sister        suicide, depresion   Gout Brother    Hypertension Son  Cancer Maternal Grandmother    Cancer Sister    Bone cancer Sister    Cancer Sister    Cancer Sister        kidney   Cancer Daughter    Pancreatic cancer Daughter    Thyroid disease Daughter    Thyroid disease Daughter        thyroid nodules   Hypertension Daughter    Irritable bowel syndrome Daughter    Cancer Daughter        thyroid   Cancer Brother    Prostate cancer Brother     Social History   Tobacco Use   Smoking status: Never   Smokeless tobacco: Never  Vaping Use   Vaping Use: Never used  Substance Use Topics   Alcohol use: No   Drug use: No    Home Medications Prior to Admission medications   Medication Sig Start Date End Date Taking? Authorizing Provider  atorvastatin (LIPITOR) 40 MG tablet TAKE  1 TABLET(40 MG) BY MOUTH DAILY 05/06/21   Mosie Lukes, MD  acetaminophen (TYLENOL) 650 MG CR tablet Take 1,300 mg by mouth every 8 (eight) hours as needed for pain.    [provider]  carvedilol (COREG) 3.125 MG tablet TAKE 2 TABLETS(6.25 MG) BY MOUTH TWICE DAILY 01/22/21   Mosie Lukes, MD  famotidine (PEPCID) 40 MG tablet Take 1 tablet (40 mg total) by mouth at bedtime as needed for heartburn or indigestion. 02/26/20   Mosie Lukes, MD  hyoscyamine (ANASPAZ) 0.125 MG TBDP disintergrating tablet Place 1 tablet (0.125 mg total) under the tongue every 4 (four) hours as needed. 09/01/20   Mosie Lukes, MD  nitroGLYCERIN (NITROSTAT) 0.4 MG SL tablet Place 1 tablet (0.4 mg total) under the tongue every 5 (five) minutes as needed for chest pain. 10/01/20 12/30/20  Lelon Perla, MD  oxybutynin (DITROPAN-XL) 10 MG 24 hr tablet TAKE 1 TABLET(10 MG) BY MOUTH AT BEDTIME 10/24/20   Mosie Lukes, MD  timolol (TIMOPTIC) 0.5 % ophthalmic solution Place 1 drop into both eyes at bedtime. 06/14/17   [provider]  triamterene-hydrochlorothiazide (MAXZIDE-25) 37.5-25 MG tablet TAKE 1 TABLET BY MOUTH DAILY 01/22/21   Mosie Lukes, MD  XARELTO 15 MG TABS tablet TAKE 1 TABLET(15 MG) BY MOUTH DAILY WITH SUPPER 01/22/21   Lelon Perla, MD    Allergies    Alphagan [brimonidine]  Review of Systems   Review of Systems  Constitutional:  Negative for fever.  HENT:  Negative for sore throat.   Eyes:  Negative for visual disturbance.  Respiratory:  Negative for shortness of breath.   Cardiovascular:  Negative for chest pain.  Gastrointestinal:  Negative for abdominal pain.  Genitourinary:  Negative for dysuria.  Musculoskeletal:  Positive for back pain.  Skin:  Negative for rash.  Neurological:  Negative for headaches.   Physical Exam Updated Vital Signs BP (!) 157/68    Pulse 76    Temp 98.4 F (36.9 C) (Oral)    Resp 17    SpO2 97%   Physical Exam Vitals and nursing note  reviewed.  Constitutional:      General: She is not in acute distress.    Appearance: Normal appearance. She is well-developed.  HENT:     Head: Normocephalic and atraumatic.  Eyes:     Conjunctiva/sclera: Conjunctivae normal.  Cardiovascular:     Rate and Rhythm: Normal rate and regular rhythm.     Heart sounds: No murmur heard.  Pulmonary:     Effort: Pulmonary effort is normal. No respiratory distress.     Breath sounds: Normal breath sounds.  Abdominal:     Palpations: Abdomen is soft.     Tenderness: There is no abdominal tenderness. There is no guarding or rebound.  Musculoskeletal:        General: Tenderness present. No swelling. Normal range of motion.     Cervical back: Neck supple.     Comments: She has tenderness in her lumbar spine and right and left paralumbar area.  Full range of motion of her upper and lower extremities without any pain or limitations.  She has some edema at her ankles bilaterally.  No cords appreciated  Skin:    General: Skin is warm and dry.     Capillary Refill: Capillary refill takes less than 2 seconds.  Neurological:     General: No focal deficit present.     Mental Status: She is alert.     Sensory: No sensory deficit.     Motor: No weakness.  Psychiatric:        Mood and Affect: Mood normal.    ED Results / Procedures / Treatments   Labs (all labs ordered are listed, but only abnormal results are displayed) Labs Reviewed  BASIC METABOLIC PANEL - Abnormal; Notable for the following components:      Result Value   Glucose, Bld 155 (*)    Creatinine, Ser 1.05 (*)    GFR, Estimated 49 (*)    All other components within normal limits  CBC WITH DIFFERENTIAL/PLATELET - Abnormal; Notable for the following components:   RBC 3.20 (*)    Hemoglobin 10.4 (*)    HCT 31.2 (*)    All other components within normal limits  URINALYSIS, ROUTINE W REFLEX MICROSCOPIC - Abnormal; Notable for the following components:   APPearance CLOUDY (*)    Hgb  urine dipstick SMALL (*)    Protein, ur 100 (*)    Nitrite POSITIVE (*)    Leukocytes,Ua SMALL (*)    All other components within normal limits  URINALYSIS, MICROSCOPIC (REFLEX) - Abnormal; Notable for the following components:   Bacteria, UA MANY (*)    Non Squamous Epithelial PRESENT (*)    All other components within normal limits  RESP PANEL BY RT-PCR (FLU A&B, COVID) [id number]  URINE CULTURE    EKG None  Radiology CT Head Wo Contrast  Result Date: 05/14/2021 CLINICAL DATA:  Fall, trauma EXAM: CT HEAD WITHOUT CONTRAST TECHNIQUE: Contiguous axial images were obtained from the base of the skull through the vertex without intravenous contrast. COMPARISON:  Head CT 02/05/2021 FINDINGS: Image quality is degraded by motion artifact. Brain: There is no evidence of acute intracranial hemorrhage, extra-axial fluid collection, or acute infarct. There is mild global parenchymal volume loss with prominence of the ventricular system and extra-axial CSF spaces. Patchy hypodensity in the subcortical and periventricular white matter likely reflects sequela of chronic white matter microangiopathy. There is no mass lesion.  There is no midline shift. Vascular: There is calcification of the bilateral cavernous ICAs and vertebral arteries. Skull: Normal. Negative for fracture or focal lesion. Sinuses/Orbits: The paranasal sinuses are clear. Bilateral lens implants are in place. The globes and orbits are otherwise unremarkable. Other: None. IMPRESSION: No acute intracranial hemorrhage or calvarial fracture. Electronically Signed   By: Valetta Mole M.D.   On: 05/14/2021 15:50   CT Abdomen Pelvis W Contrast  Result Date: 05/14/2021 CLINICAL DATA:  Fall several days  ago. EXAM: CT ABDOMEN AND PELVIS WITH CONTRAST TECHNIQUE: Multidetector CT imaging of the abdomen and pelvis was performed using the standard protocol following bolus administration of intravenous contrast. CONTRAST:  150mL OMNIPAQUE IOHEXOL 300 MG/ML   SOLN COMPARISON:  None. FINDINGS: Lower chest: Bilateral pleural effusions are noted with adjacent subsegmental atelectasis. Hepatobiliary: No focal liver abnormality is seen. No gallstones, gallbladder wall thickening, or biliary dilatation. Pancreas: 2.1 x 1.2 cm bilobed cystic abnormality is noted in pancreatic head. No significant ductal dilatation is noted. No inflammation is noted. Spleen: Normal in size without focal abnormality. Adrenals/Urinary Tract: Adrenal glands appear normal. 7.9 cm right renal cyst is noted. No hydronephrosis or renal obstruction is noted. Urinary bladder is unremarkable. Stomach/Bowel: The stomach appears normal. There is no evidence of bowel obstruction or inflammation. Vascular/Lymphatic: Aortic atherosclerosis. No enlarged abdominal or pelvic lymph nodes. Reproductive: Status post hysterectomy. No adnexal masses. Other: There is heterogeneous material seen in the left iliac fossa which extends into the left hip region concerning for hematoma along the course of the left iliopsoas muscle. Musculoskeletal: No acute or significant osseous findings. IMPRESSION: There appears to be a moderate size complex hematoma involving the left iliopsoas muscle extending from the iliac fossa into the proximal thigh region. Bilateral pleural effusions are noted with adjacent subsegmental atelectasis. 2.1 x 1.2 cm bilobed cystic abnormality is seen in the pancreatic head. Most likely a pseudocyst. Indolent neoplasm, such as intraductal papillary mucinous tumor could look similar. Per consensus criteria, follow-up with preferably pre and post contrast abdominal MRI at 1 year is recommended. This recommendation follows ACR consensus guidelines: Managing Incidental Findings on Abdominal CT: White Paper of the ACR Incidental Findings Committee. J Am Coll Radiol 2010;7:754-773. Aortic Atherosclerosis (ICD10-I70.0). Electronically Signed   By: Marijo Conception M.D.   On: 05/14/2021 16:01   CT L-SPINE NO  CHARGE  Result Date: 05/14/2021 CLINICAL DATA:  Fall EXAM: CT LUMBAR SPINE WITH CONTRAST TECHNIQUE: Multiplanar CT images of the lumbar spine were reconstructed from contemporary CT of the abdomen, and Pelvis COMPARISON:  None. FINDINGS: Segmentation: Standard; the lowest formed disc space is designated L5-S1. Alignment: Normal.  There is no evidence of traumatic malalignment. Vertebrae: The bones are diffusely demineralized. Vertebral body heights are preserved. There is no evidence of acute fracture. Paraspinal and other soft tissues: There is asymmetric thickening of the left iliacus muscle suspicious for intramuscular hematoma. Bilateral pleural effusions are noted. There is small volume fluid in the presacral space. The abdominal and pelvic viscera are assessed on the separately dictated CT abdomen/pelvis. Disc levels: The disc spaces are overall preserved. There is multilevel facet arthropathy most advanced at L4-L5 and L5-S1. There is no definite significant disc herniation. Facet arthropathy and ligamentum flavum thickening contributes to moderate spinal canal stenosis at L3-L4 and mild-to-moderate spinal canal stenosis at L4-L5. There is moderate left neural foraminal stenosis at L3-L4. IMPRESSION: 1. No acute fracture or traumatic malalignment of the lumbar spine. 2. Intramuscular hematoma in the left iliacus muscle. 3. Relatively mild multilevel degenerative changes throughout the lumbar spine as above. 4. Bilateral pleural effusions. 5. Please also refer to the separately dictated CT abdomen/pelvis. Electronically Signed   By: Valetta Mole M.D.   On: 05/14/2021 16:01   DG Chest Port 1 View  Result Date: 05/14/2021 CLINICAL DATA:  Weakness.  Recent fall. EXAM: PORTABLE CHEST 1 VIEW COMPARISON:  02/06/2019 FINDINGS: Heart size upper normal.  Normal vascularity Bibasilar atelectasis/infiltrate. Small bilateral effusions. Negative for pulmonary edema. IMPRESSION: Bibasilar atelectasis/infiltrate  and  small effusions. Electronically Signed   By: Franchot Gallo M.D.   On: 05/14/2021 14:26    Procedures Procedures   Medications Ordered in ED Medications  cefTRIAXone (ROCEPHIN) 1 g in sodium chloride 0.9 % 100 mL IVPB (1 g Intravenous New Bag/Given 05/14/21 1739)  iohexol (OMNIPAQUE) 300 MG/ML solution 100 mL (100 mLs Intravenous Contrast Given 05/14/21 1517)    ED Course  I have reviewed the triage vital signs and the nursing notes.  Pertinent labs & imaging results that were available during my care of the patient were reviewed by me and considered in my medical decision making (see chart for details).  Clinical Course as of 05/14/21 1743  Thu May 14, 2021  1429 Chest x-ray interpreted by me as possible left base infection versus atelectasis.  Awaiting radiology reading. [MB]    Clinical Course User Index [MB] Hayden Rasmussen, MD   MDM Rules/Calculators/A&P                         This patient complains of left-sided low back pain after fall; this involves an extensive number of treatment Options and is a complaint that carries with it a high risk of complications and Morbidity. The differential includes hematoma, contusion, compression fracture, retroperitoneal bleed, dehydration, UTI  I ordered, reviewed and interpreted labs, which included CBC with normal white count, hemoglobin slightly lower than priors, chemistries fairly normal other than elevated glucose, COVID and flu testing negative, urinalysis pending at time of signout I ordered imaging studies which included chest x-ray and head CT, CT abdomen and pelvis, CT lumbar spine and I independently    visualized and interpreted imaging which possible infiltrate or atelectasis.  CT imaging pending at time of signout Additional history obtained from patient's son who is her caregiver Previous records obtained and reviewed in epic was seen in October for a fall  After the interventions stated above, I reevaluated the patient  and found patient to be minimally symptomatic resting in bed.  Her care is signed out to Dr. Almyra Free to follow-up on results of CT imaging and urinalysis.  Potentially will need admission if his had a bleeding event.     Final Clinical Impression(s) / ED Diagnoses Final diagnoses:  Psoas hematoma, left, secondary to anticoagulant therapy, initial encounter  Fall in home, initial encounter  Lower urinary tract infectious disease    Rx / DC Orders ED Discharge Orders     None        Hayden Rasmussen, MD 05/14/21 1747

## 2021-05-14 NOTE — Assessment & Plan Note (Signed)
Hold HCTZ given gentle IV fluids Hold coreg for bradycardia resume if HR improves

## 2021-05-15 ENCOUNTER — Observation Stay (HOSPITAL_BASED_OUTPATIENT_CLINIC_OR_DEPARTMENT_OTHER): Payer: Medicare Other

## 2021-05-15 DIAGNOSIS — R9431 Abnormal electrocardiogram [ECG] [EKG]: Secondary | ICD-10-CM | POA: Diagnosis not present

## 2021-05-15 DIAGNOSIS — R109 Unspecified abdominal pain: Secondary | ICD-10-CM | POA: Diagnosis not present

## 2021-05-15 DIAGNOSIS — Z8673 Personal history of transient ischemic attack (TIA), and cerebral infarction without residual deficits: Secondary | ICD-10-CM | POA: Diagnosis not present

## 2021-05-15 DIAGNOSIS — T45515A Adverse effect of anticoagulants, initial encounter: Secondary | ICD-10-CM | POA: Diagnosis not present

## 2021-05-15 DIAGNOSIS — Z7901 Long term (current) use of anticoagulants: Secondary | ICD-10-CM | POA: Diagnosis not present

## 2021-05-15 DIAGNOSIS — M7981 Nontraumatic hematoma of soft tissue: Secondary | ICD-10-CM | POA: Diagnosis not present

## 2021-05-15 DIAGNOSIS — N1832 Chronic kidney disease, stage 3b: Secondary | ICD-10-CM | POA: Diagnosis not present

## 2021-05-15 DIAGNOSIS — W19XXXA Unspecified fall, initial encounter: Secondary | ICD-10-CM | POA: Diagnosis not present

## 2021-05-15 DIAGNOSIS — R519 Headache, unspecified: Secondary | ICD-10-CM | POA: Diagnosis not present

## 2021-05-15 DIAGNOSIS — N390 Urinary tract infection, site not specified: Secondary | ICD-10-CM | POA: Diagnosis not present

## 2021-05-15 DIAGNOSIS — S301XXA Contusion of abdominal wall, initial encounter: Secondary | ICD-10-CM | POA: Diagnosis not present

## 2021-05-15 DIAGNOSIS — I10 Essential (primary) hypertension: Secondary | ICD-10-CM | POA: Diagnosis not present

## 2021-05-15 DIAGNOSIS — Z79899 Other long term (current) drug therapy: Secondary | ICD-10-CM | POA: Diagnosis not present

## 2021-05-15 DIAGNOSIS — Z96651 Presence of right artificial knee joint: Secondary | ICD-10-CM | POA: Diagnosis not present

## 2021-05-15 DIAGNOSIS — Z20822 Contact with and (suspected) exposure to covid-19: Secondary | ICD-10-CM | POA: Diagnosis not present

## 2021-05-15 DIAGNOSIS — M545 Low back pain, unspecified: Secondary | ICD-10-CM | POA: Diagnosis not present

## 2021-05-15 LAB — COMPREHENSIVE METABOLIC PANEL
ALT: 35 U/L (ref 0–44)
ALT: 33 U/L (ref 0–44)
AST: 37 U/L (ref 15–41)
AST: 35 U/L (ref 15–41)
Albumin: 3 g/dL — ABNORMAL LOW (ref 3.5–5.0)
Albumin: 3.1 g/dL — ABNORMAL LOW (ref 3.5–5.0)
Alkaline Phosphatase: 53 U/L (ref 38–126)
Alkaline Phosphatase: 57 U/L (ref 38–126)
Anion gap: 5 (ref 5–15)
Anion gap: 6 (ref 5–15)
BUN: 12 mg/dL (ref 8–23)
BUN: 12 mg/dL (ref 8–23)
CO2: 25 mmol/L (ref 22–32)
CO2: 24 mmol/L (ref 22–32)
Calcium: 8.5 mg/dL — ABNORMAL LOW (ref 8.9–10.3)
Calcium: 8.8 mg/dL — ABNORMAL LOW (ref 8.9–10.3)
Chloride: 104 mmol/L (ref 98–111)
Chloride: 104 mmol/L (ref 98–111)
Creatinine, Ser: 0.96 mg/dL (ref 0.44–1.00)
Creatinine, Ser: 0.98 mg/dL (ref 0.44–1.00)
GFR, Estimated: 55 mL/min — ABNORMAL LOW (ref >60)
GFR, Estimated: 53 mL/min — ABNORMAL LOW (ref >60)
Glucose, Bld: 109 mg/dL — ABNORMAL HIGH (ref 70–99)
Glucose, Bld: 105 mg/dL — ABNORMAL HIGH (ref 70–99)
Potassium: 3.5 mmol/L (ref 3.5–5.1)
Potassium: 3.6 mmol/L (ref 3.5–5.1)
Sodium: 134 mmol/L — ABNORMAL LOW (ref 135–145)
Sodium: 134 mmol/L — ABNORMAL LOW (ref 135–145)
Total Bilirubin: 1.1 mg/dL (ref 0.3–1.2)
Total Bilirubin: 1.1 mg/dL (ref 0.3–1.2)
Total Protein: 5.9 g/dL — ABNORMAL LOW (ref 6.5–8.1)
Total Protein: 6 g/dL — ABNORMAL LOW (ref 6.5–8.1)

## 2021-05-15 LAB — CBC WITH DIFFERENTIAL/PLATELET
Abs Immature Granulocytes: 0.02 10*3/uL (ref 0.00–0.07)
Basophils Absolute: 0 10*3/uL (ref 0.0–0.1)
Basophils Relative: 1 %
Eosinophils Absolute: 0.1 10*3/uL (ref 0.0–0.5)
Eosinophils Relative: 1 %
HCT: 30.7 % — ABNORMAL LOW (ref 36.0–46.0)
Hemoglobin: 10.2 g/dL — ABNORMAL LOW (ref 12.0–15.0)
Immature Granulocytes: 0 %
Lymphocytes Relative: 24 %
Lymphs Abs: 1.4 10*3/uL (ref 0.7–4.0)
MCH: 32.4 pg (ref 26.0–34.0)
MCHC: 33.2 g/dL (ref 30.0–36.0)
MCV: 97.5 fL (ref 80.0–100.0)
Monocytes Absolute: 0.6 10*3/uL (ref 0.1–1.0)
Monocytes Relative: 11 %
Neutro Abs: 3.5 10*3/uL (ref 1.7–7.7)
Neutrophils Relative %: 63 %
Platelets: 157 10*3/uL (ref 150–400)
RBC: 3.15 MIL/uL — ABNORMAL LOW (ref 3.87–5.11)
RDW: 14.3 % (ref 11.5–15.5)
WBC: 5.6 10*3/uL (ref 4.0–10.5)
nRBC: 0 % (ref 0.0–0.2)

## 2021-05-15 LAB — LACTIC ACID, PLASMA: Lactic Acid, Venous: 1 mmol/L (ref 0.5–1.9)

## 2021-05-15 LAB — CBC
HCT: 29.4 % — ABNORMAL LOW (ref 36.0–46.0)
HCT: 31.3 % — ABNORMAL LOW (ref 36.0–46.0)
Hemoglobin: 9.8 g/dL — ABNORMAL LOW (ref 12.0–15.0)
Hemoglobin: 10.2 g/dL — ABNORMAL LOW (ref 12.0–15.0)
MCH: 32.6 pg (ref 26.0–34.0)
MCH: 32.6 pg (ref 26.0–34.0)
MCHC: 33.3 g/dL (ref 30.0–36.0)
MCHC: 32.6 g/dL (ref 30.0–36.0)
MCV: 97.7 fL (ref 80.0–100.0)
MCV: 100 fL (ref 80.0–100.0)
Platelets: 170 10*3/uL (ref 150–400)
Platelets: 165 10*3/uL (ref 150–400)
RBC: 3.01 MIL/uL — ABNORMAL LOW (ref 3.87–5.11)
RBC: 3.13 MIL/uL — ABNORMAL LOW (ref 3.87–5.11)
RDW: 14.3 % (ref 11.5–15.5)
RDW: 14.3 % (ref 11.5–15.5)
WBC: 5.6 10*3/uL (ref 4.0–10.5)
WBC: 5.3 10*3/uL (ref 4.0–10.5)
nRBC: 0 % (ref 0.0–0.2)
nRBC: 0 % (ref 0.0–0.2)

## 2021-05-15 LAB — ECHOCARDIOGRAM COMPLETE
AR max vel: 2.1 cm2
AV Peak grad: 7 mmHg
Ao pk vel: 1.32 m/s
Area-P 1/2: 3.17 cm2
Calc EF: 62.7 %
S' Lateral: 2.2 cm
Single Plane A2C EF: 63.1 %
Single Plane A4C EF: 62.8 %

## 2021-05-15 LAB — URINE CULTURE

## 2021-05-15 LAB — RETICULOCYTES
Immature Retic Fract: 24.8 % — ABNORMAL HIGH (ref 2.3–15.9)
RBC.: 3.01 MIL/uL — ABNORMAL LOW (ref 3.87–5.11)
Retic Count, Absolute: 77.4 10*3/uL (ref 19.0–186.0)
Retic Ct Pct: 2.6 % (ref 0.4–3.1)

## 2021-05-15 LAB — PHOSPHORUS
Phosphorus: 2.8 mg/dL (ref 2.5–4.6)
Phosphorus: 2.8 mg/dL (ref 2.5–4.6)

## 2021-05-15 LAB — TSH: TSH: 2.019 u[IU]/mL (ref 0.350–4.500)

## 2021-05-15 LAB — FERRITIN: Ferritin: 59 ng/mL (ref 11–307)

## 2021-05-15 LAB — IRON AND TIBC
Iron: 59 ug/dL (ref 28–170)
Saturation Ratios: 22 % (ref 10.4–31.8)
TIBC: 270 ug/dL (ref 250–450)
UIBC: 211 ug/dL

## 2021-05-15 LAB — MAGNESIUM
Magnesium: 1.7 mg/dL (ref 1.7–2.4)
Magnesium: 1.8 mg/dL (ref 1.7–2.4)

## 2021-05-15 LAB — FOLATE: Folate: 19.2 ng/mL (ref >5.9)

## 2021-05-15 LAB — VITAMIN B12: Vitamin B-12: 441 pg/mL (ref 180–914)

## 2021-05-15 LAB — CK: Total CK: 196 U/L (ref 38–234)

## 2021-05-15 MED ORDER — ASPIRIN EC 81 MG PO TBEC: 81.0000 mg | DELAYED_RELEASE_TABLET | Freq: Every day | ORAL | 2 refills | Status: AC

## 2021-05-15 MED ORDER — HYDROCODONE-ACETAMINOPHEN 5-325 MG PO TABS: 1.0000 | ORAL_TABLET | ORAL | 0 refills | Status: AC | PRN

## 2021-05-15 MED ORDER — ACETAMINOPHEN ER 650 MG PO TBCR
1300.0000 mg | EXTENDED_RELEASE_TABLET | Freq: Four times a day (QID) | ORAL | Status: DC | PRN
Start: 2021-05-15 — End: 2023-08-23

## 2021-05-15 MED ORDER — TIMOLOL MALEATE 0.5 % OP SOLN
1.0000 [drp] | Freq: Every day | OPHTHALMIC | Status: DC
  Administered 2021-05-15 – 2021-05-18 (×4): 1 [drp] via OPHTHALMIC

## 2021-05-15 NOTE — Plan of Care (Signed)
  Problem: Coping: Goal: Level of anxiety will decrease Outcome: Progressing   Problem: Elimination: Goal: Will not experience complications related to urinary retention Outcome: Progressing   Problem: Pain Managment: Goal: General experience of comfort will improve Outcome: Progressing   

## 2021-05-15 NOTE — Evaluation (Signed)
Occupational Therapy Evaluation Patient Details Name: Holly Nunez MRN: [id number] DOB: [birth year] Today's Date: 05/15/2021   History of Present Illness 87 year old female who presents s/p 2 falls in 4 days at home, with subsequent back and LT hip pain. CT of pelvis revealed: "There appears to be a moderate size complex hematoma involving the left iliopsoas muscle extending from the iliac fossa into the  proximal thigh region.     Bilateral pleural effusions are noted with adjacent subsegmental  atelectasis."   Clinical Impression   Patient is currently requiring assistance with ADLs including total assist with toileting at bed level as pt was unable to fully stand from EOB, Total assist with LE dressing, maximum assist with bed level bathing, moderate assist with UE dressing, as well as Minimal assist with bed level grooming.  Current level of function may be below patient's typical baseline, however pt confused and no family present.  During this evaluation, patient was limited by generalized weakness, impaired activity tolerance, and LT hip pain as well as impaired cognition with impulsivity and anxiety, all of which has the potential to impact patient's safety and independence during functional mobility, as well as performance for ADLs.  Patient lives with her son, who is unable to provide 24/7 supervision and assistance and is injured and in a le brace per pt report.  Patient demonstrates fair rehab potential, and should benefit from continued skilled occupational therapy services while in acute care to maximize safety, independence and quality of life at home.  Continued occupational therapy services in a SNF setting prior to return home is recommended.  ?    Recommendations for follow up therapy are one component of a multi-disciplinary discharge planning process, led by the attending physician.  Recommendations may be updated based on patient status, additional functional criteria and  insurance authorization.   Follow Up Recommendations  Skilled nursing-short term rehab (<3 hours/day)    Assistance Recommended at Discharge Frequent or constant Supervision/Assistance  Functional Status Assessment  Patient has had a recent decline in their functional status and demonstrates the ability to make significant improvements in function in a reasonable and predictable amount of time.  Equipment Recommendations  BSC/3in1 (Will also defer to post-acute recommendations)    Recommendations for Other Services PT consult     Precautions / Restrictions Precautions Precautions: Fall Precaution Comments: Impulsive Restrictions Weight Bearing Restrictions: No      Mobility Bed Mobility Overal bed mobility: Needs Assistance Bed Mobility: Supine to Sit;Sit to Supine     Supine to sit: Mod assist;HOB elevated Sit to supine: Max assist   General bed mobility comments: Step by step cues with pt having difficulty due to apparent anxiety and impulsivity. Required hand over hand guidance and cues for calm in order to safely follow instructions.    Transfers Overall transfer level: Needs assistance   Transfers: Sit to/from Stand Sit to Stand: Max assist           General transfer comment: Unable to clear hips from bed.  Will need assist of 2 and pain premedication.      Balance Overall balance assessment: Needs assistance Sitting-balance support: Bilateral upper extremity supported;Feet supported Sitting balance-Leahy Scale: Fair Sitting balance - Comments: Impulsive.     Standing balance-Leahy Scale: Zero Standing balance comment: Unable to come to full stand.                           ADL either performed  or assessed with clinical judgement   ADL Overall ADL's : Needs assistance/impaired Eating/Feeding: Set up;Bed level;Cueing for sequencing   Grooming: Oral care;Wash/dry face;Minimal assistance;Bed level;Cueing for sequencing Grooming Details  (indicate cue type and reason): Min Assist due to pt expectorating on self rather than in kidney basin despite cues. Upper Body Bathing: Minimal assistance;Sitting;Cueing for safety;Cueing for sequencing   Lower Body Bathing: Maximal assistance;Bed level;Cueing for safety;Cueing for sequencing   Upper Body Dressing : Moderate assistance;Sitting;Cueing for sequencing   Lower Body Dressing: Total assistance;Bed level Lower Body Dressing Details (indicate cue type and reason): To don socks in supine and EOB to don shoes.   Toilet Transfer Details (indicate cue type and reason): Unable to successfully stand fully from EOB with 2 attempts. Pt unable to clear hips with Max As of 1. Increased LT hip pain and awaiting pain meds, so terminated attempts. Toileting- Clothing Manipulation and Hygiene: Bed level;Total assistance Toileting - Clothing Manipulation Details (indicate cue type and reason): Using pure wick. Pt unsure of continence.     Functional mobility during ADLs: +2 for physical assistance;Moderate assistance;Maximal assistance       Vision Baseline Vision/History: 1 Wears glasses Ability to See in Adequate Light: 1 Impaired       Perception Perception Perception: Not tested   Praxis Praxis Praxis: Impaired Praxis Impairment Details: Motor planning;Ideation    Pertinent Vitals/Pain Pain Assessment: 0-10 Pain Score: 7  Pain Location: LT hip Pain Descriptors / Indicators: Sore;Spasm Pain Intervention(s): Patient requesting pain meds-RN notified;Repositioned;Limited activity within patient's tolerance     Hand Dominance Left   Extremity/Trunk Assessment Upper Extremity Assessment Upper Extremity Assessment: Generalized weakness   Lower Extremity Assessment Lower Extremity Assessment: Defer to PT evaluation       Communication Communication Communication: HOH   Cognition Arousal/Alertness: Awake/alert Behavior During Therapy: Anxious;Restless;Impulsive Overall  Cognitive Status: No family/caregiver present to determine baseline cognitive functioning Area of Impairment: Memory;Safety/judgement;Orientation;Problem solving;Awareness;Following commands                 Orientation Level: Time   Memory: Decreased recall of precautions Following Commands: Follows one step commands with increased time Safety/Judgement: Decreased awareness of safety;Decreased awareness of deficits Awareness: Intellectual Problem Solving: Slow processing;Difficulty sequencing;Requires verbal cues;Requires tactile cues       General Comments       Exercises     Shoulder Instructions      Home Living Family/patient expects to be discharged to:: Skilled nursing facility Living Arrangements: Children Available Help at Discharge: Available PRN/intermittently (Son is caregiver but pt reports recent injury for son who is in a leg brace.) Type of Home: House       Home Layout: Other (Comment) (Three level with stair/chair lift.)     Bathroom Shower/Tub: Occupational psychologist: Handicapped height     Home Equipment: Shower seat - built in;Grab bars - toilet;Grab bars - tub/shower;Rollator (4 wheels)   Additional Comments: Pt not reliable historian and no family present. Pt unsure if she has a 2 wheeled RW, and thinks that a friend can give her a wheelchair. Pt may have a lift recliner, but unsure.      Prior Functioning/Environment Prior Level of Function : Needs assist;History of Falls (last six months);Patient poor historian/Family not available       Physical Assist : ADLs (physical)   ADLs (physical): IADLs;Bathing Mobility Comments: Pt reports use of Rollator at all times. Uses chair lift to go to upstairs bedroom. ADLs Comments: Assisted by  daughter with bathing and medication management. Son or daughter perform all IADLs. Pt unsure on continence. Pt does not drive. Housekeeper 1x/month.        OT Problem List: Decreased  strength;Pain;Increased edema;Decreased cognition;Decreased activity tolerance;Decreased safety awareness;Decreased range of motion;Impaired balance (sitting and/or standing);Decreased knowledge of use of DME or AE;Decreased knowledge of precautions      OT Treatment/Interventions: Self-care/ADL training;Therapeutic exercise;Therapeutic activities;Cognitive remediation/compensation;DME and/or AE instruction;Patient/family education;Balance training    OT Goals(Current goals can be found in the care plan section) Acute Rehab OT Goals Patient Stated Goal: "Please don't send me to a nursing home." OT Goal Formulation: With patient Time For Goal Achievement: 05/29/21 Potential to Achieve Goals: Fair ADL Goals Pt Will Perform Upper Body Dressing: with set-up;with supervision;sitting Pt Will Perform Lower Body Dressing: with min assist;sit to/from stand;sitting/lateral leans Pt Will Transfer to Toilet: with min guard assist;ambulating;bedside commode Pt Will Perform Toileting - Clothing Manipulation and hygiene: with min guard assist;with adaptive equipment;sitting/lateral leans;sit to/from stand Pt/caregiver will Perform Home Exercise Program: Increased strength;Increased ROM;Both right and left upper extremity;With minimal assist  OT Frequency: Min 2X/week   Barriers to D/C: Decreased caregiver support  Son is primary caregiver and currently injured.       Co-evaluation              AM-PAC OT "6 Clicks" Daily Activity     Outcome Measure Help from another person eating meals?: A Little Help from another person taking care of personal grooming?: A Little Help from another person toileting, which includes using toliet, bedpan, or urinal?: Total Help from another person bathing (including washing, rinsing, drying)?: A Lot Help from another person to put on and taking off regular upper body clothing?: A Lot Help from another person to put on and taking off regular lower body clothing?:  Total 6 Click Score: 12   End of Session Equipment Utilized During Treatment: Gait belt;Rolling walker (2 wheels) Nurse Communication: Mobility status;Other (comment) (Pain med request)  Activity Tolerance: Patient limited by pain Patient left: in bed;with call bell/phone within reach;with bed alarm set  OT Visit Diagnosis: Unsteadiness on feet (R26.81);Repeated falls (R29.6);History of falling (Z91.81);Muscle weakness (generalized) (M62.81);Other symptoms and signs involving cognitive function;Pain Pain - Right/Left: Left Pain - part of body: Hip                Time: 0823-0904 OT Time Calculation (min): 41 min Charges:  OT General Charges $OT Visit: 1 Visit OT Evaluation $OT Eval Moderate Complexity: 1 Mod OT Treatments $Self Care/Home Management : 8-22 mins $Therapeutic Activity: 8-22 mins  Anderson [place], [place] Office: [phone] 05/15/2021 Julien Girt 05/15/2021, 9:34 AM

## 2021-05-15 NOTE — NC FL2 (Signed)
[place] LEVEL OF CARE SCREENING TOOL     IDENTIFICATION  Patient Name: Holly Nunez Birthdate: 15-May-1925 Sex: female Admission Date (Current Location): 05/14/2021  [facility] and [place] Number:  Herbalist and Address:  [facility],  [place], [place]      Provider Number: [id number]  Attending Physician Name and Address:  Debbe Odea, MD  Relative Name and Phone Number:       Current Level of Care: Hospital Recommended Level of Care: [place] Prior Approval Number:    Date Approved/Denied:   PASRR Number: [id number]  Discharge Plan: SNF    Current Diagnoses: Patient Active Problem List   Diagnosis Date Noted   Abnormal ECG 05/15/2021   Fall 05/14/2021   Hematoma of psoas region due to anticoagulant therapy 05/14/2021   CKD (chronic kidney disease), stage III ([place]) 05/14/2021   Paroxysmal A-fib ([place]) 05/14/2021   Pancreatic pseudocyst/cyst 05/14/2021   History of TIA (transient ischemic attack) 04/16/2020   Anticoagulation adequate 04/16/2020   Dyspepsia 08/21/2019   Trauma 09/23/2017   Debility 06/21/2017   Great toe pain, left 12/02/2016   Vitamin D deficiency 12/02/2016   Hyperglycemia 05/27/2016   Estrogen deficiency 05/27/2016   Overactive bladder 12/07/2015   Allergy 12/07/2015   Multinodular goiter (nontoxic) 04/25/2015   Preventative health care 02/26/2015   History of chicken pox    Renal insufficiency 03/07/2014   HTN (hypertension) 02/19/2014   Hyperlipidemia 02/19/2014   DJD (degenerative joint disease) 02/19/2014   S/P hysterectomy 02/19/2014   Anemia 02/19/2014   Urinary incontinence 02/19/2014    Orientation RESPIRATION BLADDER Height & Weight     Self, Time, Situation, Place  Normal Continent Weight:   Height:     BEHAVIORAL SYMPTOMS/MOOD NEUROLOGICAL BOWEL NUTRITION STATUS      Continent Diet (REGULAR)  AMBULATORY STATUS COMMUNICATION OF NEEDS Skin    Extensive Assist Verbally Normal                       Personal Care Assistance Level of Assistance  Bathing, Feeding, Dressing Bathing Assistance: Limited assistance Feeding assistance: Limited assistance Dressing Assistance: Limited assistance     Functional Limitations Info  Sight, Hearing, Speech Sight Info: Adequate Hearing Info: Adequate Speech Info: Adequate    SPECIAL CARE FACTORS FREQUENCY  PT (By licensed PT), OT (By licensed OT)     PT Frequency: 5 x weekly OT Frequency: 5 x weekly            Contractures Contractures Info: Not present    Additional Factors Info  Code Status Code Status Info: full             Current Medications (05/15/2021):  This is the current hospital active medication list Current Facility-Administered Medications  Medication Dose Route Frequency Provider Last Rate Last Admin   acetaminophen (TYLENOL) tablet 650 mg  650 mg Oral Q6H PRN Toy Baker, MD       Or   acetaminophen (TYLENOL) suppository 650 mg  650 mg Rectal Q6H PRN Doutova, Anastassia, MD       atorvastatin (LIPITOR) tablet 40 mg  40 mg Oral Daily Doutova, Anastassia, MD   40 mg at 05/15/21 0959   cefTRIAXone (ROCEPHIN) 1 g in sodium chloride 0.9 % 100 mL IVPB  1 g Intravenous Q24H Doutova, Anastassia, MD       HYDROcodone-acetaminophen (NORCO/VICODIN) 5-325 MG per tablet 1-2 tablet  1-2 tablet Oral Q4H PRN Doutova,  Nyoka Lint, MD   1 tablet at 05/15/21 0959   ondansetron (ZOFRAN) tablet 4 mg  4 mg Oral Q6H PRN Toy Baker, MD       Or   ondansetron (ZOFRAN) injection 4 mg  4 mg Intravenous Q6H PRN Doutova, Anastassia, MD       oxybutynin (DITROPAN-XL) 24 hr tablet 10 mg  10 mg Oral QHS Doutova, Anastassia, MD   10 mg at 05/15/21 0113   timolol (TIMOPTIC) 0.5 % ophthalmic solution 1 drop  1 drop Both Eyes Daily Eugenie Filler, MD   1 drop at 05/15/21 9767     Discharge Medications: Please see discharge summary for a list of discharge  medications.  Relevant Imaging Results:  Relevant Lab Results:   Additional Information HAL:[id number]  Leeroy Cha, RN

## 2021-05-15 NOTE — TOC Progression Note (Addendum)
Transition of Care [facility]) - Progression Note    Patient Details  Name: Holly Nunez MRN: [id number] Date of Birth: 1924-07-23  [facility] CM/SW Contact  Leeroy Cha, RN Phone Number: 05/15/2021, 1:15 PM  Clinical Narrative:    Pt is now recommending snf placrment for rehab due to weakness. Fl2 and [place]paro [place] [phone] A Fl2 not faxed out until decision is made whether to take home or send to a snf for rehab. Spoke with son Kerry Dory and daughter at [employer] of snf providers given to children of the patient.  These do include [place] and [employer]. Expected Discharge Plan: [place] Barriers to Discharge: No Barriers Identified  Expected Discharge Plan and Services Expected Discharge Plan: [place]   Discharge Planning Services: CM Consult Post Acute Care Choice: [place] arrangements for the past 2 months: Single Family Home Expected Discharge Date: 05/15/21                         HH Arranged: PT, OT, Nurse's Aide HH Agency: [facility] Date [place]: 05/15/21 Time [place]: 1038 Representative spoke with at [place]: [place] ([place]) Interventions    Readmission Risk Interventions No flowsheet data found.

## 2021-05-15 NOTE — Progress Notes (Signed)
Request received for drain placement for the left iliopsoas muscle hematoma.   Case reviewed by Dr. Vernard Gambles, the hematoma is small and appears to be very gelatinous, not a good target for drain placement with IR as the fluid collection most likely will not drain.  As the hematoma does not appeared to be infected, and patient has not s/s of acute infection,  recommends conservative measurement.  Attending provider notified, will delete the IR rad eval order.  Please call IR for questions and concerns.    Holly Gang Jordanne [place] PA-C 05/15/2021 9:50 AM

## 2021-05-15 NOTE — Care Management Obs Status (Signed)
[place] NOTIFICATION   Patient Details  Name: Sparrow [place] MRN: [id number] Date of Birth: [birth year]   Medicare Observation Status Notification Given:  Yes    Leeroy Cha, RN 05/15/2021, 10:40 AM

## 2021-05-15 NOTE — Assessment & Plan Note (Signed)
Order echo, no CP

## 2021-05-15 NOTE — Discharge Instructions (Addendum)
Please do not take the Xarelto for now. It can be resumed if you stop falling. Take a baby aspirin instead.  Please use your walked at home at all times. Physical therapy will come to your home to help you with strengthening     You were cared for by a hospitalist during your hospital stay. If you have any questions about your discharge medications or the care you received while you were in the hospital after you are discharged, you can call the unit and asked to speak with the hospitalist on call if the hospitalist that took care of you is not available. Once you are discharged, your primary care physician will handle any further medical issues.   Please note that NO REFILLS for any discharge medications will be authorized once you are discharged, as it is imperative that you return to your primary care physician (or establish a relationship with a primary care physician if you do not have one) for your aftercare needs so that they can reassess your need for medications and monitor your lab values.  Please take all your medications with you for your next visit with your Primary MD. Please ask your Primary MD to get all Hospital records sent to his/her office. Please request your Primary MD to go over all hospital test results at the follow up.   If you experience worsening of your admission symptoms, develop shortness of breath, chest pain, suicidal or homicidal thoughts or a life threatening emergency, you must seek medical attention immediately by calling 911 or calling your MD.   Dennis Bast must read the complete instructions/literature along with all the possible adverse reactions/side effects for all the medicines you take including new medications that have been prescribed to you. Take new medicines after you have completely understood and accpet all the possible adverse reactions/side effects.    Do not drive when taking pain medications or sedatives.     Do not take more than prescribed Pain,  Sleep and Anxiety Medications   If you have smoked or chewed Tobacco in the last 2 yrs please stop. Stop any regular alcohol  and or recreational drug use.   Wear Seat belts while driving.

## 2021-05-15 NOTE — Discharge Summary (Addendum)
Physician Discharge Summary  Holly Nunez PRF:[id number] DOB: 1925/05/02 DOA: 05/14/2021  PCP: Mosie Lukes, MD  Admit date: 05/14/2021 Discharge date: 05/15/2021  Admitted From: home  Disposition:  home   Recommendations for Outpatient Follow-up:  F/u on back pain and falls As she has fallen twice, I have stopped her Xarelto- PCP can resume it if her weakness improves   Home Health:  ordered  Discharge Condition:  stable   CODE STATUS:  full code   Diet recommendation:  heart healthy Consultations: IR  Procedures/Studies: none   Discharge Diagnoses:  Principal Problem:   Fall Active Problems:    Hematoma of psoas region due to anticoagulant therapy   CKD (chronic kidney disease), stage III (HCC)   Paroxysmal A-fib (HCC)   HTN (hypertension)   Hyperlipidemia   Anemia   Urinary incontinence   Debility   History of TIA (transient ischemic attack)   Brief Summary: This is a 87 year old female who has atrial fibrillation, CKD stage IIIb, hypertension, history of TIA who states that while she was trying to get onto her lift chair on Sunday, she fell to the ground and was not able to get up.  She laid there about 15 minutes before family was able to get her up.  She subsequently noted some left lower back pain.  She fell again on Thursday and this time was found laying on her chest.   In the ED, a CT abdomen and pelvis with contrast found a moderate size complex hematoma involving the left iliopsoas muscle extending from the iliac fossa into the proximal thigh region. A UA was also noted to be positive and she was started on Ceftriaxone.   Hospital Course:  Falls  - CK was normal and thus no rhabdo from her falls -She does not want to go to a skilled nursing facility and will go home with home health.  She lives with her son and also has family nearby to assist her.  Left psoas hematoma/ AOCD - The patient states that after 1 Vicodin, her pain is much more  tolerable. - IR consulted to see if the hematoma could be drained-Dr. Vernard Gambles feels that the hematoma is small, it appears to be gelatinous and is not a good target for drain placement. I have prescribed her a very small amount of Vicodin (which she appears to be tolerating well in the hospital) to be used as needed for severe pain.  She can take Tylenol for mild to moderate pain. - Hgb previously ~ 11 and now 9-10 range - anemia panel is consisting with AOCD -As she has fallen twice, I have stopped her Xarelto- PCP can resume it if her weakness improves  Pyuria - The patient was started on ceftriaxone in the ED.  The patient has no dysuria, no increased frequency no hematuria and therefore I have stopped antibiotics.  CKD stage 3b  HTN - dc Triamterine/HCTZ as elderly patient are at high risk for dehydration and this med will predispose her to more dehydration - cont Carvedilol    Discharge Exam: Vitals:   05/15/21 0919 05/15/21 1018  BP:  (!) 157/78  Pulse: 75 66  Resp:  16  Temp:  97.8 F (36.6 C)  SpO2:  99%   Vitals:   05/15/21 0539 05/15/21 0656 05/15/21 0919 05/15/21 1018  BP: (!) 172/69 (!) 156/62  (!) 157/78  Pulse:  61 75 66  Resp:  20  16  Temp:  (!) 97.5 F (36.4 C)  97.8 F (36.6 C)  TempSrc:  Oral  Oral  SpO2:  100%  99%    General: Pt is alert, awake, not in acute distress Cardiovascular: RRR, S1/S2 +, no rubs, no gallops Respiratory: CTA bilaterally, no wheezing, no rhonchi Abdominal: Soft, NT, ND, bowel sounds + Extremities: no edema, no cyanosis   Discharge Instructions  Discharge Instructions     Diet - low sodium heart healthy   Complete by: As directed    Increase activity slowly   Complete by: As directed       Allergies as of 05/15/2021       Reactions   Alphagan [brimonidine] Other (See Comments)   Secondary infection and vision changes        Medication List     STOP taking these medications     triamterene-hydrochlorothiazide 37.5-25 MG tablet Commonly known as: MAXZIDE-25   Xarelto 15 MG Tabs tablet Generic drug: Rivaroxaban       TAKE these medications    acetaminophen 650 MG CR tablet Commonly known as: TYLENOL Take 1,300 mg by mouth every 8 (eight) hours as needed for pain. What changed: Another medication with the same name was changed. Make sure you understand how and when to take each.   acetaminophen 650 MG CR tablet Commonly known as: TYLENOL Take 2 tablets (1,300 mg total) by mouth every 6 (six) hours as needed for pain (for mild pain). What changed:  when to take this reasons to take this   ASPERCREME EX Apply 1 application topically 3 (three) times daily as needed (pain).   aspirin EC 81 MG tablet Take 1 tablet (81 mg total) by mouth daily. Swallow whole.   atorvastatin 40 MG tablet Commonly known as: LIPITOR TAKE 1 TABLET(40 MG) BY MOUTH DAILY What changed: See the new instructions.   carvedilol 3.125 MG tablet Commonly known as: COREG TAKE 2 TABLETS(6.25 MG) BY MOUTH TWICE DAILY What changed:  how much to take how to take this when to take this additional instructions   famotidine 20 MG tablet Commonly known as: PEPCID Take 20 mg by mouth at bedtime as needed for heartburn or indigestion.   FISH OIL PO Take 1 capsule by mouth daily.   GAS-X PO Take 1 tablet by mouth at bedtime as needed (heartburn/gas).   HYDROcodone-acetaminophen 5-325 MG tablet Commonly known as: NORCO/VICODIN Take 1 tablet by mouth every 4 (four) hours as needed for up to 15 days for moderate pain.   multivitamin tablet Take 1 tablet by mouth daily.   nitroGLYCERIN 0.4 MG SL tablet Commonly known as: NITROSTAT Place 1 tablet (0.4 mg total) under the tongue every 5 (five) minutes as needed for chest pain.   oxybutynin 10 MG 24 hr tablet Commonly known as: DITROPAN-XL TAKE 1 TABLET(10 MG) BY MOUTH AT BEDTIME What changed: See the new instructions.    PROBIOTIC PO Take 1 tablet by mouth daily.   timolol 0.5 % ophthalmic solution Commonly known as: TIMOPTIC Place 1 drop into both eyes at bedtime.        Follow-up Information     Mosie Lukes, MD Follow up.   Specialty: Family Medicine Why: f/u next week with Dr Charlett Blake please Contact information: [address] [place] [address] [phone]                Allergies  Allergen Reactions   Alphagan [Brimonidine] Other (See Comments)    Secondary infection and vision changes  CT Head Wo Contrast  Result Date: 05/14/2021 CLINICAL DATA:  Fall, trauma EXAM: CT HEAD WITHOUT CONTRAST TECHNIQUE: Contiguous axial images were obtained from the base of the skull through the vertex without intravenous contrast. COMPARISON:  Head CT 02/05/2021 FINDINGS: Image quality is degraded by motion artifact. Brain: There is no evidence of acute intracranial hemorrhage, extra-axial fluid collection, or acute infarct. There is mild global parenchymal volume loss with prominence of the ventricular system and extra-axial CSF spaces. Patchy hypodensity in the subcortical and periventricular white matter likely reflects sequela of chronic white matter microangiopathy. There is no mass lesion.  There is no midline shift. Vascular: There is calcification of the bilateral cavernous ICAs and vertebral arteries. Skull: Normal. Negative for fracture or focal lesion. Sinuses/Orbits: The paranasal sinuses are clear. Bilateral lens implants are in place. The globes and orbits are otherwise unremarkable. Other: None. IMPRESSION: No acute intracranial hemorrhage or calvarial fracture. Electronically Signed   By: Valetta Mole M.D.   On: 05/14/2021 15:50   CT Abdomen Pelvis W Contrast  Result Date: 05/14/2021 CLINICAL DATA:  Fall several days ago. EXAM: CT ABDOMEN AND PELVIS WITH CONTRAST TECHNIQUE: Multidetector CT imaging of the abdomen and pelvis was performed using the standard  protocol following bolus administration of intravenous contrast. CONTRAST:  15mL OMNIPAQUE IOHEXOL 300 MG/ML  SOLN COMPARISON:  None. FINDINGS: Lower chest: Bilateral pleural effusions are noted with adjacent subsegmental atelectasis. Hepatobiliary: No focal liver abnormality is seen. No gallstones, gallbladder wall thickening, or biliary dilatation. Pancreas: 2.1 x 1.2 cm bilobed cystic abnormality is noted in pancreatic head. No significant ductal dilatation is noted. No inflammation is noted. Spleen: Normal in size without focal abnormality. Adrenals/Urinary Tract: Adrenal glands appear normal. 7.9 cm right renal cyst is noted. No hydronephrosis or renal obstruction is noted. Urinary bladder is unremarkable. Stomach/Bowel: The stomach appears normal. There is no evidence of bowel obstruction or inflammation. Vascular/Lymphatic: Aortic atherosclerosis. No enlarged abdominal or pelvic lymph nodes. Reproductive: Status post hysterectomy. No adnexal masses. Other: There is heterogeneous material seen in the left iliac fossa which extends into the left hip region concerning for hematoma along the course of the left iliopsoas muscle. Musculoskeletal: No acute or significant osseous findings. IMPRESSION: There appears to be a moderate size complex hematoma involving the left iliopsoas muscle extending from the iliac fossa into the proximal thigh region. Bilateral pleural effusions are noted with adjacent subsegmental atelectasis. 2.1 x 1.2 cm bilobed cystic abnormality is seen in the pancreatic head. Most likely a pseudocyst. Indolent neoplasm, such as intraductal papillary mucinous tumor could look similar. Per consensus criteria, follow-up with preferably pre and post contrast abdominal MRI at 1 year is recommended. This recommendation follows ACR consensus guidelines: Managing Incidental Findings on Abdominal CT: White Paper of the ACR Incidental Findings Committee. J Am Coll Radiol 2010;7:754-773. Aortic  Atherosclerosis (ICD10-I70.0). Electronically Signed   By: Marijo Conception M.D.   On: 05/14/2021 16:01   CT L-SPINE NO CHARGE  Result Date: 05/14/2021 CLINICAL DATA:  Fall EXAM: CT LUMBAR SPINE WITH CONTRAST TECHNIQUE: Multiplanar CT images of the lumbar spine were reconstructed from contemporary CT of the abdomen, and Pelvis COMPARISON:  None. FINDINGS: Segmentation: Standard; the lowest formed disc space is designated L5-S1. Alignment: Normal.  There is no evidence of traumatic malalignment. Vertebrae: The bones are diffusely demineralized. Vertebral body heights are preserved. There is no evidence of acute fracture. Paraspinal and other soft tissues: There is asymmetric thickening of the left iliacus muscle suspicious for intramuscular hematoma.  Bilateral pleural effusions are noted. There is small volume fluid in the presacral space. The abdominal and pelvic viscera are assessed on the separately dictated CT abdomen/pelvis. Disc levels: The disc spaces are overall preserved. There is multilevel facet arthropathy most advanced at L4-L5 and L5-S1. There is no definite significant disc herniation. Facet arthropathy and ligamentum flavum thickening contributes to moderate spinal canal stenosis at L3-L4 and mild-to-moderate spinal canal stenosis at L4-L5. There is moderate left neural foraminal stenosis at L3-L4. IMPRESSION: 1. No acute fracture or traumatic malalignment of the lumbar spine. 2. Intramuscular hematoma in the left iliacus muscle. 3. Relatively mild multilevel degenerative changes throughout the lumbar spine as above. 4. Bilateral pleural effusions. 5. Please also refer to the separately dictated CT abdomen/pelvis. Electronically Signed   By: Valetta Mole M.D.   On: 05/14/2021 16:01   DG Chest Port 1 View  Result Date: 05/14/2021 CLINICAL DATA:  Weakness.  Recent fall. EXAM: PORTABLE CHEST 1 VIEW COMPARISON:  02/06/2019 FINDINGS: Heart size upper normal.  Normal vascularity Bibasilar  atelectasis/infiltrate. Small bilateral effusions. Negative for pulmonary edema. IMPRESSION: Bibasilar atelectasis/infiltrate and small effusions. Electronically Signed   By: Franchot Gallo M.D.   On: 05/14/2021 14:26     The results of significant diagnostics from this hospitalization (including imaging, microbiology, ancillary and laboratory) are listed below for reference.     Microbiology: Recent Results (from the past 240 hour(s))  Resp Panel by RT-PCR (Flu A&B, Covid) Nasopharyngeal Swab     Status: None   Collection Time: 05/14/21  2:30 PM   Specimen: Nasopharyngeal Swab; Nasopharyngeal(NP) swabs in vial transport medium  Result Value Ref Range Status   SARS Coronavirus 2 by RT PCR NEGATIVE NEGATIVE Final    Comment: (NOTE) SARS-CoV-2 target nucleic acids are NOT DETECTED.  The SARS-CoV-2 RNA is generally detectable in upper respiratory specimens during the acute phase of infection. The lowest concentration of SARS-CoV-2 viral copies this assay can detect is 138 copies/mL. A negative result does not preclude SARS-Cov-2 infection and should not be used as the sole basis for treatment or other patient management decisions. A negative result may occur with  improper specimen collection/handling, submission of specimen other than nasopharyngeal swab, presence of viral mutation(s) within the areas targeted by this assay, and inadequate number of viral copies(<138 copies/mL). A negative result must be combined with clinical observations, patient history, and epidemiological information. The expected result is Negative.  Fact Sheet for Patients:  EntrepreneurPulse.com.au  Fact Sheet for Healthcare Providers:  IncredibleEmployment.be  This test is no t yet approved or cleared by the [place] FDA and  has been authorized for detection and/or diagnosis of SARS-CoV-2 by FDA under an Emergency Use Authorization (EUA). This EUA will remain  in  effect (meaning this test can be used) for the duration of the COVID-19 declaration under Section 564(b)(1) of the Act, 21 U.S.C.section 360bbb-3(b)(1), unless the authorization is terminated  or revoked sooner.       Influenza A by PCR NEGATIVE NEGATIVE Final   Influenza B by PCR NEGATIVE NEGATIVE Final    Comment: (NOTE) The Xpert Xpress SARS-CoV-2/FLU/RSV plus assay is intended as an aid in the diagnosis of influenza from Nasopharyngeal swab specimens and should not be used as a sole basis for treatment. Nasal washings and aspirates are unacceptable for Xpert Xpress SARS-CoV-2/FLU/RSV testing.  Fact Sheet for Patients: EntrepreneurPulse.com.au  Fact Sheet for Healthcare Providers: IncredibleEmployment.be  This test is not yet approved or cleared by the [place] and  has been authorized for detection and/or diagnosis of SARS-CoV-2 by FDA under an Emergency Use Authorization (EUA). This EUA will remain in effect (meaning this test can be used) for the duration of the COVID-19 declaration under Section 564(b)(1) of the Act, 21 U.S.C. section 360bbb-3(b)(1), unless the authorization is terminated or revoked.  Performed at [facility], [address] [place], [place] [address]      Labs: BNP (last 3 results) No results for input(s): BNP in the last 8760 hours. Basic Metabolic Panel: Recent Labs  Lab 05/14/21 1412 05/15/21 0028 05/15/21 0520  NA 135 134* 134*  K 3.5 3.5 3.6  CL 102 104 104  CO2 25 25 24   GLUCOSE 155* 109* 105*  BUN 13 12 12   CREATININE 1.05* 0.96 0.98  CALCIUM 9.1 8.5* 8.8*  MG  --  1.7 1.8  PHOS  --  2.8 2.8   Liver Function Tests: Recent Labs  Lab 05/15/21 0028 05/15/21 0520  AST 37 35  ALT 35 33  ALKPHOS 53 57  BILITOT 1.1 1.1  PROT 5.9* 6.0*  ALBUMIN 3.0* 3.1*   No results for input(s): LIPASE, AMYLASE in the last 168 hours. No results for input(s): AMMONIA in the last 168  hours. CBC: Recent Labs  Lab 05/14/21 1412 05/15/21 0028 05/15/21 0520  WBC 5.5 5.6 5.6  NEUTROABS 4.0  --  3.5  HGB 10.4* 9.8* 10.2*  HCT 31.2* 29.4* 30.7*  MCV 97.5 97.7 97.5  PLT 156 170 157   Cardiac Enzymes: Recent Labs  Lab 05/15/21 0028  CKTOTAL 196   BNP: Invalid input(s): POCBNP CBG: No results for input(s): GLUCAP in the last 168 hours. D-Dimer No results for input(s): DDIMER in the last 72 hours. Hgb A1c No results for input(s): HGBA1C in the last 72 hours. Lipid Profile No results for input(s): CHOL, HDL, LDLCALC, TRIG, CHOLHDL, LDLDIRECT in the last 72 hours. Thyroid function studies Recent Labs    05/15/21 0028  TSH 2.019   Anemia work up Recent Labs    05/15/21 0028  VITAMINB12 441  FOLATE 19.2  FERRITIN 59  TIBC 270  IRON 59  RETICCTPCT 2.6   Urinalysis    Component Value Date/Time   COLORURINE YELLOW 05/14/2021 1550   APPEARANCEUR CLOUDY (A) 05/14/2021 1550   LABSPEC 1.020 05/14/2021 1550   PHURINE 6.5 05/14/2021 1550   GLUCOSEU NEGATIVE 05/14/2021 1550   HGBUR SMALL (A) 05/14/2021 1550   BILIRUBINUR NEGATIVE 05/14/2021 1550   KETONESUR NEGATIVE 05/14/2021 1550   PROTEINUR 100 (A) 05/14/2021 1550   NITRITE POSITIVE (A) 05/14/2021 1550   LEUKOCYTESUR SMALL (A) 05/14/2021 1550   Sepsis Labs Invalid input(s): PROCALCITONIN,  WBC,  LACTICIDVEN Microbiology Recent Results (from the past 240 hour(s))  Resp Panel by RT-PCR (Flu A&B, Covid) Nasopharyngeal Swab     Status: None   Collection Time: 05/14/21  2:30 PM   Specimen: Nasopharyngeal Swab; Nasopharyngeal(NP) swabs in vial transport medium  Result Value Ref Range Status   SARS Coronavirus 2 by RT PCR NEGATIVE NEGATIVE Final    Comment: (NOTE) SARS-CoV-2 target nucleic acids are NOT DETECTED.  The SARS-CoV-2 RNA is generally detectable in upper respiratory specimens during the acute phase of infection. The lowest concentration of SARS-CoV-2 viral copies this assay can detect  is 138 copies/mL. A negative result does not preclude SARS-Cov-2 infection and should not be used as the sole basis for treatment or other patient management decisions. A negative result may occur with  improper specimen  collection/handling, submission of specimen other than nasopharyngeal swab, presence of viral mutation(s) within the areas targeted by this assay, and inadequate number of viral copies(<138 copies/mL). A negative result must be combined with clinical observations, patient history, and epidemiological information. The expected result is Negative.  Fact Sheet for Patients:  EntrepreneurPulse.com.au  Fact Sheet for Healthcare Providers:  IncredibleEmployment.be  This test is no t yet approved or cleared by the [place] FDA and  has been authorized for detection and/or diagnosis of SARS-CoV-2 by FDA under an Emergency Use Authorization (EUA). This EUA will remain  in effect (meaning this test can be used) for the duration of the COVID-19 declaration under Section 564(b)(1) of the Act, 21 U.S.C.section 360bbb-3(b)(1), unless the authorization is terminated  or revoked sooner.       Influenza A by PCR NEGATIVE NEGATIVE Final   Influenza B by PCR NEGATIVE NEGATIVE Final    Comment: (NOTE) The Xpert Xpress SARS-CoV-2/FLU/RSV plus assay is intended as an aid in the diagnosis of influenza from Nasopharyngeal swab specimens and should not be used as a sole basis for treatment. Nasal washings and aspirates are unacceptable for Xpert Xpress SARS-CoV-2/FLU/RSV testing.  Fact Sheet for Patients: EntrepreneurPulse.com.au  Fact Sheet for Healthcare Providers: IncredibleEmployment.be  This test is not yet approved or cleared by the [place] FDA and has been authorized for detection and/or diagnosis of SARS-CoV-2 by FDA under an Emergency Use Authorization (EUA). This EUA will remain in effect  (meaning this test can be used) for the duration of the COVID-19 declaration under Section 564(b)(1) of the Act, 21 U.S.C. section 360bbb-3(b)(1), unless the authorization is terminated or revoked.  Performed at [facility], [address] [place], [place] [address]      Time coordinating discharge in minutes: 65  SIGNED:   Debbe Odea, MD  [facility] 05/15/2021, 11:54 AM

## 2021-05-15 NOTE — Evaluation (Signed)
Physical Therapy Evaluation Patient Details Name: Holly Nunez MRN: [id number] DOB: 01-04-1925 Today's Date: 05/15/2021  History of Present Illness  87 year old female adm after  2 falls in 4 days at home, with subsequent back and LT hip pain. PMH:  TIA, arthritis, HLD, HTN, CKD, Diastolic CHF    CT of pelvis = appears to be a moderate size complex hematoma involving the left iliopsoas muscle extending from the iliac fossa into the  proximal thigh region.  Bilateral pleural effusions are noted with adjacent subsegmental  atelectasis.  Clinical Impression  Pt admitted with above diagnosis.   Assisted pt to EOB with min assist and multi-modal cues, incr time.  Pt has great difficulty motor planning/sequencing tasks. Pt unable to keep feet on floor, unable to perform anterior-superior wt shift to transition to stand even when provided facilitation and multi-modal cues. Unsure of pt baseline, no family present.  Recommend SNF unless family able to provide current level of assist.   Pt currently with functional limitations due to the deficits listed below (see PT Problem List). Pt will benefit from skilled PT to increase their independence and safety with mobility to allow discharge to the venue listed below.          Recommendations for follow up therapy are one component of a multi-disciplinary discharge planning process, led by the attending physician.  Recommendations may be updated based on patient status, additional functional criteria and insurance authorization.  Follow Up Recommendations Skilled nursing-short term rehab (<3 hours/day)    Assistance Recommended at Discharge Frequent or constant Supervision/Assistance  Functional Status Assessment Patient has had a recent decline in their functional status and demonstrates the ability to make significant improvements in function in a reasonable and predictable amount of time.  Equipment Recommendations  Other (comment) (TBD)     Recommendations for Other Services       Precautions / Restrictions Precautions Precautions: Fall Precaution Comments: Impulsive Restrictions Weight Bearing Restrictions: No      Mobility  Bed Mobility Overal bed mobility: Needs Assistance Bed Mobility: Supine to Sit;Sit to Supine     Supine to sit: Min assist Sit to supine: Mod assist   General bed mobility comments: pt requiring multi-modal cues, hand over hand to self assist, assist to elevate trunk to sit upright, assist with LEs on to bed after pt gudied to s/l, pt was able to scoot up in supine with multi-modal cues for hand placement and LE positioning    Transfers Overall transfer level: Needs assistance   Transfers: Sit to/from Stand Sit to Stand: Max assist           General transfer comment: unable to perform anterior-superior  wt shift with multi-modal cues and facilitation. cognitive component/decr motor planning appears to be limiting factor more so than pain. pt attempts to put her  feet on therapist's feet, place her feet on top of RW etc. she states she needs her "wool rug" to be able to stand    Ambulation/Gait                  Stairs            Wheelchair Mobility    Modified Rankin (Stroke Patients Only)       Balance Overall balance assessment: Needs assistance Sitting-balance support: Feet unsupported;No upper extremity supported Sitting balance-Leahy Scale: Fair Sitting balance - Comments: Impulsive.     Standing balance-Leahy Scale: Zero Standing balance comment: unable  Pertinent Vitals/Pain Pain Assessment: Faces Pain Score: 7  Faces Pain Scale: Hurts little more Pain Location: Left hip and back Pain Descriptors / Indicators: Grimacing;Sore Pain Intervention(s): Monitored during session;Limited activity within patient's tolerance;Repositioned    Home Living Family/patient expects to be discharged to:: Skilled nursing  facility Living Arrangements: Children Available Help at Discharge: Available PRN/intermittently Type of Home: House         Home Layout: Other (Comment) (Three level with stair/chair lift.) Home Equipment: Rollator (4 wheels) Additional Comments: Pt not reliable historian and no family present. Pt unsure if she has a 2 wheeled RW, and thinks that a friend can give her a wheelchair. Pt may have a lift recliner, but unsure.    Prior Function Prior Level of Function : Patient poor historian/Family not available       Physical Assist : ADLs (physical)   ADLs (physical): IADLs;Bathing Mobility Comments: Pt reports use of Rollator at all times. Uses chair lift to go to upstairs bedroom. ADLs Comments: Assisted by daughter with bathing and medication management. Son or daughter perform all IADLs. Pt unsure on continence. Pt does not drive. Housekeeper 1x/month.     Hand Dominance   Dominant Hand: Left    Extremity/Trunk Assessment   Upper Extremity Assessment Upper Extremity Assessment: Generalized weakness    Lower Extremity Assessment Lower Extremity Assessment: Generalized weakness;RLE deficits/detail;LLE deficits/detail RLE Deficits / Details: grossly 3 to 3+/5, AROM WFL LLE Deficits / Details: able to move through grossly full range-AAROM, strength at least 3/5; testing difficult d/t cognition LLE: Unable to fully assess due to pain       Communication   Communication: HOH  Cognition Arousal/Alertness: Awake/alert Behavior During Therapy: Restless Overall Cognitive Status: No family/caregiver present to determine baseline cognitive functioning Area of Impairment: Memory;Safety/judgement;Orientation;Problem solving;Awareness;Following commands  Decr insight into deficits                 Orientation Level: Time;Disoriented to   Memory: Decreased recall of precautions Following Commands: Follows one step commands inconsistently;Follows multi-step commands  inconsistently Safety/Judgement: Decreased awareness of safety;Decreased awareness of deficits Awareness: Intellectual Problem Solving: Slow processing;Difficulty sequencing;Requires verbal cues;Requires tactile cues General Comments: pt with diminished  motor planning, seems to be limiteing mobility more than pain        General Comments      Exercises     Assessment/Plan    PT Assessment Patient needs continued PT services  PT Problem List Decreased strength;Decreased range of motion;Decreased activity tolerance;Decreased balance;Decreased mobility;Decreased knowledge of use of DME;Pain;Decreased cognition       PT Treatment Interventions DME instruction;Therapeutic activities;Gait training;Functional mobility training;Therapeutic exercise;Patient/family education;Balance training    PT Goals (Current goals can be found in the Care Plan section)  Acute Rehab PT Goals Patient Stated Goal: pt unable to state PT Goal Formulation: Patient unable to participate in goal setting Time For Goal Achievement: 05/29/21 Potential to Achieve Goals: Fair    Frequency Min 2X/week   Barriers to discharge        Co-evaluation               AM-PAC PT "6 Clicks" Mobility  Outcome Measure Help needed turning from your back to your side while in a flat bed without using bedrails?: Total Help needed moving from lying on your back to sitting on the side of a flat bed without using bedrails?: A Lot Help needed moving to and from a bed to a chair (including a wheelchair)?: Total Help needed standing  up from a chair using your arms (e.g., wheelchair or bedside chair)?: Total Help needed to walk in hospital room?: Total Help needed climbing 3-5 steps with a railing? : Total 6 Click Score: 7    End of Session Equipment Utilized During Treatment: Gait belt Activity Tolerance: Other (comment) (limited by cognition) Patient left: in bed;with call bell/phone within reach;with bed alarm  set   PT Visit Diagnosis: Other abnormalities of gait and mobility (R26.89);Difficulty in walking, not elsewhere classified (R26.2)    Time: 6553-7482 PT Time Calculation (min) (ACUTE ONLY): 24 min   Charges:   PT Evaluation $PT Eval Low Complexity: 1 Low PT Treatments $Therapeutic Activity: 8-22 mins        Baxter Flattery, PT  Acute Rehab Dept [facility]) [phone] Pager [phone]  05/15/2021   [facility] 05/15/2021, 12:39 PM

## 2021-05-15 NOTE — TOC Transition Note (Signed)
Transition of Care [facility]) - CM/SW Discharge Note   Patient Details  Name: Holly Nunez MRN: [id number] Date of Birth: 1925/05/15  Transition of Care [facility]) CM/SW Contact:  Leeroy Cha, RN Phone Number: 05/15/2021, 10:38 AM   Clinical Narrative:    Pt dcd to return to home with hhc. Hhc orders sent to [place] for pt ot and aide   Final next level of care: [place] Barriers to Discharge: No Barriers Identified   Patient Goals and CMS Choice Patient states their goals for this hospitalization and ongoing recovery are:: to go home CMS Medicare.gov Compare Post Acute Care list provided to:: Patient Choice offered to / list presented to : Patient  Discharge Placement                       Discharge Plan and Services   Discharge Planning Services: CM Consult Post Acute Care Choice: Home Health                    HH Arranged: PT, OT, Nurse's Aide [place] Agency: [place] Date [place]: 05/15/21 Time [place]: 1038 Representative spoke with at [place]: stacie  Social Determinants of Health ([place]) Interventions     Readmission Risk Interventions No flowsheet data found.

## 2021-05-16 DIAGNOSIS — M545 Low back pain, unspecified: Secondary | ICD-10-CM | POA: Diagnosis not present

## 2021-05-16 LAB — CBC
HCT: 29.2 % — ABNORMAL LOW (ref 36.0–46.0)
HCT: 29.7 % — ABNORMAL LOW (ref 36.0–46.0)
Hemoglobin: 9.5 g/dL — ABNORMAL LOW (ref 12.0–15.0)
Hemoglobin: 9.9 g/dL — ABNORMAL LOW (ref 12.0–15.0)
MCH: 32.1 pg (ref 26.0–34.0)
MCH: 32.7 pg (ref 26.0–34.0)
MCHC: 32.5 g/dL (ref 30.0–36.0)
MCHC: 33.3 g/dL (ref 30.0–36.0)
MCV: 98 fL (ref 80.0–100.0)
MCV: 98.6 fL (ref 80.0–100.0)
Platelets: 162 10*3/uL (ref 150–400)
Platelets: 172 10*3/uL (ref 150–400)
RBC: 2.96 MIL/uL — ABNORMAL LOW (ref 3.87–5.11)
RBC: 3.03 MIL/uL — ABNORMAL LOW (ref 3.87–5.11)
RDW: 14.1 % (ref 11.5–15.5)
RDW: 14.1 % (ref 11.5–15.5)
WBC: 5.2 10*3/uL (ref 4.0–10.5)
WBC: 5.3 10*3/uL (ref 4.0–10.5)
nRBC: 0 % (ref 0.0–0.2)
nRBC: 0 % (ref 0.0–0.2)

## 2021-05-16 LAB — RESP PANEL BY RT-PCR (FLU A&B, COVID) ARPGX2
Influenza A by PCR: NEGATIVE
Influenza B by PCR: NEGATIVE
SARS Coronavirus 2 by RT PCR: NEGATIVE

## 2021-05-16 NOTE — Plan of Care (Signed)
°  Problem: Health Behavior/Discharge Planning: Goal: Ability to manage health-related needs will improve Outcome: Progressing   Problem: Activity: Goal: Risk for activity intolerance will decrease Outcome: Not Progressing   Problem: Nutrition: Goal: Adequate nutrition will be maintained Outcome: Progressing   Problem: Coping: Goal: Level of anxiety will decrease Outcome: Progressing   Problem: Elimination: Goal: Will not experience complications related to bowel motility Outcome: Progressing Goal: Will not experience complications related to urinary retention Outcome: Progressing   Problem: Pain Managment: Goal: General experience of comfort will improve Outcome: Progressing   Problem: Safety: Goal: Ability to remain free from injury will improve Outcome: Progressing   Problem: Skin Integrity: Goal: Risk for impaired skin integrity will decrease Outcome: Progressing

## 2021-05-16 NOTE — TOC Progression Note (Addendum)
Transition of Care [facility]) - Progression Note    Patient Details  Name: Holly Nunez MRN: [id number] Date of Birth: 04/14/1925  Transition of Care [facility]) CM/SW Contact  Leeroy Cha, RN Phone Number: 05/16/2021, 10:03 AM  Clinical Narrative:    Phoebe Perch sent out to [place], [place], [place], and [place].disccussing possiblities with sisters now wbc in a lttile bit with decision. Expected Discharge Plan: [place] Barriers to Discharge: No Barriers Identified  Expected Discharge Plan and Services Expected Discharge Plan: [place]   Discharge Planning Services: CM Consult Post Acute Care Choice: [place] arrangements for the past 2 months: Single Family Home Expected Discharge Date: 05/15/21                         HH Arranged: PT, OT, Nurse's Aide HH Agency: [place] Date [place]: 05/15/21 Time [place]: 1038 Representative spoke with at [place]: [place] ([place]) Interventions    Readmission Risk Interventions No flowsheet data found.

## 2021-05-16 NOTE — TOC Progression Note (Addendum)
Transition of Care [facility]) - Progression Note    Patient Details  Name: Holly Nunez MRN: [id number] Date of Birth: Jun 09, 1924  Transition of Care [facility]) CM/SW Contact  Leeroy Cha, RN Phone Number: 05/16/2021, 11:33 AM  Clinical Narrative:    Tct-[facility] admission coordinator-will look at patient in the hub and call back Tct-[place]-left message lon [place]'s voice to call back.  Patient information given. T[facility]-[place]-no beds available  Tct-[facility] at [facility]-do have beds patient can come tomorrow. If md will scripts for med Message sent to DR. Rizwan to please send written scripts for meds with patient.  [facility] would like for her to go in the am.\ Scientist, forensic at [facility] she cannot come until Monday due to the three night stay required by medicare.  They will not wave this. Son Kerry Dory alerted to the pending transfer in the am to [facility].   Tct-[place] understands that patient can not go to [facility] until Monday. Expected Discharge Plan: [place] Barriers to Discharge: No Barriers Identified  Expected Discharge Plan and Services Expected Discharge Plan: [place]   Discharge Planning Services: CM Consult Post Acute Care Choice: [place] arrangements for the past 2 months: Single Family Home Expected Discharge Date: 05/15/21                         HH Arranged: PT, OT, Nurse's Aide HH Agency: [place] Date [place]: 05/15/21 Time [place]: 1038 Representative spoke with at [place]: [place] ([place]) Interventions    Readmission Risk Interventions No flowsheet data found.

## 2021-05-16 NOTE — Plan of Care (Signed)
TRH   She is stable and waiting for her family to choose a bed or take her home.  Urine cultures has grown multiple bacteria and I have stopped Ceftriaxone  Debbe Odea, MD

## 2021-05-16 NOTE — Progress Notes (Signed)
The patient is injury-free, afebrile, alert, and oriented X 3. Vital signs were within the baseline during this shift. She complained of bilateral leg pain which was controlled with PRN pain regimen. Pt denies chest pain, SOB, nausea, vomiting, dizziness, signs or symptoms of bleeding or infection, or acute changes during this shift. We will continue to monitor and work toward achieving the care plan goals.

## 2021-05-17 DIAGNOSIS — W19XXXA Unspecified fall, initial encounter: Secondary | ICD-10-CM | POA: Diagnosis not present

## 2021-05-17 DIAGNOSIS — R519 Headache, unspecified: Secondary | ICD-10-CM | POA: Diagnosis not present

## 2021-05-17 DIAGNOSIS — W06XXXA Fall from bed, initial encounter: Secondary | ICD-10-CM | POA: Diagnosis not present

## 2021-05-17 DIAGNOSIS — M7981 Nontraumatic hematoma of soft tissue: Secondary | ICD-10-CM | POA: Diagnosis not present

## 2021-05-17 DIAGNOSIS — D649 Anemia, unspecified: Secondary | ICD-10-CM | POA: Diagnosis not present

## 2021-05-17 DIAGNOSIS — I480 Paroxysmal atrial fibrillation: Secondary | ICD-10-CM | POA: Diagnosis not present

## 2021-05-17 DIAGNOSIS — Z20822 Contact with and (suspected) exposure to covid-19: Secondary | ICD-10-CM | POA: Diagnosis not present

## 2021-05-17 DIAGNOSIS — Y92009 Unspecified place in unspecified non-institutional (private) residence as the place of occurrence of the external cause: Secondary | ICD-10-CM | POA: Diagnosis not present

## 2021-05-17 DIAGNOSIS — Z96651 Presence of right artificial knee joint: Secondary | ICD-10-CM | POA: Diagnosis not present

## 2021-05-17 DIAGNOSIS — Z7901 Long term (current) use of anticoagulants: Secondary | ICD-10-CM | POA: Diagnosis not present

## 2021-05-17 DIAGNOSIS — M545 Low back pain, unspecified: Secondary | ICD-10-CM | POA: Diagnosis not present

## 2021-05-17 DIAGNOSIS — Z79899 Other long term (current) drug therapy: Secondary | ICD-10-CM | POA: Diagnosis not present

## 2021-05-17 DIAGNOSIS — N390 Urinary tract infection, site not specified: Secondary | ICD-10-CM | POA: Diagnosis not present

## 2021-05-17 DIAGNOSIS — Z8673 Personal history of transient ischemic attack (TIA), and cerebral infarction without residual deficits: Secondary | ICD-10-CM | POA: Diagnosis not present

## 2021-05-17 DIAGNOSIS — I10 Essential (primary) hypertension: Secondary | ICD-10-CM | POA: Diagnosis not present

## 2021-05-17 DIAGNOSIS — R109 Unspecified abdominal pain: Secondary | ICD-10-CM | POA: Diagnosis not present

## 2021-05-17 NOTE — Plan of Care (Addendum)
Pt aox2-3, pleasantly confused and cooperative with staff.  Await placement for therapy on Monday per TOC notes and family.  RN spoke with son on phone this am, plan is still for [employer] tomorrow AM.  Pain medication per orders.  PT/OT following.   Problem: Activity: Goal: Risk for activity intolerance will decrease Outcome: Progressing   Problem: Nutrition: Goal: Adequate nutrition will be maintained Outcome: Progressing   Problem: Coping: Goal: Level of anxiety will decrease Outcome: Progressing   Problem: Pain Managment: Goal: General experience of comfort will improve Outcome: Progressing   Problem: Safety: Goal: Ability to remain free from injury will improve Outcome: Progressing   Problem: Skin Integrity: Goal: Risk for impaired skin integrity will decrease Outcome: Progressing

## 2021-05-17 NOTE — Progress Notes (Signed)
88 yo with [name] fib, CKD IIIB, HTN, hx TIA who presented after [name] fall. She was found to have Holly Nunez hematoma of the L iliopsoas.  Case was discussed with IR who did not think it warranted drainage.  Her xarelto has been d/c'd and further use deferred to PCP.  She was started on ceftriaxone for pyuria, but urine cx with multiple species and now abx d/c'd.  Patient now awaiting d/c to SNF.  Today's Vitals   05/16/21 2300 05/17/21 0539 05/17/21 0845 05/17/21 1733  BP:  (!) 158/61  (!) 160/101  Pulse:  73  75  Resp:  20  16  Temp:  98.3 F (36.8 C)  98.8 F (37.1 C)  TempSrc:  Oral    SpO2:  98%  99%  PainSc: 0-No pain  4     There is no height or weight on file to calculate BMI.  Denies complaint Pleasantly confused  NAD Unlabored RRR S/nt/nd No LEE Pleasantly confused, [name]&Ox1-2  She's been discharged, awaiting final disposition. See prior notes for additional details No acute needs today.

## 2021-05-17 NOTE — Progress Notes (Signed)
The patient is injury-free, afebrile, alert, and oriented X 1-2. Vital signs were within the baseline during this shift. Pt denies chest pain, SOB, nausea, vomiting, dizziness, signs or symptoms of bleeding, or acute changes during this shift. We will continue to monitor and work toward achieving the care plan goals.

## 2021-05-17 NOTE — Plan of Care (Signed)
°  Problem: Health Behavior/Discharge Planning: Goal: Ability to manage health-related needs will improve Outcome: Progressing   Problem: Nutrition: Goal: Adequate nutrition will be maintained Outcome: Progressing   Problem: Coping: Goal: Level of anxiety will decrease Outcome: Progressing   Problem: Elimination: Goal: Will not experience complications related to bowel motility Outcome: Progressing Goal: Will not experience complications related to urinary retention Outcome: Progressing   Problem: Pain Managment: Goal: General experience of comfort will improve Outcome: Progressing   Problem: Safety: Goal: Ability to remain free from injury will improve Outcome: Progressing   Problem: Skin Integrity: Goal: Risk for impaired skin integrity will decrease Outcome: Progressing

## 2021-05-17 NOTE — Plan of Care (Signed)
Pt aox3-4 with periods of confusion. Pleasant and cooperative with staff. Family at bedside.  IV abx and fluids per orders. Pain management regimen in place. PT/OT following.  Plan for SNF on D/C. TOC working with family on placement.   Problem: Pain Managment: Goal: General experience of comfort will improve Outcome: Progressing   Problem: Safety: Goal: Ability to remain free from injury will improve Outcome: Progressing   Problem: Coping: Goal: Level of anxiety will decrease Outcome: Progressing   Problem: Nutrition: Goal: Adequate nutrition will be maintained Outcome: Progressing   Problem: Activity: Goal: Risk for activity intolerance will decrease Outcome: Progressing

## 2021-05-18 ENCOUNTER — Telehealth: Payer: Self-pay | Admitting: Medical

## 2021-05-18 DIAGNOSIS — I129 Hypertensive chronic kidney disease with stage 1 through stage 4 chronic kidney disease, or unspecified chronic kidney disease: Secondary | ICD-10-CM | POA: Diagnosis not present

## 2021-05-18 DIAGNOSIS — M1990 Unspecified osteoarthritis, unspecified site: Secondary | ICD-10-CM | POA: Diagnosis not present

## 2021-05-18 DIAGNOSIS — Z9181 History of falling: Secondary | ICD-10-CM | POA: Diagnosis not present

## 2021-05-18 DIAGNOSIS — M6281 Muscle weakness (generalized): Secondary | ICD-10-CM | POA: Diagnosis not present

## 2021-05-18 DIAGNOSIS — Z7401 Bed confinement status: Secondary | ICD-10-CM | POA: Diagnosis not present

## 2021-05-18 DIAGNOSIS — S7012XD Contusion of left thigh, subsequent encounter: Secondary | ICD-10-CM | POA: Diagnosis not present

## 2021-05-18 DIAGNOSIS — Z20822 Contact with and (suspected) exposure to covid-19: Secondary | ICD-10-CM | POA: Diagnosis not present

## 2021-05-18 DIAGNOSIS — D649 Anemia, unspecified: Secondary | ICD-10-CM | POA: Diagnosis not present

## 2021-05-18 DIAGNOSIS — M7981 Nontraumatic hematoma of soft tissue: Secondary | ICD-10-CM | POA: Diagnosis not present

## 2021-05-18 DIAGNOSIS — H409 Unspecified glaucoma: Secondary | ICD-10-CM | POA: Diagnosis not present

## 2021-05-18 DIAGNOSIS — R8281 Pyuria: Secondary | ICD-10-CM

## 2021-05-18 DIAGNOSIS — N3281 Overactive bladder: Secondary | ICD-10-CM | POA: Diagnosis not present

## 2021-05-18 DIAGNOSIS — N1832 Chronic kidney disease, stage 3b: Secondary | ICD-10-CM | POA: Diagnosis not present

## 2021-05-18 DIAGNOSIS — M545 Low back pain, unspecified: Secondary | ICD-10-CM | POA: Diagnosis not present

## 2021-05-18 DIAGNOSIS — I10 Essential (primary) hypertension: Secondary | ICD-10-CM | POA: Diagnosis not present

## 2021-05-18 DIAGNOSIS — K8689 Other specified diseases of pancreas: Secondary | ICD-10-CM

## 2021-05-18 DIAGNOSIS — Z8673 Personal history of transient ischemic attack (TIA), and cerebral infarction without residual deficits: Secondary | ICD-10-CM | POA: Diagnosis not present

## 2021-05-18 DIAGNOSIS — Z23 Encounter for immunization: Secondary | ICD-10-CM | POA: Diagnosis not present

## 2021-05-18 DIAGNOSIS — S7011XD Contusion of right thigh, subsequent encounter: Secondary | ICD-10-CM | POA: Diagnosis not present

## 2021-05-18 DIAGNOSIS — Z96651 Presence of right artificial knee joint: Secondary | ICD-10-CM | POA: Diagnosis not present

## 2021-05-18 DIAGNOSIS — I69828 Other speech and language deficits following other cerebrovascular disease: Secondary | ICD-10-CM | POA: Diagnosis not present

## 2021-05-18 DIAGNOSIS — R41841 Cognitive communication deficit: Secondary | ICD-10-CM | POA: Diagnosis not present

## 2021-05-18 DIAGNOSIS — E785 Hyperlipidemia, unspecified: Secondary | ICD-10-CM | POA: Diagnosis not present

## 2021-05-18 DIAGNOSIS — R4189 Other symptoms and signs involving cognitive functions and awareness: Secondary | ICD-10-CM

## 2021-05-18 DIAGNOSIS — R296 Repeated falls: Secondary | ICD-10-CM | POA: Diagnosis not present

## 2021-05-18 DIAGNOSIS — R2689 Other abnormalities of gait and mobility: Secondary | ICD-10-CM | POA: Diagnosis not present

## 2021-05-18 DIAGNOSIS — Z743 Need for continuous supervision: Secondary | ICD-10-CM | POA: Diagnosis not present

## 2021-05-18 DIAGNOSIS — I2510 Atherosclerotic heart disease of native coronary artery without angina pectoris: Secondary | ICD-10-CM | POA: Diagnosis not present

## 2021-05-18 DIAGNOSIS — I130 Hypertensive heart and chronic kidney disease with heart failure and stage 1 through stage 4 chronic kidney disease, or unspecified chronic kidney disease: Secondary | ICD-10-CM | POA: Diagnosis not present

## 2021-05-18 DIAGNOSIS — K869 Disease of pancreas, unspecified: Secondary | ICD-10-CM | POA: Diagnosis not present

## 2021-05-18 DIAGNOSIS — S301XXD Contusion of abdominal wall, subsequent encounter: Secondary | ICD-10-CM | POA: Diagnosis not present

## 2021-05-18 DIAGNOSIS — R262 Difficulty in walking, not elsewhere classified: Secondary | ICD-10-CM | POA: Diagnosis not present

## 2021-05-18 DIAGNOSIS — R2681 Unsteadiness on feet: Secondary | ICD-10-CM | POA: Diagnosis not present

## 2021-05-18 DIAGNOSIS — W19XXXA Unspecified fall, initial encounter: Secondary | ICD-10-CM | POA: Diagnosis not present

## 2021-05-18 DIAGNOSIS — I4891 Unspecified atrial fibrillation: Secondary | ICD-10-CM | POA: Diagnosis not present

## 2021-05-18 DIAGNOSIS — R1312 Dysphagia, oropharyngeal phase: Secondary | ICD-10-CM | POA: Diagnosis not present

## 2021-05-18 LAB — CBC WITH DIFFERENTIAL/PLATELET
Abs Immature Granulocytes: 0.01 10*3/uL (ref 0.00–0.07)
Basophils Absolute: 0 10*3/uL (ref 0.0–0.1)
Basophils Relative: 0 %
Eosinophils Absolute: 0.1 10*3/uL (ref 0.0–0.5)
Eosinophils Relative: 2 %
HCT: 32.1 % — ABNORMAL LOW (ref 36.0–46.0)
Hemoglobin: 10.3 g/dL — ABNORMAL LOW (ref 12.0–15.0)
Immature Granulocytes: 0 %
Lymphocytes Relative: 25 %
Lymphs Abs: 1.3 10*3/uL (ref 0.7–4.0)
MCH: 31.9 pg (ref 26.0–34.0)
MCHC: 32.1 g/dL (ref 30.0–36.0)
MCV: 99.4 fL (ref 80.0–100.0)
Monocytes Absolute: 0.6 10*3/uL (ref 0.1–1.0)
Monocytes Relative: 12 %
Neutro Abs: 3.1 10*3/uL (ref 1.7–7.7)
Neutrophils Relative %: 61 %
Platelets: 179 10*3/uL (ref 150–400)
RBC: 3.23 MIL/uL — ABNORMAL LOW (ref 3.87–5.11)
RDW: 14.1 % (ref 11.5–15.5)
WBC: 5.1 10*3/uL (ref 4.0–10.5)
nRBC: 0 % (ref 0.0–0.2)

## 2021-05-18 LAB — COMPREHENSIVE METABOLIC PANEL
ALT: 26 U/L (ref 0–44)
AST: 30 U/L (ref 15–41)
Albumin: 3 g/dL — ABNORMAL LOW (ref 3.5–5.0)
Alkaline Phosphatase: 52 U/L (ref 38–126)
Anion gap: 3 — ABNORMAL LOW (ref 5–15)
BUN: 13 mg/dL (ref 8–23)
CO2: 29 mmol/L (ref 22–32)
Calcium: 9 mg/dL (ref 8.9–10.3)
Chloride: 105 mmol/L (ref 98–111)
Creatinine, Ser: 1.08 mg/dL — ABNORMAL HIGH (ref 0.44–1.00)
GFR, Estimated: 48 mL/min — ABNORMAL LOW (ref >60)
Glucose, Bld: 116 mg/dL — ABNORMAL HIGH (ref 70–99)
Potassium: 4.6 mmol/L (ref 3.5–5.1)
Sodium: 137 mmol/L (ref 135–145)
Total Bilirubin: 1 mg/dL (ref 0.3–1.2)
Total Protein: 5.5 g/dL — ABNORMAL LOW (ref 6.5–8.1)

## 2021-05-18 LAB — PHOSPHORUS: Phosphorus: 3.7 mg/dL (ref 2.5–4.6)

## 2021-05-18 LAB — MAGNESIUM: Magnesium: 1.9 mg/dL (ref 1.7–2.4)

## 2021-05-18 MED ORDER — LIP MEDEX EX OINT
TOPICAL_OINTMENT | CUTANEOUS | Status: DC | PRN
  Administered 2021-05-18: 75 via TOPICAL
  Filled 2021-05-18: qty 7

## 2021-05-18 NOTE — Assessment & Plan Note (Signed)
Therapy recommending SNF

## 2021-05-18 NOTE — Assessment & Plan Note (Signed)
No formal dx of dementia, but given age, certainly Holly Nunez concern Son notes memory issues past several weeks She's hard of hearing which could contribute to confusion/delirium Follow with outpatient PCP for additional w/u, formal diagnosis

## 2021-05-18 NOTE — Progress Notes (Signed)
Report called and given to [facility], RN at facility. AVS printed  and placed in pt envelope for facility use.

## 2021-05-18 NOTE — Progress Notes (Signed)
PT Cancellation Note  Patient Details Name: Nieve Rojero MRN: [id number] DOB: 03/18/1925   Cancelled Treatment:    Reason Eval/Treat Not Completed: Other (comment)per most recent case management note, looks as if she will likely DC to SNF today. Holding for now per dept protocol  Ann Lions PT, DPT, PN2   Supplemental Physical Therapist [place]    Pager [phone] Acute Rehab Office [phone]

## 2021-05-18 NOTE — Hospital Course (Signed)
88 year old female who has atrial fibrillation, CKD stage IIIb, hypertension, history of TIA who states that while she was trying to get onto her lift chair on Sunday, she fell to the ground and was not able to get up.  She laid there about 15 minutes before family was able to get her up.  She subsequently noted some left lower back pain.  She fell again on Thursday and this time was found laying on her chest.  In the ED, Markeisha Mancias CT abdomen and pelvis with contrast found Elmore Hyslop moderate size complex hematoma involving the left iliopsoas muscle extending from the iliac fossa into the proximal thigh region. Jawara Latorre UA was also noted to be positive and she was started on Ceftriaxone.  She was admitted with concern for her fall and L psoas hematoma.  IR did not think drain needed.  Her xarelto was held on discharge.  She was treated for Mccartney Chuba UTI as well, but urine culture returned with multiple species.  She was discharged to SNF on 05/18/2020.

## 2021-05-18 NOTE — Progress Notes (Signed)
The patient is injury-free, afebrile, alert, and oriented X 2. Vital signs were within the baseline during this shift. Pt denies chest pain, SOB, nausea, vomiting, dizziness, signs or symptoms of bleeding, or acute changes during this shift. We will continue to monitor and work toward achieving the care plan goals

## 2021-05-18 NOTE — Assessment & Plan Note (Signed)
In the setting of fall and anticoagulation Case was discussed with IR by previous hospitalist and they did not think it was [place] Gil[place]ath good target for drain placement Hold xarelto at discharge given recurrent fall with bleed Vicodin prescribed by prior prescriber

## 2021-05-18 NOTE — Assessment & Plan Note (Signed)
Stop xarelto given falls, follow outpatient with PCP to discuss risks/benefits, defer to PCP to consider resumption outpatient if weakness improves and fall risk improves coreg

## 2021-05-18 NOTE — Assessment & Plan Note (Signed)
2.1x1.2 cm bilobed cystic abnormality in the pancreatic head, likely pseudocyst (indolent neoplasm could look similar) Needs follow up with MRI in 1 year, discussed with son

## 2021-05-18 NOTE — Discharge Summary (Signed)
Physician Discharge Summary  Holly Nunez SHF:[id number] DOB: 12-29-1924 DOA: 05/14/2021  PCP: Holly Lukes, MD  Admit date: 05/14/2021 Discharge date: 05/18/2021  Time spent: 40 minutes  Recommendations for Outpatient Follow-up:  Follow CBC/CMP Continue therapy, follow recurrent falls - work up further as indicated Xarelto has been stopped due to recurrent falls, if her weakness improves and fall risk declines, can consider resuming this - defer to PCP Follow CXR outpatient, bilateral effusions Follow MRI pancreas in 1 year for abnormal finding below Follow cognitive deficits outpatient    Discharge Diagnoses:  Principal Problem:   Fall Active Problems:   Psoas hematoma, left, secondary to anticoagulant therapy   Anemia   Unspecified atrial fibrillation (HCC)   Pyuria   CKD (chronic kidney disease), stage III (HCC)   HTN (hypertension)   Cognitive deficits   Pancreatic mass   Hyperlipidemia   Urinary incontinence   Debility   History of TIA (transient ischemic attack)   Pancreatic pseudocyst/cyst   Abnormal ECG   Discharge Condition: stable  Diet recommendation: heart healthy  There were no vitals filed for this visit.  History of present illness:  88 year old female who has atrial fibrillation, CKD stage IIIb, hypertension, history of TIA who states that while she was trying to get onto her lift chair on Sunday, she fell to the ground and was not able to get up.  She laid there about 15 minutes before family was able to get her up.  She subsequently noted some left lower back pain.  She fell again on Thursday and this time was found laying on her chest.  In the ED, Holly Nunez CT abdomen and pelvis with contrast found Holly Nunez moderate size complex hematoma involving the left iliopsoas muscle extending from the iliac fossa into the proximal thigh region. Holly Nunez UA was also noted to be positive and she was started on Ceftriaxone.  She was admitted with concern for her fall and L psoas  hematoma.  IR did not think drain needed.  Her xarelto was held on discharge.  She was treated for Holly Nunez UTI as well, but urine culture returned with multiple species.  She was discharged to SNF on 05/18/2020.  Hospital Course:  * Fall- (present on admission) Therapy recommending SNF   Psoas hematoma, left, secondary to anticoagulant therapy In the setting of fall and anticoagulation Case was discussed with IR by previous hospitalist and they did not think it was Holly Nunez good target for drain placement Hold xarelto at discharge given recurrent fall with bleed Vicodin prescribed by prior prescriber  Unspecified atrial fibrillation (Holly Nunez) Stop xarelto given falls, follow outpatient with PCP to discuss risks/benefits, defer to PCP to consider resumption outpatient if weakness improves and fall risk improves coreg  Anemia- (present on admission) Labs c/w AOCD  Pyuria Urine cx with multiple species Abx d/c'd, follow  CKD (chronic kidney disease), stage III (Holly Nunez)- (present on admission) Triamterene HCTZ d/c'd  Follow outpatient  HTN (hypertension)- (present on admission) Holding triameterene HCTZ at discharge Continue coreg  Cognitive deficits No formal dx of dementia, but given age, certainly Holly Nunez concern Son notes memory issues past several weeks She's hard of hearing which could contribute to confusion/delirium Follow with outpatient PCP for additional w/u, formal diagnosis   Pancreatic mass 2.1x1.2 cm bilobed cystic abnormality in the pancreatic head, likely pseudocyst (indolent neoplasm could look similar) Needs follow up with MRI in 1 year, discussed with son    Procedures: Echo IMPRESSIONS     1. Left ventricular ejection  fraction, by estimation, is 60 to 65%. Left  ventricular ejection fraction by 2D MOD biplane is 62.7 %. The left  ventricle has normal function. The left ventricle has no regional wall  motion abnormalities. There is moderate  concentric left ventricular  hypertrophy. Left ventricular diastolic  parameters are consistent with Grade II diastolic dysfunction  (pseudonormalization). Elevated left ventricular end-diastolic pressure.   2. Right ventricular systolic function is normal. The right ventricular  size is normal. There is mildly elevated pulmonary artery systolic  pressure. The estimated right ventricular systolic pressure is 49.7 mmHg.   3. Left atrial size was mildly dilated.   4. The mitral valve is degenerative. Trivial mitral valve regurgitation.  No evidence of mitral stenosis.   5. The aortic valve is tricuspid. There is mild calcification of the  aortic valve. There is mild thickening of the aortic valve. Aortic valve  regurgitation is not visualized. Aortic valve sclerosis is present, with  no evidence of aortic valve stenosis.   6. The inferior vena cava is normal in size with greater than 50%  respiratory variability, suggesting right atrial pressure of 3 mmHg.    Consultations: none  Discharge Exam: Vitals:   05/17/21 2000 05/18/21 0552  BP: 136/78 (!) 143/87  Pulse: 73 78  Resp: 15 16  Temp: 97.8 F (36.6 C) 98.2 F (36.8 C)  SpO2: 98% 97%   Eager to discharge Holly Nunez&Ox2 (able to say it was January/New Years with prompting)  General: No acute distress. Cardiovascular: RRR Lungs: unlabored Abdomen: Soft, nontender, nondistended  Neurological: Alert and oriented 2. Moves all extremities 4 . Cranial nerves II through XII grossly intact. Skin: Warm and dry. No rashes or lesions. Extremities: No clubbing or cyanosis. No edema.  Discharge Instructions   Discharge Instructions     Diet - low sodium heart healthy   Complete by: As directed    Diet - low sodium heart healthy   Complete by: As directed    Discharge instructions   Complete by: As directed    You were seen after Holly Nunez fall and found to have Holly Nunez left psoas hematoma.  This was not amenable to drainage by IR.    We've stopped your xarelto.  Please  follow up with your PCP to determine if this is something that should be stopped long term.   We've adjusted your blood pressure meds.  Stop the triameterene and HCTZ.    You had abnormal imaging of the pancreas which will need follow up in 1 year.     Follow Lavell Ridings chest x ray outpatient with your PCP.  You should have Hazael Olveda formal dementia evaluation outpatient.  Return for new, recurrent, or worsening symptoms.  Please ask your PCP to request records from this hospitalization so they know what was done and what the next steps will be.   Increase activity slowly   Complete by: As directed    Increase activity slowly   Complete by: As directed       Allergies as of 05/18/2021       Reactions   Alphagan [brimonidine] Other (See Comments)   Secondary infection and vision changes        Medication List     STOP taking these medications    triamterene-hydrochlorothiazide 37.5-25 MG tablet Commonly known as: MAXZIDE-25   Xarelto 15 MG Tabs tablet Generic drug: Rivaroxaban       TAKE these medications    acetaminophen 650 MG CR tablet Commonly known as: TYLENOL Take  1,300 mg by mouth every 8 (eight) hours as needed for pain. What changed: Another medication with the same name was changed. Make sure you understand how and when to take each.   acetaminophen 650 MG CR tablet Commonly known as: TYLENOL Take 2 tablets (1,300 mg total) by mouth every 6 (six) hours as needed for pain (for mild pain). What changed:  when to take this reasons to take this   ASPERCREME EX Apply 1 application topically 3 (three) times daily as needed (pain).   aspirin EC 81 MG tablet Take 1 tablet (81 mg total) by mouth daily. Swallow whole.   atorvastatin 40 MG tablet Commonly known as: LIPITOR TAKE 1 TABLET(40 MG) BY MOUTH DAILY What changed: See the new instructions.   carvedilol 3.125 MG tablet Commonly known as: COREG TAKE 2 TABLETS(6.25 MG) BY MOUTH TWICE DAILY What changed:  how  much to take how to take this when to take this additional instructions   famotidine 20 MG tablet Commonly known as: PEPCID Take 20 mg by mouth at bedtime as needed for heartburn or indigestion.   FISH OIL PO Take 1 capsule by mouth daily.   GAS-X PO Take 1 tablet by mouth at bedtime as needed (heartburn/gas).   HYDROcodone-acetaminophen 5-325 MG tablet Commonly known as: NORCO/VICODIN Take 1 tablet by mouth every 4 (four) hours as needed for up to 15 days for moderate pain.   multivitamin tablet Take 1 tablet by mouth daily.   nitroGLYCERIN 0.4 MG SL tablet Commonly known as: NITROSTAT Place 1 tablet (0.4 mg total) under the tongue every 5 (five) minutes as needed for chest pain.   oxybutynin 10 MG 24 hr tablet Commonly known as: DITROPAN-XL TAKE 1 TABLET(10 MG) BY MOUTH AT BEDTIME What changed: See the new instructions.   PROBIOTIC PO Take 1 tablet by mouth daily.   timolol 0.5 % ophthalmic solution Commonly known as: TIMOPTIC Place 1 drop into both eyes at bedtime.       Allergies  Allergen Reactions   Alphagan [Brimonidine] Other (See Comments)    Secondary infection and vision changes    Contact information for follow-up providers     Holly Lukes, MD Follow up.   Specialty: Family Medicine Why: f/u next week with Dr Charlett Blake please Contact information: [address] [place] [address] [phone]              Contact information for after-discharge care     Destination     [facility] .   Service: Skilled Nursing Contact information: [address] [place] [phone]                      The results of significant diagnostics from this hospitalization (including imaging, microbiology, ancillary and laboratory) are listed below for reference.    Significant Diagnostic Studies: CT Head Wo Contrast  Result Date: 05/14/2021 CLINICAL DATA:   Fall, trauma EXAM: CT HEAD WITHOUT CONTRAST TECHNIQUE: Contiguous axial images were obtained from the base of the skull through the vertex without intravenous contrast. COMPARISON:  Head CT 02/05/2021 FINDINGS: Image quality is degraded by motion artifact. Brain: There is no evidence of acute intracranial hemorrhage, extra-axial fluid collection, or acute infarct. There is mild global parenchymal volume loss with prominence of the ventricular system and extra-axial CSF spaces. Patchy hypodensity in the subcortical and periventricular white matter likely reflects sequela of chronic white matter microangiopathy.  There is no mass lesion.  There is no midline shift. Vascular: There is calcification of the bilateral cavernous ICAs and vertebral arteries. Skull: Normal. Negative for fracture or focal lesion. Sinuses/Orbits: The paranasal sinuses are clear. Bilateral lens implants are in place. The globes and orbits are otherwise unremarkable. Other: None. IMPRESSION: No acute intracranial hemorrhage or calvarial fracture. Electronically Signed   By: Valetta Mole M.D.   On: 05/14/2021 15:50   CT Abdomen Pelvis W Contrast  Result Date: 05/14/2021 CLINICAL DATA:  Fall several days ago. EXAM: CT ABDOMEN AND PELVIS WITH CONTRAST TECHNIQUE: Multidetector CT imaging of the abdomen and pelvis was performed using the standard protocol following bolus administration of intravenous contrast. CONTRAST:  13mL OMNIPAQUE IOHEXOL 300 MG/ML  SOLN COMPARISON:  None. FINDINGS: Lower chest: Bilateral pleural effusions are noted with adjacent subsegmental atelectasis. Hepatobiliary: No focal liver abnormality is seen. No gallstones, gallbladder wall thickening, or biliary dilatation. Pancreas: 2.1 x 1.2 cm bilobed cystic abnormality is noted in pancreatic head. No significant ductal dilatation is noted. No inflammation is noted. Spleen: Normal in size without focal abnormality. Adrenals/Urinary Tract: Adrenal glands appear normal. 7.9  cm right renal cyst is noted. No hydronephrosis or renal obstruction is noted. Urinary bladder is unremarkable. Stomach/Bowel: The stomach appears normal. There is no evidence of bowel obstruction or inflammation. Vascular/Lymphatic: Aortic atherosclerosis. No enlarged abdominal or pelvic lymph nodes. Reproductive: Status post hysterectomy. No adnexal masses. Other: There is heterogeneous material seen in the left iliac fossa which extends into the left hip region concerning for hematoma along the course of the left iliopsoas muscle. Musculoskeletal: No acute or significant osseous findings. IMPRESSION: There appears to be Kason Benak moderate size complex hematoma involving the left iliopsoas muscle extending from the iliac fossa into the proximal thigh region. Bilateral pleural effusions are noted with adjacent subsegmental atelectasis. 2.1 x 1.2 cm bilobed cystic abnormality is seen in the pancreatic head. 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 Indolent neoplasm, such as intraductal papillary mucinous tumor could look similar. Per consensus criteria, follow-up with preferably pre and post contrast abdominal MRI at 1 year is recommended. This recommendation follows ACR consensus guidelines: Managing Incidental Findings on Abdominal CT: White Paper of the ACR Incidental Findings Committee. J Am Coll Radiol 2010;7:754-773. Aortic Atherosclerosis (ICD10-I70.0). Electronically Signed   By: Marijo Conception M.D.   On: 05/14/2021 16:01   CT L-SPINE NO CHARGE  Result Date: 05/14/2021 CLINICAL DATA:  Fall EXAM: CT LUMBAR SPINE WITH CONTRAST TECHNIQUE: Multiplanar CT images of the lumbar spine were reconstructed from contemporary CT of the abdomen, and Pelvis COMPARISON:  None. FINDINGS: Segmentation: Standard; the lowest formed disc space is designated L5-S1. Alignment: Normal.  There is no evidence of traumatic malalignment. Vertebrae: The bones are diffusely demineralized. Vertebral body heights are preserved. There is no evidence of  acute fracture. Paraspinal and other soft tissues: There is asymmetric thickening of the left iliacus muscle suspicious for intramuscular hematoma. Bilateral pleural effusions are noted. There is small volume fluid in the presacral space. The abdominal and pelvic viscera are assessed on the separately dictated CT abdomen/pelvis. Disc levels: The disc spaces are overall preserved. There is multilevel facet arthropathy most advanced at L4-L5 and L5-S1. There is no definite significant disc herniation. Facet arthropathy and ligamentum flavum thickening contributes to moderate spinal canal stenosis at L3-L4 and mild-to-moderate spinal canal stenosis at L4-L5. There is moderate left neural foraminal stenosis at L3-L4. IMPRESSION: 1. No acute fracture or traumatic malalignment of the lumbar spine. 2. Intramuscular hematoma  in the left iliacus muscle. 3. Relatively mild multilevel degenerative changes throughout the lumbar spine as above. 4. Bilateral pleural effusions. 5. Please also refer to the separately dictated CT abdomen/pelvis. Electronically Signed   By: Valetta Mole M.D.   On: 05/14/2021 16:01   DG Chest Port 1 View  Result Date: 05/14/2021 CLINICAL DATA:  Weakness.  Recent fall. EXAM: PORTABLE CHEST 1 VIEW COMPARISON:  02/06/2019 FINDINGS: Heart size upper normal.  Normal vascularity Bibasilar atelectasis/infiltrate. Small bilateral effusions. Negative for pulmonary edema. IMPRESSION: Bibasilar atelectasis/infiltrate and small effusions. Electronically Signed   By: Franchot Gallo M.D.   On: 05/14/2021 14:26   ECHOCARDIOGRAM COMPLETE  Result Date: 05/15/2021    ECHOCARDIOGRAM REPORT   Patient Name:   LLESENIA FOGAL Date of Exam: 05/15/2021 Medical Rec #:  [id number]      Height:       60.0 in Accession #:    [id number]     Weight:       148.0 lb Date of Birth:  Mar 25, 1925     BSA:          1.642 m Patient Age:    88 years       BP:           156/62 mmHg Patient Gender: F              HR:           64  bpm. Exam Location:  Inpatient Procedure: 2D Echo, Color Doppler and Cardiac Doppler Indications:    Abnormal EKG  History:        Patient has no prior history of Echocardiogram examinations.                 TIA, Arrythmias:Atrial Fibrillation; Risk Factors:Hypertension.  Sonographer:    Jyl Heinz Referring Phys: [place]  1. Left ventricular ejection fraction, by estimation, is 60 to 65%. Left ventricular ejection fraction by 2D MOD biplane is 62.7 %. The left ventricle has normal function. The left ventricle has no regional wall motion abnormalities. There is moderate concentric left ventricular hypertrophy. Left ventricular diastolic parameters are consistent with Grade II diastolic dysfunction (pseudonormalization). Elevated left ventricular end-diastolic pressure.  2. Right ventricular systolic function is normal. The right ventricular size is normal. There is mildly elevated pulmonary artery systolic pressure. The estimated right ventricular systolic pressure is 67.5 mmHg.  3. Left atrial size was mildly dilated.  4. The mitral valve is degenerative. Trivial mitral valve regurgitation. No evidence of mitral stenosis.  5. The aortic valve is tricuspid. There is mild calcification of the aortic valve. There is mild thickening of the aortic valve. Aortic valve regurgitation is not visualized. Aortic valve sclerosis is present, with no evidence of aortic valve stenosis.  6. The inferior vena cava is normal in size with greater than 50% respiratory variability, suggesting right atrial pressure of 3 mmHg. FINDINGS  Left Ventricle: Left ventricular ejection fraction, by estimation, is 60 to 65%. Left ventricular ejection fraction by 2D MOD biplane is 62.7 %. The left ventricle has normal function. The left ventricle has no regional wall motion abnormalities. The left ventricular internal cavity size was normal in size. There is moderate concentric left ventricular hypertrophy. Left  ventricular diastolic parameters are consistent with Grade II diastolic dysfunction (pseudonormalization). Elevated left ventricular  end-diastolic pressure. The E/e' is 22.7. Right Ventricle: The right ventricular size is normal. No increase in right ventricular wall thickness. Right ventricular systolic function is normal. There  is mildly elevated pulmonary artery systolic pressure. The tricuspid regurgitant velocity is 3.23  m/s, and with an assumed right atrial pressure of 3 mmHg, the estimated right ventricular systolic pressure is 75.1 mmHg. Left Atrium: Left atrial size was mildly dilated. Right Atrium: Right atrial size was normal in size. Pericardium: There is no evidence of pericardial effusion. Mitral Valve: The mitral valve is degenerative in appearance. Mild to moderate mitral annular calcification. Trivial mitral valve regurgitation. No evidence of mitral valve stenosis. Tricuspid Valve: The tricuspid valve is grossly normal. Tricuspid valve regurgitation is mild . No evidence of tricuspid stenosis. Aortic Valve: The aortic valve is tricuspid. There is mild calcification of the aortic valve. There is mild thickening of the aortic valve. Aortic valve regurgitation is not visualized. Aortic valve sclerosis is present, with no evidence of aortic valve stenosis. Aortic valve peak gradient measures 7.0 mmHg. Pulmonic Valve: The pulmonic valve was grossly normal. Pulmonic valve regurgitation is not visualized. No evidence of pulmonic stenosis. Aorta: The aortic root and ascending aorta are structurally normal, with no evidence of dilitation. Venous: The inferior vena cava is normal in size with greater than 50% respiratory variability, suggesting right atrial pressure of 3 mmHg. IAS/Shunts: The atrial septum is grossly normal.  LEFT VENTRICLE PLAX 2D                        Biplane EF (MOD) LVIDd:         3.40 cm         LV Biplane EF:   Left LVIDs:         2.20 cm                          ventricular LV PW:          1.10 cm                          ejection LV IVS:        1.50 cm                          fraction by LVOT diam:     1.80 cm                          2D MOD LV SV:         70                               biplane is LV SV Index:   42                               62.7 %. LVOT Area:     2.54 cm                                Diastology                                LV e' medial:    4.12 cm/s LV Volumes (MOD)               LV E/e' medial:  21.3 LV vol d, MOD    65.9 ml       LV e' lateral:   3.86 cm/s A2C:                           LV E/e' lateral: 22.7 LV vol d, MOD    68.9 ml A4C: LV vol s, MOD    24.3 ml A2C: LV vol s, MOD    25.6 ml A4C: LV SV MOD A2C:   41.6 ml LV SV MOD A4C:   68.9 ml LV SV MOD BP:    42.5 ml RIGHT VENTRICLE             IVC RV Basal diam:  3.30 cm     IVC diam: 1.50 cm RV Mid diam:    2.10 cm RV S prime:     12.90 cm/s TAPSE (M-mode): 2.3 cm LEFT ATRIUM           Index        RIGHT ATRIUM           Index LA diam:      3.60 cm 2.19 cm/m   RA Area:     10.90 cm LA Vol (A2C): 57.2 ml 34.83 ml/m  RA Volume:   23.10 ml  14.06 ml/m LA Vol (A4C): 32.9 ml 20.03 ml/m  AORTIC VALVE AV Area (Vmax): 2.10 cm AV Vmax:        132.00 cm/s AV Peak Grad:   7.0 mmHg LVOT Vmax:      109.00 cm/s LVOT Vmean:     87.100 cm/s LVOT VTI:       0.274 m  AORTA Ao Root diam: 2.90 cm Ao Asc diam:  3.30 cm MITRAL VALVE               TRICUSPID VALVE MV Area (PHT): 3.17 cm    TR Peak grad:   41.7 mmHg MV Decel Time: 239 msec    TR Vmax:        323.00 cm/s MV E velocity: 87.80 cm/s MV Lenita Peregrina velocity: 93.00 cm/s  SHUNTS MV E/Naszir Cott ratio:  0.94        Systemic VTI:  0.27 m                            Systemic Diam: 1.80 cm Eleonore Chiquito MD Electronically signed by Eleonore Chiquito MD Signature Date/Time: 05/15/2021/1:30:58 PM    Final     Microbiology: Recent Results (from the past 240 hour(s))  Resp Panel by RT-PCR (Flu Miriya Cloer&B, Covid) Nasopharyngeal Swab     Status: None   Collection Time: 05/14/21  2:30 PM   Specimen:  Nasopharyngeal Swab; Nasopharyngeal(NP) swabs in vial transport medium  Result Value Ref Range Status   SARS Coronavirus 2 by RT PCR NEGATIVE NEGATIVE Final    Comment: (NOTE) SARS-CoV-2 target nucleic acids are NOT DETECTED.  The SARS-CoV-2 RNA is generally detectable in upper respiratory specimens during the acute phase of infection. The lowest concentration of SARS-CoV-2 viral copies this assay can detect is 138 copies/mL. Carlisia Geno negative result does not preclude SARS-Cov-2 infection and should not be used as the sole basis for treatment or other patient management decisions. Jewelle Whitner negative result may occur with  improper specimen collection/handling, submission of specimen other than nasopharyngeal swab, presence of viral mutation(s) within the areas targeted by this assay, and inadequate number of viral copies(<138 copies/mL). Timmya Blazier  negative result must be combined with clinical observations, patient history, and epidemiological information. The expected result is Negative.  Fact Sheet for Patients:  EntrepreneurPulse.com.au  Fact Sheet for Healthcare Providers:  IncredibleEmployment.be  This test is no t yet approved or cleared by the [place] FDA and  has been authorized for detection and/or diagnosis of SARS-CoV-2 by FDA under an Emergency Use Authorization (EUA). This EUA will remain  in effect (meaning this test can be used) for the duration of the COVID-19 declaration under Section 564(b)(1) of the Act, 21 U.S.C.section 360bbb-3(b)(1), unless the authorization is terminated  or revoked sooner.       Influenza Rhyder Koegel by PCR NEGATIVE NEGATIVE Final   Influenza B by PCR NEGATIVE NEGATIVE Final    Comment: (NOTE) The Xpert Xpress SARS-CoV-2/FLU/RSV plus assay is intended as an aid in the diagnosis of influenza from Nasopharyngeal swab specimens and should not be used as Takiah Maiden sole basis for treatment. Nasal washings and aspirates are unacceptable for  Xpert Xpress SARS-CoV-2/FLU/RSV testing.  Fact Sheet for Patients: EntrepreneurPulse.com.au  Fact Sheet for Healthcare Providers: IncredibleEmployment.be  This test is not yet approved or cleared by the [place] FDA and has been authorized for detection and/or diagnosis of SARS-CoV-2 by FDA under an Emergency Use Authorization (EUA). This EUA will remain in effect (meaning this test can be used) for the duration of the COVID-19 declaration under Section 564(b)(1) of the Act, 21 U.S.C. section 360bbb-3(b)(1), unless the authorization is terminated or revoked.  Performed at [facility], [address] [place], [place] [address]   Urine Culture     Status: Abnormal   Collection Time: 05/14/21  5:20 PM   Specimen: Urine, Clean Catch  Result Value Ref Range Status   Specimen Description   Final    URINE, CLEAN CATCH Performed at [facility], [address] [place], [place] [address]    Special Requests   Final    NONE Performed at [facility], [address] [place], [place] [address]    Culture MULTIPLE SPECIES PRESENT, SUGGEST RECOLLECTION (Symphany Fleissner)  Final   Report Status 05/15/2021 FINAL  Final  Resp Panel by RT-PCR (Flu Haasini Patnaude&B, Covid) Nasopharyngeal Swab     Status: None   Collection Time: 05/16/21 11:16 AM   Specimen: Nasopharyngeal Swab; Nasopharyngeal(NP) swabs in vial transport medium  Result Value Ref Range Status   SARS Coronavirus 2 by RT PCR NEGATIVE NEGATIVE Final    Comment: (NOTE) SARS-CoV-2 target nucleic acids are NOT DETECTED.  The SARS-CoV-2 RNA is generally detectable in upper respiratory specimens during the acute phase of infection. The lowest concentration of SARS-CoV-2 viral copies this assay can detect is 138 copies/mL. Lisandro Meggett negative result does not preclude SARS-Cov-2 infection and should not be used as the sole basis for treatment or other patient management decisions. Shondrea Steinert negative  result may occur with  improper specimen collection/handling, submission of specimen other than nasopharyngeal swab, presence of viral mutation(s) within the areas targeted by this assay, and inadequate number of viral copies(<138 copies/mL). Sloan Galentine negative result must be combined with clinical observations, patient history, and epidemiological information. The expected result is Negative.  Fact Sheet for Patients:  EntrepreneurPulse.com.au  Fact Sheet for Healthcare Providers:  IncredibleEmployment.be  This test is no t yet approved or cleared by the [place] FDA and  has been authorized for detection and/or diagnosis of SARS-CoV-2 by FDA under an Emergency Use Authorization (EUA). This EUA will remain  in effect (meaning this test can be used) for the duration of the COVID-19 declaration under Section 564(b)(1) of the Act, 21 U.S.C.section 360bbb-3(b)(1), unless the authorization is terminated  or revoked sooner.       Influenza Lamario Mani by PCR NEGATIVE NEGATIVE Final   Influenza B by PCR NEGATIVE NEGATIVE Final    Comment: (NOTE) The Xpert Xpress SARS-CoV-2/FLU/RSV plus assay is intended as an aid in the diagnosis of influenza from Nasopharyngeal swab specimens and should not be used as Paisley Grajeda sole basis for treatment. Nasal washings and aspirates are unacceptable for Xpert Xpress SARS-CoV-2/FLU/RSV testing.  Fact Sheet for Patients: EntrepreneurPulse.com.au  Fact Sheet for Healthcare Providers: IncredibleEmployment.be  This test is not yet approved or cleared by the [place] FDA and has been authorized for detection and/or diagnosis of SARS-CoV-2 by FDA under an Emergency Use Authorization (EUA). This EUA will remain in effect (meaning this test can be used) for the duration of the COVID-19 declaration under Section 564(b)(1) of the Act, 21 U.S.C. section 360bbb-3(b)(1), unless the authorization is  terminated or revoked.  Performed at [facility], [place] [address] [place], [place] [address]      Labs: Basic Metabolic Panel: Recent Labs  Lab 05/14/21 1412 05/15/21 0028 05/15/21 0520 05/18/21 0533  NA 135 134* 134* 137  K 3.5 3.5 3.6 4.6  CL 102 104 104 105  CO2 25 25 24 29   GLUCOSE 155* 109* 105* 116*  BUN 13 12 12 13   CREATININE 1.05* 0.96 0.98 1.08*  CALCIUM 9.1 8.5* 8.8* 9.0  MG  --  1.7 1.8 1.9  PHOS  --  2.8 2.8 3.7   Liver Function Tests: Recent Labs  Lab 05/15/21 0028 05/15/21 0520 05/18/21 0533  AST 37 35 30  ALT 35 33 26  ALKPHOS 53 57 52  BILITOT 1.1 1.1 1.0  PROT 5.9* 6.0* 5.5*  ALBUMIN 3.0* 3.1* 3.0*   No results for input(s): LIPASE, AMYLASE in the last 168 hours. No results for input(s): AMMONIA in the last 168 hours. CBC: Recent Labs  Lab 05/14/21 1412 05/15/21 0028 05/15/21 0520 05/15/21 1642 05/15/21 2331 05/16/21 0608 05/18/21 0533  WBC 5.5   < > 5.6 5.3 5.3 5.2 5.1  NEUTROABS 4.0  --  3.5  --   --   --  3.1  HGB 10.4*   < > 10.2* 10.2* 9.9* 9.5* 10.3*  HCT 31.2*   < > 30.7* 31.3* 29.7* 29.2* 32.1*  MCV 97.5   < > 97.5 100.0 98.0 98.6 99.4  PLT 156   < > 157 165 172 162 179   < > = values in this interval not displayed.   Cardiac Enzymes: Recent Labs  Lab 05/15/21 0028  CKTOTAL 196   BNP: BNP (last 3 results) No results for input(s): BNP in the last 8760 hours.  ProBNP (last 3 results) No results for input(s): PROBNP in the last 8760 hours.  CBG: No results for input(s): GLUCAP in the last 168 hours.     Signed:  Fayrene Helper MD.  [facility] 05/18/2021, 11:31 AM

## 2021-05-18 NOTE — Plan of Care (Signed)
°  Problem: Health Behavior/Discharge Planning: Goal: Ability to manage health-related needs will improve Outcome: Progressing   Problem: Nutrition: Goal: Adequate nutrition will be maintained Outcome: Progressing   Problem: Coping: Goal: Level of anxiety will decrease Outcome: Progressing   Problem: Elimination: Goal: Will not experience complications related to bowel motility Outcome: Progressing   Problem: Pain Managment: Goal: General experience of comfort will improve Outcome: Progressing   Problem: Safety: Goal: Ability to remain free from injury will improve Outcome: Progressing   Problem: Skin Integrity: Goal: Risk for impaired skin integrity will decrease Outcome: Progressing

## 2021-05-18 NOTE — Assessment & Plan Note (Signed)
Triamterene HCTZ d/c'd  Follow outpatient

## 2021-05-18 NOTE — Assessment & Plan Note (Signed)
Labs c/w AOCD

## 2021-05-18 NOTE — Assessment & Plan Note (Signed)
Urine cx with multiple species Abx d/c'd, follow

## 2021-05-18 NOTE — Telephone Encounter (Signed)
Please notify pt that it is not recommended to do virtual visit to evaluate musculoskeletal issue particularly in her age group. I will very likely order xray and maybe even ultrasound of lower extremity based on her complaint after review of her ED visit this may very likely be indicated. If has to get imaging studies will have to come in anyway.

## 2021-05-18 NOTE — TOC Transition Note (Signed)
Transition of Care [facility]) - CM/SW Discharge Note   Patient Details  Name: Holly Nunez MRN: [id number] Date of Birth: [birth year]  [facility]) CM/SW Contact:  Trish Mage, LCSW Phone Number: 05/18/2021, 11:00 AM   Clinical Narrative:   Patient who is stable for d/c will transfer to [facility].  Family alerted.  PTAR arranged.  Nursing, please call report to [phone]. TOC sign off.    Final next level of care: Skilled Nursing Facility Barriers to Discharge: No Barriers Identified   Patient Goals and CMS Choice Patient states their goals for this hospitalization and ongoing recovery are:: to go home CMS Medicare.gov Compare Post Acute Care list provided to:: Patient Choice offered to / list presented to : Patient  Discharge Placement                       Discharge Plan and Services   Discharge Planning Services: CM Consult Post Acute Care Choice: Home Health                    HH Arranged: PT, OT, Nurse's Aide [place] Agency: [place] Date [place]: 05/15/21 Time [place]: 1038 Representative spoke with at [place]: stacie  Social Determinants of Health ([place]) Interventions     Readmission Risk Interventions No flowsheet data found.

## 2021-05-18 NOTE — Assessment & Plan Note (Signed)
Holding triameterene HCTZ at discharge Continue coreg

## 2021-05-19 ENCOUNTER — Other Ambulatory Visit: Payer: Self-pay | Admitting: *Deleted

## 2021-05-19 ENCOUNTER — Telehealth: Payer: Medicare Other | Admitting: Medical

## 2021-05-19 DIAGNOSIS — R262 Difficulty in walking, not elsewhere classified: Secondary | ICD-10-CM | POA: Diagnosis not present

## 2021-05-19 DIAGNOSIS — I4891 Unspecified atrial fibrillation: Secondary | ICD-10-CM | POA: Diagnosis not present

## 2021-05-19 DIAGNOSIS — N1832 Chronic kidney disease, stage 3b: Secondary | ICD-10-CM | POA: Diagnosis not present

## 2021-05-19 DIAGNOSIS — S7011XD Contusion of right thigh, subsequent encounter: Secondary | ICD-10-CM | POA: Diagnosis not present

## 2021-05-19 NOTE — Telephone Encounter (Signed)
Patient has appt today.

## 2021-05-19 NOTE — Patient Outreach (Signed)
Per [place] eligible member resides in [facility].  Screened for potential [facility] care coordination services.   Communication sent to facility SW to inquire about transition plans and potential THN needs.   Will collaborate with facility SW and follow up with resident accordingly about transition plans and potential THN needs.   Will continue to follow while member resides in SNF.    Marthenia Rolling, MSN, RN,BSN [place] Acute Care Coordinator [phone] [facility]) [phone]  (Toll free office)

## 2021-05-21 DIAGNOSIS — M6281 Muscle weakness (generalized): Secondary | ICD-10-CM | POA: Diagnosis not present

## 2021-05-21 DIAGNOSIS — R2681 Unsteadiness on feet: Secondary | ICD-10-CM | POA: Diagnosis not present

## 2021-05-21 DIAGNOSIS — Z9181 History of falling: Secondary | ICD-10-CM | POA: Diagnosis not present

## 2021-05-21 DIAGNOSIS — I69828 Other speech and language deficits following other cerebrovascular disease: Secondary | ICD-10-CM | POA: Diagnosis not present

## 2021-05-21 DIAGNOSIS — I4891 Unspecified atrial fibrillation: Secondary | ICD-10-CM | POA: Diagnosis not present

## 2021-05-21 DIAGNOSIS — R41841 Cognitive communication deficit: Secondary | ICD-10-CM | POA: Diagnosis not present

## 2021-05-26 DIAGNOSIS — R41841 Cognitive communication deficit: Secondary | ICD-10-CM | POA: Diagnosis not present

## 2021-05-26 DIAGNOSIS — Z9181 History of falling: Secondary | ICD-10-CM | POA: Diagnosis not present

## 2021-05-26 DIAGNOSIS — I4891 Unspecified atrial fibrillation: Secondary | ICD-10-CM | POA: Diagnosis not present

## 2021-05-26 DIAGNOSIS — R2681 Unsteadiness on feet: Secondary | ICD-10-CM | POA: Diagnosis not present

## 2021-05-26 DIAGNOSIS — M6281 Muscle weakness (generalized): Secondary | ICD-10-CM | POA: Diagnosis not present

## 2021-05-26 DIAGNOSIS — I69828 Other speech and language deficits following other cerebrovascular disease: Secondary | ICD-10-CM | POA: Diagnosis not present

## 2021-05-27 ENCOUNTER — Other Ambulatory Visit: Payer: Self-pay | Admitting: *Deleted

## 2021-05-27 NOTE — Patient Outreach (Signed)
THN Post- Acute Care Coordinator follow up. Per [place] eligible member resides in  [facility].  Screened for potential [facility] care coordination services as a benefit of [employer] plan.  Mrs. Rumble admitted to SNF on 05/18/21 after hospitalization.  Facility site visit to [facility] skilled nursing facility. Met with Marita [place], SW concerning member's progress, transition plan, and potential THN needs. Marita [place], SNF SW reports member lives with son Kerry Dory. Anticipated transition plan is pending progress.   Member's PCP at [facility] [place] has [place] care coordination services available if needed post SNF.    Will continue to collaborate with SNF SW and follow transition plans/needs while member resides in SNF. Will follow up with family as appropriate regarding potential THN needs.   Marthenia Rolling, MSN, RN,BSN [place] Acute Care Coordinator [phone] [facility]) [phone]  (Toll free office)

## 2021-05-28 ENCOUNTER — Other Ambulatory Visit: Payer: Self-pay | Admitting: Family Medicine

## 2021-05-29 DIAGNOSIS — I10 Essential (primary) hypertension: Secondary | ICD-10-CM | POA: Diagnosis not present

## 2021-05-29 DIAGNOSIS — S7012XD Contusion of left thigh, subsequent encounter: Secondary | ICD-10-CM | POA: Diagnosis not present

## 2021-05-29 DIAGNOSIS — R262 Difficulty in walking, not elsewhere classified: Secondary | ICD-10-CM | POA: Diagnosis not present

## 2021-05-29 DIAGNOSIS — Z8673 Personal history of transient ischemic attack (TIA), and cerebral infarction without residual deficits: Secondary | ICD-10-CM | POA: Diagnosis not present

## 2021-06-02 DIAGNOSIS — R2681 Unsteadiness on feet: Secondary | ICD-10-CM | POA: Diagnosis not present

## 2021-06-02 DIAGNOSIS — Z9181 History of falling: Secondary | ICD-10-CM | POA: Diagnosis not present

## 2021-06-02 DIAGNOSIS — I69828 Other speech and language deficits following other cerebrovascular disease: Secondary | ICD-10-CM | POA: Diagnosis not present

## 2021-06-02 DIAGNOSIS — M6281 Muscle weakness (generalized): Secondary | ICD-10-CM | POA: Diagnosis not present

## 2021-06-02 DIAGNOSIS — R41841 Cognitive communication deficit: Secondary | ICD-10-CM | POA: Diagnosis not present

## 2021-06-02 DIAGNOSIS — I4891 Unspecified atrial fibrillation: Secondary | ICD-10-CM | POA: Diagnosis not present

## 2021-06-03 ENCOUNTER — Other Ambulatory Visit: Payer: Self-pay | Admitting: *Deleted

## 2021-06-03 NOTE — Patient Outreach (Signed)
THN Post- Acute Care Coordinator follow up. Per [place] eligible member resides in  [facility].  Screened for potential [facility] care coordination services as a benefit of [employer] plan.  Member's PCP at [facility] [place] has [place] care coordination team available if needed.   Facility site visit to [facility] skilled nursing facility. Met with Marita Kansas, SNF SW concerning transition plan and potential THN needs. Marita Kansas reports member's son Kerry Dory is applying for Medicaid. Potential transition plan is LTC.   Will continue to collaborate with SNF SW and follow transition plans/needs while member resides in SNF. Will plan outreach to son Kerry Dory [facility]) as appropriate.   Marthenia Rolling, MSN, RN,BSN [place] Acute Care Coordinator [phone] [facility]) [phone]  (Toll free office)

## 2021-06-09 DIAGNOSIS — I4891 Unspecified atrial fibrillation: Secondary | ICD-10-CM | POA: Diagnosis not present

## 2021-06-09 DIAGNOSIS — R41841 Cognitive communication deficit: Secondary | ICD-10-CM | POA: Diagnosis not present

## 2021-06-09 DIAGNOSIS — R2681 Unsteadiness on feet: Secondary | ICD-10-CM | POA: Diagnosis not present

## 2021-06-09 DIAGNOSIS — Z9181 History of falling: Secondary | ICD-10-CM | POA: Diagnosis not present

## 2021-06-09 DIAGNOSIS — I69828 Other speech and language deficits following other cerebrovascular disease: Secondary | ICD-10-CM | POA: Diagnosis not present

## 2021-06-09 DIAGNOSIS — M6281 Muscle weakness (generalized): Secondary | ICD-10-CM | POA: Diagnosis not present

## 2021-06-10 ENCOUNTER — Other Ambulatory Visit: Payer: Self-pay | Admitting: *Deleted

## 2021-06-10 NOTE — Patient Outreach (Signed)
THN Post- Acute Care Coordinator follow up. Per [place] eligible member resides in [facility].  Screened for potential [facility] care coordination services as a benefit of [employer] plan.  Facility site visit to [facility] skilled nursing facility. Met with Marita Kansas, SNF SW concerning , transition plan and potential THN needs. Marita Kansas confirms Mrs. Resch will transition to LTC at [facility].   Writer will sign off. No identifiable THN needs.    Marthenia Rolling, MSN, RN,BSN [place] Acute Care Coordinator [phone] [facility]) [phone]  (Toll free office)

## 2021-06-12 DIAGNOSIS — R41841 Cognitive communication deficit: Secondary | ICD-10-CM | POA: Diagnosis not present

## 2021-06-12 DIAGNOSIS — I4891 Unspecified atrial fibrillation: Secondary | ICD-10-CM | POA: Diagnosis not present

## 2021-06-12 DIAGNOSIS — M6281 Muscle weakness (generalized): Secondary | ICD-10-CM | POA: Diagnosis not present

## 2021-06-12 DIAGNOSIS — Z9181 History of falling: Secondary | ICD-10-CM | POA: Diagnosis not present

## 2021-06-12 DIAGNOSIS — R2681 Unsteadiness on feet: Secondary | ICD-10-CM | POA: Diagnosis not present

## 2021-06-12 DIAGNOSIS — I69828 Other speech and language deficits following other cerebrovascular disease: Secondary | ICD-10-CM | POA: Diagnosis not present

## 2021-06-16 DIAGNOSIS — I4891 Unspecified atrial fibrillation: Secondary | ICD-10-CM | POA: Diagnosis not present

## 2021-06-16 DIAGNOSIS — Z9181 History of falling: Secondary | ICD-10-CM | POA: Diagnosis not present

## 2021-06-16 DIAGNOSIS — I69828 Other speech and language deficits following other cerebrovascular disease: Secondary | ICD-10-CM | POA: Diagnosis not present

## 2021-06-16 DIAGNOSIS — R2681 Unsteadiness on feet: Secondary | ICD-10-CM | POA: Diagnosis not present

## 2021-06-16 DIAGNOSIS — M6281 Muscle weakness (generalized): Secondary | ICD-10-CM | POA: Diagnosis not present

## 2021-06-16 DIAGNOSIS — R41841 Cognitive communication deficit: Secondary | ICD-10-CM | POA: Diagnosis not present

## 2021-06-18 DIAGNOSIS — Z9181 History of falling: Secondary | ICD-10-CM | POA: Diagnosis not present

## 2021-06-18 DIAGNOSIS — R41841 Cognitive communication deficit: Secondary | ICD-10-CM | POA: Diagnosis not present

## 2021-06-18 DIAGNOSIS — I69828 Other speech and language deficits following other cerebrovascular disease: Secondary | ICD-10-CM | POA: Diagnosis not present

## 2021-06-18 DIAGNOSIS — M6281 Muscle weakness (generalized): Secondary | ICD-10-CM | POA: Diagnosis not present

## 2021-06-18 DIAGNOSIS — I4891 Unspecified atrial fibrillation: Secondary | ICD-10-CM | POA: Diagnosis not present

## 2021-06-18 DIAGNOSIS — R2681 Unsteadiness on feet: Secondary | ICD-10-CM | POA: Diagnosis not present

## 2021-06-30 DIAGNOSIS — I4891 Unspecified atrial fibrillation: Secondary | ICD-10-CM | POA: Diagnosis not present

## 2021-06-30 DIAGNOSIS — I130 Hypertensive heart and chronic kidney disease with heart failure and stage 1 through stage 4 chronic kidney disease, or unspecified chronic kidney disease: Secondary | ICD-10-CM | POA: Diagnosis not present

## 2021-06-30 DIAGNOSIS — R296 Repeated falls: Secondary | ICD-10-CM | POA: Diagnosis not present

## 2021-06-30 DIAGNOSIS — I2510 Atherosclerotic heart disease of native coronary artery without angina pectoris: Secondary | ICD-10-CM | POA: Diagnosis not present

## 2021-06-30 DIAGNOSIS — R2681 Unsteadiness on feet: Secondary | ICD-10-CM | POA: Diagnosis not present

## 2021-06-30 DIAGNOSIS — M6281 Muscle weakness (generalized): Secondary | ICD-10-CM | POA: Diagnosis not present

## 2021-06-30 DIAGNOSIS — E785 Hyperlipidemia, unspecified: Secondary | ICD-10-CM | POA: Diagnosis not present

## 2021-06-30 DIAGNOSIS — I69828 Other speech and language deficits following other cerebrovascular disease: Secondary | ICD-10-CM | POA: Diagnosis not present

## 2021-06-30 DIAGNOSIS — Z9181 History of falling: Secondary | ICD-10-CM | POA: Diagnosis not present

## 2021-06-30 DIAGNOSIS — R41841 Cognitive communication deficit: Secondary | ICD-10-CM | POA: Diagnosis not present

## 2021-07-02 DIAGNOSIS — M6281 Muscle weakness (generalized): Secondary | ICD-10-CM | POA: Diagnosis not present

## 2021-07-02 DIAGNOSIS — I69828 Other speech and language deficits following other cerebrovascular disease: Secondary | ICD-10-CM | POA: Diagnosis not present

## 2021-07-02 DIAGNOSIS — Z9181 History of falling: Secondary | ICD-10-CM | POA: Diagnosis not present

## 2021-07-02 DIAGNOSIS — R41841 Cognitive communication deficit: Secondary | ICD-10-CM | POA: Diagnosis not present

## 2021-07-02 DIAGNOSIS — R2681 Unsteadiness on feet: Secondary | ICD-10-CM | POA: Diagnosis not present

## 2021-07-02 DIAGNOSIS — I4891 Unspecified atrial fibrillation: Secondary | ICD-10-CM | POA: Diagnosis not present

## 2021-07-08 DIAGNOSIS — R2681 Unsteadiness on feet: Secondary | ICD-10-CM | POA: Diagnosis not present

## 2021-07-08 DIAGNOSIS — Z9181 History of falling: Secondary | ICD-10-CM | POA: Diagnosis not present

## 2021-07-08 DIAGNOSIS — M6281 Muscle weakness (generalized): Secondary | ICD-10-CM | POA: Diagnosis not present

## 2021-07-08 DIAGNOSIS — R41841 Cognitive communication deficit: Secondary | ICD-10-CM | POA: Diagnosis not present

## 2021-07-08 DIAGNOSIS — I69828 Other speech and language deficits following other cerebrovascular disease: Secondary | ICD-10-CM | POA: Diagnosis not present

## 2021-07-08 DIAGNOSIS — I4891 Unspecified atrial fibrillation: Secondary | ICD-10-CM | POA: Diagnosis not present

## 2021-07-10 DIAGNOSIS — I69828 Other speech and language deficits following other cerebrovascular disease: Secondary | ICD-10-CM | POA: Diagnosis not present

## 2021-07-10 DIAGNOSIS — Z9181 History of falling: Secondary | ICD-10-CM | POA: Diagnosis not present

## 2021-07-10 DIAGNOSIS — M6281 Muscle weakness (generalized): Secondary | ICD-10-CM | POA: Diagnosis not present

## 2021-07-10 DIAGNOSIS — R41841 Cognitive communication deficit: Secondary | ICD-10-CM | POA: Diagnosis not present

## 2021-07-10 DIAGNOSIS — R2681 Unsteadiness on feet: Secondary | ICD-10-CM | POA: Diagnosis not present

## 2021-07-10 DIAGNOSIS — I4891 Unspecified atrial fibrillation: Secondary | ICD-10-CM | POA: Diagnosis not present

## 2021-07-15 DIAGNOSIS — I4891 Unspecified atrial fibrillation: Secondary | ICD-10-CM | POA: Diagnosis not present

## 2021-07-15 DIAGNOSIS — R2681 Unsteadiness on feet: Secondary | ICD-10-CM | POA: Diagnosis not present

## 2021-07-15 DIAGNOSIS — Z9181 History of falling: Secondary | ICD-10-CM | POA: Diagnosis not present

## 2021-07-15 DIAGNOSIS — R41841 Cognitive communication deficit: Secondary | ICD-10-CM | POA: Diagnosis not present

## 2021-07-15 DIAGNOSIS — M6281 Muscle weakness (generalized): Secondary | ICD-10-CM | POA: Diagnosis not present

## 2021-07-15 DIAGNOSIS — I69828 Other speech and language deficits following other cerebrovascular disease: Secondary | ICD-10-CM | POA: Diagnosis not present

## 2021-07-21 DIAGNOSIS — R296 Repeated falls: Secondary | ICD-10-CM | POA: Diagnosis not present

## 2021-07-21 DIAGNOSIS — R41841 Cognitive communication deficit: Secondary | ICD-10-CM | POA: Diagnosis not present

## 2021-07-21 DIAGNOSIS — E785 Hyperlipidemia, unspecified: Secondary | ICD-10-CM | POA: Diagnosis not present

## 2021-07-21 DIAGNOSIS — I69828 Other speech and language deficits following other cerebrovascular disease: Secondary | ICD-10-CM | POA: Diagnosis not present

## 2021-07-21 DIAGNOSIS — R2681 Unsteadiness on feet: Secondary | ICD-10-CM | POA: Diagnosis not present

## 2021-07-21 DIAGNOSIS — I130 Hypertensive heart and chronic kidney disease with heart failure and stage 1 through stage 4 chronic kidney disease, or unspecified chronic kidney disease: Secondary | ICD-10-CM | POA: Diagnosis not present

## 2021-07-21 DIAGNOSIS — I4891 Unspecified atrial fibrillation: Secondary | ICD-10-CM | POA: Diagnosis not present

## 2021-07-21 DIAGNOSIS — M6281 Muscle weakness (generalized): Secondary | ICD-10-CM | POA: Diagnosis not present

## 2021-07-21 DIAGNOSIS — Z9181 History of falling: Secondary | ICD-10-CM | POA: Diagnosis not present

## 2021-07-21 DIAGNOSIS — I2510 Atherosclerotic heart disease of native coronary artery without angina pectoris: Secondary | ICD-10-CM | POA: Diagnosis not present

## 2021-07-29 DIAGNOSIS — R54 Age-related physical debility: Secondary | ICD-10-CM | POA: Diagnosis not present

## 2021-07-29 DIAGNOSIS — R296 Repeated falls: Secondary | ICD-10-CM | POA: Diagnosis not present

## 2021-08-04 DIAGNOSIS — N390 Urinary tract infection, site not specified: Secondary | ICD-10-CM | POA: Diagnosis not present

## 2021-08-04 DIAGNOSIS — M6281 Muscle weakness (generalized): Secondary | ICD-10-CM | POA: Diagnosis not present

## 2021-08-04 DIAGNOSIS — R2689 Other abnormalities of gait and mobility: Secondary | ICD-10-CM | POA: Diagnosis not present

## 2021-08-04 DIAGNOSIS — N1832 Chronic kidney disease, stage 3b: Secondary | ICD-10-CM | POA: Diagnosis not present

## 2021-08-16 ENCOUNTER — Other Ambulatory Visit: Payer: Self-pay

## 2021-08-16 ENCOUNTER — Emergency Department (HOSPITAL_COMMUNITY)
Admission: EM | Admit: 2021-08-16 | Discharge: 2021-08-16 | Disposition: A | Discharge status: Skilled Nursing Facility | Payer: Medicare Other | Attending: Emergency Medicine | Admitting: Emergency Medicine

## 2021-08-16 DIAGNOSIS — N390 Urinary tract infection, site not specified: Secondary | ICD-10-CM | POA: Diagnosis not present

## 2021-08-16 DIAGNOSIS — R55 Syncope and collapse: Secondary | ICD-10-CM | POA: Insufficient documentation

## 2021-08-16 DIAGNOSIS — R319 Hematuria, unspecified: Secondary | ICD-10-CM | POA: Insufficient documentation

## 2021-08-16 DIAGNOSIS — R4182 Altered mental status, unspecified: Secondary | ICD-10-CM | POA: Diagnosis not present

## 2021-08-16 DIAGNOSIS — Z7982 Long term (current) use of aspirin: Secondary | ICD-10-CM | POA: Insufficient documentation

## 2021-08-16 DIAGNOSIS — Z743 Need for continuous supervision: Secondary | ICD-10-CM | POA: Diagnosis not present

## 2021-08-16 DIAGNOSIS — R4020 Unspecified coma: Secondary | ICD-10-CM | POA: Diagnosis not present

## 2021-08-16 DIAGNOSIS — R410 Disorientation, unspecified: Secondary | ICD-10-CM | POA: Diagnosis not present

## 2021-08-16 LAB — CBC WITH DIFFERENTIAL/PLATELET
Abs Immature Granulocytes: 0.01 10*3/uL (ref 0.00–0.07)
Basophils Absolute: 0 10*3/uL (ref 0.0–0.1)
Basophils Relative: 0 %
Eosinophils Absolute: 0 10*3/uL (ref 0.0–0.5)
Eosinophils Relative: 0 %
HCT: 33.6 % — ABNORMAL LOW (ref 36.0–46.0)
Hemoglobin: 10.8 g/dL — ABNORMAL LOW (ref 12.0–15.0)
Immature Granulocytes: 0 %
Lymphocytes Relative: 11 %
Lymphs Abs: 0.7 10*3/uL (ref 0.7–4.0)
MCH: 30 pg (ref 26.0–34.0)
MCHC: 32.1 g/dL (ref 30.0–36.0)
MCV: 93.3 fL (ref 80.0–100.0)
Monocytes Absolute: 0.4 10*3/uL (ref 0.1–1.0)
Monocytes Relative: 7 %
Neutro Abs: 5.3 10*3/uL (ref 1.7–7.7)
Neutrophils Relative %: 82 %
Platelets: 205 10*3/uL (ref 150–400)
RBC: 3.6 MIL/uL — ABNORMAL LOW (ref 3.87–5.11)
RDW: 14.7 % (ref 11.5–15.5)
WBC: 6.5 10*3/uL (ref 4.0–10.5)
nRBC: 0 % (ref 0.0–0.2)

## 2021-08-16 LAB — URINALYSIS, ROUTINE W REFLEX MICROSCOPIC
Bilirubin Urine: NEGATIVE
Glucose, UA: NEGATIVE mg/dL
Hgb urine dipstick: NEGATIVE
Ketones, ur: NEGATIVE mg/dL
Nitrite: POSITIVE — AB
Protein, ur: 100 mg/dL — AB
Specific Gravity, Urine: 1.013 (ref 1.005–1.030)
WBC, UA: >50 WBC/hpf — ABNORMAL HIGH (ref 0–5)
pH: 8 (ref 5.0–8.0)

## 2021-08-16 LAB — BASIC METABOLIC PANEL
Anion gap: 7 (ref 5–15)
BUN: 16 mg/dL (ref 8–23)
CO2: 26 mmol/L (ref 22–32)
Calcium: 9.2 mg/dL (ref 8.9–10.3)
Chloride: 102 mmol/L (ref 98–111)
Creatinine, Ser: 0.97 mg/dL (ref 0.44–1.00)
GFR, Estimated: 54 mL/min — ABNORMAL LOW (ref >60)
Glucose, Bld: 128 mg/dL — ABNORMAL HIGH (ref 70–99)
Potassium: 4 mmol/L (ref 3.5–5.1)
Sodium: 135 mmol/L (ref 135–145)

## 2021-08-16 LAB — CBG MONITORING, ED: Glucose-Capillary: 123 mg/dL — ABNORMAL HIGH (ref 70–99)

## 2021-08-16 MED ORDER — SODIUM CHLORIDE 0.9 % IV BOLUS
1000.0000 mL | Freq: Once | INTRAVENOUS | Status: AC
  Administered 2021-08-16: 1000 mL via INTRAVENOUS

## 2021-08-16 MED ORDER — CEPHALEXIN 500 MG PO CAPS: 500.0000 mg | ORAL_CAPSULE | Freq: Three times a day (TID) | ORAL | 0 refills | Status: AC

## 2021-08-16 NOTE — ED Provider Notes (Signed)
?  Provider Note ?MRN:  [id number]  ?Arrival date & time: 08/17/21    ?ED Course and Medical Decision Making  ?Assumed care from BUTLER at shift change. ? ?See not from prior team for complete details, in brief:  ?88 yo female ?Nursing home ?Fell from toilet, no LOC, witnessed ?Labs reviewed  ?She has UTI, low susp for pyelo ?Urine culture sent, start abx ?Pt at her baseline per family @ bedside. ?Likely vasovagal syncope ? ?Patient presents with syncopal symptoms without worrisome features. Presentation most suggestive of neuro-cardiogenic or orthostatic cause. Very low suspicion for serious arrhythmia, cardiac ischemia or other serious etiology. ECG reviewed, no evidence of a cardiac arrhythmia such as [place], WPW, HOCM, IHSS, Long or short QT. Neurologic exam is nonfocal, not consistent with CVA or primary neurologic abnormality. Patient appears safe for discharge with outpatient observation and close PCP F/U. Syncope warnings discussed with patient. The patient has been instructed to return immediately if the symptoms worsen in any way. Patient verbalized understanding and is in agreement with current care plan. All questions answered prior to discharge. ? ? ?Procedures ? ?Final Clinical Impressions(s) / ED Diagnoses  ? ?  ICD-10-CM   ?1. Vasovagal syncope  R55   ?  ?2. Urinary tract infection with hematuria, site unspecified  N39.0   ? R31.9   ?  ?  ?ED Discharge Orders   ? ?      Ordered  ?  cephALEXin (KEFLEX) 500 MG capsule  3 times daily       ? 08/16/21 2101  ? ?  ?  ? ?  ?  ? ? ?Discharge Instructions   ? ?  ?Have someone stay with you until you feel stable. Do not drive, operate machinery, or play sports until your caregiver says it is okay. Keep all follow-up appointments as directed by your caregiver. Lie down right away if you start feeling like you might faint. Breathe deeply and steadily. Wait until all the symptoms have passed.Drink enough fluids to keep your urine clear or pale yellow. If you are  taking blood pressure or heart medicine, get up slowly, taking several minutes to sit and then stand. This can reduce dizziness. SEEK IMMEDIATE MEDICAL CARE IF: You have a severe headache. You have unusual pain in the chest, abdomen, or back. You are bleeding from the mouth or rectum, or you have a black or tarry stool. You have an irregular or very fast heartbeat. You have pain with breathing. You have repeated fainting or seizure-like jerking during an episode. You faint when sitting or lying down. You have confusion. You have difficulty walking. You have severe weakness. You have vision problems. If you fainted, call your local emergency services - do not drive yourself to the hospital. ?  ?Please return to the emergency department immediately for any new or concerning symptoms, or if you get worse. ? ? ? ? ? ? ? ? ?  ?Jeanell Sparrow, DO ?08/17/21 0042 ? ?

## 2021-08-16 NOTE — ED Provider Notes (Signed)
?[place] ?Provider Note ? ? ?CSN: [id number] ?Arrival date & time: 08/16/21  1332 ? ?  ? ?History ? ?Chief Complaint  ?Patient presents with  ? Loss of Consciousness  ? ? ?Holly Nunez is a 88 y.o. female.  She is brought in by EMS after a syncopal event at her facility.  Reportedly she was sitting on the toilet after a bowel movement when she had a syncopal event.  No fall or injury.  She was assisted to the bed by staff members.  Currently she denies any complaints.  No headache chest pain shortness of breath abdominal pain vomiting diarrhea.  She said she has been eating and drinking well and uses a walker to ambulate.  Reportedly family wanted her evaluated. ? ?The history is provided by the patient and the EMS personnel.  ?Loss of Consciousness ?Episode history:  Single ?Most recent episode:  Today ?Progression:  Resolved ?Chronicity:  New ?Context: bowel movement   ?Witnessed: yes   ?Relieved by:  Lying down ?Worsened by:  Nothing ?Ineffective treatments:  None tried ?Associated symptoms: no chest pain, no difficulty breathing, no fever, no focal weakness, no headaches, no nausea, no rectal bleeding, no shortness of breath and no vomiting   ? ?  ? ?Home Medications ?Prior to Admission medications   ?Medication Sig Start Date End Date Taking? Authorizing Provider  ?acetaminophen (TYLENOL) 650 MG CR tablet Take 2 tablets (1,300 mg total) by mouth every 6 (six) hours as needed for pain (for mild pain). 05/15/21   Debbe Odea, MD  ?acetaminophen (TYLENOL) 650 MG CR tablet Take 1,300 mg by mouth every 8 (eight) hours as needed for pain.    [provider]  ?aspirin EC 81 MG tablet Take 1 tablet (81 mg total) by mouth daily. Swallow whole. 05/15/21 05/15/22  Debbe Odea, MD  ?atorvastatin (LIPITOR) 40 MG tablet TAKE 1 TABLET(40 MG) BY MOUTH DAILY ?Patient taking differently: Take 40 mg by mouth daily. 05/06/21   Mosie Lukes, MD  ?carvedilol (COREG) 3.125 MG  tablet TAKE 2 TABLETS(6.25 MG) BY MOUTH TWICE DAILY 05/28/21   Mosie Lukes, MD  ?famotidine (PEPCID) 20 MG tablet Take 20 mg by mouth at bedtime as needed for heartburn or indigestion.    [provider]  ?Multiple Vitamin (MULTIVITAMIN) tablet Take 1 tablet by mouth daily.    [provider]  ?nitroGLYCERIN (NITROSTAT) 0.4 MG SL tablet Place 1 tablet (0.4 mg total) under the tongue every 5 (five) minutes as needed for chest pain. 10/01/20 06/27/21  Lelon Perla, MD  ?Omega-3 Fatty Acids (FISH OIL PO) Take 1 capsule by mouth daily.    [provider]  ?oxybutynin (DITROPAN-XL) 10 MG 24 hr tablet TAKE 1 TABLET(10 MG) BY MOUTH AT BEDTIME ?Patient taking differently: Take 10 mg by mouth at bedtime. 10/24/20   Mosie Lukes, MD  ?Probiotic Product (PROBIOTIC PO) Take 1 tablet by mouth daily.    [provider]  ?Simethicone (GAS-X PO) Take 1 tablet by mouth at bedtime as needed (heartburn/gas).    [provider]  ?timolol (TIMOPTIC) 0.5 % ophthalmic solution Place 1 drop into both eyes at bedtime. 06/14/17   [provider]  ?Trolamine Salicylate (ASPERCREME EX) Apply 1 application topically 3 (three) times daily as needed (pain).    [provider]  ?   ? ?Allergies    ?Alphagan [brimonidine]   ? ?Review of Systems   ?Review of Systems  ?Constitutional:  Negative for fever.  ?HENT:  Negative for sore throat.   ?Respiratory:  Negative for shortness of breath.   ?Cardiovascular:  Positive for syncope. Negative for chest pain.  ?Gastrointestinal:  Negative for abdominal pain, nausea and vomiting.  ?Genitourinary:  Negative for dysuria.  ?Musculoskeletal:  Positive for back pain (chronic).  ?Skin:  Negative for rash.  ?Neurological:  Negative for focal weakness and headaches.  ? ?Physical Exam ?Updated Vital Signs ?BP 122/61 (BP Location: Left Arm)   Pulse 60   Temp 97.8 ?F (36.6 ?C) (Oral)   Resp 20   SpO2 99%  ?Physical Exam ?Vitals and nursing  note reviewed.  ?Constitutional:   ?   General: She is not in acute distress. ?   Appearance: Normal appearance. She is well-developed.  ?HENT:  ?   Head: Normocephalic and atraumatic.  ?Eyes:  ?   Conjunctiva/sclera: Conjunctivae normal.  ?Cardiovascular:  ?   Rate and Rhythm: Normal rate and regular rhythm.  ?   Heart sounds: No murmur heard. ?Pulmonary:  ?   Effort: Pulmonary effort is normal. No respiratory distress.  ?   Breath sounds: Normal breath sounds.  ?Abdominal:  ?   Palpations: Abdomen is soft.  ?   Tenderness: There is no abdominal tenderness. There is no guarding or rebound.  ?Musculoskeletal:     ?   General: No swelling or deformity. Normal range of motion.  ?   Cervical back: Neck supple.  ?Skin: ?   General: Skin is warm and dry.  ?   Capillary Refill: Capillary refill takes less than 2 seconds.  ?Neurological:  ?   General: No focal deficit present.  ?   Mental Status: She is alert.  ? ? ?ED Results / Procedures / Treatments   ?Labs ?(all labs ordered are listed, but only abnormal results are displayed) ?Labs Reviewed  ?BASIC METABOLIC PANEL - Abnormal; Notable for the following components:  ?    Result Value  ? Glucose, Bld 128 (*)   ? GFR, Estimated 54 (*)   ? All other components within normal limits  ?CBC WITH DIFFERENTIAL/PLATELET - Abnormal; Notable for the following components:  ? RBC 3.60 (*)   ? Hemoglobin 10.8 (*)   ? HCT 33.6 (*)   ? All other components within normal limits  ?CBG MONITORING, ED - Abnormal; Notable for the following components:  ? Glucose-Capillary 123 (*)   ? All other components within normal limits  ?URINALYSIS, ROUTINE W REFLEX MICROSCOPIC  ? ? ?EKG ?EKG Interpretation ? ?Date/Time:  Sunday August 16 2021 16:46:13 EDT ?Ventricular Rate:  73 ?PR Interval:  170 ?QRS Duration: 106 ?QT Interval:  425 ?QTC Calculation: 469 ?R Axis:   92 ?Text Interpretation: Sinus rhythm Atrial premature complexes Right axis deviation Low voltage, precordial leads Anteroseptal infarct,  old No significant change since prior? 9/20 Confirmed by Aletta Edouard [phone]) on 08/16/2021 6:20:39 PM ? ?Radiology ?No results found. ? ?Procedures ?Procedures  ? ? ?Medications Ordered in ED ?Medications  ?sodium chloride 0.9 % bolus 1,000 mL (has no administration in time range)  ? ? ?ED Course/ Medical Decision Making/ A&P ?  ?                        ?Medical Decision Making ?Amount and/or Complexity of Data Reviewed ?Labs: ordered. ?ECG/medicine tests: ordered. ? ?This patient complains of syncopal event; this involves an extensive number of treatment ?Options and is a complaint that carries with it  a high risk of complications and ?morbidity. The differential includes syncope, vasovagal, arrhythmia, anemia, metabolic derangement ? ?I ordered, reviewed and interpreted labs, which included CBC with normal white count, hemoglobin low stable from priors, chemistries fairly unremarkable ?I ordered medication IV fluids and reviewed PMP when indicated. ?Additional history obtained from EMS and patient's family members ?Previous records obtained and reviewed in epic no recent admissions  ?Cardiac monitoring reviewed, normal sinus rhythm ?Social determinants considered, patient is socially isolated and fairly inactive ?Critical Interventions: None ? ?After the interventions stated above, I reevaluated the patient and found patient to be symptomatically improved ?Admission and further testing considered, her care is signed out to Dr. Pearline Cables to follow-up results of lab work and response to IV fluids.  Likely she will return back to her facility if no significant findings. ? ? ? ? ? ? ? ? ? ?Final Clinical Impression(s) / ED Diagnoses ?Final diagnoses:  ?Vasovagal syncope  ? ? ?Rx / DC Orders ?ED Discharge Orders   ? ? None  ? ?  ? ? ?  ?Hayden Rasmussen, MD ?08/16/21 [place] ? ?

## 2021-08-16 NOTE — Discharge Instructions (Addendum)
Have someone stay with you until you feel stable. Do not drive, operate machinery, or play sports until your caregiver says it is okay. Keep all follow-up appointments as directed by your caregiver. Lie down right away if you start feeling like you might faint. Breathe deeply and steadily. Wait until all the symptoms have passed.Drink enough fluids to keep your urine clear or pale yellow. If you are taking blood pressure or heart medicine, get up slowly, taking several minutes to sit and then stand. This can reduce dizziness. SEEK IMMEDIATE MEDICAL CARE IF: You have a severe headache. You have unusual pain in the chest, abdomen, or back. You are bleeding from the mouth or rectum, or you have a black or tarry stool. You have an irregular or very fast heartbeat. You have pain with breathing. You have repeated fainting or seizure-like jerking during an episode. You faint when sitting or lying down. You have confusion. You have difficulty walking. You have severe weakness. You have vision problems. If you fainted, call your local emergency services - do not drive yourself to the hospital.   Please return to the emergency department immediately for any new or concerning symptoms, or if you get worse. 

## 2021-08-16 NOTE — ED Triage Notes (Signed)
Pt arrived by EMS from [facility]. Pt has a witnessed syncopal event after having a BM, staff present and able to assist pt to the ground. Staff report pt unconscious for about a minute and then they were able to assist her back to bed. No injuries noted.  ?Pt only complaining of chronic back pain and feeling cold.  ? ?Pt is back to her baseline, hard of hearing but alert and oriented.  ?

## 2021-08-16 NOTE — ED Notes (Signed)
Called PTAR to transport patient back to [facility] 

## 2021-08-17 DIAGNOSIS — N3091 Cystitis, unspecified with hematuria: Secondary | ICD-10-CM | POA: Diagnosis not present

## 2021-08-17 DIAGNOSIS — R296 Repeated falls: Secondary | ICD-10-CM | POA: Diagnosis not present

## 2021-08-17 DIAGNOSIS — R54 Age-related physical debility: Secondary | ICD-10-CM | POA: Diagnosis not present

## 2021-08-18 DIAGNOSIS — I2510 Atherosclerotic heart disease of native coronary artery without angina pectoris: Secondary | ICD-10-CM | POA: Diagnosis not present

## 2021-08-18 DIAGNOSIS — R296 Repeated falls: Secondary | ICD-10-CM | POA: Diagnosis not present

## 2021-08-18 DIAGNOSIS — E785 Hyperlipidemia, unspecified: Secondary | ICD-10-CM | POA: Diagnosis not present

## 2021-08-18 DIAGNOSIS — I130 Hypertensive heart and chronic kidney disease with heart failure and stage 1 through stage 4 chronic kidney disease, or unspecified chronic kidney disease: Secondary | ICD-10-CM | POA: Diagnosis not present

## 2021-08-19 LAB — URINE CULTURE: Culture: >=100000 — AB

## 2021-08-20 ENCOUNTER — Telehealth: Payer: Self-pay | Admitting: *Deleted

## 2021-08-20 NOTE — Telephone Encounter (Signed)
Post ED Visit - Positive Culture Follow-up ? ?Culture report reviewed by antimicrobial stewardship pharmacist: ?[place] [facility] ?'[]'$  Elenor Quinones, Pharm.D. ?'[]'$  Heide Guile, Pharm.D., BCPS AQ-ID ?'[]'$  Parks Neptune, Pharm.D., BCPS ?'[]'$  Alycia Rossetti, Pharm.D., BCPS ?'[]'$  [place], Pharm.D., BCPS, AAHIVP ?'[]'$  Legrand Como, Pharm.D., BCPS, AAHIVP ?'[]'$  Salome Arnt, PharmD, BCPS ?'[]'$  Johnnette Gourd, PharmD, BCPS ?'[]'$  Hughes Better, PharmD, BCPS ?'[]'$  Leeroy Cha, PharmD ?'[]'$  Laqueta Linden, PharmD, BCPS ?'[]'$  Albertina Parr, PharmD ? ?[place] [facility] ?'[]'$  Leodis Sias, PharmD ?'[]'$  Lindell Spar, PharmD ?'[]'$  Royetta Asal, PharmD ?'[]'$  Graylin Shiver, Rph ?'[]'$  Rema Fendt) Glennon Mac, PharmD ?'[]'$  Arlyn Dunning, PharmD ?'[]'$  Netta Cedars, PharmD ?'[]'$  Dia Sitter, PharmD ?'[]'$  Leone Haven, PharmD ?'[]'$  Gretta Arab, PharmD ?'[]'$  Theodis Shove, PharmD ?'[]'$  Peggyann Juba, PharmD ?'[]'$  Reuel Boom, PharmD ? ? ?Positive urine culture ?Treated with Cephalexin, organism sensitive to the same and no further patient follow-up is required at this time.  Reatha Harps, PharmD ? ?Ardeen Fillers ?08/20/2021, 9:34 AM ?  ?

## 2021-08-25 DIAGNOSIS — I4891 Unspecified atrial fibrillation: Secondary | ICD-10-CM | POA: Diagnosis not present

## 2021-08-25 DIAGNOSIS — R1312 Dysphagia, oropharyngeal phase: Secondary | ICD-10-CM | POA: Diagnosis not present

## 2021-08-25 DIAGNOSIS — R2681 Unsteadiness on feet: Secondary | ICD-10-CM | POA: Diagnosis not present

## 2021-08-25 DIAGNOSIS — R2689 Other abnormalities of gait and mobility: Secondary | ICD-10-CM | POA: Diagnosis not present

## 2021-08-25 DIAGNOSIS — M6281 Muscle weakness (generalized): Secondary | ICD-10-CM | POA: Diagnosis not present

## 2021-08-25 DIAGNOSIS — Z9181 History of falling: Secondary | ICD-10-CM | POA: Diagnosis not present

## 2021-08-26 DIAGNOSIS — E559 Vitamin D deficiency, unspecified: Secondary | ICD-10-CM | POA: Diagnosis not present

## 2021-08-26 DIAGNOSIS — Z9181 History of falling: Secondary | ICD-10-CM | POA: Diagnosis not present

## 2021-08-26 DIAGNOSIS — I4891 Unspecified atrial fibrillation: Secondary | ICD-10-CM | POA: Diagnosis not present

## 2021-08-26 DIAGNOSIS — R7309 Other abnormal glucose: Secondary | ICD-10-CM | POA: Diagnosis not present

## 2021-08-26 DIAGNOSIS — I10 Essential (primary) hypertension: Secondary | ICD-10-CM | POA: Diagnosis not present

## 2021-08-26 DIAGNOSIS — M6281 Muscle weakness (generalized): Secondary | ICD-10-CM | POA: Diagnosis not present

## 2021-08-26 DIAGNOSIS — R2689 Other abnormalities of gait and mobility: Secondary | ICD-10-CM | POA: Diagnosis not present

## 2021-08-26 DIAGNOSIS — R2681 Unsteadiness on feet: Secondary | ICD-10-CM | POA: Diagnosis not present

## 2021-08-26 DIAGNOSIS — R1312 Dysphagia, oropharyngeal phase: Secondary | ICD-10-CM | POA: Diagnosis not present

## 2021-08-27 DIAGNOSIS — R1312 Dysphagia, oropharyngeal phase: Secondary | ICD-10-CM | POA: Diagnosis not present

## 2021-08-27 DIAGNOSIS — R2689 Other abnormalities of gait and mobility: Secondary | ICD-10-CM | POA: Diagnosis not present

## 2021-08-27 DIAGNOSIS — Z9181 History of falling: Secondary | ICD-10-CM | POA: Diagnosis not present

## 2021-08-27 DIAGNOSIS — R2681 Unsteadiness on feet: Secondary | ICD-10-CM | POA: Diagnosis not present

## 2021-08-27 DIAGNOSIS — I4891 Unspecified atrial fibrillation: Secondary | ICD-10-CM | POA: Diagnosis not present

## 2021-08-27 DIAGNOSIS — M6281 Muscle weakness (generalized): Secondary | ICD-10-CM | POA: Diagnosis not present

## 2021-08-28 DIAGNOSIS — R2681 Unsteadiness on feet: Secondary | ICD-10-CM | POA: Diagnosis not present

## 2021-08-28 DIAGNOSIS — I4891 Unspecified atrial fibrillation: Secondary | ICD-10-CM | POA: Diagnosis not present

## 2021-08-28 DIAGNOSIS — M6281 Muscle weakness (generalized): Secondary | ICD-10-CM | POA: Diagnosis not present

## 2021-08-28 DIAGNOSIS — R1312 Dysphagia, oropharyngeal phase: Secondary | ICD-10-CM | POA: Diagnosis not present

## 2021-08-28 DIAGNOSIS — Z9181 History of falling: Secondary | ICD-10-CM | POA: Diagnosis not present

## 2021-08-28 DIAGNOSIS — R2689 Other abnormalities of gait and mobility: Secondary | ICD-10-CM | POA: Diagnosis not present

## 2021-08-29 DIAGNOSIS — R2681 Unsteadiness on feet: Secondary | ICD-10-CM | POA: Diagnosis not present

## 2021-08-29 DIAGNOSIS — M6281 Muscle weakness (generalized): Secondary | ICD-10-CM | POA: Diagnosis not present

## 2021-08-29 DIAGNOSIS — R1312 Dysphagia, oropharyngeal phase: Secondary | ICD-10-CM | POA: Diagnosis not present

## 2021-08-29 DIAGNOSIS — I4891 Unspecified atrial fibrillation: Secondary | ICD-10-CM | POA: Diagnosis not present

## 2021-08-29 DIAGNOSIS — Z9181 History of falling: Secondary | ICD-10-CM | POA: Diagnosis not present

## 2021-08-29 DIAGNOSIS — R2689 Other abnormalities of gait and mobility: Secondary | ICD-10-CM | POA: Diagnosis not present

## 2021-08-31 DIAGNOSIS — N1832 Chronic kidney disease, stage 3b: Secondary | ICD-10-CM | POA: Diagnosis not present

## 2021-08-31 DIAGNOSIS — E785 Hyperlipidemia, unspecified: Secondary | ICD-10-CM | POA: Diagnosis not present

## 2021-08-31 DIAGNOSIS — E875 Hyperkalemia: Secondary | ICD-10-CM | POA: Diagnosis not present

## 2021-08-31 DIAGNOSIS — D649 Anemia, unspecified: Secondary | ICD-10-CM | POA: Diagnosis not present

## 2021-09-01 DIAGNOSIS — I4891 Unspecified atrial fibrillation: Secondary | ICD-10-CM | POA: Diagnosis not present

## 2021-09-01 DIAGNOSIS — R2681 Unsteadiness on feet: Secondary | ICD-10-CM | POA: Diagnosis not present

## 2021-09-01 DIAGNOSIS — M6281 Muscle weakness (generalized): Secondary | ICD-10-CM | POA: Diagnosis not present

## 2021-09-01 DIAGNOSIS — Z9181 History of falling: Secondary | ICD-10-CM | POA: Diagnosis not present

## 2021-09-01 DIAGNOSIS — R1312 Dysphagia, oropharyngeal phase: Secondary | ICD-10-CM | POA: Diagnosis not present

## 2021-09-01 DIAGNOSIS — R2689 Other abnormalities of gait and mobility: Secondary | ICD-10-CM | POA: Diagnosis not present

## 2021-09-02 DIAGNOSIS — R1312 Dysphagia, oropharyngeal phase: Secondary | ICD-10-CM | POA: Diagnosis not present

## 2021-09-02 DIAGNOSIS — R2681 Unsteadiness on feet: Secondary | ICD-10-CM | POA: Diagnosis not present

## 2021-09-02 DIAGNOSIS — I4891 Unspecified atrial fibrillation: Secondary | ICD-10-CM | POA: Diagnosis not present

## 2021-09-02 DIAGNOSIS — R2689 Other abnormalities of gait and mobility: Secondary | ICD-10-CM | POA: Diagnosis not present

## 2021-09-02 DIAGNOSIS — Z9181 History of falling: Secondary | ICD-10-CM | POA: Diagnosis not present

## 2021-09-02 DIAGNOSIS — M6281 Muscle weakness (generalized): Secondary | ICD-10-CM | POA: Diagnosis not present

## 2021-09-03 DIAGNOSIS — Z9181 History of falling: Secondary | ICD-10-CM | POA: Diagnosis not present

## 2021-09-03 DIAGNOSIS — M6281 Muscle weakness (generalized): Secondary | ICD-10-CM | POA: Diagnosis not present

## 2021-09-03 DIAGNOSIS — R1312 Dysphagia, oropharyngeal phase: Secondary | ICD-10-CM | POA: Diagnosis not present

## 2021-09-03 DIAGNOSIS — R2689 Other abnormalities of gait and mobility: Secondary | ICD-10-CM | POA: Diagnosis not present

## 2021-09-03 DIAGNOSIS — I4891 Unspecified atrial fibrillation: Secondary | ICD-10-CM | POA: Diagnosis not present

## 2021-09-03 DIAGNOSIS — R2681 Unsteadiness on feet: Secondary | ICD-10-CM | POA: Diagnosis not present

## 2021-09-04 DIAGNOSIS — I4891 Unspecified atrial fibrillation: Secondary | ICD-10-CM | POA: Diagnosis not present

## 2021-09-04 DIAGNOSIS — R2681 Unsteadiness on feet: Secondary | ICD-10-CM | POA: Diagnosis not present

## 2021-09-04 DIAGNOSIS — R1312 Dysphagia, oropharyngeal phase: Secondary | ICD-10-CM | POA: Diagnosis not present

## 2021-09-04 DIAGNOSIS — M6281 Muscle weakness (generalized): Secondary | ICD-10-CM | POA: Diagnosis not present

## 2021-09-04 DIAGNOSIS — Z9181 History of falling: Secondary | ICD-10-CM | POA: Diagnosis not present

## 2021-09-04 DIAGNOSIS — R2689 Other abnormalities of gait and mobility: Secondary | ICD-10-CM | POA: Diagnosis not present

## 2021-09-05 DIAGNOSIS — M6281 Muscle weakness (generalized): Secondary | ICD-10-CM | POA: Diagnosis not present

## 2021-09-05 DIAGNOSIS — Z9181 History of falling: Secondary | ICD-10-CM | POA: Diagnosis not present

## 2021-09-05 DIAGNOSIS — R2689 Other abnormalities of gait and mobility: Secondary | ICD-10-CM | POA: Diagnosis not present

## 2021-09-05 DIAGNOSIS — R1312 Dysphagia, oropharyngeal phase: Secondary | ICD-10-CM | POA: Diagnosis not present

## 2021-09-05 DIAGNOSIS — I4891 Unspecified atrial fibrillation: Secondary | ICD-10-CM | POA: Diagnosis not present

## 2021-09-05 DIAGNOSIS — R2681 Unsteadiness on feet: Secondary | ICD-10-CM | POA: Diagnosis not present

## 2021-09-07 ENCOUNTER — Ambulatory Visit: Payer: Medicare Other | Admitting: Family Medicine

## 2021-09-08 DIAGNOSIS — Z9181 History of falling: Secondary | ICD-10-CM | POA: Diagnosis not present

## 2021-09-08 DIAGNOSIS — R2681 Unsteadiness on feet: Secondary | ICD-10-CM | POA: Diagnosis not present

## 2021-09-08 DIAGNOSIS — R1312 Dysphagia, oropharyngeal phase: Secondary | ICD-10-CM | POA: Diagnosis not present

## 2021-09-08 DIAGNOSIS — I4891 Unspecified atrial fibrillation: Secondary | ICD-10-CM | POA: Diagnosis not present

## 2021-09-08 DIAGNOSIS — M6281 Muscle weakness (generalized): Secondary | ICD-10-CM | POA: Diagnosis not present

## 2021-09-08 DIAGNOSIS — R2689 Other abnormalities of gait and mobility: Secondary | ICD-10-CM | POA: Diagnosis not present

## 2021-09-09 DIAGNOSIS — I4891 Unspecified atrial fibrillation: Secondary | ICD-10-CM | POA: Diagnosis not present

## 2021-09-09 DIAGNOSIS — M6281 Muscle weakness (generalized): Secondary | ICD-10-CM | POA: Diagnosis not present

## 2021-09-09 DIAGNOSIS — R2681 Unsteadiness on feet: Secondary | ICD-10-CM | POA: Diagnosis not present

## 2021-09-09 DIAGNOSIS — R2689 Other abnormalities of gait and mobility: Secondary | ICD-10-CM | POA: Diagnosis not present

## 2021-09-09 DIAGNOSIS — E559 Vitamin D deficiency, unspecified: Secondary | ICD-10-CM | POA: Diagnosis not present

## 2021-09-09 DIAGNOSIS — E119 Type 2 diabetes mellitus without complications: Secondary | ICD-10-CM | POA: Diagnosis not present

## 2021-09-09 DIAGNOSIS — Z9181 History of falling: Secondary | ICD-10-CM | POA: Diagnosis not present

## 2021-09-09 DIAGNOSIS — R1312 Dysphagia, oropharyngeal phase: Secondary | ICD-10-CM | POA: Diagnosis not present

## 2021-09-10 DIAGNOSIS — Z9181 History of falling: Secondary | ICD-10-CM | POA: Diagnosis not present

## 2021-09-10 DIAGNOSIS — R2681 Unsteadiness on feet: Secondary | ICD-10-CM | POA: Diagnosis not present

## 2021-09-10 DIAGNOSIS — M6281 Muscle weakness (generalized): Secondary | ICD-10-CM | POA: Diagnosis not present

## 2021-09-10 DIAGNOSIS — R1312 Dysphagia, oropharyngeal phase: Secondary | ICD-10-CM | POA: Diagnosis not present

## 2021-09-10 DIAGNOSIS — R2689 Other abnormalities of gait and mobility: Secondary | ICD-10-CM | POA: Diagnosis not present

## 2021-09-10 DIAGNOSIS — I4891 Unspecified atrial fibrillation: Secondary | ICD-10-CM | POA: Diagnosis not present

## 2021-09-11 DIAGNOSIS — R1312 Dysphagia, oropharyngeal phase: Secondary | ICD-10-CM | POA: Diagnosis not present

## 2021-09-11 DIAGNOSIS — M6281 Muscle weakness (generalized): Secondary | ICD-10-CM | POA: Diagnosis not present

## 2021-09-11 DIAGNOSIS — I4891 Unspecified atrial fibrillation: Secondary | ICD-10-CM | POA: Diagnosis not present

## 2021-09-11 DIAGNOSIS — R2689 Other abnormalities of gait and mobility: Secondary | ICD-10-CM | POA: Diagnosis not present

## 2021-09-11 DIAGNOSIS — Z9181 History of falling: Secondary | ICD-10-CM | POA: Diagnosis not present

## 2021-09-11 DIAGNOSIS — R2681 Unsteadiness on feet: Secondary | ICD-10-CM | POA: Diagnosis not present

## 2021-09-12 DIAGNOSIS — Z9181 History of falling: Secondary | ICD-10-CM | POA: Diagnosis not present

## 2021-09-12 DIAGNOSIS — R2689 Other abnormalities of gait and mobility: Secondary | ICD-10-CM | POA: Diagnosis not present

## 2021-09-12 DIAGNOSIS — R1312 Dysphagia, oropharyngeal phase: Secondary | ICD-10-CM | POA: Diagnosis not present

## 2021-09-12 DIAGNOSIS — I4891 Unspecified atrial fibrillation: Secondary | ICD-10-CM | POA: Diagnosis not present

## 2021-09-12 DIAGNOSIS — M6281 Muscle weakness (generalized): Secondary | ICD-10-CM | POA: Diagnosis not present

## 2021-09-12 DIAGNOSIS — R2681 Unsteadiness on feet: Secondary | ICD-10-CM | POA: Diagnosis not present

## 2021-09-13 ENCOUNTER — Other Ambulatory Visit: Payer: Self-pay | Admitting: Family Medicine

## 2021-09-13 DIAGNOSIS — N3281 Overactive bladder: Secondary | ICD-10-CM

## 2021-09-14 DIAGNOSIS — M6281 Muscle weakness (generalized): Secondary | ICD-10-CM | POA: Diagnosis not present

## 2021-09-14 DIAGNOSIS — M1990 Unspecified osteoarthritis, unspecified site: Secondary | ICD-10-CM | POA: Diagnosis not present

## 2021-09-14 DIAGNOSIS — R1312 Dysphagia, oropharyngeal phase: Secondary | ICD-10-CM | POA: Diagnosis not present

## 2021-09-14 DIAGNOSIS — E550 Rickets, active: Secondary | ICD-10-CM | POA: Diagnosis not present

## 2021-09-14 DIAGNOSIS — R2689 Other abnormalities of gait and mobility: Secondary | ICD-10-CM | POA: Diagnosis not present

## 2021-09-14 DIAGNOSIS — Z9181 History of falling: Secondary | ICD-10-CM | POA: Diagnosis not present

## 2021-09-14 DIAGNOSIS — I4891 Unspecified atrial fibrillation: Secondary | ICD-10-CM | POA: Diagnosis not present

## 2021-09-14 DIAGNOSIS — Z79899 Other long term (current) drug therapy: Secondary | ICD-10-CM | POA: Diagnosis not present

## 2021-09-14 DIAGNOSIS — R2681 Unsteadiness on feet: Secondary | ICD-10-CM | POA: Diagnosis not present

## 2021-09-14 DIAGNOSIS — N1832 Chronic kidney disease, stage 3b: Secondary | ICD-10-CM | POA: Diagnosis not present

## 2021-09-14 DIAGNOSIS — E119 Type 2 diabetes mellitus without complications: Secondary | ICD-10-CM | POA: Diagnosis not present

## 2021-09-15 DIAGNOSIS — R2689 Other abnormalities of gait and mobility: Secondary | ICD-10-CM | POA: Diagnosis not present

## 2021-09-15 DIAGNOSIS — M6281 Muscle weakness (generalized): Secondary | ICD-10-CM | POA: Diagnosis not present

## 2021-09-15 DIAGNOSIS — R1312 Dysphagia, oropharyngeal phase: Secondary | ICD-10-CM | POA: Diagnosis not present

## 2021-09-15 DIAGNOSIS — Z9181 History of falling: Secondary | ICD-10-CM | POA: Diagnosis not present

## 2021-09-15 DIAGNOSIS — R2681 Unsteadiness on feet: Secondary | ICD-10-CM | POA: Diagnosis not present

## 2021-09-15 DIAGNOSIS — I4891 Unspecified atrial fibrillation: Secondary | ICD-10-CM | POA: Diagnosis not present

## 2021-09-16 DIAGNOSIS — R1312 Dysphagia, oropharyngeal phase: Secondary | ICD-10-CM | POA: Diagnosis not present

## 2021-09-16 DIAGNOSIS — M6281 Muscle weakness (generalized): Secondary | ICD-10-CM | POA: Diagnosis not present

## 2021-09-16 DIAGNOSIS — I4891 Unspecified atrial fibrillation: Secondary | ICD-10-CM | POA: Diagnosis not present

## 2021-09-16 DIAGNOSIS — R2681 Unsteadiness on feet: Secondary | ICD-10-CM | POA: Diagnosis not present

## 2021-09-16 DIAGNOSIS — Z9181 History of falling: Secondary | ICD-10-CM | POA: Diagnosis not present

## 2021-09-16 DIAGNOSIS — R2689 Other abnormalities of gait and mobility: Secondary | ICD-10-CM | POA: Diagnosis not present

## 2021-09-17 DIAGNOSIS — I4891 Unspecified atrial fibrillation: Secondary | ICD-10-CM | POA: Diagnosis not present

## 2021-09-17 DIAGNOSIS — R2681 Unsteadiness on feet: Secondary | ICD-10-CM | POA: Diagnosis not present

## 2021-09-17 DIAGNOSIS — M6281 Muscle weakness (generalized): Secondary | ICD-10-CM | POA: Diagnosis not present

## 2021-09-17 DIAGNOSIS — Z9181 History of falling: Secondary | ICD-10-CM | POA: Diagnosis not present

## 2021-09-17 DIAGNOSIS — R1312 Dysphagia, oropharyngeal phase: Secondary | ICD-10-CM | POA: Diagnosis not present

## 2021-09-17 DIAGNOSIS — R2689 Other abnormalities of gait and mobility: Secondary | ICD-10-CM | POA: Diagnosis not present

## 2021-09-18 DIAGNOSIS — I4891 Unspecified atrial fibrillation: Secondary | ICD-10-CM | POA: Diagnosis not present

## 2021-09-18 DIAGNOSIS — R2681 Unsteadiness on feet: Secondary | ICD-10-CM | POA: Diagnosis not present

## 2021-09-18 DIAGNOSIS — M6281 Muscle weakness (generalized): Secondary | ICD-10-CM | POA: Diagnosis not present

## 2021-09-18 DIAGNOSIS — R2689 Other abnormalities of gait and mobility: Secondary | ICD-10-CM | POA: Diagnosis not present

## 2021-09-18 DIAGNOSIS — Z9181 History of falling: Secondary | ICD-10-CM | POA: Diagnosis not present

## 2021-09-18 DIAGNOSIS — R1312 Dysphagia, oropharyngeal phase: Secondary | ICD-10-CM | POA: Diagnosis not present

## 2021-09-19 DIAGNOSIS — Z9181 History of falling: Secondary | ICD-10-CM | POA: Diagnosis not present

## 2021-09-19 DIAGNOSIS — M6281 Muscle weakness (generalized): Secondary | ICD-10-CM | POA: Diagnosis not present

## 2021-09-19 DIAGNOSIS — I4891 Unspecified atrial fibrillation: Secondary | ICD-10-CM | POA: Diagnosis not present

## 2021-09-19 DIAGNOSIS — R2689 Other abnormalities of gait and mobility: Secondary | ICD-10-CM | POA: Diagnosis not present

## 2021-09-19 DIAGNOSIS — R1312 Dysphagia, oropharyngeal phase: Secondary | ICD-10-CM | POA: Diagnosis not present

## 2021-09-19 DIAGNOSIS — R2681 Unsteadiness on feet: Secondary | ICD-10-CM | POA: Diagnosis not present

## 2021-09-20 DIAGNOSIS — R2681 Unsteadiness on feet: Secondary | ICD-10-CM | POA: Diagnosis not present

## 2021-09-20 DIAGNOSIS — R1312 Dysphagia, oropharyngeal phase: Secondary | ICD-10-CM | POA: Diagnosis not present

## 2021-09-20 DIAGNOSIS — Z9181 History of falling: Secondary | ICD-10-CM | POA: Diagnosis not present

## 2021-09-20 DIAGNOSIS — R2689 Other abnormalities of gait and mobility: Secondary | ICD-10-CM | POA: Diagnosis not present

## 2021-09-20 DIAGNOSIS — I4891 Unspecified atrial fibrillation: Secondary | ICD-10-CM | POA: Diagnosis not present

## 2021-09-20 DIAGNOSIS — M6281 Muscle weakness (generalized): Secondary | ICD-10-CM | POA: Diagnosis not present

## 2021-09-21 DIAGNOSIS — D509 Iron deficiency anemia, unspecified: Secondary | ICD-10-CM | POA: Diagnosis not present

## 2021-09-21 DIAGNOSIS — R2689 Other abnormalities of gait and mobility: Secondary | ICD-10-CM | POA: Diagnosis not present

## 2021-09-21 DIAGNOSIS — R1312 Dysphagia, oropharyngeal phase: Secondary | ICD-10-CM | POA: Diagnosis not present

## 2021-09-21 DIAGNOSIS — R2681 Unsteadiness on feet: Secondary | ICD-10-CM | POA: Diagnosis not present

## 2021-09-21 DIAGNOSIS — Z9181 History of falling: Secondary | ICD-10-CM | POA: Diagnosis not present

## 2021-09-21 DIAGNOSIS — M6281 Muscle weakness (generalized): Secondary | ICD-10-CM | POA: Diagnosis not present

## 2021-09-21 DIAGNOSIS — I4891 Unspecified atrial fibrillation: Secondary | ICD-10-CM | POA: Diagnosis not present

## 2021-09-21 DIAGNOSIS — N1832 Chronic kidney disease, stage 3b: Secondary | ICD-10-CM | POA: Diagnosis not present

## 2021-09-22 DIAGNOSIS — Z9181 History of falling: Secondary | ICD-10-CM | POA: Diagnosis not present

## 2021-09-22 DIAGNOSIS — R2689 Other abnormalities of gait and mobility: Secondary | ICD-10-CM | POA: Diagnosis not present

## 2021-09-22 DIAGNOSIS — M6281 Muscle weakness (generalized): Secondary | ICD-10-CM | POA: Diagnosis not present

## 2021-09-22 DIAGNOSIS — I4891 Unspecified atrial fibrillation: Secondary | ICD-10-CM | POA: Diagnosis not present

## 2021-09-22 DIAGNOSIS — R1312 Dysphagia, oropharyngeal phase: Secondary | ICD-10-CM | POA: Diagnosis not present

## 2021-09-22 DIAGNOSIS — R2681 Unsteadiness on feet: Secondary | ICD-10-CM | POA: Diagnosis not present

## 2021-09-23 DIAGNOSIS — R2681 Unsteadiness on feet: Secondary | ICD-10-CM | POA: Diagnosis not present

## 2021-09-23 DIAGNOSIS — R2689 Other abnormalities of gait and mobility: Secondary | ICD-10-CM | POA: Diagnosis not present

## 2021-09-23 DIAGNOSIS — M6281 Muscle weakness (generalized): Secondary | ICD-10-CM | POA: Diagnosis not present

## 2021-09-23 DIAGNOSIS — Z9181 History of falling: Secondary | ICD-10-CM | POA: Diagnosis not present

## 2021-09-23 DIAGNOSIS — I4891 Unspecified atrial fibrillation: Secondary | ICD-10-CM | POA: Diagnosis not present

## 2021-09-23 DIAGNOSIS — R1312 Dysphagia, oropharyngeal phase: Secondary | ICD-10-CM | POA: Diagnosis not present

## 2021-09-24 DIAGNOSIS — I4891 Unspecified atrial fibrillation: Secondary | ICD-10-CM | POA: Diagnosis not present

## 2021-09-24 DIAGNOSIS — R1312 Dysphagia, oropharyngeal phase: Secondary | ICD-10-CM | POA: Diagnosis not present

## 2021-09-24 DIAGNOSIS — M6281 Muscle weakness (generalized): Secondary | ICD-10-CM | POA: Diagnosis not present

## 2021-09-24 DIAGNOSIS — Z9181 History of falling: Secondary | ICD-10-CM | POA: Diagnosis not present

## 2021-09-24 DIAGNOSIS — R2689 Other abnormalities of gait and mobility: Secondary | ICD-10-CM | POA: Diagnosis not present

## 2021-09-24 DIAGNOSIS — R2681 Unsteadiness on feet: Secondary | ICD-10-CM | POA: Diagnosis not present

## 2021-09-25 DIAGNOSIS — R1312 Dysphagia, oropharyngeal phase: Secondary | ICD-10-CM | POA: Diagnosis not present

## 2021-09-25 DIAGNOSIS — Z9181 History of falling: Secondary | ICD-10-CM | POA: Diagnosis not present

## 2021-09-25 DIAGNOSIS — R2689 Other abnormalities of gait and mobility: Secondary | ICD-10-CM | POA: Diagnosis not present

## 2021-09-25 DIAGNOSIS — R2681 Unsteadiness on feet: Secondary | ICD-10-CM | POA: Diagnosis not present

## 2021-09-25 DIAGNOSIS — M6281 Muscle weakness (generalized): Secondary | ICD-10-CM | POA: Diagnosis not present

## 2021-09-25 DIAGNOSIS — I4891 Unspecified atrial fibrillation: Secondary | ICD-10-CM | POA: Diagnosis not present

## 2021-09-28 DIAGNOSIS — M6281 Muscle weakness (generalized): Secondary | ICD-10-CM | POA: Diagnosis not present

## 2021-09-28 DIAGNOSIS — R4182 Altered mental status, unspecified: Secondary | ICD-10-CM | POA: Diagnosis not present

## 2021-09-28 DIAGNOSIS — N390 Urinary tract infection, site not specified: Secondary | ICD-10-CM | POA: Diagnosis not present

## 2021-09-28 DIAGNOSIS — Z9181 History of falling: Secondary | ICD-10-CM | POA: Diagnosis not present

## 2021-09-28 DIAGNOSIS — I4891 Unspecified atrial fibrillation: Secondary | ICD-10-CM | POA: Diagnosis not present

## 2021-09-28 DIAGNOSIS — R2689 Other abnormalities of gait and mobility: Secondary | ICD-10-CM | POA: Diagnosis not present

## 2021-09-28 DIAGNOSIS — R2681 Unsteadiness on feet: Secondary | ICD-10-CM | POA: Diagnosis not present

## 2021-09-28 DIAGNOSIS — E861 Hypovolemia: Secondary | ICD-10-CM | POA: Diagnosis not present

## 2021-09-28 DIAGNOSIS — R1312 Dysphagia, oropharyngeal phase: Secondary | ICD-10-CM | POA: Diagnosis not present

## 2021-09-29 DIAGNOSIS — R2681 Unsteadiness on feet: Secondary | ICD-10-CM | POA: Diagnosis not present

## 2021-09-29 DIAGNOSIS — Z9181 History of falling: Secondary | ICD-10-CM | POA: Diagnosis not present

## 2021-09-29 DIAGNOSIS — R2689 Other abnormalities of gait and mobility: Secondary | ICD-10-CM | POA: Diagnosis not present

## 2021-09-29 DIAGNOSIS — R1312 Dysphagia, oropharyngeal phase: Secondary | ICD-10-CM | POA: Diagnosis not present

## 2021-09-29 DIAGNOSIS — I4891 Unspecified atrial fibrillation: Secondary | ICD-10-CM | POA: Diagnosis not present

## 2021-09-29 DIAGNOSIS — M6281 Muscle weakness (generalized): Secondary | ICD-10-CM | POA: Diagnosis not present

## 2021-09-30 DIAGNOSIS — R2689 Other abnormalities of gait and mobility: Secondary | ICD-10-CM | POA: Diagnosis not present

## 2021-09-30 DIAGNOSIS — R1312 Dysphagia, oropharyngeal phase: Secondary | ICD-10-CM | POA: Diagnosis not present

## 2021-09-30 DIAGNOSIS — M6281 Muscle weakness (generalized): Secondary | ICD-10-CM | POA: Diagnosis not present

## 2021-09-30 DIAGNOSIS — Z9181 History of falling: Secondary | ICD-10-CM | POA: Diagnosis not present

## 2021-09-30 DIAGNOSIS — I4891 Unspecified atrial fibrillation: Secondary | ICD-10-CM | POA: Diagnosis not present

## 2021-09-30 DIAGNOSIS — R2681 Unsteadiness on feet: Secondary | ICD-10-CM | POA: Diagnosis not present

## 2021-10-01 DIAGNOSIS — Z9181 History of falling: Secondary | ICD-10-CM | POA: Diagnosis not present

## 2021-10-01 DIAGNOSIS — R2689 Other abnormalities of gait and mobility: Secondary | ICD-10-CM | POA: Diagnosis not present

## 2021-10-01 DIAGNOSIS — R1312 Dysphagia, oropharyngeal phase: Secondary | ICD-10-CM | POA: Diagnosis not present

## 2021-10-01 DIAGNOSIS — R2681 Unsteadiness on feet: Secondary | ICD-10-CM | POA: Diagnosis not present

## 2021-10-01 DIAGNOSIS — M6281 Muscle weakness (generalized): Secondary | ICD-10-CM | POA: Diagnosis not present

## 2021-10-01 DIAGNOSIS — I4891 Unspecified atrial fibrillation: Secondary | ICD-10-CM | POA: Diagnosis not present

## 2021-10-02 DIAGNOSIS — Z9181 History of falling: Secondary | ICD-10-CM | POA: Diagnosis not present

## 2021-10-02 DIAGNOSIS — I4891 Unspecified atrial fibrillation: Secondary | ICD-10-CM | POA: Diagnosis not present

## 2021-10-02 DIAGNOSIS — R2689 Other abnormalities of gait and mobility: Secondary | ICD-10-CM | POA: Diagnosis not present

## 2021-10-02 DIAGNOSIS — R1312 Dysphagia, oropharyngeal phase: Secondary | ICD-10-CM | POA: Diagnosis not present

## 2021-10-02 DIAGNOSIS — R2681 Unsteadiness on feet: Secondary | ICD-10-CM | POA: Diagnosis not present

## 2021-10-02 DIAGNOSIS — M6281 Muscle weakness (generalized): Secondary | ICD-10-CM | POA: Diagnosis not present

## 2021-10-05 DIAGNOSIS — R1312 Dysphagia, oropharyngeal phase: Secondary | ICD-10-CM | POA: Diagnosis not present

## 2021-10-05 DIAGNOSIS — I4891 Unspecified atrial fibrillation: Secondary | ICD-10-CM | POA: Diagnosis not present

## 2021-10-05 DIAGNOSIS — R2681 Unsteadiness on feet: Secondary | ICD-10-CM | POA: Diagnosis not present

## 2021-10-05 DIAGNOSIS — Z9181 History of falling: Secondary | ICD-10-CM | POA: Diagnosis not present

## 2021-10-05 DIAGNOSIS — R2689 Other abnormalities of gait and mobility: Secondary | ICD-10-CM | POA: Diagnosis not present

## 2021-10-05 DIAGNOSIS — M6281 Muscle weakness (generalized): Secondary | ICD-10-CM | POA: Diagnosis not present

## 2021-10-06 DIAGNOSIS — M6281 Muscle weakness (generalized): Secondary | ICD-10-CM | POA: Diagnosis not present

## 2021-10-06 DIAGNOSIS — R2681 Unsteadiness on feet: Secondary | ICD-10-CM | POA: Diagnosis not present

## 2021-10-06 DIAGNOSIS — I4891 Unspecified atrial fibrillation: Secondary | ICD-10-CM | POA: Diagnosis not present

## 2021-10-06 DIAGNOSIS — Z9181 History of falling: Secondary | ICD-10-CM | POA: Diagnosis not present

## 2021-10-06 DIAGNOSIS — R1312 Dysphagia, oropharyngeal phase: Secondary | ICD-10-CM | POA: Diagnosis not present

## 2021-10-06 DIAGNOSIS — R2689 Other abnormalities of gait and mobility: Secondary | ICD-10-CM | POA: Diagnosis not present

## 2021-10-07 DIAGNOSIS — R2681 Unsteadiness on feet: Secondary | ICD-10-CM | POA: Diagnosis not present

## 2021-10-07 DIAGNOSIS — R2689 Other abnormalities of gait and mobility: Secondary | ICD-10-CM | POA: Diagnosis not present

## 2021-10-07 DIAGNOSIS — Z9181 History of falling: Secondary | ICD-10-CM | POA: Diagnosis not present

## 2021-10-07 DIAGNOSIS — I4891 Unspecified atrial fibrillation: Secondary | ICD-10-CM | POA: Diagnosis not present

## 2021-10-07 DIAGNOSIS — M6281 Muscle weakness (generalized): Secondary | ICD-10-CM | POA: Diagnosis not present

## 2021-10-07 DIAGNOSIS — R1312 Dysphagia, oropharyngeal phase: Secondary | ICD-10-CM | POA: Diagnosis not present

## 2021-10-08 DIAGNOSIS — Z9181 History of falling: Secondary | ICD-10-CM | POA: Diagnosis not present

## 2021-10-08 DIAGNOSIS — R2689 Other abnormalities of gait and mobility: Secondary | ICD-10-CM | POA: Diagnosis not present

## 2021-10-08 DIAGNOSIS — R1312 Dysphagia, oropharyngeal phase: Secondary | ICD-10-CM | POA: Diagnosis not present

## 2021-10-08 DIAGNOSIS — I4891 Unspecified atrial fibrillation: Secondary | ICD-10-CM | POA: Diagnosis not present

## 2021-10-08 DIAGNOSIS — M6281 Muscle weakness (generalized): Secondary | ICD-10-CM | POA: Diagnosis not present

## 2021-10-08 DIAGNOSIS — R2681 Unsteadiness on feet: Secondary | ICD-10-CM | POA: Diagnosis not present

## 2021-10-09 DIAGNOSIS — R2681 Unsteadiness on feet: Secondary | ICD-10-CM | POA: Diagnosis not present

## 2021-10-09 DIAGNOSIS — I4891 Unspecified atrial fibrillation: Secondary | ICD-10-CM | POA: Diagnosis not present

## 2021-10-09 DIAGNOSIS — R2689 Other abnormalities of gait and mobility: Secondary | ICD-10-CM | POA: Diagnosis not present

## 2021-10-09 DIAGNOSIS — Z9181 History of falling: Secondary | ICD-10-CM | POA: Diagnosis not present

## 2021-10-09 DIAGNOSIS — M6281 Muscle weakness (generalized): Secondary | ICD-10-CM | POA: Diagnosis not present

## 2021-10-09 DIAGNOSIS — R1312 Dysphagia, oropharyngeal phase: Secondary | ICD-10-CM | POA: Diagnosis not present

## 2021-10-12 DIAGNOSIS — R2681 Unsteadiness on feet: Secondary | ICD-10-CM | POA: Diagnosis not present

## 2021-10-12 DIAGNOSIS — R1312 Dysphagia, oropharyngeal phase: Secondary | ICD-10-CM | POA: Diagnosis not present

## 2021-10-12 DIAGNOSIS — R2689 Other abnormalities of gait and mobility: Secondary | ICD-10-CM | POA: Diagnosis not present

## 2021-10-12 DIAGNOSIS — Z9181 History of falling: Secondary | ICD-10-CM | POA: Diagnosis not present

## 2021-10-12 DIAGNOSIS — I4891 Unspecified atrial fibrillation: Secondary | ICD-10-CM | POA: Diagnosis not present

## 2021-10-12 DIAGNOSIS — M6281 Muscle weakness (generalized): Secondary | ICD-10-CM | POA: Diagnosis not present

## 2021-10-13 DIAGNOSIS — I4891 Unspecified atrial fibrillation: Secondary | ICD-10-CM | POA: Diagnosis not present

## 2021-10-13 DIAGNOSIS — M6281 Muscle weakness (generalized): Secondary | ICD-10-CM | POA: Diagnosis not present

## 2021-10-13 DIAGNOSIS — Z9181 History of falling: Secondary | ICD-10-CM | POA: Diagnosis not present

## 2021-10-13 DIAGNOSIS — R2681 Unsteadiness on feet: Secondary | ICD-10-CM | POA: Diagnosis not present

## 2021-10-13 DIAGNOSIS — R1312 Dysphagia, oropharyngeal phase: Secondary | ICD-10-CM | POA: Diagnosis not present

## 2021-10-13 DIAGNOSIS — R2689 Other abnormalities of gait and mobility: Secondary | ICD-10-CM | POA: Diagnosis not present

## 2021-10-14 DIAGNOSIS — Z9181 History of falling: Secondary | ICD-10-CM | POA: Diagnosis not present

## 2021-10-14 DIAGNOSIS — R1312 Dysphagia, oropharyngeal phase: Secondary | ICD-10-CM | POA: Diagnosis not present

## 2021-10-14 DIAGNOSIS — M6281 Muscle weakness (generalized): Secondary | ICD-10-CM | POA: Diagnosis not present

## 2021-10-14 DIAGNOSIS — I4891 Unspecified atrial fibrillation: Secondary | ICD-10-CM | POA: Diagnosis not present

## 2021-10-14 DIAGNOSIS — R2681 Unsteadiness on feet: Secondary | ICD-10-CM | POA: Diagnosis not present

## 2021-10-14 DIAGNOSIS — R2689 Other abnormalities of gait and mobility: Secondary | ICD-10-CM | POA: Diagnosis not present

## 2021-10-15 DIAGNOSIS — I4891 Unspecified atrial fibrillation: Secondary | ICD-10-CM | POA: Diagnosis not present

## 2021-10-15 DIAGNOSIS — N1832 Chronic kidney disease, stage 3b: Secondary | ICD-10-CM | POA: Diagnosis not present

## 2021-10-15 DIAGNOSIS — R2689 Other abnormalities of gait and mobility: Secondary | ICD-10-CM | POA: Diagnosis not present

## 2021-10-15 DIAGNOSIS — R2681 Unsteadiness on feet: Secondary | ICD-10-CM | POA: Diagnosis not present

## 2021-10-15 DIAGNOSIS — M6281 Muscle weakness (generalized): Secondary | ICD-10-CM | POA: Diagnosis not present

## 2021-10-15 DIAGNOSIS — Z9181 History of falling: Secondary | ICD-10-CM | POA: Diagnosis not present

## 2021-10-15 DIAGNOSIS — M1990 Unspecified osteoarthritis, unspecified site: Secondary | ICD-10-CM | POA: Diagnosis not present

## 2021-10-16 DIAGNOSIS — I4891 Unspecified atrial fibrillation: Secondary | ICD-10-CM | POA: Diagnosis not present

## 2021-10-16 DIAGNOSIS — Z9181 History of falling: Secondary | ICD-10-CM | POA: Diagnosis not present

## 2021-10-16 DIAGNOSIS — R2689 Other abnormalities of gait and mobility: Secondary | ICD-10-CM | POA: Diagnosis not present

## 2021-10-16 DIAGNOSIS — N1832 Chronic kidney disease, stage 3b: Secondary | ICD-10-CM | POA: Diagnosis not present

## 2021-10-16 DIAGNOSIS — M6281 Muscle weakness (generalized): Secondary | ICD-10-CM | POA: Diagnosis not present

## 2021-10-16 DIAGNOSIS — R2681 Unsteadiness on feet: Secondary | ICD-10-CM | POA: Diagnosis not present

## 2021-10-19 DIAGNOSIS — N1832 Chronic kidney disease, stage 3b: Secondary | ICD-10-CM | POA: Diagnosis not present

## 2021-10-19 DIAGNOSIS — I4891 Unspecified atrial fibrillation: Secondary | ICD-10-CM | POA: Diagnosis not present

## 2021-10-19 DIAGNOSIS — R2689 Other abnormalities of gait and mobility: Secondary | ICD-10-CM | POA: Diagnosis not present

## 2021-10-19 DIAGNOSIS — Z9181 History of falling: Secondary | ICD-10-CM | POA: Diagnosis not present

## 2021-10-19 DIAGNOSIS — M6281 Muscle weakness (generalized): Secondary | ICD-10-CM | POA: Diagnosis not present

## 2021-10-19 DIAGNOSIS — R2681 Unsteadiness on feet: Secondary | ICD-10-CM | POA: Diagnosis not present

## 2021-10-20 DIAGNOSIS — N1832 Chronic kidney disease, stage 3b: Secondary | ICD-10-CM | POA: Diagnosis not present

## 2021-10-20 DIAGNOSIS — I2510 Atherosclerotic heart disease of native coronary artery without angina pectoris: Secondary | ICD-10-CM | POA: Diagnosis not present

## 2021-10-20 DIAGNOSIS — R296 Repeated falls: Secondary | ICD-10-CM | POA: Diagnosis not present

## 2021-10-20 DIAGNOSIS — R2689 Other abnormalities of gait and mobility: Secondary | ICD-10-CM | POA: Diagnosis not present

## 2021-10-20 DIAGNOSIS — I4891 Unspecified atrial fibrillation: Secondary | ICD-10-CM | POA: Diagnosis not present

## 2021-10-20 DIAGNOSIS — I130 Hypertensive heart and chronic kidney disease with heart failure and stage 1 through stage 4 chronic kidney disease, or unspecified chronic kidney disease: Secondary | ICD-10-CM | POA: Diagnosis not present

## 2021-10-20 DIAGNOSIS — E785 Hyperlipidemia, unspecified: Secondary | ICD-10-CM | POA: Diagnosis not present

## 2021-10-20 DIAGNOSIS — M6281 Muscle weakness (generalized): Secondary | ICD-10-CM | POA: Diagnosis not present

## 2021-10-20 DIAGNOSIS — R2681 Unsteadiness on feet: Secondary | ICD-10-CM | POA: Diagnosis not present

## 2021-10-20 DIAGNOSIS — Z9181 History of falling: Secondary | ICD-10-CM | POA: Diagnosis not present

## 2021-10-21 DIAGNOSIS — M6281 Muscle weakness (generalized): Secondary | ICD-10-CM | POA: Diagnosis not present

## 2021-10-21 DIAGNOSIS — N1832 Chronic kidney disease, stage 3b: Secondary | ICD-10-CM | POA: Diagnosis not present

## 2021-10-21 DIAGNOSIS — I4891 Unspecified atrial fibrillation: Secondary | ICD-10-CM | POA: Diagnosis not present

## 2021-10-21 DIAGNOSIS — Z9181 History of falling: Secondary | ICD-10-CM | POA: Diagnosis not present

## 2021-10-21 DIAGNOSIS — R2689 Other abnormalities of gait and mobility: Secondary | ICD-10-CM | POA: Diagnosis not present

## 2021-10-21 DIAGNOSIS — R2681 Unsteadiness on feet: Secondary | ICD-10-CM | POA: Diagnosis not present

## 2021-10-22 DIAGNOSIS — N1832 Chronic kidney disease, stage 3b: Secondary | ICD-10-CM | POA: Diagnosis not present

## 2021-10-22 DIAGNOSIS — M6281 Muscle weakness (generalized): Secondary | ICD-10-CM | POA: Diagnosis not present

## 2021-10-22 DIAGNOSIS — R2689 Other abnormalities of gait and mobility: Secondary | ICD-10-CM | POA: Diagnosis not present

## 2021-10-22 DIAGNOSIS — R2681 Unsteadiness on feet: Secondary | ICD-10-CM | POA: Diagnosis not present

## 2021-10-22 DIAGNOSIS — I4891 Unspecified atrial fibrillation: Secondary | ICD-10-CM | POA: Diagnosis not present

## 2021-10-22 DIAGNOSIS — Z9181 History of falling: Secondary | ICD-10-CM | POA: Diagnosis not present

## 2021-10-23 DIAGNOSIS — I4891 Unspecified atrial fibrillation: Secondary | ICD-10-CM | POA: Diagnosis not present

## 2021-10-23 DIAGNOSIS — R2681 Unsteadiness on feet: Secondary | ICD-10-CM | POA: Diagnosis not present

## 2021-10-23 DIAGNOSIS — Z9181 History of falling: Secondary | ICD-10-CM | POA: Diagnosis not present

## 2021-10-23 DIAGNOSIS — R2689 Other abnormalities of gait and mobility: Secondary | ICD-10-CM | POA: Diagnosis not present

## 2021-10-23 DIAGNOSIS — M6281 Muscle weakness (generalized): Secondary | ICD-10-CM | POA: Diagnosis not present

## 2021-10-23 DIAGNOSIS — N1832 Chronic kidney disease, stage 3b: Secondary | ICD-10-CM | POA: Diagnosis not present

## 2021-10-24 DIAGNOSIS — Z9181 History of falling: Secondary | ICD-10-CM | POA: Diagnosis not present

## 2021-10-24 DIAGNOSIS — R2689 Other abnormalities of gait and mobility: Secondary | ICD-10-CM | POA: Diagnosis not present

## 2021-10-24 DIAGNOSIS — M6281 Muscle weakness (generalized): Secondary | ICD-10-CM | POA: Diagnosis not present

## 2021-10-24 DIAGNOSIS — I4891 Unspecified atrial fibrillation: Secondary | ICD-10-CM | POA: Diagnosis not present

## 2021-10-24 DIAGNOSIS — R2681 Unsteadiness on feet: Secondary | ICD-10-CM | POA: Diagnosis not present

## 2021-10-24 DIAGNOSIS — N1832 Chronic kidney disease, stage 3b: Secondary | ICD-10-CM | POA: Diagnosis not present

## 2021-10-25 DIAGNOSIS — M6281 Muscle weakness (generalized): Secondary | ICD-10-CM | POA: Diagnosis not present

## 2021-10-25 DIAGNOSIS — N1832 Chronic kidney disease, stage 3b: Secondary | ICD-10-CM | POA: Diagnosis not present

## 2021-10-25 DIAGNOSIS — Z9181 History of falling: Secondary | ICD-10-CM | POA: Diagnosis not present

## 2021-10-25 DIAGNOSIS — R2689 Other abnormalities of gait and mobility: Secondary | ICD-10-CM | POA: Diagnosis not present

## 2021-10-25 DIAGNOSIS — R2681 Unsteadiness on feet: Secondary | ICD-10-CM | POA: Diagnosis not present

## 2021-10-25 DIAGNOSIS — I4891 Unspecified atrial fibrillation: Secondary | ICD-10-CM | POA: Diagnosis not present

## 2021-10-26 DIAGNOSIS — N1832 Chronic kidney disease, stage 3b: Secondary | ICD-10-CM | POA: Diagnosis not present

## 2021-10-26 DIAGNOSIS — I4891 Unspecified atrial fibrillation: Secondary | ICD-10-CM | POA: Diagnosis not present

## 2021-10-26 DIAGNOSIS — R2689 Other abnormalities of gait and mobility: Secondary | ICD-10-CM | POA: Diagnosis not present

## 2021-10-26 DIAGNOSIS — Z9181 History of falling: Secondary | ICD-10-CM | POA: Diagnosis not present

## 2021-10-26 DIAGNOSIS — M6281 Muscle weakness (generalized): Secondary | ICD-10-CM | POA: Diagnosis not present

## 2021-10-26 DIAGNOSIS — R2681 Unsteadiness on feet: Secondary | ICD-10-CM | POA: Diagnosis not present

## 2021-10-27 DIAGNOSIS — I4891 Unspecified atrial fibrillation: Secondary | ICD-10-CM | POA: Diagnosis not present

## 2021-10-27 DIAGNOSIS — R2689 Other abnormalities of gait and mobility: Secondary | ICD-10-CM | POA: Diagnosis not present

## 2021-10-27 DIAGNOSIS — N1832 Chronic kidney disease, stage 3b: Secondary | ICD-10-CM | POA: Diagnosis not present

## 2021-10-27 DIAGNOSIS — R2681 Unsteadiness on feet: Secondary | ICD-10-CM | POA: Diagnosis not present

## 2021-10-27 DIAGNOSIS — Z9181 History of falling: Secondary | ICD-10-CM | POA: Diagnosis not present

## 2021-10-27 DIAGNOSIS — M6281 Muscle weakness (generalized): Secondary | ICD-10-CM | POA: Diagnosis not present

## 2021-10-28 DIAGNOSIS — R2681 Unsteadiness on feet: Secondary | ICD-10-CM | POA: Diagnosis not present

## 2021-10-28 DIAGNOSIS — R2689 Other abnormalities of gait and mobility: Secondary | ICD-10-CM | POA: Diagnosis not present

## 2021-10-28 DIAGNOSIS — M6281 Muscle weakness (generalized): Secondary | ICD-10-CM | POA: Diagnosis not present

## 2021-10-28 DIAGNOSIS — I4891 Unspecified atrial fibrillation: Secondary | ICD-10-CM | POA: Diagnosis not present

## 2021-10-28 DIAGNOSIS — N1832 Chronic kidney disease, stage 3b: Secondary | ICD-10-CM | POA: Diagnosis not present

## 2021-10-28 DIAGNOSIS — J029 Acute pharyngitis, unspecified: Secondary | ICD-10-CM | POA: Diagnosis not present

## 2021-10-28 DIAGNOSIS — Z9181 History of falling: Secondary | ICD-10-CM | POA: Diagnosis not present

## 2021-10-29 DIAGNOSIS — M6281 Muscle weakness (generalized): Secondary | ICD-10-CM | POA: Diagnosis not present

## 2021-10-29 DIAGNOSIS — R2689 Other abnormalities of gait and mobility: Secondary | ICD-10-CM | POA: Diagnosis not present

## 2021-10-29 DIAGNOSIS — Z9181 History of falling: Secondary | ICD-10-CM | POA: Diagnosis not present

## 2021-10-29 DIAGNOSIS — I4891 Unspecified atrial fibrillation: Secondary | ICD-10-CM | POA: Diagnosis not present

## 2021-10-29 DIAGNOSIS — R2681 Unsteadiness on feet: Secondary | ICD-10-CM | POA: Diagnosis not present

## 2021-10-29 DIAGNOSIS — N1832 Chronic kidney disease, stage 3b: Secondary | ICD-10-CM | POA: Diagnosis not present

## 2021-11-18 DIAGNOSIS — L299 Pruritus, unspecified: Secondary | ICD-10-CM | POA: Diagnosis not present

## 2021-11-24 DIAGNOSIS — E785 Hyperlipidemia, unspecified: Secondary | ICD-10-CM | POA: Diagnosis not present

## 2021-11-24 DIAGNOSIS — R54 Age-related physical debility: Secondary | ICD-10-CM | POA: Diagnosis not present

## 2021-11-24 DIAGNOSIS — R296 Repeated falls: Secondary | ICD-10-CM | POA: Diagnosis not present

## 2021-11-24 DIAGNOSIS — I130 Hypertensive heart and chronic kidney disease with heart failure and stage 1 through stage 4 chronic kidney disease, or unspecified chronic kidney disease: Secondary | ICD-10-CM | POA: Diagnosis not present

## 2021-12-15 DIAGNOSIS — R296 Repeated falls: Secondary | ICD-10-CM | POA: Diagnosis not present

## 2021-12-15 DIAGNOSIS — R54 Age-related physical debility: Secondary | ICD-10-CM | POA: Diagnosis not present

## 2021-12-15 DIAGNOSIS — I130 Hypertensive heart and chronic kidney disease with heart failure and stage 1 through stage 4 chronic kidney disease, or unspecified chronic kidney disease: Secondary | ICD-10-CM | POA: Diagnosis not present

## 2021-12-15 DIAGNOSIS — E785 Hyperlipidemia, unspecified: Secondary | ICD-10-CM | POA: Diagnosis not present

## 2021-12-19 DIAGNOSIS — E119 Type 2 diabetes mellitus without complications: Secondary | ICD-10-CM | POA: Diagnosis not present

## 2021-12-20 DIAGNOSIS — R279 Unspecified lack of coordination: Secondary | ICD-10-CM | POA: Diagnosis not present

## 2021-12-20 DIAGNOSIS — M6281 Muscle weakness (generalized): Secondary | ICD-10-CM | POA: Diagnosis not present

## 2021-12-20 DIAGNOSIS — R2689 Other abnormalities of gait and mobility: Secondary | ICD-10-CM | POA: Diagnosis not present

## 2021-12-20 DIAGNOSIS — M1990 Unspecified osteoarthritis, unspecified site: Secondary | ICD-10-CM | POA: Diagnosis not present

## 2021-12-20 DIAGNOSIS — N1832 Chronic kidney disease, stage 3b: Secondary | ICD-10-CM | POA: Diagnosis not present

## 2021-12-20 DIAGNOSIS — I4891 Unspecified atrial fibrillation: Secondary | ICD-10-CM | POA: Diagnosis not present

## 2021-12-20 DIAGNOSIS — R262 Difficulty in walking, not elsewhere classified: Secondary | ICD-10-CM | POA: Diagnosis not present

## 2021-12-20 DIAGNOSIS — I129 Hypertensive chronic kidney disease with stage 1 through stage 4 chronic kidney disease, or unspecified chronic kidney disease: Secondary | ICD-10-CM | POA: Diagnosis not present

## 2021-12-21 DIAGNOSIS — I129 Hypertensive chronic kidney disease with stage 1 through stage 4 chronic kidney disease, or unspecified chronic kidney disease: Secondary | ICD-10-CM | POA: Diagnosis not present

## 2021-12-21 DIAGNOSIS — I4891 Unspecified atrial fibrillation: Secondary | ICD-10-CM | POA: Diagnosis not present

## 2021-12-21 DIAGNOSIS — R2689 Other abnormalities of gait and mobility: Secondary | ICD-10-CM | POA: Diagnosis not present

## 2021-12-21 DIAGNOSIS — N1832 Chronic kidney disease, stage 3b: Secondary | ICD-10-CM | POA: Diagnosis not present

## 2021-12-21 DIAGNOSIS — R262 Difficulty in walking, not elsewhere classified: Secondary | ICD-10-CM | POA: Diagnosis not present

## 2021-12-21 DIAGNOSIS — M6281 Muscle weakness (generalized): Secondary | ICD-10-CM | POA: Diagnosis not present

## 2021-12-22 DIAGNOSIS — N1832 Chronic kidney disease, stage 3b: Secondary | ICD-10-CM | POA: Diagnosis not present

## 2021-12-22 DIAGNOSIS — R262 Difficulty in walking, not elsewhere classified: Secondary | ICD-10-CM | POA: Diagnosis not present

## 2021-12-22 DIAGNOSIS — I129 Hypertensive chronic kidney disease with stage 1 through stage 4 chronic kidney disease, or unspecified chronic kidney disease: Secondary | ICD-10-CM | POA: Diagnosis not present

## 2021-12-22 DIAGNOSIS — R2689 Other abnormalities of gait and mobility: Secondary | ICD-10-CM | POA: Diagnosis not present

## 2021-12-22 DIAGNOSIS — I4891 Unspecified atrial fibrillation: Secondary | ICD-10-CM | POA: Diagnosis not present

## 2021-12-22 DIAGNOSIS — M6281 Muscle weakness (generalized): Secondary | ICD-10-CM | POA: Diagnosis not present

## 2021-12-23 DIAGNOSIS — N1832 Chronic kidney disease, stage 3b: Secondary | ICD-10-CM | POA: Diagnosis not present

## 2021-12-23 DIAGNOSIS — R262 Difficulty in walking, not elsewhere classified: Secondary | ICD-10-CM | POA: Diagnosis not present

## 2021-12-23 DIAGNOSIS — M6281 Muscle weakness (generalized): Secondary | ICD-10-CM | POA: Diagnosis not present

## 2021-12-23 DIAGNOSIS — I4891 Unspecified atrial fibrillation: Secondary | ICD-10-CM | POA: Diagnosis not present

## 2021-12-23 DIAGNOSIS — I129 Hypertensive chronic kidney disease with stage 1 through stage 4 chronic kidney disease, or unspecified chronic kidney disease: Secondary | ICD-10-CM | POA: Diagnosis not present

## 2021-12-23 DIAGNOSIS — R2689 Other abnormalities of gait and mobility: Secondary | ICD-10-CM | POA: Diagnosis not present

## 2021-12-24 DIAGNOSIS — R2689 Other abnormalities of gait and mobility: Secondary | ICD-10-CM | POA: Diagnosis not present

## 2021-12-24 DIAGNOSIS — M6281 Muscle weakness (generalized): Secondary | ICD-10-CM | POA: Diagnosis not present

## 2021-12-24 DIAGNOSIS — R262 Difficulty in walking, not elsewhere classified: Secondary | ICD-10-CM | POA: Diagnosis not present

## 2021-12-24 DIAGNOSIS — I129 Hypertensive chronic kidney disease with stage 1 through stage 4 chronic kidney disease, or unspecified chronic kidney disease: Secondary | ICD-10-CM | POA: Diagnosis not present

## 2021-12-24 DIAGNOSIS — I4891 Unspecified atrial fibrillation: Secondary | ICD-10-CM | POA: Diagnosis not present

## 2021-12-24 DIAGNOSIS — N1832 Chronic kidney disease, stage 3b: Secondary | ICD-10-CM | POA: Diagnosis not present

## 2021-12-28 DIAGNOSIS — N1832 Chronic kidney disease, stage 3b: Secondary | ICD-10-CM | POA: Diagnosis not present

## 2021-12-28 DIAGNOSIS — I129 Hypertensive chronic kidney disease with stage 1 through stage 4 chronic kidney disease, or unspecified chronic kidney disease: Secondary | ICD-10-CM | POA: Diagnosis not present

## 2021-12-28 DIAGNOSIS — I4891 Unspecified atrial fibrillation: Secondary | ICD-10-CM | POA: Diagnosis not present

## 2021-12-28 DIAGNOSIS — M6281 Muscle weakness (generalized): Secondary | ICD-10-CM | POA: Diagnosis not present

## 2021-12-28 DIAGNOSIS — R262 Difficulty in walking, not elsewhere classified: Secondary | ICD-10-CM | POA: Diagnosis not present

## 2021-12-28 DIAGNOSIS — R2689 Other abnormalities of gait and mobility: Secondary | ICD-10-CM | POA: Diagnosis not present

## 2021-12-29 DIAGNOSIS — R262 Difficulty in walking, not elsewhere classified: Secondary | ICD-10-CM | POA: Diagnosis not present

## 2021-12-29 DIAGNOSIS — R2689 Other abnormalities of gait and mobility: Secondary | ICD-10-CM | POA: Diagnosis not present

## 2021-12-29 DIAGNOSIS — M6281 Muscle weakness (generalized): Secondary | ICD-10-CM | POA: Diagnosis not present

## 2021-12-29 DIAGNOSIS — I129 Hypertensive chronic kidney disease with stage 1 through stage 4 chronic kidney disease, or unspecified chronic kidney disease: Secondary | ICD-10-CM | POA: Diagnosis not present

## 2021-12-29 DIAGNOSIS — I4891 Unspecified atrial fibrillation: Secondary | ICD-10-CM | POA: Diagnosis not present

## 2021-12-29 DIAGNOSIS — N1832 Chronic kidney disease, stage 3b: Secondary | ICD-10-CM | POA: Diagnosis not present

## 2021-12-30 DIAGNOSIS — N1832 Chronic kidney disease, stage 3b: Secondary | ICD-10-CM | POA: Diagnosis not present

## 2021-12-30 DIAGNOSIS — I129 Hypertensive chronic kidney disease with stage 1 through stage 4 chronic kidney disease, or unspecified chronic kidney disease: Secondary | ICD-10-CM | POA: Diagnosis not present

## 2021-12-30 DIAGNOSIS — R262 Difficulty in walking, not elsewhere classified: Secondary | ICD-10-CM | POA: Diagnosis not present

## 2021-12-30 DIAGNOSIS — R2689 Other abnormalities of gait and mobility: Secondary | ICD-10-CM | POA: Diagnosis not present

## 2021-12-30 DIAGNOSIS — M6281 Muscle weakness (generalized): Secondary | ICD-10-CM | POA: Diagnosis not present

## 2021-12-30 DIAGNOSIS — I4891 Unspecified atrial fibrillation: Secondary | ICD-10-CM | POA: Diagnosis not present

## 2021-12-31 DIAGNOSIS — I4891 Unspecified atrial fibrillation: Secondary | ICD-10-CM | POA: Diagnosis not present

## 2021-12-31 DIAGNOSIS — I129 Hypertensive chronic kidney disease with stage 1 through stage 4 chronic kidney disease, or unspecified chronic kidney disease: Secondary | ICD-10-CM | POA: Diagnosis not present

## 2021-12-31 DIAGNOSIS — M6281 Muscle weakness (generalized): Secondary | ICD-10-CM | POA: Diagnosis not present

## 2021-12-31 DIAGNOSIS — N1832 Chronic kidney disease, stage 3b: Secondary | ICD-10-CM | POA: Diagnosis not present

## 2021-12-31 DIAGNOSIS — R262 Difficulty in walking, not elsewhere classified: Secondary | ICD-10-CM | POA: Diagnosis not present

## 2021-12-31 DIAGNOSIS — R2689 Other abnormalities of gait and mobility: Secondary | ICD-10-CM | POA: Diagnosis not present

## 2022-01-01 DIAGNOSIS — R262 Difficulty in walking, not elsewhere classified: Secondary | ICD-10-CM | POA: Diagnosis not present

## 2022-01-01 DIAGNOSIS — M6281 Muscle weakness (generalized): Secondary | ICD-10-CM | POA: Diagnosis not present

## 2022-01-01 DIAGNOSIS — I129 Hypertensive chronic kidney disease with stage 1 through stage 4 chronic kidney disease, or unspecified chronic kidney disease: Secondary | ICD-10-CM | POA: Diagnosis not present

## 2022-01-01 DIAGNOSIS — I4891 Unspecified atrial fibrillation: Secondary | ICD-10-CM | POA: Diagnosis not present

## 2022-01-01 DIAGNOSIS — N1832 Chronic kidney disease, stage 3b: Secondary | ICD-10-CM | POA: Diagnosis not present

## 2022-01-01 DIAGNOSIS — R2689 Other abnormalities of gait and mobility: Secondary | ICD-10-CM | POA: Diagnosis not present

## 2022-01-04 DIAGNOSIS — I129 Hypertensive chronic kidney disease with stage 1 through stage 4 chronic kidney disease, or unspecified chronic kidney disease: Secondary | ICD-10-CM | POA: Diagnosis not present

## 2022-01-04 DIAGNOSIS — M6281 Muscle weakness (generalized): Secondary | ICD-10-CM | POA: Diagnosis not present

## 2022-01-04 DIAGNOSIS — I4891 Unspecified atrial fibrillation: Secondary | ICD-10-CM | POA: Diagnosis not present

## 2022-01-04 DIAGNOSIS — R2689 Other abnormalities of gait and mobility: Secondary | ICD-10-CM | POA: Diagnosis not present

## 2022-01-04 DIAGNOSIS — N1832 Chronic kidney disease, stage 3b: Secondary | ICD-10-CM | POA: Diagnosis not present

## 2022-01-04 DIAGNOSIS — R262 Difficulty in walking, not elsewhere classified: Secondary | ICD-10-CM | POA: Diagnosis not present

## 2022-01-08 DIAGNOSIS — I4891 Unspecified atrial fibrillation: Secondary | ICD-10-CM | POA: Diagnosis not present

## 2022-01-08 DIAGNOSIS — R262 Difficulty in walking, not elsewhere classified: Secondary | ICD-10-CM | POA: Diagnosis not present

## 2022-01-08 DIAGNOSIS — M6281 Muscle weakness (generalized): Secondary | ICD-10-CM | POA: Diagnosis not present

## 2022-01-08 DIAGNOSIS — N1832 Chronic kidney disease, stage 3b: Secondary | ICD-10-CM | POA: Diagnosis not present

## 2022-01-08 DIAGNOSIS — I129 Hypertensive chronic kidney disease with stage 1 through stage 4 chronic kidney disease, or unspecified chronic kidney disease: Secondary | ICD-10-CM | POA: Diagnosis not present

## 2022-01-08 DIAGNOSIS — R2689 Other abnormalities of gait and mobility: Secondary | ICD-10-CM | POA: Diagnosis not present

## 2022-01-11 DIAGNOSIS — N1832 Chronic kidney disease, stage 3b: Secondary | ICD-10-CM | POA: Diagnosis not present

## 2022-01-11 DIAGNOSIS — I4891 Unspecified atrial fibrillation: Secondary | ICD-10-CM | POA: Diagnosis not present

## 2022-01-11 DIAGNOSIS — I129 Hypertensive chronic kidney disease with stage 1 through stage 4 chronic kidney disease, or unspecified chronic kidney disease: Secondary | ICD-10-CM | POA: Diagnosis not present

## 2022-01-11 DIAGNOSIS — M6281 Muscle weakness (generalized): Secondary | ICD-10-CM | POA: Diagnosis not present

## 2022-01-11 DIAGNOSIS — R2689 Other abnormalities of gait and mobility: Secondary | ICD-10-CM | POA: Diagnosis not present

## 2022-01-11 DIAGNOSIS — R262 Difficulty in walking, not elsewhere classified: Secondary | ICD-10-CM | POA: Diagnosis not present

## 2022-01-12 DIAGNOSIS — N1832 Chronic kidney disease, stage 3b: Secondary | ICD-10-CM | POA: Diagnosis not present

## 2022-01-12 DIAGNOSIS — I129 Hypertensive chronic kidney disease with stage 1 through stage 4 chronic kidney disease, or unspecified chronic kidney disease: Secondary | ICD-10-CM | POA: Diagnosis not present

## 2022-01-12 DIAGNOSIS — M6281 Muscle weakness (generalized): Secondary | ICD-10-CM | POA: Diagnosis not present

## 2022-01-12 DIAGNOSIS — R262 Difficulty in walking, not elsewhere classified: Secondary | ICD-10-CM | POA: Diagnosis not present

## 2022-01-12 DIAGNOSIS — R2689 Other abnormalities of gait and mobility: Secondary | ICD-10-CM | POA: Diagnosis not present

## 2022-01-12 DIAGNOSIS — I4891 Unspecified atrial fibrillation: Secondary | ICD-10-CM | POA: Diagnosis not present

## 2022-01-13 DIAGNOSIS — N1832 Chronic kidney disease, stage 3b: Secondary | ICD-10-CM | POA: Diagnosis not present

## 2022-01-13 DIAGNOSIS — I129 Hypertensive chronic kidney disease with stage 1 through stage 4 chronic kidney disease, or unspecified chronic kidney disease: Secondary | ICD-10-CM | POA: Diagnosis not present

## 2022-01-13 DIAGNOSIS — I4891 Unspecified atrial fibrillation: Secondary | ICD-10-CM | POA: Diagnosis not present

## 2022-01-13 DIAGNOSIS — R2689 Other abnormalities of gait and mobility: Secondary | ICD-10-CM | POA: Diagnosis not present

## 2022-01-13 DIAGNOSIS — M6281 Muscle weakness (generalized): Secondary | ICD-10-CM | POA: Diagnosis not present

## 2022-01-13 DIAGNOSIS — R262 Difficulty in walking, not elsewhere classified: Secondary | ICD-10-CM | POA: Diagnosis not present

## 2022-01-14 DIAGNOSIS — R262 Difficulty in walking, not elsewhere classified: Secondary | ICD-10-CM | POA: Diagnosis not present

## 2022-01-14 DIAGNOSIS — R2689 Other abnormalities of gait and mobility: Secondary | ICD-10-CM | POA: Diagnosis not present

## 2022-01-14 DIAGNOSIS — I4891 Unspecified atrial fibrillation: Secondary | ICD-10-CM | POA: Diagnosis not present

## 2022-01-14 DIAGNOSIS — N1832 Chronic kidney disease, stage 3b: Secondary | ICD-10-CM | POA: Diagnosis not present

## 2022-01-14 DIAGNOSIS — M6281 Muscle weakness (generalized): Secondary | ICD-10-CM | POA: Diagnosis not present

## 2022-01-14 DIAGNOSIS — I129 Hypertensive chronic kidney disease with stage 1 through stage 4 chronic kidney disease, or unspecified chronic kidney disease: Secondary | ICD-10-CM | POA: Diagnosis not present

## 2022-01-15 DIAGNOSIS — I129 Hypertensive chronic kidney disease with stage 1 through stage 4 chronic kidney disease, or unspecified chronic kidney disease: Secondary | ICD-10-CM | POA: Diagnosis not present

## 2022-01-15 DIAGNOSIS — M6281 Muscle weakness (generalized): Secondary | ICD-10-CM | POA: Diagnosis not present

## 2022-01-15 DIAGNOSIS — M1990 Unspecified osteoarthritis, unspecified site: Secondary | ICD-10-CM | POA: Diagnosis not present

## 2022-01-15 DIAGNOSIS — N1832 Chronic kidney disease, stage 3b: Secondary | ICD-10-CM | POA: Diagnosis not present

## 2022-01-15 DIAGNOSIS — R279 Unspecified lack of coordination: Secondary | ICD-10-CM | POA: Diagnosis not present

## 2022-01-18 DIAGNOSIS — N1832 Chronic kidney disease, stage 3b: Secondary | ICD-10-CM | POA: Diagnosis not present

## 2022-01-18 DIAGNOSIS — M1990 Unspecified osteoarthritis, unspecified site: Secondary | ICD-10-CM | POA: Diagnosis not present

## 2022-01-18 DIAGNOSIS — M6281 Muscle weakness (generalized): Secondary | ICD-10-CM | POA: Diagnosis not present

## 2022-01-18 DIAGNOSIS — R279 Unspecified lack of coordination: Secondary | ICD-10-CM | POA: Diagnosis not present

## 2022-01-18 DIAGNOSIS — I129 Hypertensive chronic kidney disease with stage 1 through stage 4 chronic kidney disease, or unspecified chronic kidney disease: Secondary | ICD-10-CM | POA: Diagnosis not present

## 2022-01-19 DIAGNOSIS — R279 Unspecified lack of coordination: Secondary | ICD-10-CM | POA: Diagnosis not present

## 2022-01-19 DIAGNOSIS — M1990 Unspecified osteoarthritis, unspecified site: Secondary | ICD-10-CM | POA: Diagnosis not present

## 2022-01-19 DIAGNOSIS — M6281 Muscle weakness (generalized): Secondary | ICD-10-CM | POA: Diagnosis not present

## 2022-01-19 DIAGNOSIS — N1832 Chronic kidney disease, stage 3b: Secondary | ICD-10-CM | POA: Diagnosis not present

## 2022-01-19 DIAGNOSIS — I129 Hypertensive chronic kidney disease with stage 1 through stage 4 chronic kidney disease, or unspecified chronic kidney disease: Secondary | ICD-10-CM | POA: Diagnosis not present

## 2022-01-20 DIAGNOSIS — R279 Unspecified lack of coordination: Secondary | ICD-10-CM | POA: Diagnosis not present

## 2022-01-20 DIAGNOSIS — N1832 Chronic kidney disease, stage 3b: Secondary | ICD-10-CM | POA: Diagnosis not present

## 2022-01-20 DIAGNOSIS — M6281 Muscle weakness (generalized): Secondary | ICD-10-CM | POA: Diagnosis not present

## 2022-01-20 DIAGNOSIS — I129 Hypertensive chronic kidney disease with stage 1 through stage 4 chronic kidney disease, or unspecified chronic kidney disease: Secondary | ICD-10-CM | POA: Diagnosis not present

## 2022-01-20 DIAGNOSIS — M1990 Unspecified osteoarthritis, unspecified site: Secondary | ICD-10-CM | POA: Diagnosis not present

## 2022-01-21 DIAGNOSIS — N1832 Chronic kidney disease, stage 3b: Secondary | ICD-10-CM | POA: Diagnosis not present

## 2022-01-21 DIAGNOSIS — M1990 Unspecified osteoarthritis, unspecified site: Secondary | ICD-10-CM | POA: Diagnosis not present

## 2022-01-21 DIAGNOSIS — M6281 Muscle weakness (generalized): Secondary | ICD-10-CM | POA: Diagnosis not present

## 2022-01-21 DIAGNOSIS — I129 Hypertensive chronic kidney disease with stage 1 through stage 4 chronic kidney disease, or unspecified chronic kidney disease: Secondary | ICD-10-CM | POA: Diagnosis not present

## 2022-01-21 DIAGNOSIS — R279 Unspecified lack of coordination: Secondary | ICD-10-CM | POA: Diagnosis not present

## 2022-01-22 DIAGNOSIS — M2550 Pain in unspecified joint: Secondary | ICD-10-CM | POA: Diagnosis not present

## 2022-01-22 DIAGNOSIS — M1990 Unspecified osteoarthritis, unspecified site: Secondary | ICD-10-CM | POA: Diagnosis not present

## 2022-01-22 DIAGNOSIS — N1832 Chronic kidney disease, stage 3b: Secondary | ICD-10-CM | POA: Diagnosis not present

## 2022-01-22 DIAGNOSIS — I129 Hypertensive chronic kidney disease with stage 1 through stage 4 chronic kidney disease, or unspecified chronic kidney disease: Secondary | ICD-10-CM | POA: Diagnosis not present

## 2022-01-22 DIAGNOSIS — R279 Unspecified lack of coordination: Secondary | ICD-10-CM | POA: Diagnosis not present

## 2022-01-22 DIAGNOSIS — H4010X2 Unspecified open-angle glaucoma, moderate stage: Secondary | ICD-10-CM | POA: Diagnosis not present

## 2022-01-22 DIAGNOSIS — M6281 Muscle weakness (generalized): Secondary | ICD-10-CM | POA: Diagnosis not present

## 2022-01-22 DIAGNOSIS — K5900 Constipation, unspecified: Secondary | ICD-10-CM | POA: Diagnosis not present

## 2022-01-25 DIAGNOSIS — M1990 Unspecified osteoarthritis, unspecified site: Secondary | ICD-10-CM | POA: Diagnosis not present

## 2022-01-25 DIAGNOSIS — M6281 Muscle weakness (generalized): Secondary | ICD-10-CM | POA: Diagnosis not present

## 2022-01-25 DIAGNOSIS — N1832 Chronic kidney disease, stage 3b: Secondary | ICD-10-CM | POA: Diagnosis not present

## 2022-01-25 DIAGNOSIS — I129 Hypertensive chronic kidney disease with stage 1 through stage 4 chronic kidney disease, or unspecified chronic kidney disease: Secondary | ICD-10-CM | POA: Diagnosis not present

## 2022-01-25 DIAGNOSIS — R279 Unspecified lack of coordination: Secondary | ICD-10-CM | POA: Diagnosis not present

## 2022-01-26 DIAGNOSIS — I129 Hypertensive chronic kidney disease with stage 1 through stage 4 chronic kidney disease, or unspecified chronic kidney disease: Secondary | ICD-10-CM | POA: Diagnosis not present

## 2022-01-26 DIAGNOSIS — R296 Repeated falls: Secondary | ICD-10-CM | POA: Diagnosis not present

## 2022-01-26 DIAGNOSIS — I130 Hypertensive heart and chronic kidney disease with heart failure and stage 1 through stage 4 chronic kidney disease, or unspecified chronic kidney disease: Secondary | ICD-10-CM | POA: Diagnosis not present

## 2022-01-26 DIAGNOSIS — M1990 Unspecified osteoarthritis, unspecified site: Secondary | ICD-10-CM | POA: Diagnosis not present

## 2022-01-26 DIAGNOSIS — M6281 Muscle weakness (generalized): Secondary | ICD-10-CM | POA: Diagnosis not present

## 2022-01-26 DIAGNOSIS — E785 Hyperlipidemia, unspecified: Secondary | ICD-10-CM | POA: Diagnosis not present

## 2022-01-26 DIAGNOSIS — R54 Age-related physical debility: Secondary | ICD-10-CM | POA: Diagnosis not present

## 2022-01-26 DIAGNOSIS — N1832 Chronic kidney disease, stage 3b: Secondary | ICD-10-CM | POA: Diagnosis not present

## 2022-01-26 DIAGNOSIS — R279 Unspecified lack of coordination: Secondary | ICD-10-CM | POA: Diagnosis not present

## 2022-01-27 DIAGNOSIS — M1990 Unspecified osteoarthritis, unspecified site: Secondary | ICD-10-CM | POA: Diagnosis not present

## 2022-01-27 DIAGNOSIS — M6281 Muscle weakness (generalized): Secondary | ICD-10-CM | POA: Diagnosis not present

## 2022-01-27 DIAGNOSIS — I129 Hypertensive chronic kidney disease with stage 1 through stage 4 chronic kidney disease, or unspecified chronic kidney disease: Secondary | ICD-10-CM | POA: Diagnosis not present

## 2022-01-27 DIAGNOSIS — R279 Unspecified lack of coordination: Secondary | ICD-10-CM | POA: Diagnosis not present

## 2022-01-27 DIAGNOSIS — N1832 Chronic kidney disease, stage 3b: Secondary | ICD-10-CM | POA: Diagnosis not present

## 2022-01-28 DIAGNOSIS — I129 Hypertensive chronic kidney disease with stage 1 through stage 4 chronic kidney disease, or unspecified chronic kidney disease: Secondary | ICD-10-CM | POA: Diagnosis not present

## 2022-01-28 DIAGNOSIS — M6281 Muscle weakness (generalized): Secondary | ICD-10-CM | POA: Diagnosis not present

## 2022-01-28 DIAGNOSIS — N1832 Chronic kidney disease, stage 3b: Secondary | ICD-10-CM | POA: Diagnosis not present

## 2022-01-28 DIAGNOSIS — R279 Unspecified lack of coordination: Secondary | ICD-10-CM | POA: Diagnosis not present

## 2022-01-28 DIAGNOSIS — M1990 Unspecified osteoarthritis, unspecified site: Secondary | ICD-10-CM | POA: Diagnosis not present

## 2022-01-29 DIAGNOSIS — N1832 Chronic kidney disease, stage 3b: Secondary | ICD-10-CM | POA: Diagnosis not present

## 2022-01-29 DIAGNOSIS — M6281 Muscle weakness (generalized): Secondary | ICD-10-CM | POA: Diagnosis not present

## 2022-01-29 DIAGNOSIS — I129 Hypertensive chronic kidney disease with stage 1 through stage 4 chronic kidney disease, or unspecified chronic kidney disease: Secondary | ICD-10-CM | POA: Diagnosis not present

## 2022-01-29 DIAGNOSIS — M1990 Unspecified osteoarthritis, unspecified site: Secondary | ICD-10-CM | POA: Diagnosis not present

## 2022-01-29 DIAGNOSIS — R279 Unspecified lack of coordination: Secondary | ICD-10-CM | POA: Diagnosis not present

## 2022-02-01 DIAGNOSIS — N1832 Chronic kidney disease, stage 3b: Secondary | ICD-10-CM | POA: Diagnosis not present

## 2022-02-01 DIAGNOSIS — R279 Unspecified lack of coordination: Secondary | ICD-10-CM | POA: Diagnosis not present

## 2022-02-01 DIAGNOSIS — M1990 Unspecified osteoarthritis, unspecified site: Secondary | ICD-10-CM | POA: Diagnosis not present

## 2022-02-01 DIAGNOSIS — M6281 Muscle weakness (generalized): Secondary | ICD-10-CM | POA: Diagnosis not present

## 2022-02-01 DIAGNOSIS — I129 Hypertensive chronic kidney disease with stage 1 through stage 4 chronic kidney disease, or unspecified chronic kidney disease: Secondary | ICD-10-CM | POA: Diagnosis not present

## 2022-02-02 DIAGNOSIS — R279 Unspecified lack of coordination: Secondary | ICD-10-CM | POA: Diagnosis not present

## 2022-02-02 DIAGNOSIS — M1990 Unspecified osteoarthritis, unspecified site: Secondary | ICD-10-CM | POA: Diagnosis not present

## 2022-02-02 DIAGNOSIS — N1832 Chronic kidney disease, stage 3b: Secondary | ICD-10-CM | POA: Diagnosis not present

## 2022-02-02 DIAGNOSIS — I129 Hypertensive chronic kidney disease with stage 1 through stage 4 chronic kidney disease, or unspecified chronic kidney disease: Secondary | ICD-10-CM | POA: Diagnosis not present

## 2022-02-02 DIAGNOSIS — M6281 Muscle weakness (generalized): Secondary | ICD-10-CM | POA: Diagnosis not present

## 2022-02-03 DIAGNOSIS — I129 Hypertensive chronic kidney disease with stage 1 through stage 4 chronic kidney disease, or unspecified chronic kidney disease: Secondary | ICD-10-CM | POA: Diagnosis not present

## 2022-02-03 DIAGNOSIS — N1832 Chronic kidney disease, stage 3b: Secondary | ICD-10-CM | POA: Diagnosis not present

## 2022-02-03 DIAGNOSIS — M6281 Muscle weakness (generalized): Secondary | ICD-10-CM | POA: Diagnosis not present

## 2022-02-03 DIAGNOSIS — R279 Unspecified lack of coordination: Secondary | ICD-10-CM | POA: Diagnosis not present

## 2022-02-03 DIAGNOSIS — M1990 Unspecified osteoarthritis, unspecified site: Secondary | ICD-10-CM | POA: Diagnosis not present

## 2022-02-04 DIAGNOSIS — N1832 Chronic kidney disease, stage 3b: Secondary | ICD-10-CM | POA: Diagnosis not present

## 2022-02-04 DIAGNOSIS — M1990 Unspecified osteoarthritis, unspecified site: Secondary | ICD-10-CM | POA: Diagnosis not present

## 2022-02-04 DIAGNOSIS — M6281 Muscle weakness (generalized): Secondary | ICD-10-CM | POA: Diagnosis not present

## 2022-02-04 DIAGNOSIS — R279 Unspecified lack of coordination: Secondary | ICD-10-CM | POA: Diagnosis not present

## 2022-02-04 DIAGNOSIS — I129 Hypertensive chronic kidney disease with stage 1 through stage 4 chronic kidney disease, or unspecified chronic kidney disease: Secondary | ICD-10-CM | POA: Diagnosis not present

## 2022-02-05 DIAGNOSIS — M6281 Muscle weakness (generalized): Secondary | ICD-10-CM | POA: Diagnosis not present

## 2022-02-05 DIAGNOSIS — R279 Unspecified lack of coordination: Secondary | ICD-10-CM | POA: Diagnosis not present

## 2022-02-05 DIAGNOSIS — M1990 Unspecified osteoarthritis, unspecified site: Secondary | ICD-10-CM | POA: Diagnosis not present

## 2022-02-05 DIAGNOSIS — I129 Hypertensive chronic kidney disease with stage 1 through stage 4 chronic kidney disease, or unspecified chronic kidney disease: Secondary | ICD-10-CM | POA: Diagnosis not present

## 2022-02-05 DIAGNOSIS — N1832 Chronic kidney disease, stage 3b: Secondary | ICD-10-CM | POA: Diagnosis not present

## 2022-02-08 DIAGNOSIS — M6281 Muscle weakness (generalized): Secondary | ICD-10-CM | POA: Diagnosis not present

## 2022-02-08 DIAGNOSIS — R279 Unspecified lack of coordination: Secondary | ICD-10-CM | POA: Diagnosis not present

## 2022-02-08 DIAGNOSIS — N1832 Chronic kidney disease, stage 3b: Secondary | ICD-10-CM | POA: Diagnosis not present

## 2022-02-08 DIAGNOSIS — M1990 Unspecified osteoarthritis, unspecified site: Secondary | ICD-10-CM | POA: Diagnosis not present

## 2022-02-08 DIAGNOSIS — I129 Hypertensive chronic kidney disease with stage 1 through stage 4 chronic kidney disease, or unspecified chronic kidney disease: Secondary | ICD-10-CM | POA: Diagnosis not present

## 2022-02-19 DIAGNOSIS — I4891 Unspecified atrial fibrillation: Secondary | ICD-10-CM | POA: Diagnosis not present

## 2022-02-19 DIAGNOSIS — E785 Hyperlipidemia, unspecified: Secondary | ICD-10-CM | POA: Diagnosis not present

## 2022-02-19 DIAGNOSIS — I130 Hypertensive heart and chronic kidney disease with heart failure and stage 1 through stage 4 chronic kidney disease, or unspecified chronic kidney disease: Secondary | ICD-10-CM | POA: Diagnosis not present

## 2022-02-20 DIAGNOSIS — E119 Type 2 diabetes mellitus without complications: Secondary | ICD-10-CM | POA: Diagnosis not present

## 2022-02-20 DIAGNOSIS — E559 Vitamin D deficiency, unspecified: Secondary | ICD-10-CM | POA: Diagnosis not present

## 2022-02-20 DIAGNOSIS — I10 Essential (primary) hypertension: Secondary | ICD-10-CM | POA: Diagnosis not present

## 2022-02-20 DIAGNOSIS — E550 Rickets, active: Secondary | ICD-10-CM | POA: Diagnosis not present

## 2022-02-20 DIAGNOSIS — Z79899 Other long term (current) drug therapy: Secondary | ICD-10-CM | POA: Diagnosis not present

## 2022-02-22 DIAGNOSIS — E785 Hyperlipidemia, unspecified: Secondary | ICD-10-CM | POA: Diagnosis not present

## 2022-02-22 DIAGNOSIS — M6281 Muscle weakness (generalized): Secondary | ICD-10-CM | POA: Diagnosis not present

## 2022-02-22 DIAGNOSIS — D464 Refractory anemia, unspecified: Secondary | ICD-10-CM | POA: Diagnosis not present

## 2022-03-04 DIAGNOSIS — E119 Type 2 diabetes mellitus without complications: Secondary | ICD-10-CM | POA: Diagnosis not present

## 2022-03-09 DIAGNOSIS — Z23 Encounter for immunization: Secondary | ICD-10-CM | POA: Diagnosis not present

## 2022-04-22 DIAGNOSIS — I130 Hypertensive heart and chronic kidney disease with heart failure and stage 1 through stage 4 chronic kidney disease, or unspecified chronic kidney disease: Secondary | ICD-10-CM | POA: Diagnosis not present

## 2022-04-22 DIAGNOSIS — M6281 Muscle weakness (generalized): Secondary | ICD-10-CM | POA: Diagnosis not present

## 2022-04-22 DIAGNOSIS — D464 Refractory anemia, unspecified: Secondary | ICD-10-CM | POA: Diagnosis not present

## 2022-04-22 DIAGNOSIS — E785 Hyperlipidemia, unspecified: Secondary | ICD-10-CM | POA: Diagnosis not present

## 2022-04-29 DIAGNOSIS — R1312 Dysphagia, oropharyngeal phase: Secondary | ICD-10-CM | POA: Diagnosis not present

## 2022-04-29 DIAGNOSIS — M6281 Muscle weakness (generalized): Secondary | ICD-10-CM | POA: Diagnosis not present

## 2022-04-29 DIAGNOSIS — R2689 Other abnormalities of gait and mobility: Secondary | ICD-10-CM | POA: Diagnosis not present

## 2022-04-29 DIAGNOSIS — I129 Hypertensive chronic kidney disease with stage 1 through stage 4 chronic kidney disease, or unspecified chronic kidney disease: Secondary | ICD-10-CM | POA: Diagnosis not present

## 2022-04-29 DIAGNOSIS — R279 Unspecified lack of coordination: Secondary | ICD-10-CM | POA: Diagnosis not present

## 2022-04-29 DIAGNOSIS — M1990 Unspecified osteoarthritis, unspecified site: Secondary | ICD-10-CM | POA: Diagnosis not present

## 2022-05-03 DIAGNOSIS — I129 Hypertensive chronic kidney disease with stage 1 through stage 4 chronic kidney disease, or unspecified chronic kidney disease: Secondary | ICD-10-CM | POA: Diagnosis not present

## 2022-05-03 DIAGNOSIS — R1312 Dysphagia, oropharyngeal phase: Secondary | ICD-10-CM | POA: Diagnosis not present

## 2022-05-03 DIAGNOSIS — M1990 Unspecified osteoarthritis, unspecified site: Secondary | ICD-10-CM | POA: Diagnosis not present

## 2022-05-03 DIAGNOSIS — M6281 Muscle weakness (generalized): Secondary | ICD-10-CM | POA: Diagnosis not present

## 2022-05-03 DIAGNOSIS — R279 Unspecified lack of coordination: Secondary | ICD-10-CM | POA: Diagnosis not present

## 2022-05-03 DIAGNOSIS — R2689 Other abnormalities of gait and mobility: Secondary | ICD-10-CM | POA: Diagnosis not present

## 2022-05-04 DIAGNOSIS — M1990 Unspecified osteoarthritis, unspecified site: Secondary | ICD-10-CM | POA: Diagnosis not present

## 2022-05-04 DIAGNOSIS — M6281 Muscle weakness (generalized): Secondary | ICD-10-CM | POA: Diagnosis not present

## 2022-05-04 DIAGNOSIS — R2689 Other abnormalities of gait and mobility: Secondary | ICD-10-CM | POA: Diagnosis not present

## 2022-05-04 DIAGNOSIS — R1312 Dysphagia, oropharyngeal phase: Secondary | ICD-10-CM | POA: Diagnosis not present

## 2022-05-04 DIAGNOSIS — I129 Hypertensive chronic kidney disease with stage 1 through stage 4 chronic kidney disease, or unspecified chronic kidney disease: Secondary | ICD-10-CM | POA: Diagnosis not present

## 2022-05-04 DIAGNOSIS — R279 Unspecified lack of coordination: Secondary | ICD-10-CM | POA: Diagnosis not present

## 2022-05-05 DIAGNOSIS — I129 Hypertensive chronic kidney disease with stage 1 through stage 4 chronic kidney disease, or unspecified chronic kidney disease: Secondary | ICD-10-CM | POA: Diagnosis not present

## 2022-05-05 DIAGNOSIS — R1312 Dysphagia, oropharyngeal phase: Secondary | ICD-10-CM | POA: Diagnosis not present

## 2022-05-05 DIAGNOSIS — R2689 Other abnormalities of gait and mobility: Secondary | ICD-10-CM | POA: Diagnosis not present

## 2022-05-05 DIAGNOSIS — R279 Unspecified lack of coordination: Secondary | ICD-10-CM | POA: Diagnosis not present

## 2022-05-05 DIAGNOSIS — M6281 Muscle weakness (generalized): Secondary | ICD-10-CM | POA: Diagnosis not present

## 2022-05-05 DIAGNOSIS — M1990 Unspecified osteoarthritis, unspecified site: Secondary | ICD-10-CM | POA: Diagnosis not present

## 2022-05-06 DIAGNOSIS — M1990 Unspecified osteoarthritis, unspecified site: Secondary | ICD-10-CM | POA: Diagnosis not present

## 2022-05-06 DIAGNOSIS — M6281 Muscle weakness (generalized): Secondary | ICD-10-CM | POA: Diagnosis not present

## 2022-05-06 DIAGNOSIS — R2689 Other abnormalities of gait and mobility: Secondary | ICD-10-CM | POA: Diagnosis not present

## 2022-05-06 DIAGNOSIS — I129 Hypertensive chronic kidney disease with stage 1 through stage 4 chronic kidney disease, or unspecified chronic kidney disease: Secondary | ICD-10-CM | POA: Diagnosis not present

## 2022-05-06 DIAGNOSIS — R1312 Dysphagia, oropharyngeal phase: Secondary | ICD-10-CM | POA: Diagnosis not present

## 2022-05-06 DIAGNOSIS — R279 Unspecified lack of coordination: Secondary | ICD-10-CM | POA: Diagnosis not present

## 2022-05-07 DIAGNOSIS — M6281 Muscle weakness (generalized): Secondary | ICD-10-CM | POA: Diagnosis not present

## 2022-05-07 DIAGNOSIS — R1312 Dysphagia, oropharyngeal phase: Secondary | ICD-10-CM | POA: Diagnosis not present

## 2022-05-07 DIAGNOSIS — M1990 Unspecified osteoarthritis, unspecified site: Secondary | ICD-10-CM | POA: Diagnosis not present

## 2022-05-07 DIAGNOSIS — R279 Unspecified lack of coordination: Secondary | ICD-10-CM | POA: Diagnosis not present

## 2022-05-07 DIAGNOSIS — R2689 Other abnormalities of gait and mobility: Secondary | ICD-10-CM | POA: Diagnosis not present

## 2022-05-07 DIAGNOSIS — I129 Hypertensive chronic kidney disease with stage 1 through stage 4 chronic kidney disease, or unspecified chronic kidney disease: Secondary | ICD-10-CM | POA: Diagnosis not present

## 2022-05-08 DIAGNOSIS — M6281 Muscle weakness (generalized): Secondary | ICD-10-CM | POA: Diagnosis not present

## 2022-05-08 DIAGNOSIS — R2689 Other abnormalities of gait and mobility: Secondary | ICD-10-CM | POA: Diagnosis not present

## 2022-05-08 DIAGNOSIS — I129 Hypertensive chronic kidney disease with stage 1 through stage 4 chronic kidney disease, or unspecified chronic kidney disease: Secondary | ICD-10-CM | POA: Diagnosis not present

## 2022-05-08 DIAGNOSIS — M1990 Unspecified osteoarthritis, unspecified site: Secondary | ICD-10-CM | POA: Diagnosis not present

## 2022-05-08 DIAGNOSIS — R279 Unspecified lack of coordination: Secondary | ICD-10-CM | POA: Diagnosis not present

## 2022-05-08 DIAGNOSIS — R1312 Dysphagia, oropharyngeal phase: Secondary | ICD-10-CM | POA: Diagnosis not present

## 2022-05-10 DIAGNOSIS — R2689 Other abnormalities of gait and mobility: Secondary | ICD-10-CM | POA: Diagnosis not present

## 2022-05-10 DIAGNOSIS — M6281 Muscle weakness (generalized): Secondary | ICD-10-CM | POA: Diagnosis not present

## 2022-05-10 DIAGNOSIS — R279 Unspecified lack of coordination: Secondary | ICD-10-CM | POA: Diagnosis not present

## 2022-05-10 DIAGNOSIS — I129 Hypertensive chronic kidney disease with stage 1 through stage 4 chronic kidney disease, or unspecified chronic kidney disease: Secondary | ICD-10-CM | POA: Diagnosis not present

## 2022-05-10 DIAGNOSIS — M1990 Unspecified osteoarthritis, unspecified site: Secondary | ICD-10-CM | POA: Diagnosis not present

## 2022-05-10 DIAGNOSIS — R1312 Dysphagia, oropharyngeal phase: Secondary | ICD-10-CM | POA: Diagnosis not present

## 2022-05-11 DIAGNOSIS — I129 Hypertensive chronic kidney disease with stage 1 through stage 4 chronic kidney disease, or unspecified chronic kidney disease: Secondary | ICD-10-CM | POA: Diagnosis not present

## 2022-05-11 DIAGNOSIS — M6281 Muscle weakness (generalized): Secondary | ICD-10-CM | POA: Diagnosis not present

## 2022-05-11 DIAGNOSIS — M1990 Unspecified osteoarthritis, unspecified site: Secondary | ICD-10-CM | POA: Diagnosis not present

## 2022-05-11 DIAGNOSIS — R2689 Other abnormalities of gait and mobility: Secondary | ICD-10-CM | POA: Diagnosis not present

## 2022-05-11 DIAGNOSIS — R279 Unspecified lack of coordination: Secondary | ICD-10-CM | POA: Diagnosis not present

## 2022-05-11 DIAGNOSIS — R1312 Dysphagia, oropharyngeal phase: Secondary | ICD-10-CM | POA: Diagnosis not present

## 2022-05-12 DIAGNOSIS — R2689 Other abnormalities of gait and mobility: Secondary | ICD-10-CM | POA: Diagnosis not present

## 2022-05-12 DIAGNOSIS — R1312 Dysphagia, oropharyngeal phase: Secondary | ICD-10-CM | POA: Diagnosis not present

## 2022-05-12 DIAGNOSIS — R279 Unspecified lack of coordination: Secondary | ICD-10-CM | POA: Diagnosis not present

## 2022-05-12 DIAGNOSIS — M1990 Unspecified osteoarthritis, unspecified site: Secondary | ICD-10-CM | POA: Diagnosis not present

## 2022-05-12 DIAGNOSIS — M6281 Muscle weakness (generalized): Secondary | ICD-10-CM | POA: Diagnosis not present

## 2022-05-12 DIAGNOSIS — I129 Hypertensive chronic kidney disease with stage 1 through stage 4 chronic kidney disease, or unspecified chronic kidney disease: Secondary | ICD-10-CM | POA: Diagnosis not present

## 2022-05-13 DIAGNOSIS — R1312 Dysphagia, oropharyngeal phase: Secondary | ICD-10-CM | POA: Diagnosis not present

## 2022-05-13 DIAGNOSIS — M1990 Unspecified osteoarthritis, unspecified site: Secondary | ICD-10-CM | POA: Diagnosis not present

## 2022-05-13 DIAGNOSIS — R279 Unspecified lack of coordination: Secondary | ICD-10-CM | POA: Diagnosis not present

## 2022-05-13 DIAGNOSIS — M6281 Muscle weakness (generalized): Secondary | ICD-10-CM | POA: Diagnosis not present

## 2022-05-13 DIAGNOSIS — I129 Hypertensive chronic kidney disease with stage 1 through stage 4 chronic kidney disease, or unspecified chronic kidney disease: Secondary | ICD-10-CM | POA: Diagnosis not present

## 2022-05-13 DIAGNOSIS — R2689 Other abnormalities of gait and mobility: Secondary | ICD-10-CM | POA: Diagnosis not present

## 2022-05-14 DIAGNOSIS — M1990 Unspecified osteoarthritis, unspecified site: Secondary | ICD-10-CM | POA: Diagnosis not present

## 2022-05-14 DIAGNOSIS — I129 Hypertensive chronic kidney disease with stage 1 through stage 4 chronic kidney disease, or unspecified chronic kidney disease: Secondary | ICD-10-CM | POA: Diagnosis not present

## 2022-05-14 DIAGNOSIS — R2689 Other abnormalities of gait and mobility: Secondary | ICD-10-CM | POA: Diagnosis not present

## 2022-05-14 DIAGNOSIS — M6281 Muscle weakness (generalized): Secondary | ICD-10-CM | POA: Diagnosis not present

## 2022-05-14 DIAGNOSIS — R1312 Dysphagia, oropharyngeal phase: Secondary | ICD-10-CM | POA: Diagnosis not present

## 2022-05-14 DIAGNOSIS — R279 Unspecified lack of coordination: Secondary | ICD-10-CM | POA: Diagnosis not present

## 2022-05-15 DIAGNOSIS — I129 Hypertensive chronic kidney disease with stage 1 through stage 4 chronic kidney disease, or unspecified chronic kidney disease: Secondary | ICD-10-CM | POA: Diagnosis not present

## 2022-05-15 DIAGNOSIS — R279 Unspecified lack of coordination: Secondary | ICD-10-CM | POA: Diagnosis not present

## 2022-05-15 DIAGNOSIS — R2689 Other abnormalities of gait and mobility: Secondary | ICD-10-CM | POA: Diagnosis not present

## 2022-05-15 DIAGNOSIS — R1312 Dysphagia, oropharyngeal phase: Secondary | ICD-10-CM | POA: Diagnosis not present

## 2022-05-15 DIAGNOSIS — M1990 Unspecified osteoarthritis, unspecified site: Secondary | ICD-10-CM | POA: Diagnosis not present

## 2022-05-15 DIAGNOSIS — M6281 Muscle weakness (generalized): Secondary | ICD-10-CM | POA: Diagnosis not present

## 2023-03-18 ENCOUNTER — Inpatient Hospital Stay (HOSPITAL_COMMUNITY)
Admission: EM | Admit: 2023-03-18 | Discharge: 2023-03-22 | DRG: 660 | Disposition: A | Discharge status: Skilled Nursing Facility | Payer: Medicare Other | Source: Skilled Nursing Facility | Attending: Internal Medicine | Admitting: Internal Medicine

## 2023-03-18 ENCOUNTER — Other Ambulatory Visit: Payer: Self-pay

## 2023-03-18 ENCOUNTER — Emergency Department (HOSPITAL_COMMUNITY): Payer: Medicare Other

## 2023-03-18 ENCOUNTER — Encounter (HOSPITAL_COMMUNITY): Payer: Self-pay

## 2023-03-18 DIAGNOSIS — B964 Proteus (mirabilis) (morganii) as the cause of diseases classified elsewhere: Secondary | ICD-10-CM | POA: Diagnosis present

## 2023-03-18 DIAGNOSIS — N390 Urinary tract infection, site not specified: Secondary | ICD-10-CM | POA: Diagnosis not present

## 2023-03-18 DIAGNOSIS — Z8249 Family history of ischemic heart disease and other diseases of the circulatory system: Secondary | ICD-10-CM

## 2023-03-18 DIAGNOSIS — Z7982 Long term (current) use of aspirin: Secondary | ICD-10-CM

## 2023-03-18 DIAGNOSIS — R4189 Other symptoms and signs involving cognitive functions and awareness: Secondary | ICD-10-CM | POA: Diagnosis present

## 2023-03-18 DIAGNOSIS — Z841 Family history of disorders of kidney and ureter: Secondary | ICD-10-CM

## 2023-03-18 DIAGNOSIS — R5381 Other malaise: Secondary | ICD-10-CM | POA: Diagnosis present

## 2023-03-18 DIAGNOSIS — K869 Disease of pancreas, unspecified: Secondary | ICD-10-CM | POA: Diagnosis present

## 2023-03-18 DIAGNOSIS — Z8673 Personal history of transient ischemic attack (TIA), and cerebral infarction without residual deficits: Secondary | ICD-10-CM

## 2023-03-18 DIAGNOSIS — Z888 Allergy status to other drugs, medicaments and biological substances status: Secondary | ICD-10-CM

## 2023-03-18 DIAGNOSIS — Z79899 Other long term (current) drug therapy: Secondary | ICD-10-CM

## 2023-03-18 DIAGNOSIS — I480 Paroxysmal atrial fibrillation: Secondary | ICD-10-CM | POA: Diagnosis present

## 2023-03-18 DIAGNOSIS — K8689 Other specified diseases of pancreas: Secondary | ICD-10-CM | POA: Diagnosis present

## 2023-03-18 DIAGNOSIS — I129 Hypertensive chronic kidney disease with stage 1 through stage 4 chronic kidney disease, or unspecified chronic kidney disease: Secondary | ICD-10-CM | POA: Diagnosis present

## 2023-03-18 DIAGNOSIS — N201 Calculus of ureter: Secondary | ICD-10-CM | POA: Diagnosis not present

## 2023-03-18 DIAGNOSIS — R001 Bradycardia, unspecified: Secondary | ICD-10-CM | POA: Diagnosis present

## 2023-03-18 DIAGNOSIS — Z8042 Family history of malignant neoplasm of prostate: Secondary | ICD-10-CM

## 2023-03-18 DIAGNOSIS — Z800 Family history of malignant neoplasm of digestive organs: Secondary | ICD-10-CM

## 2023-03-18 DIAGNOSIS — R410 Disorientation, unspecified: Secondary | ICD-10-CM

## 2023-03-18 DIAGNOSIS — E785 Hyperlipidemia, unspecified: Secondary | ICD-10-CM | POA: Diagnosis present

## 2023-03-18 DIAGNOSIS — N179 Acute kidney failure, unspecified: Secondary | ICD-10-CM | POA: Diagnosis not present

## 2023-03-18 DIAGNOSIS — N12 Tubulo-interstitial nephritis, not specified as acute or chronic: Secondary | ICD-10-CM

## 2023-03-18 DIAGNOSIS — N1831 Chronic kidney disease, stage 3a: Secondary | ICD-10-CM | POA: Diagnosis present

## 2023-03-18 DIAGNOSIS — R809 Proteinuria, unspecified: Secondary | ICD-10-CM | POA: Diagnosis present

## 2023-03-18 DIAGNOSIS — Z96651 Presence of right artificial knee joint: Secondary | ICD-10-CM | POA: Diagnosis present

## 2023-03-18 DIAGNOSIS — Z818 Family history of other mental and behavioral disorders: Secondary | ICD-10-CM

## 2023-03-18 DIAGNOSIS — B9689 Other specified bacterial agents as the cause of diseases classified elsewhere: Secondary | ICD-10-CM | POA: Diagnosis present

## 2023-03-18 DIAGNOSIS — Z9181 History of falling: Secondary | ICD-10-CM

## 2023-03-18 DIAGNOSIS — Q2112 Patent foramen ovale: Secondary | ICD-10-CM

## 2023-03-18 DIAGNOSIS — I4891 Unspecified atrial fibrillation: Secondary | ICD-10-CM | POA: Diagnosis present

## 2023-03-18 DIAGNOSIS — N200 Calculus of kidney: Principal | ICD-10-CM

## 2023-03-18 DIAGNOSIS — R112 Nausea with vomiting, unspecified: Secondary | ICD-10-CM | POA: Diagnosis present

## 2023-03-18 DIAGNOSIS — N136 Pyonephrosis: Principal | ICD-10-CM | POA: Diagnosis present

## 2023-03-18 DIAGNOSIS — D696 Thrombocytopenia, unspecified: Secondary | ICD-10-CM | POA: Diagnosis present

## 2023-03-18 DIAGNOSIS — R7881 Bacteremia: Secondary | ICD-10-CM | POA: Diagnosis present

## 2023-03-18 LAB — CBC
HCT: 37.5 % (ref 36.0–46.0)
Hemoglobin: 11.2 g/dL — ABNORMAL LOW (ref 12.0–15.0)
MCH: 31 pg (ref 26.0–34.0)
MCHC: 29.9 g/dL — ABNORMAL LOW (ref 30.0–36.0)
MCV: 103.9 fL — ABNORMAL HIGH (ref 80.0–100.0)
Platelets: 142 10*3/uL — ABNORMAL LOW (ref 150–400)
RBC: 3.61 MIL/uL — ABNORMAL LOW (ref 3.87–5.11)
RDW: 13.4 % (ref 11.5–15.5)
WBC: 12.5 10*3/uL — ABNORMAL HIGH (ref 4.0–10.5)
nRBC: 0 % (ref 0.0–0.2)

## 2023-03-18 LAB — URINALYSIS, ROUTINE W REFLEX MICROSCOPIC
Glucose, UA: NEGATIVE mg/dL
Ketones, ur: NEGATIVE mg/dL
Nitrite: POSITIVE — AB
Protein, ur: >300 mg/dL — AB
Specific Gravity, Urine: 1.015 (ref 1.005–1.030)
pH: 8.5 — ABNORMAL HIGH (ref 5.0–8.0)

## 2023-03-18 LAB — COMPREHENSIVE METABOLIC PANEL
ALT: 16 U/L (ref 0–44)
AST: 33 U/L (ref 15–41)
Albumin: 2.4 g/dL — ABNORMAL LOW (ref 3.5–5.0)
Alkaline Phosphatase: 72 U/L (ref 38–126)
Anion gap: 11 (ref 5–15)
BUN: 29 mg/dL — ABNORMAL HIGH (ref 8–23)
CO2: 22 mmol/L (ref 22–32)
Calcium: 9.3 mg/dL (ref 8.9–10.3)
Chloride: 103 mmol/L (ref 98–111)
Creatinine, Ser: 1.63 mg/dL — ABNORMAL HIGH (ref 0.44–1.00)
GFR, Estimated: 29 mL/min — ABNORMAL LOW (ref >60)
Glucose, Bld: 180 mg/dL — ABNORMAL HIGH (ref 70–99)
Potassium: 4.9 mmol/L (ref 3.5–5.1)
Sodium: 136 mmol/L (ref 135–145)
Total Bilirubin: 1.4 mg/dL — ABNORMAL HIGH (ref 0.3–1.2)
Total Protein: 5.8 g/dL — ABNORMAL LOW (ref 6.5–8.1)

## 2023-03-18 LAB — URINALYSIS, MICROSCOPIC (REFLEX): WBC, UA: >50 WBC/hpf (ref 0–5)

## 2023-03-18 LAB — CBG MONITORING, ED: Glucose-Capillary: 154 mg/dL — ABNORMAL HIGH (ref 70–99)

## 2023-03-18 MED ORDER — IOHEXOL 350 MG/ML SOLN
75.0000 mL | Freq: Once | INTRAVENOUS | Status: AC | PRN
  Administered 2023-03-18: 60 mL via INTRAVENOUS

## 2023-03-18 MED ORDER — ONDANSETRON HCL 4 MG/2ML IJ SOLN: 4.0000 mg | Freq: Once | INTRAMUSCULAR | Status: DC

## 2023-03-18 MED ORDER — SODIUM CHLORIDE 0.9 % IV SOLN
1.0000 g | Freq: Once | INTRAVENOUS | Status: AC
  Administered 2023-03-19: 1 g via INTRAVENOUS
  Filled 2023-03-18: qty 10

## 2023-03-18 MED ORDER — SODIUM CHLORIDE 0.9 % IV BOLUS: 500.0000 mL | Freq: Once | INTRAVENOUS | Status: DC

## 2023-03-18 MED ORDER — SODIUM CHLORIDE 0.9 % IV BOLUS
1000.0000 mL | Freq: Once | INTRAVENOUS | Status: AC
  Administered 2023-03-19: 1000 mL via INTRAVENOUS

## 2023-03-18 NOTE — ED Provider Notes (Signed)
[place] EMERGENCY DEPARTMENT AT [facility] Provider Note   CSN: [id number] Arrival date & time: 03/18/23  2045     History {Add pertinent medical, surgical, social history, OB history to HPI:1} Chief Complaint  Patient presents with   Altered Mental Status    Holly Nunez is a 89 y.o. female.  89 year old female with history of hypertension and hyperlipidemia who presents to the emergency department with nausea and vomiting altered mental status. Discussed with [facility] staff. Threw up twice last night. Thinks it was spaghetti that she ate. Given zofran and antacid. Slept well last night after. This morning she was given more zofran for nausea. Was given milk magnesia. Daughter was with her and thought her mom was off but staff thought that she was actually at baseline. Dr who was on call told her to come to the hospital. Typically is AAOx1 (knows family too).  Per daughter, has been having vomiting yesterday and today.  Went to see her and she seemed more drowsy than usual.  Has not been started on any new medications.  Only has been given Zofran.  Per daughter is typically alert and oriented x 3.       Home Medications Prior to Admission medications   Medication Sig Start Date End Date Taking? Authorizing Provider  acetaminophen (TYLENOL) 650 MG CR tablet Take 2 tablets (1,300 mg total) by mouth every 6 (six) hours as needed for pain (for mild pain). 05/15/21   Calvert Cantor, MD  acetaminophen (TYLENOL) 650 MG CR tablet Take 1,300 mg by mouth every 8 (eight) hours as needed for pain.    [provider]  atorvastatin (LIPITOR) 40 MG tablet TAKE 1 TABLET(40 MG) BY MOUTH DAILY Patient taking differently: Take 40 mg by mouth daily. 05/06/21   Bradd Canary, MD  carvedilol (COREG) 3.125 MG tablet TAKE 2 TABLETS(6.25 MG) BY MOUTH TWICE DAILY 05/28/21   Bradd Canary, MD  famotidine (PEPCID) 20 MG tablet Take 20 mg by mouth at bedtime as needed for heartburn or  indigestion.    [provider]  Multiple Vitamin (MULTIVITAMIN) tablet Take 1 tablet by mouth daily.    [provider]  nitroGLYCERIN (NITROSTAT) 0.4 MG SL tablet Place 1 tablet (0.4 mg total) under the tongue every 5 (five) minutes as needed for chest pain. 10/01/20 06/27/21  Lewayne Bunting, MD  Omega-3 Fatty Acids (FISH OIL PO) Take 1 capsule by mouth daily.    [provider]  oxybutynin (DITROPAN-XL) 10 MG 24 hr tablet TAKE 1 TABLET(10 MG) BY MOUTH AT BEDTIME 09/14/21   Bradd Canary, MD  Probiotic Product (PROBIOTIC PO) Take 1 tablet by mouth daily.    [provider]  Simethicone (GAS-X PO) Take 1 tablet by mouth at bedtime as needed (heartburn/gas).    [provider]  timolol (TIMOPTIC) 0.5 % ophthalmic solution Place 1 drop into both eyes at bedtime. 06/14/17   [provider]  Trolamine Salicylate (ASPERCREME EX) Apply 1 application topically 3 (three) times daily as needed (pain).    [provider]      Allergies    Alphagan [brimonidine]    Review of Systems   Review of Systems  Physical Exam Updated Vital Signs BP (!) 93/50   Pulse (!) 53   Temp 97.8 F (36.6 C) (Oral)   Resp 16   SpO2 100%  Physical Exam Vitals and nursing note reviewed.  Constitutional:      General: She is  not in acute distress.    Appearance: She is well-developed.  HENT:     Head: Normocephalic and atraumatic.     Right Ear: External ear normal.     Left Ear: External ear normal.     Nose: Nose normal.  Eyes:     Extraocular Movements: Extraocular movements intact.     Conjunctiva/sclera: Conjunctivae normal.     Pupils: Pupils are equal, round, and reactive to light.  Cardiovascular:     Rate and Rhythm: Normal rate and regular rhythm.     Heart sounds: No murmur heard. Pulmonary:     Effort: Pulmonary effort is normal. No respiratory distress.     Breath sounds: Normal breath sounds.  Abdominal:     General: Abdomen is  flat. There is no distension.     Palpations: Abdomen is soft. There is no mass.     Tenderness: There is abdominal tenderness (Diffuse). There is no guarding.  Musculoskeletal:     Cervical back: Normal range of motion and neck supple.     Right lower leg: No edema.     Left lower leg: No edema.  Skin:    General: Skin is warm and dry.  Neurological:     Mental Status: She is alert and oriented to person, place, and time. Mental status is at baseline.     Cranial Nerves: No cranial nerve deficit.     Sensory: No sensory deficit.     Motor: No weakness.  Psychiatric:        Mood and Affect: Mood normal.     ED Results / Procedures / Treatments   Labs (all labs ordered are listed, but only abnormal results are displayed) Labs Reviewed  COMPREHENSIVE METABOLIC PANEL  CBC  CBG MONITORING, ED    EKG None  Radiology No results found.  Procedures Procedures  {Document cardiac monitor, telemetry assessment procedure when appropriate:1}  Medications Ordered in ED Medications - No data to display  ED Course/ Medical Decision Making/ A&P   {   Click here for ABCD2, HEART and other calculatorsREFRESH Note before signing :1}                              Medical Decision Making Amount and/or Complexity of Data Reviewed Labs: ordered. Radiology: ordered.  Risk Prescription drug management.   ***  {Document critical care time when appropriate:1} {Document review of labs and clinical decision tools ie heart score, Chads2Vasc2 etc:1}  {Document your independent review of radiology images, and any outside records:1} {Document your discussion with family members, caretakers, and with consultants:1} {Document social determinants of health affecting pt's care:1} {Document your decision making why or why not admission, treatments were needed:1} Final Clinical Impression(s) / ED Diagnoses Final diagnoses:  None    Rx / DC Orders ED Discharge Orders     None

## 2023-03-18 NOTE — ED Triage Notes (Signed)
Pt from [facility].

## 2023-03-18 NOTE — ED Triage Notes (Signed)
Pt presents via EMS c/o intermittent altered mental status. EMS reports pt more alert with EMS prior to arrival.

## 2023-03-18 NOTE — ED Notes (Signed)
Patient returned to room from CT. 

## 2023-03-18 NOTE — ED Notes (Signed)
URINE IS DARK AND THICK IN TEXTURE. APPEARANCE ALMOST LIKE THIN APPLE SAUCE.

## 2023-03-18 NOTE — ED Notes (Signed)
Patient transported to CT 

## 2023-03-19 ENCOUNTER — Inpatient Hospital Stay (HOSPITAL_COMMUNITY): Payer: Medicare Other | Admitting: Certified Registered Nurse Anesthetist

## 2023-03-19 ENCOUNTER — Inpatient Hospital Stay (HOSPITAL_COMMUNITY): Payer: Medicare Other

## 2023-03-19 ENCOUNTER — Encounter (HOSPITAL_COMMUNITY): Payer: Self-pay | Admitting: Family Medicine

## 2023-03-19 ENCOUNTER — Encounter (HOSPITAL_COMMUNITY)
Admission: EM | Disposition: A | Discharge status: Skilled Nursing Facility | Payer: Self-pay | Source: Skilled Nursing Facility | Attending: Internal Medicine

## 2023-03-19 DIAGNOSIS — N201 Calculus of ureter: Secondary | ICD-10-CM

## 2023-03-19 DIAGNOSIS — Z9181 History of falling: Secondary | ICD-10-CM | POA: Diagnosis not present

## 2023-03-19 DIAGNOSIS — Z79899 Other long term (current) drug therapy: Secondary | ICD-10-CM | POA: Diagnosis not present

## 2023-03-19 DIAGNOSIS — R001 Bradycardia, unspecified: Secondary | ICD-10-CM | POA: Diagnosis present

## 2023-03-19 DIAGNOSIS — N1831 Chronic kidney disease, stage 3a: Secondary | ICD-10-CM | POA: Diagnosis present

## 2023-03-19 DIAGNOSIS — I480 Paroxysmal atrial fibrillation: Secondary | ICD-10-CM | POA: Diagnosis present

## 2023-03-19 DIAGNOSIS — D696 Thrombocytopenia, unspecified: Secondary | ICD-10-CM | POA: Diagnosis present

## 2023-03-19 DIAGNOSIS — I129 Hypertensive chronic kidney disease with stage 1 through stage 4 chronic kidney disease, or unspecified chronic kidney disease: Secondary | ICD-10-CM | POA: Diagnosis present

## 2023-03-19 DIAGNOSIS — K869 Disease of pancreas, unspecified: Secondary | ICD-10-CM | POA: Diagnosis present

## 2023-03-19 DIAGNOSIS — Z800 Family history of malignant neoplasm of digestive organs: Secondary | ICD-10-CM | POA: Diagnosis not present

## 2023-03-19 DIAGNOSIS — R112 Nausea with vomiting, unspecified: Secondary | ICD-10-CM | POA: Diagnosis present

## 2023-03-19 DIAGNOSIS — Z96651 Presence of right artificial knee joint: Secondary | ICD-10-CM | POA: Diagnosis present

## 2023-03-19 DIAGNOSIS — Z8042 Family history of malignant neoplasm of prostate: Secondary | ICD-10-CM | POA: Diagnosis not present

## 2023-03-19 DIAGNOSIS — N136 Pyonephrosis: Secondary | ICD-10-CM | POA: Diagnosis present

## 2023-03-19 DIAGNOSIS — Z7982 Long term (current) use of aspirin: Secondary | ICD-10-CM | POA: Diagnosis not present

## 2023-03-19 DIAGNOSIS — Z818 Family history of other mental and behavioral disorders: Secondary | ICD-10-CM | POA: Diagnosis not present

## 2023-03-19 DIAGNOSIS — Z8249 Family history of ischemic heart disease and other diseases of the circulatory system: Secondary | ICD-10-CM | POA: Diagnosis not present

## 2023-03-19 DIAGNOSIS — Z8673 Personal history of transient ischemic attack (TIA), and cerebral infarction without residual deficits: Secondary | ICD-10-CM | POA: Diagnosis not present

## 2023-03-19 DIAGNOSIS — B964 Proteus (mirabilis) (morganii) as the cause of diseases classified elsewhere: Secondary | ICD-10-CM | POA: Diagnosis present

## 2023-03-19 DIAGNOSIS — N179 Acute kidney failure, unspecified: Secondary | ICD-10-CM | POA: Diagnosis present

## 2023-03-19 DIAGNOSIS — B9689 Other specified bacterial agents as the cause of diseases classified elsewhere: Secondary | ICD-10-CM | POA: Diagnosis present

## 2023-03-19 DIAGNOSIS — R7881 Bacteremia: Secondary | ICD-10-CM | POA: Diagnosis present

## 2023-03-19 DIAGNOSIS — N390 Urinary tract infection, site not specified: Secondary | ICD-10-CM | POA: Diagnosis present

## 2023-03-19 DIAGNOSIS — E785 Hyperlipidemia, unspecified: Secondary | ICD-10-CM | POA: Diagnosis present

## 2023-03-19 DIAGNOSIS — Q2112 Patent foramen ovale: Secondary | ICD-10-CM | POA: Diagnosis not present

## 2023-03-19 DIAGNOSIS — Z841 Family history of disorders of kidney and ureter: Secondary | ICD-10-CM | POA: Diagnosis not present

## 2023-03-19 HISTORY — PX: CYSTOSCOPY WITH RETROGRADE PYELOGRAM, URETEROSCOPY AND STENT PLACEMENT: SHX5789

## 2023-03-19 LAB — BLOOD CULTURE ID PANEL (REFLEXED) - BCID2

## 2023-03-19 LAB — BASIC METABOLIC PANEL
Anion gap: 6 (ref 5–15)
BUN: 26 mg/dL — ABNORMAL HIGH (ref 8–23)
CO2: 24 mmol/L (ref 22–32)
Calcium: 8.8 mg/dL — ABNORMAL LOW (ref 8.9–10.3)
Chloride: 104 mmol/L (ref 98–111)
Creatinine, Ser: 1.29 mg/dL — ABNORMAL HIGH (ref 0.44–1.00)
GFR, Estimated: 38 mL/min — ABNORMAL LOW (ref >60)
Glucose, Bld: 136 mg/dL — ABNORMAL HIGH (ref 70–99)
Potassium: 3.7 mmol/L (ref 3.5–5.1)
Sodium: 134 mmol/L — ABNORMAL LOW (ref 135–145)

## 2023-03-19 LAB — CBC
HCT: 30.5 % — ABNORMAL LOW (ref 36.0–46.0)
Hemoglobin: 10.3 g/dL — ABNORMAL LOW (ref 12.0–15.0)
MCH: 31.7 pg (ref 26.0–34.0)
MCHC: 33.8 g/dL (ref 30.0–36.0)
MCV: 93.8 fL (ref 80.0–100.0)
Platelets: 131 10*3/uL — ABNORMAL LOW (ref 150–400)
RBC: 3.25 MIL/uL — ABNORMAL LOW (ref 3.87–5.11)
RDW: 13.3 % (ref 11.5–15.5)
WBC: 9.9 10*3/uL (ref 4.0–10.5)
nRBC: 0 % (ref 0.0–0.2)

## 2023-03-19 LAB — SURGICAL PCR SCREEN
MRSA, PCR: POSITIVE — AB
Staphylococcus aureus: POSITIVE — AB

## 2023-03-19 LAB — I-STAT CG4 LACTIC ACID, ED: Lactic Acid, Venous: 1.4 mmol/L (ref 0.5–1.9)

## 2023-03-19 SURGERY — CYSTOURETEROSCOPY, WITH RETROGRADE PYELOGRAM AND STENT INSERTION
Anesthesia: General | Site: Urethra | Laterality: Right

## 2023-03-19 MED ORDER — LIDOCAINE 2% (20 MG/ML) 5 ML SYRINGE
INTRAMUSCULAR | Status: AC
Start: 2023-03-19 — End: ?
  Filled 2023-03-19: qty 10

## 2023-03-19 MED ORDER — ONDANSETRON HCL 4 MG/2ML IJ SOLN
INTRAMUSCULAR | Status: DC | PRN
  Administered 2023-03-19: 4 mg via INTRAVENOUS

## 2023-03-19 MED ORDER — SODIUM CHLORIDE 0.9 % IV SOLN
2.0000 g | INTRAVENOUS | Status: DC
  Administered 2023-03-19 – 2023-03-20 (×2): 2 g via INTRAVENOUS
  Filled 2023-03-19 (×2): qty 20

## 2023-03-19 MED ORDER — DEXMEDETOMIDINE HCL IN NACL 80 MCG/20ML IV SOLN
INTRAVENOUS | Status: AC
  Filled 2023-03-19: qty 20

## 2023-03-19 MED ORDER — FENTANYL CITRATE (PF) 100 MCG/2ML IJ SOLN: 25.0000 ug | INTRAMUSCULAR | Status: DC | PRN

## 2023-03-19 MED ORDER — BRIMONIDINE TARTRATE 0.2 % OP SOLN
1.0000 [drp] | Freq: Two times a day (BID) | OPHTHALMIC | Status: DC
  Administered 2023-03-19 – 2023-03-21 (×6): 1 [drp] via OPHTHALMIC
  Filled 2023-03-19: qty 5

## 2023-03-19 MED ORDER — EPHEDRINE SULFATE-NACL 50-0.9 MG/10ML-% IV SOSY
PREFILLED_SYRINGE | INTRAVENOUS | Status: DC | PRN
  Administered 2023-03-19 (×2): 5 mg via INTRAVENOUS

## 2023-03-19 MED ORDER — DEXAMETHASONE SODIUM PHOSPHATE 10 MG/ML IJ SOLN
INTRAMUSCULAR | Status: AC
  Filled 2023-03-19: qty 2

## 2023-03-19 MED ORDER — OXYCODONE HCL 5 MG/5ML PO SOLN: 5.0000 mg | Freq: Once | ORAL | Status: DC | PRN

## 2023-03-19 MED ORDER — PHENYLEPHRINE 80 MCG/ML (10ML) SYRINGE FOR IV PUSH (FOR BLOOD PRESSURE SUPPORT)
PREFILLED_SYRINGE | INTRAVENOUS | Status: AC
  Filled 2023-03-19: qty 20

## 2023-03-19 MED ORDER — SUCCINYLCHOLINE CHLORIDE 200 MG/10ML IV SOSY
PREFILLED_SYRINGE | INTRAVENOUS | Status: AC
  Filled 2023-03-19: qty 20

## 2023-03-19 MED ORDER — OXYCODONE HCL 5 MG PO TABS
5.0000 mg | ORAL_TABLET | Freq: Once | ORAL | Status: DC | PRN
Start: 2023-03-19 — End: 2023-03-19

## 2023-03-19 MED ORDER — POLYVINYL ALCOHOL 1.4 % OP SOLN: 2.0000 [drp] | Freq: Two times a day (BID) | OPHTHALMIC | Status: DC | PRN

## 2023-03-19 MED ORDER — ATORVASTATIN CALCIUM 10 MG PO TABS
20.0000 mg | ORAL_TABLET | Freq: Every day | ORAL | Status: DC
  Administered 2023-03-19 – 2023-03-22 (×4): 20 mg via ORAL
  Filled 2023-03-19 (×4): qty 2

## 2023-03-19 MED ORDER — OXYBUTYNIN CHLORIDE ER 10 MG PO TB24
10.0000 mg | ORAL_TABLET | Freq: Every day | ORAL | Status: DC
  Administered 2023-03-19 – 2023-03-21 (×3): 10 mg via ORAL
  Filled 2023-03-19 (×3): qty 1

## 2023-03-19 MED ORDER — PHENYLEPHRINE 80 MCG/ML (10ML) SYRINGE FOR IV PUSH (FOR BLOOD PRESSURE SUPPORT)
PREFILLED_SYRINGE | INTRAVENOUS | Status: DC | PRN
  Administered 2023-03-19: 80 ug via INTRAVENOUS
  Administered 2023-03-19 (×2): 160 ug via INTRAVENOUS

## 2023-03-19 MED ORDER — MUPIROCIN 2 % EX OINT
1.0000 | TOPICAL_OINTMENT | Freq: Two times a day (BID) | CUTANEOUS | Status: DC
  Administered 2023-03-19 – 2023-03-22 (×7): 1 via NASAL
  Filled 2023-03-19: qty 22

## 2023-03-19 MED ORDER — FENTANYL CITRATE (PF) 100 MCG/2ML IJ SOLN
INTRAMUSCULAR | Status: AC
  Filled 2023-03-19: qty 2

## 2023-03-19 MED ORDER — ACETAMINOPHEN 10 MG/ML IV SOLN
INTRAVENOUS | Status: AC
  Filled 2023-03-19: qty 100

## 2023-03-19 MED ORDER — ACETAMINOPHEN 325 MG PO TABS: 650.0000 mg | ORAL_TABLET | Freq: Four times a day (QID) | ORAL | Status: DC | PRN

## 2023-03-19 MED ORDER — BRIMONIDINE TARTRATE-TIMOLOL 0.2-0.5 % OP SOLN: 1.0000 [drp] | Freq: Two times a day (BID) | OPHTHALMIC | Status: DC

## 2023-03-19 MED ORDER — PROCHLORPERAZINE EDISYLATE 10 MG/2ML IJ SOLN
5.0000 mg | Freq: Four times a day (QID) | INTRAMUSCULAR | Status: DC | PRN
  Administered 2023-03-19 – 2023-03-22 (×2): 5 mg via INTRAVENOUS
  Filled 2023-03-19 (×3): qty 2

## 2023-03-19 MED ORDER — ALBUMIN HUMAN 5 % IV SOLN: INTRAVENOUS | Status: DC | PRN

## 2023-03-19 MED ORDER — SENNOSIDES-DOCUSATE SODIUM 8.6-50 MG PO TABS: 1.0000 | ORAL_TABLET | Freq: Every evening | ORAL | Status: DC | PRN

## 2023-03-19 MED ORDER — ROCURONIUM BROMIDE 10 MG/ML (PF) SYRINGE
PREFILLED_SYRINGE | INTRAVENOUS | Status: AC
  Filled 2023-03-19: qty 10

## 2023-03-19 MED ORDER — SUCCINYLCHOLINE CHLORIDE 200 MG/10ML IV SOSY
PREFILLED_SYRINGE | INTRAVENOUS | Status: DC | PRN
  Administered 2023-03-19: 100 mg via INTRAVENOUS

## 2023-03-19 MED ORDER — 0.9 % SODIUM CHLORIDE (POUR BTL) OPTIME
TOPICAL | Status: DC | PRN
  Administered 2023-03-19: 1000 mL

## 2023-03-19 MED ORDER — EPHEDRINE 5 MG/ML INJ
INTRAVENOUS | Status: AC
  Filled 2023-03-19: qty 15

## 2023-03-19 MED ORDER — CHLORHEXIDINE GLUCONATE CLOTH 2 % EX PADS
6.0000 | MEDICATED_PAD | Freq: Every day | CUTANEOUS | Status: DC
Start: 2023-03-19 — End: 2023-03-24
  Administered 2023-03-19 – 2023-03-22 (×4): 6 via TOPICAL

## 2023-03-19 MED ORDER — SODIUM CHLORIDE 0.9% FLUSH
3.0000 mL | Freq: Two times a day (BID) | INTRAVENOUS | Status: DC
  Administered 2023-03-19 – 2023-03-21 (×7): 3 mL via INTRAVENOUS

## 2023-03-19 MED ORDER — FENTANYL CITRATE (PF) 100 MCG/2ML IJ SOLN
INTRAMUSCULAR | Status: DC | PRN
  Administered 2023-03-19: 25 ug via INTRAVENOUS

## 2023-03-19 MED ORDER — LATANOPROST 0.005 % OP SOLN
1.0000 [drp] | Freq: Every day | OPHTHALMIC | Status: DC
  Administered 2023-03-19 – 2023-03-21 (×3): 1 [drp] via OPHTHALMIC
  Filled 2023-03-19: qty 2.5

## 2023-03-19 MED ORDER — DEXAMETHASONE SODIUM PHOSPHATE 10 MG/ML IJ SOLN
INTRAMUSCULAR | Status: DC | PRN
  Administered 2023-03-19: 5 mg via INTRAVENOUS

## 2023-03-19 MED ORDER — PROPOFOL 10 MG/ML IV BOLUS
INTRAVENOUS | Status: DC | PRN
  Administered 2023-03-19: 100 mg via INTRAVENOUS

## 2023-03-19 MED ORDER — MEPERIDINE HCL 25 MG/ML IJ SOLN: 6.2500 mg | INTRAMUSCULAR | Status: DC | PRN

## 2023-03-19 MED ORDER — SODIUM CHLORIDE 0.9 % IV SOLN: 1.0000 g | INTRAVENOUS | Status: DC

## 2023-03-19 MED ORDER — TIMOLOL MALEATE 0.5 % OP SOLN
1.0000 [drp] | Freq: Two times a day (BID) | OPHTHALMIC | Status: DC
  Administered 2023-03-19 – 2023-03-22 (×8): 1 [drp] via OPHTHALMIC
  Filled 2023-03-19: qty 5

## 2023-03-19 MED ORDER — ARTIFICIAL TEARS OPHTHALMIC OINT
TOPICAL_OINTMENT | OPHTHALMIC | Status: AC
  Filled 2023-03-19: qty 3.5

## 2023-03-19 MED ORDER — ONDANSETRON HCL 4 MG/2ML IJ SOLN
INTRAMUSCULAR | Status: AC
  Filled 2023-03-19: qty 4

## 2023-03-19 MED ORDER — MIDAZOLAM HCL 2 MG/2ML IJ SOLN
0.5000 mg | Freq: Once | INTRAMUSCULAR | Status: DC | PRN
Start: 2023-03-19 — End: 2023-03-19

## 2023-03-19 MED ORDER — ACETAMINOPHEN 10 MG/ML IV SOLN
INTRAVENOUS | Status: DC | PRN
  Administered 2023-03-19: 1000 mg via INTRAVENOUS

## 2023-03-19 MED ORDER — ACETAMINOPHEN 650 MG RE SUPP: 650.0000 mg | Freq: Four times a day (QID) | RECTAL | Status: DC | PRN

## 2023-03-19 MED ORDER — SODIUM CHLORIDE 0.9 % IV SOLN
INTRAVENOUS | Status: AC
Start: 2023-03-19 — End: 2023-03-19

## 2023-03-19 MED ORDER — LIDOCAINE 2% (20 MG/ML) 5 ML SYRINGE
INTRAMUSCULAR | Status: DC | PRN
  Administered 2023-03-19: 20 mg via INTRAVENOUS

## 2023-03-19 SURGICAL SUPPLY — 5 items
CATH URETL OPEN 5X70 (CATHETERS) IMPLANT
GUIDEWIRE STR DUAL SENSOR (WIRE) IMPLANT
PACK CYSTO (CUSTOM PROCEDURE TRAY) IMPLANT
STENT URET 6FRX24 CONTOUR (STENTS) IMPLANT
TRAY FOLEY MTR SLVR 16FR STAT (SET/KITS/TRAYS/PACK) IMPLANT

## 2023-03-19 NOTE — Evaluation (Signed)
Occupational Therapy Evaluation Patient Details Name: Holly Nunez MRN: [id number] DOB: July 07, 1924 Today's Date: 03/19/2023   History of Present Illness Pt is a 89 y/o F presenting to ED on 11/1 from [facility] with AMS, R abdominal pain, and dysuria. Imaging reveals 9mm R UPJ stone with R hydronephrosis, s/p R uretereal stent placementon 11/2. PMH includes arthritis, hyperglycemia, HTN, HLD, TIA   Clinical Impression   Pt from SNF, transfers to w/c at baseline with assist from SNF staff. Pt also has assist for ADLs. Pt with decr cognition, states she is at the hospital for a fall and is very anxious/fearful regarding falling. Pt currently needing set up -max A for ADLs, and max A for bed mobility. Transfers deferred as pt anxious/tearful sitting EOB. Pt presenting with impairments listed below, will follow acutely. Patient will benefit from continued inpatient follow up therapy, <3 hours/day to maximize safety/ind with ADLs/functional mobility.       If plan is discharge home, recommend the following: Two people to help with walking and/or transfers;A lot of help with bathing/dressing/bathroom;Assistance with cooking/housework;Direct supervision/assist for medications management;Direct supervision/assist for financial management;Assist for transportation;Help with stairs or ramp for entrance;Supervision due to cognitive status    Functional Status Assessment  Patient has had a recent decline in their functional status and demonstrates the ability to make significant improvements in function in a reasonable and predictable amount of time.  Equipment Recommendations  Other (comment) (defer)    Recommendations for Other Services PT consult     Precautions / Restrictions Precautions Precautions: Fall Restrictions Weight Bearing Restrictions: No      Mobility Bed Mobility Overal bed mobility: Needs Assistance Bed Mobility: Sidelying to Sit, Sit to Sidelying   Sidelying to sit:  Max assist     Sit to sidelying: Max assist      Transfers                   General transfer comment: deferred, pt  fearful of falling      Balance Overall balance assessment: Needs assistance Sitting-balance support: Feet supported Sitting balance-Leahy Scale: Fair                                     ADL either performed or assessed with clinical judgement   ADL Overall ADL's : Needs assistance/impaired Eating/Feeding: Set up   Grooming: Set up   Upper Body Bathing: Moderate assistance   Lower Body Bathing: Maximal assistance   Upper Body Dressing : Moderate assistance   Lower Body Dressing: Maximal assistance   Toilet Transfer: Maximal assistance   Toileting- Clothing Manipulation and Hygiene: Maximal assistance       Functional mobility during ADLs: Maximal assistance       Vision Baseline Vision/History: 1 Wears glasses Vision Assessment?: No apparent visual deficits     Perception Perception: Not tested       Praxis Praxis: Not tested       Pertinent Vitals/Pain Pain Assessment Pain Assessment: No/denies pain     Extremity/Trunk Assessment Upper Extremity Assessment Upper Extremity Assessment: Generalized weakness   Lower Extremity Assessment Lower Extremity Assessment: Defer to PT evaluation   Cervical / Trunk Assessment Cervical / Trunk Assessment: Normal   Communication Communication Communication: No apparent difficulties   Cognition Arousal: Alert Behavior During Therapy: WFL for tasks assessed/performed, Anxious Overall Cognitive Status: Impaired/Different from baseline Area of Impairment: Memory, Following commands  Memory: Decreased short-term memory Following Commands: Follows one step commands with increased time       General Comments: decr STM, states she is at the hospital for a fall     General Comments  none stated    Exercises     Shoulder Instructions       Home Living Family/patient expects to be discharged to:: Skilled nursing facility                                 Additional Comments: has been at [facility] for 1 year      Prior Functioning/Environment               Mobility Comments: assist to transfer to w/c , staff propels for pt ADLs Comments: does self feeding and groomign tasks, assist for other ADLs        OT Problem List: Decreased strength;Decreased range of motion;Decreased activity tolerance;Impaired balance (sitting and/or standing);Decreased cognition      OT Treatment/Interventions: Self-care/ADL training;Therapeutic exercise;Energy conservation;DME and/or AE instruction;Therapeutic activities;Visual/perceptual remediation/compensation;Patient/family education    OT Goals(Current goals can be found in the care plan section) Acute Rehab OT Goals Patient Stated Goal: none stated OT Goal Formulation: With patient Time For Goal Achievement: 04/02/23 Potential to Achieve Goals: Good ADL Goals Pt Will Perform Upper Body Dressing: with min assist;sitting Pt Will Perform Lower Body Dressing: with mod assist;sit to/from stand;sitting/lateral leans Pt Will Transfer to Toilet: with min assist;squat pivot transfer;stand pivot transfer;bedside commode Pt Will Perform Toileting - Clothing Manipulation and hygiene: with min assist;sitting/lateral leans;sit to/from stand Additional ADL Goal #1: pt will perform bed mobility min A in prep for seated ADLs  OT Frequency: Min 1X/week    Co-evaluation              AM-PAC OT "6 Clicks" Daily Activity     Outcome Measure Help from another person eating meals?: None Help from another person taking care of personal grooming?: A Little Help from another person toileting, which includes using toliet, bedpan, or urinal?: A Lot Help from another person bathing (including washing, rinsing, drying)?: A Lot Help from another person to put on and taking off  regular upper body clothing?: A Little Help from another person to put on and taking off regular lower body clothing?: A Lot 6 Click Score: 16   End of Session Nurse Communication: Mobility status  Activity Tolerance: Patient tolerated treatment well Patient left: in bed;with call bell/phone within reach;with bed alarm set;with family/visitor present  OT Visit Diagnosis: Unsteadiness on feet (R26.81);Other abnormalities of gait and mobility (R26.89);Muscle weakness (generalized) (M62.81)                Time: 0865-7846 OT Time Calculation (min): 18 min Charges:  OT General Charges $OT Visit: 1 Visit OT Evaluation $OT Eval Moderate Complexity: 1 Mod  Autumn Gunn K, OTD, OTR/L SecureChat Preferred Acute Rehab [phone]   Carver Fila Koonce 03/19/2023, 3:25 PM

## 2023-03-19 NOTE — H&P (Signed)
History and Physical    Holly Nunez ZOX:[id number] DOB: [birth year] DOA: 03/18/2023  PCP: Pcp, No   Patient coming from: SNF   Chief Complaint: Fatigue, N/V, dysuria, right abdominal pain    HPI: Holly Nunez is a pleasant 89 y.o. female with medical history significant for hypertension, TIA, cognitive deficits, atrial fibrillation not anticoagulated due to frequent falls, and CKD 3A who presents with fatigue, dysuria, right-sided abdominal pain, nausea, and vomiting.   Patient reports that she has been experiencing right-sided abdominal pain and dysuria for roughly 1 week.  She seems to be more confused to SNF personnel.  Her daughter feels that she is at her cognitive baseline but has been more fatigued.  Patient has also had nausea with nonbloody vomiting since yesterday.  ED Course: Upon arrival to the ED, patient is found to be afebrile and saturating well on room air with low heart rate and systolic blood pressure in the 90s and greater.  Labs are most notable for creatinine 1.63, WBC 12,500, and normal lactate.  UA notable for bacteriuria, pyuria, hematuria, and proteinuria.  Imaging reveals 9 mm right UPJ stone with moderate right hydronephrosis.  Blood cultures were collected and the patient was given 1 L of NS and 1 g IV Rocephin.  Urology was consulted by the ED physician and recommended medical admission.   Review of Systems:  All other systems reviewed and apart from HPI, are negative.  Past Medical History:  Diagnosis Date   Arthritis    Estrogen deficiency 05/27/2016   Great toe pain, left 12/02/2016   H/O measles    H/O mumps    History of chicken pox    Hyperglycemia 05/27/2016   Hyperlipidemia    Hypertension    Patent foramen ovale    Preventative health care 02/26/2015   Renal insufficiency    Thyroid nodule    Biopsy-negative   TIA (transient ischemic attack)    Urinary incontinence     Past Surgical History:  Procedure Laterality Date   ABDOMINAL  HYSTERECTOMY     fibroid   APPENDECTOMY     EYE SURGERY Bilateral    CATARACT REMOVAL   JOINT REPLACEMENT     REPLACEMENT TOTAL KNEE Right 2007   THYROID LOBECTOMY Left 04/25/2015   THYROID LOBECTOMY Left 04/25/2015   Procedure: LEFT THYROID LOBECTOMY;  Surgeon: Darnell Level, MD;  Location: [facility];  Service: General;  Laterality: Left;   TRIGGER FINGER RELEASE  2007    Social History:   reports that she has never smoked. She has never used smokeless tobacco. She reports that she does not drink alcohol and does not use drugs.  Allergies  Allergen Reactions   Alphagan [Brimonidine] Other (See Comments)    Secondary infection and vision changes    Family History  Problem Relation Age of Onset   Heart disease Mother    Hypertension Mother    Kidney disease Sister    Mental illness Sister        suicide, depresion   Gout Brother    Hypertension Son    Cancer Maternal Grandmother    Cancer Sister    Bone cancer Sister    Cancer Sister    Cancer Sister        kidney   Cancer Daughter    Pancreatic cancer Daughter    Thyroid disease Daughter    Thyroid disease Daughter        thyroid nodules   Hypertension Daughter    Irritable bowel  syndrome Daughter    Cancer Daughter        thyroid   Cancer Brother    Prostate cancer Brother      Prior to Admission medications   Medication Sig Start Date End Date Taking? Authorizing Provider  acetaminophen (TYLENOL) 650 MG CR tablet Take 2 tablets (1,300 mg total) by mouth every 6 (six) hours as needed for pain (for mild pain). 05/15/21  Yes Calvert Cantor, MD  aspirin EC 81 MG tablet Take 81 mg by mouth daily. Swallow whole.   Yes [provider]  atorvastatin (LIPITOR) 20 MG tablet Take 20 mg by mouth daily.   Yes [provider]  carvedilol (COREG) 3.125 MG tablet TAKE 2 TABLETS(6.25 MG) BY MOUTH TWICE DAILY Patient taking differently: Take 2 tablets by mouth in the morning and at bedtime. TAKE 2 TABLETS(6.25 MG)  BY MOUTH TWICE DAILY 05/28/21  Yes Bradd Canary, MD  COMBIGAN 0.2-0.5 % ophthalmic solution Place 1 drop into both eyes every 12 (twelve) hours. 03/14/23  Yes [provider]  Dextran 70-Hypromellose 0.1-0.3 % SOLN Apply 2 drops to eye every 12 (twelve) hours as needed (dry, itchy, irritated eyes).   Yes [provider]  dextromethorphan-guaiFENesin (ROBITUSSIN-DM) 10-100 MG/5ML liquid Take 10 mLs by mouth every 6 (six) hours as needed for cough.   Yes [provider]  folic acid (FOLVITE) 1 MG tablet Take 1 mg by mouth daily.   Yes [provider]  latanoprost (XALATAN) 0.005 % ophthalmic solution Place 1 drop into both eyes at bedtime. 03/10/23  Yes [provider]  Menthol, Topical Analgesic, (BIOFREEZE COOL THE PAIN) 4 % GEL Apply 1 Application topically in the morning, at noon, and at bedtime.   Yes [provider]  Multiple Vitamin (MULTIVITAMIN) tablet Take 1 tablet by mouth daily.   Yes [provider]  nitroGLYCERIN (NITROSTAT) 0.4 MG SL tablet Place 1 tablet (0.4 mg total) under the tongue every 5 (five) minutes as needed for chest pain. 10/01/20 03/18/23 Yes Lewayne Bunting, MD  Omega-3 Fatty Acids (FISH OIL PO) Take 1 capsule by mouth daily.   Yes [provider]  oxybutynin (DITROPAN-XL) 10 MG 24 hr tablet TAKE 1 TABLET(10 MG) BY MOUTH AT BEDTIME Patient taking differently: Take 10 mg by mouth at bedtime. TAKE 1 TABLET(10 MG) BY MOUTH AT BEDTIME 09/14/21  Yes Bradd Canary, MD  polyethylene glycol (MIRALAX / GLYCOLAX) 17 g packet Take 17 g by mouth daily.   Yes [provider]  Probiotic Product (PROBIOTIC PO) Take 1 tablet by mouth daily.   Yes [provider]  Trolamine Salicylate (ASPERCREME EX) Apply 1 application  topically in the morning and at bedtime.   Yes [provider]  Simethicone (GAS-X PO) Take 1 tablet by mouth at bedtime as needed (heartburn/gas). Patient not taking:  Reported on 03/18/2023    [provider]    Physical Exam: Vitals:   03/18/23 2100 03/18/23 2130 03/18/23 2200 03/18/23 2324  BP: (!) 93/50 (!) 92/49 (!) 99/51 (!) 105/52  Pulse: (!) 53 (!) 50 (!) 46 (!) 54  Resp: 16 (!) 9 16 17   Temp:      TempSrc:      SpO2: 100% 100% 100% 99%    Constitutional: NAD, calm  Eyes: PERTLA, lids and conjunctivae normal ENMT: Mucous membranes are moist. Posterior pharynx clear of any exudate or lesions.   Neck: supple, no masses  Respiratory: no wheezing, no crackles. No accessory muscle  use.  Cardiovascular: S1 & S2 heard, regular rate and rhythm. No JVD. Abdomen: No distension, soft. Bowel sounds active.  Musculoskeletal: no clubbing / cyanosis. No joint deformity upper and lower extremities.   Skin: no significant rashes, lesions, ulcers. Warm, dry, well-perfused. Neurologic: CN 2-12 grossly intact. Moving all extremities. Alert and oriented to person, place, and situation.  Psychiatric: Pleasant. Cooperative.    Labs and Imaging on Admission: I have personally reviewed following labs and imaging studies  CBC: Recent Labs  Lab 03/18/23 2100  WBC 12.5*  HGB 11.2*  HCT 37.5  MCV 103.9*  PLT 142*   Basic Metabolic Panel: Recent Labs  Lab 03/18/23 2100  NA 136  K 4.9  CL 103  CO2 22  GLUCOSE 180*  BUN 29*  CREATININE 1.63*  CALCIUM 9.3   GFR: CrCl cannot be calculated (Unknown ideal weight.). Liver Function Tests: Recent Labs  Lab 03/18/23 2100  AST 33  ALT 16  ALKPHOS 72  BILITOT 1.4*  PROT 5.8*  ALBUMIN 2.4*   No results for input(s): "LIPASE", "AMYLASE" in the last 168 hours. No results for input(s): "AMMONIA" in the last 168 hours. Coagulation Profile: No results for input(s): "INR", "PROTIME" in the last 168 hours. Cardiac Enzymes: No results for input(s): "CKTOTAL", "CKMB", "CKMBINDEX", "TROPONINI" in the last 168 hours. BNP (last 3 results) No results for input(s): "PROBNP" in the last 8760  hours. HbA1C: No results for input(s): "HGBA1C" in the last 72 hours. CBG: Recent Labs  Lab 03/18/23 2153  GLUCAP 154*   Lipid Profile: No results for input(s): "CHOL", "HDL", "LDLCALC", "TRIG", "CHOLHDL", "LDLDIRECT" in the last 72 hours. Thyroid Function Tests: No results for input(s): "TSH", "T4TOTAL", "FREET4", "T3FREE", "THYROIDAB" in the last 72 hours. Anemia Panel: No results for input(s): "VITAMINB12", "FOLATE", "FERRITIN", "TIBC", "IRON", "RETICCTPCT" in the last 72 hours. Urine analysis:    Component Value Date/Time   COLORURINE YELLOW 03/18/2023 2207   APPEARANCEUR CLEAR 03/18/2023 2207   LABSPEC 1.015 03/18/2023 2207   PHURINE 8.5 (H) 03/18/2023 2207   GLUCOSEU NEGATIVE 03/18/2023 2207   HGBUR LARGE (A) 03/18/2023 2207   BILIRUBINUR SMALL (A) 03/18/2023 2207   KETONESUR NEGATIVE 03/18/2023 2207   PROTEINUR >300 (A) 03/18/2023 2207   NITRITE POSITIVE (A) 03/18/2023 2207   LEUKOCYTESUR MODERATE (A) 03/18/2023 2207   Sepsis Labs: @LABRCNTIP (procalcitonin:4,lacticidven:4) )No results found for this or any previous visit (from the past 240 hour(s)).   Radiological Exams on Admission: CT Head Wo Contrast  Result Date: 03/18/2023 CLINICAL DATA:  Altered mental status, vomiting EXAM: CT HEAD WITHOUT CONTRAST TECHNIQUE: Contiguous axial images were obtained from the base of the skull through the vertex without intravenous contrast. RADIATION DOSE REDUCTION: This exam was performed according to the departmental dose-optimization program which includes automated exposure control, adjustment of the mA and/or kV according to patient size and/or use of iterative reconstruction technique. COMPARISON:  05/14/2021 FINDINGS: Brain: There is atrophy and chronic small vessel disease changes. No acute intracranial abnormality. Specifically, no hemorrhage, hydrocephalus, mass lesion, acute infarction, or significant intracranial injury. Vascular: No hyperdense vessel or unexpected  calcification. Skull: No acute calvarial abnormality. Sinuses/Orbits: No acute findings Other: None IMPRESSION: Atrophy, chronic microvascular disease. No acute intracranial abnormality. Electronically Signed   By: Charlett Nose M.D.   On: 03/18/2023 23:22   CT ABDOMEN PELVIS W CONTRAST  Result Date: 03/18/2023 CLINICAL DATA:  Nausea, vomiting, abdominal pain EXAM: CT ABDOMEN AND PELVIS WITH CONTRAST TECHNIQUE: Multidetector CT imaging of the abdomen and pelvis was  performed using the standard protocol following bolus administration of intravenous contrast. RADIATION DOSE REDUCTION: This exam was performed according to the departmental dose-optimization program which includes automated exposure control, adjustment of the mA and/or kV according to patient size and/or use of iterative reconstruction technique. CONTRAST:  60mL OMNIPAQUE IOHEXOL 350 MG/ML SOLN COMPARISON:  05/14/2021 FINDINGS: Lower chest: Small right pleural effusion. Dependent atelectasis bilaterally. Heart borderline in size. Coronary artery and aortic atherosclerosis. Hepatobiliary: No focal hepatic abnormality. Gallbladder unremarkable. Pancreas: Bilobed cystic area again noted in the pancreatic head measuring 2.7 x 1.5 cm compared to 2.0 x 1.0 cm previously. No pancreatic ductal dilatation or surrounding inflammation. Spleen: No focal abnormality.  Normal size. Adrenals/Urinary Tract: Adrenal glands normal. Large right mid to upper pole cyst measuring up to 8 cm compared to 8 cm previously. 9 mm stone at the right UPJ with moderate right hydronephrosis. Multiple small layering stones in the dilated renal pelvis. No stones or hydronephrosis on the left. Urinary bladder decompressed. Stomach/Bowel: Distention of the stomach with gas and fluid. Small bowel decompressed. Large bowel grossly unremarkable. Vascular/Lymphatic: Aortoiliac atherosclerosis. No evidence of aneurysm or adenopathy. Reproductive: Prior hysterectomy.  No adnexal masses.  Other: No free fluid or free air. Musculoskeletal: No acute bony abnormality. IMPRESSION: Small right pleural effusion. 9 mm right UPJ stone with moderate right hydronephrosis. Multiple layering stones in the right renal pelvis. Slowly enlarging bilobed cyst in the pancreatic head, currently 2.7 x 1.5 cm. Aortoiliac atherosclerosis. Electronically Signed   By: Charlett Nose M.D.   On: 03/18/2023 23:21     Assessment/Plan   1. Obstructing right UPJ stone; UTI; AKI superimposed on CKD 3A - Presents with fatigue and N/V and found to have SCr 1.63 (baseline <1), UTI, and obstructing right UPJ stone  - Blood cultures collected in ED and [place] and IVF administered  - Culture urine, continue Rocephin, continue IVF hydration, renally-dose medications, repeat chem panel in am, keep NPO pending urology consultation   2. Atrial fibrillation  - Not anticoagulated d/t hx of falls - Hold Coreg given SBP low 90s and HR 40s-50s in ED    3. Cognitive deficits  - Delirium precautions    4. Hx of TIA  - Continue Lipitor, hold ASA for anticipated procedure    5. Pancreatic mass  - Again noted on imaging in ED  - Outpatient follow-up advised    DVT prophylaxis: SCDs  Code Status: Full   Level of Care: Level of care: Telemetry Family Communication: Daughter at bedside  Disposition Plan:  Patient is from: SNF  Anticipated d/c is to: SNF  Anticipated d/c date is: 03/21/23  Patient currently: Pending urology consultation, improved/stable renal function, cultures  Consults called: Urology  Admission status: Inpatient     Briscoe Deutscher, MD [facility]  03/19/2023, 12:13 AM

## 2023-03-19 NOTE — Transfer of Care (Signed)
Immediate Anesthesia Transfer of Care Note  Patient: Holly Nunez  Procedure(s) Performed: CYSTO-URETEROSCOPY; RIGHT URETERAL STENT PLACEMENT (Right: Urethra)  Patient Location: PACU  Anesthesia Type:General  Level of Consciousness: drowsy  Airway & Oxygen Therapy: Patient Spontanous Breathing  Post-op Assessment: Report given to RN and Post -op Vital signs reviewed and stable  Post vital signs: Reviewed and stable  Last Vitals:  Vitals Value Taken Time  BP 97/59 03/19/23 0522  Temp 36.4 C 03/19/23 0520  Pulse 70 03/19/23 0524  Resp 20 03/19/23 0525  SpO2 98 % 03/19/23 0524  Vitals shown include unfiled device data.  Last Pain:  Vitals:   03/19/23 0036  TempSrc: Oral  PainSc:          Complications: No notable events documented.

## 2023-03-19 NOTE — Op Note (Signed)
Preoperative diagnosis: right obstructing ureteral calculus  Postoperative diagnosis: right obstructing ureteral calculus  Procedure:  Cystoscopy right 7F x 24 ureteral stent placement  right retrograde pyelography with interpretation  Surgeon: Di Kindle, MD  Anesthesia: General  Complications: None  Intraoperative findings:  -Uncomplicated right ureteral stent placement -hydronephrotic gtt present after relief of obstruction - appearance of thick toothpaste emenating from UO  EBL: Minimal  Disposition of specimens: [facility] for stone analysis  Indication: Holly Nunez is a 89 y.o.   patient with a infected right ureteral stone and associated right symptoms. After reviewing the management options for treatment, the patient elected to proceed with the above surgical procedure(s). We have discussed the potential benefits and risks of the procedure, side effects of the proposed treatment, the likelihood of the patient achieving the goals of the procedure, and any potential problems that might occur during the procedure or recuperation. Informed consent has been obtained.   Description of procedure:  The patient was taken to the operating room and general anesthesia was induced.  The patient was placed in the dorsal lithotomy position, prepped and draped in the usual sterile fashion, and preoperative antibiotics were administered. A preoperative time-out was performed.   Cystourethroscopy was performed.  The patient's urethra was examined and was normal. The bladder was then systematically examined in its entirety. There was no evidence for any bladder tumors, stones, or other mucosal pathology.    Attention then turned to the right ureteral orifice and a ureteral catheter was used to intubate the ureteral orifice.  A 0.38 sensor guidewire was then advanced up the right ureter into the renal pelvis under fluoroscopic guidance.   We then advanced our 5  [place] open-ended ureteral catheter over the sensor wire beyond the obstructing stone.  We then collected a urine culture.  We then shot a gentle retrograde pyelogram to help [place] with ureteral stent placement.  We then replaced our sensor wire through the 5 [place] open-ended catheter.  The 5 [place] open-ended catheter was removed.  We then advanced a 6 French x 24 cm ureteral stent over a sensor wire proximal curls were noted appropriately both proximally and distally fluoroscopically.  The bladder was then emptied and the procedure ended.  The patient appeared to tolerate the procedure well and without complications.  The patient was able to be awakened and transferred to the recovery unit in satisfactory condition.   Plan:  Follow-up urine cultures Remain on broad-spectrum antibiotics Patient will have definitive stone treatment in 2 to 4 weeks after resolution of infection   Roby Lofts, MD Resident Physician [facility]

## 2023-03-19 NOTE — Anesthesia Procedure Notes (Signed)
Procedure Name: Intubation Date/Time: 03/19/2023 4:22 AM  Performed by: Tressia Miners, CRNAPre-anesthesia Checklist: Patient identified, Emergency Drugs available, Suction available, Patient being monitored and Timeout performed Patient Re-evaluated:Patient Re-evaluated prior to induction Oxygen Delivery Method: Circle system utilized Preoxygenation: Pre-oxygenation with 100% oxygen Induction Type: IV induction, Cricoid Pressure applied and Rapid sequence Laryngoscope Size: Mac and 4 Grade View: Grade I Tube type: Oral Tube size: 6.5 mm Number of attempts: 1 Airway Equipment and Method: Stylet Placement Confirmation: ETT inserted through vocal cords under direct vision, positive ETCO2 and breath sounds checked- equal and bilateral Secured at: 20 cm Tube secured with: Tape Dental Injury: Teeth and Oropharynx as per pre-operative assessment  Comments: Smooth IV Induction. Eyes taped. RSI performed. DL x 1 with grade 1 view. Atraumatically placed, teeth and lip remain intact as pre-op. Secured with tape. Bilateral breath sounds +/=, EtCO2 +, Adequate TV, VSS.

## 2023-03-19 NOTE — Progress Notes (Signed)
PHARMACY - PHYSICIAN COMMUNICATION CRITICAL VALUE ALERT - BLOOD CULTURE IDENTIFICATION (BCID)  Holly Nunez is an 89 y.o. female who presented to [facility] on 03/18/2023 with a chief complaint of ureteral obstruction. BCID call received for growth in 2 of 4 bottles showing gram negative rods, identified as proteus without ESBL.  Name of physician (or Provider) Contacted: Dr. Renford Dills  Current antibiotics: ceftriaxone 1g q24  Changes to prescribed antibiotics recommended:  Patient is on recommended antibiotics - No changes needed. Dose adjusted to 2g q24h for bacteremia.  Results for orders placed or performed during the hospital encounter of 03/18/23  Blood Culture ID Panel (Reflexed) (Collected: 03/18/2023 11:50 PM)  Result Value Ref Range   Enterococcus faecalis NOT DETECTED NOT DETECTED   Enterococcus Faecium NOT DETECTED NOT DETECTED   Listeria monocytogenes NOT DETECTED NOT DETECTED   Staphylococcus species NOT DETECTED NOT DETECTED   Staphylococcus aureus (BCID) NOT DETECTED NOT DETECTED   Staphylococcus epidermidis NOT DETECTED NOT DETECTED   Staphylococcus lugdunensis NOT DETECTED NOT DETECTED   Streptococcus species NOT DETECTED NOT DETECTED   Streptococcus agalactiae NOT DETECTED NOT DETECTED   Streptococcus pneumoniae NOT DETECTED NOT DETECTED   Streptococcus pyogenes NOT DETECTED NOT DETECTED   A.calcoaceticus-baumannii NOT DETECTED NOT DETECTED   Bacteroides fragilis NOT DETECTED NOT DETECTED   Enterobacterales DETECTED (A) NOT DETECTED   Enterobacter cloacae complex NOT DETECTED NOT DETECTED   Escherichia coli NOT DETECTED NOT DETECTED   Klebsiella aerogenes NOT DETECTED NOT DETECTED   Klebsiella oxytoca NOT DETECTED NOT DETECTED   Klebsiella pneumoniae NOT DETECTED NOT DETECTED   Proteus species DETECTED (A) NOT DETECTED   Salmonella species NOT DETECTED NOT DETECTED   Serratia marcescens NOT DETECTED NOT DETECTED   Haemophilus influenzae NOT DETECTED NOT  DETECTED   Neisseria meningitidis NOT DETECTED NOT DETECTED   Pseudomonas aeruginosa NOT DETECTED NOT DETECTED   Stenotrophomonas maltophilia NOT DETECTED NOT DETECTED   Candida albicans NOT DETECTED NOT DETECTED   Candida auris NOT DETECTED NOT DETECTED   Candida glabrata NOT DETECTED NOT DETECTED   Candida krusei NOT DETECTED NOT DETECTED   Candida parapsilosis NOT DETECTED NOT DETECTED   Candida tropicalis NOT DETECTED NOT DETECTED   Cryptococcus neoformans/gattii NOT DETECTED NOT DETECTED   CTX-M ESBL NOT DETECTED NOT DETECTED   Carbapenem resistance IMP NOT DETECTED NOT DETECTED   Carbapenem resistance KPC NOT DETECTED NOT DETECTED   Carbapenem resistance NDM NOT DETECTED NOT DETECTED   Carbapenem resist OXA 48 LIKE NOT DETECTED NOT DETECTED   Carbapenem resistance VIM NOT DETECTED NOT DETECTED    Knute Neu 03/19/2023  2:06 PM

## 2023-03-19 NOTE — Progress Notes (Signed)
PROGRESS NOTE  Revella Shelton  ZOX:[id number] DOB: Apr 13, 1925 DOA: 03/18/2023 PCP: Pcp, No   Brief Narrative: Patient is a 89 year old female with history of hypertension, TIA, coronary disease, atrial fibrillation not on anticoagulation, CKD stage III who presented with fatigue, dysuria, right-sided abdomen pain, nausea/vomiting from at [facility].  Lab work on presentation showed creatinine of 1.6, WBC count of 12.5.  UA was suspicious for UTI.  Imaging showed 9 mm right UPJ stone with moderate hydronephrosis.  Patient was started on antibiotics, IV fluids per urology consulted.  Status post cystoscopy, right-sided ureteral stent placement on 11/2.  Cultures pending.  Assessment & Plan:  Principal Problem:   Obstruction of right ureteropelvic junction (UPJ) due to stone Active Problems:   History of TIA (transient ischemic attack)   Unspecified atrial fibrillation (HCC)   Cognitive deficits   Pancreatic mass   Acute renal failure superimposed on stage 3a chronic kidney disease (HCC)   Acute UTI   Right obstructing UPJ stone/UTI: Presented with nausea, vomiting, fatigue, dysuria.  UA was suspicious for UTI.  Imaging showed 9 mm right UPJ stone with moderate hydronephrosis.  Patient was started on antibiotics, IV fluids per urology consulted.  Status post cystoscopy, right-sided ureteral stent placement on 11/2.  Cultures pending.  Continue current antibiotics.  Currently on ceftriaxone.  Currently she is afebrile, hemodynamically stable.  She needs to follow-up with urology as an outpatient.  We can remove Foley tomorrow  Paroxysmal atrial fibrillation: Currently in normal sinus rhythm.  Currently on lisinopril history of falls.  On Coreg for rate control, currently on hold due to low blood pressure and bradycardia  AKI on CKD stage II: Baseline creatinine of around 1.2-1.4 presented with creatinine of 1.6, monitor kidney function, on gentle IV fluids.  Kidney function back to baseline  now.  Thrombocytopenia: Mild, likely associated with infection.  Continue to monitor  Cognitive deficits/TIA: Continue delirium precaution, frequent reorientation.  Takes Lipitor and aspirin for TIA  Pancreatic cyst: CT showed slowly enlarging bilobed cyst in the pancreatic head, currently 2.7 x 1.5 cm.  We recommend continuous monitoring as an outpatient  Advanced age/debility/deconditioning: Will consult PT/OT.  Patient is [place].  Has per daughter, she is mostly nonambulatory        DVT prophylaxis:SCDs Start: 03/19/23 0012     Code Status: Full Code  Family Communication: Discussed with daughter at bedside on 11/2  Patient status: Inpatient  Patient is from : SNF  Anticipated discharge to:SNF  Estimated DC date:1-2 days   Consultants: Urology  Procedures: Urine stent placement  Antimicrobials:  Anti-infectives (From admission, onward)    Start     Dose/Rate Route Frequency Ordered Stop   03/19/23 2200  cefTRIAXone (ROCEPHIN) 1 g in sodium chloride 0.9 % 100 mL IVPB        1 g 200 mL/hr over 30 Minutes Intravenous Every 24 hours 03/19/23 0013     03/18/23 2345  cefTRIAXone (ROCEPHIN) 1 g in sodium chloride 0.9 % 100 mL IVPB        1 g 200 mL/hr over 30 Minutes Intravenous  Once 03/18/23 2344 03/19/23 0107       Subjective: Patient seen and examined at bedside today.  Hemodynamically stable.  Comfortable, lying in bed.  She was sleeping deeply.  Confused.  Not agitated.  Abdomen was soft and nontender  Objective: Vitals:   03/19/23 0535 03/19/23 0550 03/19/23 0624 03/19/23 0744  BP: 108/72 114/65 103/78 127/60  Pulse: 70 72 71 64  Resp: (!) 31 19 18 17   Temp:  (!) 97.5 F (36.4 C) 98 F (36.7 C) 97.9 F (36.6 C)  TempSrc:   Oral Oral  SpO2: 96% 95% 99% 99%  Weight:   56.4 kg   Height:   5' (1.524 m)     Intake/Output Summary (Last 24 hours) at 03/19/2023 0753 Last data filed at 03/19/2023 0550 Gross per 24 hour  Intake 1150 ml  Output  350 ml  Net 800 ml   Filed Weights   03/19/23 0624  Weight: 56.4 kg    Examination:  General exam: Overall comfortable, not in distress, confused HEENT: PERRL Respiratory system:  no wheezes or crackles  Cardiovascular system: S1 & S2 heard, RRR.  Gastrointestinal system: Abdomen is nondistended, soft and nontender. Central nervous system: Alert and awake but not oriented Extremities: No edema, no clubbing ,no cyanosis Skin: No rashes, no ulcers,no icterus     GU: Foley catheter   Data Reviewed: I have personally reviewed following labs and imaging studies  CBC: Recent Labs  Lab 03/18/23 2100  WBC 12.5*  HGB 11.2*  HCT 37.5  MCV 103.9*  PLT 142*   Basic Metabolic Panel: Recent Labs  Lab 03/18/23 2100  NA 136  K 4.9  CL 103  CO2 22  GLUCOSE 180*  BUN 29*  CREATININE 1.63*  CALCIUM 9.3     Recent Results (from the past 240 hour(s))  Surgical PCR screen     Status: Abnormal   Collection Time: 03/19/23  2:54 AM   Specimen: Nasal Mucosa; Nasal Swab  Result Value Ref Range Status   MRSA, PCR POSITIVE (A) NEGATIVE Final    Comment: RESULT CALLED TO, READ BACK BY AND VERIFIED WITH: S. VELDEZ RN 03/19/23 @ 0616 BY AB    Staphylococcus aureus POSITIVE (A) NEGATIVE Final    Comment: (NOTE) The Xpert SA Assay (FDA approved for NASAL specimens in patients 75 years of age and older), is one component of a comprehensive surveillance program. It is not intended to diagnose infection nor to guide or monitor treatment. Performed at [facility], [address], [place], [place] [address]      Radiology Studies: DG C-Arm 1-60 Min-No Report  Result Date: 03/19/2023 Fluoroscopy was utilized by the requesting physician.  No radiographic interpretation.   CT Head Wo Contrast  Result Date: 03/18/2023 CLINICAL DATA:  Altered mental status, vomiting EXAM: CT HEAD WITHOUT CONTRAST TECHNIQUE: Contiguous axial images were obtained from the base of the skull through  the vertex without intravenous contrast. RADIATION DOSE REDUCTION: This exam was performed according to the departmental dose-optimization program which includes automated exposure control, adjustment of the mA and/or kV according to patient size and/or use of iterative reconstruction technique. COMPARISON:  05/14/2021 FINDINGS: Brain: There is atrophy and chronic small vessel disease changes. No acute intracranial abnormality. Specifically, no hemorrhage, hydrocephalus, mass lesion, acute infarction, or significant intracranial injury. Vascular: No hyperdense vessel or unexpected calcification. Skull: No acute calvarial abnormality. Sinuses/Orbits: No acute findings Other: None IMPRESSION: Atrophy, chronic microvascular disease. No acute intracranial abnormality. Electronically Signed   By: Charlett Nose M.D.   On: 03/18/2023 23:22   CT ABDOMEN PELVIS W CONTRAST  Result Date: 03/18/2023 CLINICAL DATA:  Nausea, vomiting, abdominal pain EXAM: CT ABDOMEN AND PELVIS WITH CONTRAST TECHNIQUE: Multidetector CT imaging of the abdomen and pelvis was performed using the standard protocol following bolus administration of intravenous contrast. RADIATION DOSE REDUCTION: This exam was performed according to the departmental  dose-optimization program which includes automated exposure control, adjustment of the mA and/or kV according to patient size and/or use of iterative reconstruction technique. CONTRAST:  60mL OMNIPAQUE IOHEXOL 350 MG/ML SOLN COMPARISON:  05/14/2021 FINDINGS: Lower chest: Small right pleural effusion. Dependent atelectasis bilaterally. Heart borderline in size. Coronary artery and aortic atherosclerosis. Hepatobiliary: No focal hepatic abnormality. Gallbladder unremarkable. Pancreas: Bilobed cystic area again noted in the pancreatic head measuring 2.7 x 1.5 cm compared to 2.0 x 1.0 cm previously. No pancreatic ductal dilatation or surrounding inflammation. Spleen: No focal abnormality.  Normal size.  Adrenals/Urinary Tract: Adrenal glands normal. Large right mid to upper pole cyst measuring up to 8 cm compared to 8 cm previously. 9 mm stone at the right UPJ with moderate right hydronephrosis. Multiple small layering stones in the dilated renal pelvis. No stones or hydronephrosis on the left. Urinary bladder decompressed. Stomach/Bowel: Distention of the stomach with gas and fluid. Small bowel decompressed. Large bowel grossly unremarkable. Vascular/Lymphatic: Aortoiliac atherosclerosis. No evidence of aneurysm or adenopathy. Reproductive: Prior hysterectomy.  No adnexal masses. Other: No free fluid or free air. Musculoskeletal: No acute bony abnormality. IMPRESSION: Small right pleural effusion. 9 mm right UPJ stone with moderate right hydronephrosis. Multiple layering stones in the right renal pelvis. Slowly enlarging bilobed cyst in the pancreatic head, currently 2.7 x 1.5 cm. Aortoiliac atherosclerosis. Electronically Signed   By: Charlett Nose M.D.   On: 03/18/2023 23:21    Scheduled Meds:  atorvastatin  20 mg Oral Daily   brimonidine  1 drop Both Eyes BID   And   timolol  1 drop Both Eyes BID   Chlorhexidine Gluconate Cloth  6 each Topical Q0600   latanoprost  1 drop Both Eyes QHS   mupirocin ointment  1 Application Nasal BID   oxybutynin  10 mg Oral QHS   sodium chloride flush  3 mL Intravenous Q12H   Continuous Infusions:  sodium chloride 80 mL/hr at 03/19/23 0109   cefTRIAXone (ROCEPHIN)  IV       LOS: 0 days   Burnadette Pop, MD [facility] P11/06/2022, 7:53 AM

## 2023-03-19 NOTE — H&P (View-Only) (Signed)
Urology Consult Note   Requesting Attending Physician:  Briscoe Deutscher, MD Service Providing Consult: Urology   Reason for Consult:  Nephrolithiasis  HPI: Holly Nunez is seen in consultation for reasons noted above at the request of Opyd, Lavone Neri, MD for obstructing right UPJ stone.   Patient is a 89 year old female with past medical history of hypertension, CKD 3, atrial fibrillation previously managed with Xarelto but stopped due to recurrent falls, who presents with 24-48 hours of nausea and vomiting and slightly altered mental status, found to have a 9 mm right UPJ stone with moderate hydronephrosis.  In the emergency department, laboratory work significant for an AKI of 1.6, which appears elevated from baseline of 0.9.  She has a white count of 12.5.  Her urinalysis is grossly positive for both leukocyte Estrace and nitrite, greater than 50 white blood cells, and numerous bacteria.  She is hemodynamically and has not had any fevers.  Her pressures have been slightly soft, but stable since admission.  She is currently receiving ceftriaxone and IV resuscitation.  Urine cultures pending.    Past Medical History: Past Medical History:  Diagnosis Date   Arthritis    Estrogen deficiency 05/27/2016   Great toe pain, left 12/02/2016   H/O measles    H/O mumps    History of chicken pox    Hyperglycemia 05/27/2016   Hyperlipidemia    Hypertension    Patent foramen ovale    Preventative health care 02/26/2015   Renal insufficiency    Thyroid nodule    Biopsy-negative   TIA (transient ischemic attack)    Urinary incontinence     Past Surgical History:  Past Surgical History:  Procedure Laterality Date   ABDOMINAL HYSTERECTOMY     fibroid   APPENDECTOMY     EYE SURGERY Bilateral    CATARACT REMOVAL   JOINT REPLACEMENT     REPLACEMENT TOTAL KNEE Right 2007   THYROID LOBECTOMY Left 04/25/2015   THYROID LOBECTOMY Left 04/25/2015   Procedure: LEFT THYROID LOBECTOMY;  Surgeon:  Darnell Level, MD;  Location[facility];  Service: General;  Laterality: Left;   TRIGGER FINGER RELEASE  2007    Medication: Current Facility-Administered Medications  Medication Dose Route Frequency Provider Last Rate Last Admin   0.9 %  sodium chloride infusion   Intravenous Continuous Opyd, Lavone Neri, MD       acetaminophen (TYLENOL) tablet 650 mg  650 mg Oral Q6H PRN Opyd, Lavone Neri, MD       Or   acetaminophen (TYLENOL) suppository 650 mg  650 mg Rectal Q6H PRN Opyd, Lavone Neri, MD       atorvastatin (LIPITOR) tablet 20 mg  20 mg Oral Daily Opyd, Lavone Neri, MD       brimonidine-timolol (COMBIGAN) 0.2-0.5 % ophthalmic solution 1 drop  1 drop Both Eyes Q12H Opyd, Lavone Neri, MD       cefTRIAXone (ROCEPHIN) 1 g in sodium chloride 0.9 % 100 mL IVPB  1 g Intravenous Once Opyd, Lavone Neri, MD 200 mL/hr at 03/19/23 0014 1 g at 03/19/23 0014   cefTRIAXone (ROCEPHIN) 1 g in sodium chloride 0.9 % 100 mL IVPB  1 g Intravenous Q24H Opyd, Timothy S, MD       Dextran 70-Hypromellose 0.1-0.3 % SOLN 2 drop  2 drop Ophthalmic Q12H PRN Opyd, Lavone Neri, MD       latanoprost (XALATAN) 0.005 % ophthalmic solution 1 drop  1 drop Both Eyes QHS Opyd, Lavone Neri, MD  oxybutynin (DITROPAN-XL) 24 hr tablet 10 mg  10 mg Oral QHS Opyd, Lavone Neri, MD       prochlorperazine (COMPAZINE) injection 5 mg  5 mg Intravenous Q6H PRN Opyd, Lavone Neri, MD       senna-docusate (Senokot-S) tablet 1 tablet  1 tablet Oral QHS PRN Opyd, Lavone Neri, MD       sodium chloride flush (NS) 0.9 % injection 3 mL  3 mL Intravenous Q12H Opyd, Lavone Neri, MD       Current Outpatient Medications  Medication Sig Dispense Refill   acetaminophen (TYLENOL) 650 MG CR tablet Take 2 tablets (1,300 mg total) by mouth every 6 (six) hours as needed for pain (for mild pain).     aspirin EC 81 MG tablet Take 81 mg by mouth daily. Swallow whole.     atorvastatin (LIPITOR) 20 MG tablet Take 20 mg by mouth daily.     carvedilol (COREG) 3.125 MG tablet TAKE 2  TABLETS(6.25 MG) BY MOUTH TWICE DAILY (Patient taking differently: Take 2 tablets by mouth in the morning and at bedtime. TAKE 2 TABLETS(6.25 MG) BY MOUTH TWICE DAILY) 360 tablet 1   COMBIGAN 0.2-0.5 % ophthalmic solution Place 1 drop into both eyes every 12 (twelve) hours.     Dextran 70-Hypromellose 0.1-0.3 % SOLN Apply 2 drops to eye every 12 (twelve) hours as needed (dry, itchy, irritated eyes).     dextromethorphan-guaiFENesin (ROBITUSSIN-DM) 10-100 MG/5ML liquid Take 10 mLs by mouth every 6 (six) hours as needed for cough.     folic acid (FOLVITE) 1 MG tablet Take 1 mg by mouth daily.     latanoprost (XALATAN) 0.005 % ophthalmic solution Place 1 drop into both eyes at bedtime.     Menthol, Topical Analgesic, (BIOFREEZE COOL THE PAIN) 4 % GEL Apply 1 Application topically in the morning, at noon, and at bedtime.     Multiple Vitamin (MULTIVITAMIN) tablet Take 1 tablet by mouth daily.     nitroGLYCERIN (NITROSTAT) 0.4 MG SL tablet Place 1 tablet (0.4 mg total) under the tongue every 5 (five) minutes as needed for chest pain. 25 tablet 11   Omega-3 Fatty Acids (FISH OIL PO) Take 1 capsule by mouth daily.     oxybutynin (DITROPAN-XL) 10 MG 24 hr tablet TAKE 1 TABLET(10 MG) BY MOUTH AT BEDTIME (Patient taking differently: Take 10 mg by mouth at bedtime. TAKE 1 TABLET(10 MG) BY MOUTH AT BEDTIME) 90 tablet 1   polyethylene glycol (MIRALAX / GLYCOLAX) 17 g packet Take 17 g by mouth daily.     Probiotic Product (PROBIOTIC PO) Take 1 tablet by mouth daily.     Trolamine Salicylate (ASPERCREME EX) Apply 1 application  topically in the morning and at bedtime.     Simethicone (GAS-X PO) Take 1 tablet by mouth at bedtime as needed (heartburn/gas). (Patient not taking: Reported on 03/18/2023)      Allergies: Allergies  Allergen Reactions   Alphagan [Brimonidine] Other (See Comments)    Secondary infection and vision changes    Social History: Social History   Tobacco Use   Smoking status: Never    Smokeless tobacco: Never  Vaping Use   Vaping status: Never Used  Substance Use Topics   Alcohol use: No   Drug use: No    Family History Family History  Problem Relation Age of Onset   Heart disease Mother    Hypertension Mother    Kidney disease Sister    Mental illness Sister  suicide, depresion   Gout Brother    Hypertension Son    Cancer Maternal Grandmother    Cancer Sister    Bone cancer Sister    Cancer Sister    Cancer Sister        kidney   Cancer Daughter    Pancreatic cancer Daughter    Thyroid disease Daughter    Thyroid disease Daughter        thyroid nodules   Hypertension Daughter    Irritable bowel syndrome Daughter    Cancer Daughter        thyroid   Cancer Brother    Prostate cancer Brother     Review of Systems 10 systems were reviewed and are negative except as noted specifically in the HPI.  Objective   Vital signs in last 24 hours: BP (!) 105/52 (BP Location: Right Arm)   Pulse (!) 54   Temp 97.8 F (36.6 C) (Oral)   Resp 17   SpO2 99%   Physical Exam General: NAD, A&O, resting HEENT: [place]/AT, EOMI, MMM Pulmonary: Normal work of breathing Cardiovascular: HDS, adequate peripheral perfusion Abdomen: Soft, NTTP, nondistended. GU: voiding spontaneously Extremities: warm and well perfused Neuro: Appropriate, no focal neurological deficits  Most Recent Labs: Lab Results  Component Value Date   WBC 12.5 (H) 03/18/2023   HGB 11.2 (L) 03/18/2023   HCT 37.5 03/18/2023   PLT 142 (L) 03/18/2023    Lab Results  Component Value Date   NA 136 03/18/2023   K 4.9 03/18/2023   CL 103 03/18/2023   CO2 22 03/18/2023   BUN 29 (H) 03/18/2023   CREATININE 1.63 (H) 03/18/2023   CALCIUM 9.3 03/18/2023   MG 1.9 05/18/2021   PHOS 3.7 05/18/2021    Lab Results  Component Value Date   INR 1.13 04/25/2015     Urine Culture: @[id number] (laburin,org,[id number],[id number])@   IMAGING: CT Head Wo Contrast  Result Date: 03/18/2023 CLINICAL  DATA:  Altered mental status, vomiting EXAM: CT HEAD WITHOUT CONTRAST TECHNIQUE: Contiguous axial images were obtained from the base of the skull through the vertex without intravenous contrast. RADIATION DOSE REDUCTION: This exam was performed according to the departmental dose-optimization program which includes automated exposure control, adjustment of the mA and/or kV according to patient size and/or use of iterative reconstruction technique. COMPARISON:  05/14/2021 FINDINGS: Brain: There is atrophy and chronic small vessel disease changes. No acute intracranial abnormality. Specifically, no hemorrhage, hydrocephalus, mass lesion, acute infarction, or significant intracranial injury. Vascular: No hyperdense vessel or unexpected calcification. Skull: No acute calvarial abnormality. Sinuses/Orbits: No acute findings Other: None IMPRESSION: Atrophy, chronic microvascular disease. No acute intracranial abnormality. Electronically Signed   By: Charlett Nose M.D.   On: 03/18/2023 23:22   CT ABDOMEN PELVIS W CONTRAST  Result Date: 03/18/2023 CLINICAL DATA:  Nausea, vomiting, abdominal pain EXAM: CT ABDOMEN AND PELVIS WITH CONTRAST TECHNIQUE: Multidetector CT imaging of the abdomen and pelvis was performed using the standard protocol following bolus administration of intravenous contrast. RADIATION DOSE REDUCTION: This exam was performed according to the departmental dose-optimization program which includes automated exposure control, adjustment of the mA and/or kV according to patient size and/or use of iterative reconstruction technique. CONTRAST:  60mL OMNIPAQUE IOHEXOL 350 MG/ML SOLN COMPARISON:  05/14/2021 FINDINGS: Lower chest: Small right pleural effusion. Dependent atelectasis bilaterally. Heart borderline in size. Coronary artery and aortic atherosclerosis. Hepatobiliary: No focal hepatic abnormality. Gallbladder unremarkable. Pancreas: Bilobed cystic area again noted in the pancreatic head measuring 2.7 x  1.5 cm  compared to 2.0 x 1.0 cm previously. No pancreatic ductal dilatation or surrounding inflammation. Spleen: No focal abnormality.  Normal size. Adrenals/Urinary Tract: Adrenal glands normal. Large right mid to upper pole cyst measuring up to 8 cm compared to 8 cm previously. 9 mm stone at the right UPJ with moderate right hydronephrosis. Multiple small layering stones in the dilated renal pelvis. No stones or hydronephrosis on the left. Urinary bladder decompressed. Stomach/Bowel: Distention of the stomach with gas and fluid. Small bowel decompressed. Large bowel grossly unremarkable. Vascular/Lymphatic: Aortoiliac atherosclerosis. No evidence of aneurysm or adenopathy. Reproductive: Prior hysterectomy.  No adnexal masses. Other: No free fluid or free air. Musculoskeletal: No acute bony abnormality. IMPRESSION: Small right pleural effusion. 9 mm right UPJ stone with moderate right hydronephrosis. Multiple layering stones in the right renal pelvis. Slowly enlarging bilobed cyst in the pancreatic head, currently 2.7 x 1.5 cm. Aortoiliac atherosclerosis. Electronically Signed   By: Charlett Nose M.D.   On: 03/18/2023 23:21    ------  Assessment:  89 y.o. female with 9 mm right UPJ obstructing stone with concern for both infection and obstruction.  Indications for acute intervention including infected obstruction, bilateral ureteral obstruction or unilateral obstruction of solitary kidney as well as other less urgent indications for decompression which would included intractable pain, N/V, and acute renal injury.  In the setting of inability to place a ureteral stent, the next intervention recommended would be a percutaneous nephrostomy tube.   Although patient is currently hemodynamically stable, given her age and likely lack of significant reserve, would benefit from urgent ureteral stent placement in setting of urinary tract infection and obsruction.  Will plan for admitting to hospitalist team with  stent placement overnight  Recommendations: Plan for urgent right ureteral stent placement Case posted; Urology will discuss with anesthesia Keep patient NPO Consent to be obtained from patient at bedside Follow-up urine culture Receiving ceftriaxone for antibiotics If patient clinically worsens, please place Foley catheter for decompression  Thank you for this consult. Please contact the urology consult pager with any further questions/concerns.

## 2023-03-19 NOTE — Interval H&P Note (Signed)
History and Physical Interval Note:  03/19/2023 4:11 AM  Holly Nunez  has presented today for surgery, with the diagnosis of RIGHT URETERAL KIDNEY STONE.  The various methods of treatment have been discussed with the patient and family. After consideration of risks, benefits and other options for treatment, the patient has consented to  Procedure(s): CYSTO-URETEROSCOPY; RIGHT URETERAL STENT PLACEMENT (Right) as a surgical intervention.  The patient's history has been reviewed, patient examined, no change in status, stable for surgery.  I have reviewed the patient's chart and labs.  Questions were answered to the patient's satisfaction.     Di Kindle

## 2023-03-19 NOTE — Anesthesia Preprocedure Evaluation (Addendum)
Anesthesia Evaluation  Patient identified by MRN, date of birth, ID band Patient awake    Reviewed: Allergy & Precautions, NPO status , Patient's Chart, lab work & pertinent test results, reviewed documented beta blocker date and time   History of Anesthesia Complications Negative for: history of anesthetic complications  Airway Mallampati: III  TM Distance: >3 FB Neck ROM: Full    Dental  (+) Poor Dentition, Missing, Chipped, Dental Advisory Given   Pulmonary neg pulmonary ROS   breath sounds clear to auscultation       Cardiovascular hypertension, Pt. on medications and Pt. on home beta blockers (-) angina + Valvular Problems/Murmurs (PFO'14 ECHO:  patent foramen ovale and atrial septal aneurysm)  Rhythm:Regular Rate:Normal  '22 ECHO: EF 60 to 65%.  1. LV EF 62.7 %. The LV has normal function, no regional wall motion abnormalities. There is moderate concentric LVH. Grade II diastolic dysfunction  (pseudonormalization). Elevated left ventricular end-diastolic pressure.   2. RVF is normal. The right ventricular size is normal. There is mildly elevated pulmonary artery systolic pressure. The estimated right ventricular systolic pressure is 44.7 mmHg.   3. Left atrial size was mildly dilated.   4. The mitral valve is degenerative. Trivial MR. No evidence of mitral stenosis.   5. The aortic valve is tricuspid. There is mild calcification of the aortic valve. There is mild thickening of the aortic valve. Aortic valve regurgitation is not visualized. Aortic valve sclerosis is present, with no evidence of aortic valve stenosis.     Neuro/Psych TIA   GI/Hepatic Neg liver ROS,,,N/v/diarrhea with this stone   Endo/Other  negative endocrine ROS    Renal/GU Renal InsufficiencyRenal disease     Musculoskeletal  (+) Arthritis ,    Abdominal   Peds  Hematology Hb 11.2, plt 142k   Anesthesia Other Findings    Reproductive/Obstetrics                              Anesthesia Physical Anesthesia Plan  ASA: 3  Anesthesia Plan: General   Post-op Pain Management: Ofirmev IV (intra-op)*   Induction: Intravenous and Rapid sequence  PONV Risk Score and Plan: 3 and Ondansetron, Dexamethasone and Treatment may vary due to age or medical condition  Airway Management Planned: Oral ETT  Additional Equipment: None  Intra-op Plan:   Post-operative Plan: Extubation in OR  Informed Consent: I have reviewed the patients History and Physical, chart, labs and discussed the procedure including the risks, benefits and alternatives for the proposed anesthesia with the patient or authorized representative who has indicated his/her understanding and acceptance.     Dental advisory given and Consent reviewed with POA  Plan Discussed with: Surgeon and CRNA  Anesthesia Plan Comments:          Anesthesia Quick Evaluation

## 2023-03-19 NOTE — ED Notes (Signed)
ED TO INPATIENT HANDOFF REPORT  ED Nurse Name and Phone #: Oneida Alar  S Name/Age/Gender Holly Nunez 89 y.o. female Room/Bed: 010C/010C  Code Status   Code Status: Full Code  Home/SNF/Other Home Patient oriented to: self Is this baseline? Yes   Triage Complete: Triage complete  Chief Complaint Obstruction of right ureteropelvic junction (UPJ) due to stone [N20.1]  Triage Note Pt presents via EMS c/o intermittent altered mental status. EMS reports pt more alert with EMS prior to arrival.   Pt from [facility].    Allergies Allergies  Allergen Reactions   Alphagan [Brimonidine] Other (See Comments)    Secondary infection and vision changes    Level of Care/Admitting Diagnosis ED Disposition     ED Disposition  Admit   Condition  --   Comment  Hospital Area: [facility] [[id number]]  Level of Care: Telemetry Medical [104]  May admit patient to [facility] or [facility] if equivalent level of care is available:: No  Covid Evaluation: Asymptomatic - no recent exposure (last 10 days) testing not required  Diagnosis: Obstruction of right ureteropelvic junction (UPJ) due to stone [1500151]  Admitting Physician: Briscoe Deutscher [[id number]]  Attending Physician: Briscoe Deutscher [[id number]]  Bed request comments: Due to OR availability, urology now wants patient admitted to [facility] for procedure  Certification:: I certify this patient will need inpatient services for at least 2 midnights  Expected Medical Readiness: 03/21/2023          B Medical/Surgery History Past Medical History:  Diagnosis Date   Arthritis    Estrogen deficiency 05/27/2016   Great toe pain, left 12/02/2016   H/O measles    H/O mumps    History of chicken pox    Hyperglycemia 05/27/2016   Hyperlipidemia    Hypertension    Patent foramen ovale    Preventative health care 02/26/2015   Renal insufficiency    Thyroid nodule    Biopsy-negative   TIA (transient ischemic attack)     Urinary incontinence    Past Surgical History:  Procedure Laterality Date   ABDOMINAL HYSTERECTOMY     fibroid   APPENDECTOMY     EYE SURGERY Bilateral    CATARACT REMOVAL   JOINT REPLACEMENT     REPLACEMENT TOTAL KNEE Right 2007   THYROID LOBECTOMY Left 04/25/2015   THYROID LOBECTOMY Left 04/25/2015   Procedure: LEFT THYROID LOBECTOMY;  Surgeon: Darnell Level, MD;  Location: [facility] OR;  Service: General;  Laterality: Left;   TRIGGER FINGER RELEASE  2007     A IV Location/Drains/Wounds Patient Lines/Drains/Airways Status     Active Line/Drains/Airways     Name Placement date Placement time Site Days   Peripheral IV 03/18/23 18 G Distal;Posterior;Right Forearm 03/18/23  2045  Forearm  1   External Urinary Catheter 05/14/21  2300  --  674            Intake/Output Last 24 hours No intake or output data in the 24 hours ending 03/19/23 0031  Labs/Imaging Results for orders placed or performed during the hospital encounter of 03/18/23 (from the past 48 hour(s))  Comprehensive metabolic panel     Status: Abnormal   Collection Time: 03/18/23  9:00 PM  Result Value Ref Range   Sodium 136 135 - 145 mmol/L   Potassium 4.9 3.5 - 5.1 mmol/L    Comment: HEMOLYSIS AT THIS LEVEL MAY AFFECT RESULT   Chloride 103 98 - 111 mmol/L   CO2 22  22 - 32 mmol/L   Glucose, Bld 180 (H) 70 - 99 mg/dL    Comment: Glucose reference range applies only to samples taken after fasting for at least 8 hours.   BUN 29 (H) 8 - 23 mg/dL   Creatinine, Ser 7.82 (H) 0.44 - 1.00 mg/dL   Calcium 9.3 8.9 - 95.6 mg/dL   Total Protein 5.8 (L) 6.5 - 8.1 g/dL   Albumin 2.4 (L) 3.5 - 5.0 g/dL   AST 33 15 - 41 U/L    Comment: HEMOLYSIS AT THIS LEVEL MAY AFFECT RESULT   ALT 16 0 - 44 U/L    Comment: HEMOLYSIS AT THIS LEVEL MAY AFFECT RESULT   Alkaline Phosphatase 72 38 - 126 U/L   Total Bilirubin 1.4 (H) 0.3 - 1.2 mg/dL    Comment: HEMOLYSIS AT THIS LEVEL MAY AFFECT RESULT   GFR, Estimated 29 (L) >60 mL/min     Comment: (NOTE) Calculated using the CKD-EPI Creatinine Equation (2021)    Anion gap 11 5 - 15    Comment: Performed at [facility], [address], [place], [place] [address]  CBC     Status: Abnormal   Collection Time: 03/18/23  9:00 PM  Result Value Ref Range   WBC 12.5 (H) 4.0 - 10.5 K/uL   RBC 3.61 (L) 3.87 - 5.11 MIL/uL   Hemoglobin 11.2 (L) 12.0 - 15.0 g/dL   HCT 65.7 84.6 - 96.2 %   MCV 103.9 (H) 80.0 - 100.0 fL   MCH 31.0 26.0 - 34.0 pg   MCHC 29.9 (L) 30.0 - 36.0 g/dL   RDW 95.2 84.1 - 32.4 %   Platelets 142 (L) 150 - 400 K/uL   nRBC 0.0 0.0 - 0.2 %    Comment: Performed at [facility], [address], [place], [place] [address]  CBG monitoring, ED     Status: Abnormal   Collection Time: 03/18/23  9:53 PM  Result Value Ref Range   Glucose-Capillary 154 (H) 70 - 99 mg/dL    Comment: Glucose reference range applies only to samples taken after fasting for at least 8 hours.  Urinalysis, Routine w reflex microscopic -Urine, Catheterized     Status: Abnormal   Collection Time: 03/18/23 10:07 PM  Result Value Ref Range   Color, Urine YELLOW YELLOW   APPearance CLEAR CLEAR   Specific Gravity, Urine 1.015 1.005 - 1.030   pH 8.5 (H) 5.0 - 8.0   Glucose, UA NEGATIVE NEGATIVE mg/dL   Hgb urine dipstick LARGE (A) NEGATIVE   Bilirubin Urine SMALL (A) NEGATIVE   Ketones, ur NEGATIVE NEGATIVE mg/dL   Protein, ur >725 (A) NEGATIVE mg/dL   Nitrite POSITIVE (A) NEGATIVE   Leukocytes,Ua MODERATE (A) NEGATIVE    Comment: Performed at [facility], [address] [place], [place] [address]  Urinalysis, Microscopic (reflex)     Status: Abnormal   Collection Time: 03/18/23 10:07 PM  Result Value Ref Range   RBC / HPF 6-10 0 - 5 RBC/hpf   WBC, UA >50 0 - 5 WBC/hpf   Bacteria, UA MANY (A) NONE SEEN   Squamous Epithelial / HPF 0-5 0 - 5 /HPF    Comment: Performed at [facility], [address], [place], [place] [address]  I-Stat CG4 Lactic Acid     Status:  None   Collection Time: 03/19/23 12:21 AM  Result Value Ref Range   Lactic Acid, Venous 1.4 0.5 - 1.9 mmol/L  CT Head Wo Contrast  Result Date: 03/18/2023 CLINICAL DATA:  Altered mental status, vomiting EXAM: CT HEAD WITHOUT CONTRAST TECHNIQUE: Contiguous axial images were obtained from the base of the skull through the vertex without intravenous contrast. RADIATION DOSE REDUCTION: This exam was performed according to the departmental dose-optimization program which includes automated exposure control, adjustment of the mA and/or kV according to patient size and/or use of iterative reconstruction technique. COMPARISON:  05/14/2021 FINDINGS: Brain: There is atrophy and chronic small vessel disease changes. No acute intracranial abnormality. Specifically, no hemorrhage, hydrocephalus, mass lesion, acute infarction, or significant intracranial injury. Vascular: No hyperdense vessel or unexpected calcification. Skull: No acute calvarial abnormality. Sinuses/Orbits: No acute findings Other: None IMPRESSION: Atrophy, chronic microvascular disease. No acute intracranial abnormality. Electronically Signed   By: Charlett Nose M.D.   On: 03/18/2023 23:22   CT ABDOMEN PELVIS W CONTRAST  Result Date: 03/18/2023 CLINICAL DATA:  Nausea, vomiting, abdominal pain EXAM: CT ABDOMEN AND PELVIS WITH CONTRAST TECHNIQUE: Multidetector CT imaging of the abdomen and pelvis was performed using the standard protocol following bolus administration of intravenous contrast. RADIATION DOSE REDUCTION: This exam was performed according to the departmental dose-optimization program which includes automated exposure control, adjustment of the mA and/or kV according to patient size and/or use of iterative reconstruction technique. CONTRAST:  60mL OMNIPAQUE IOHEXOL 350 MG/ML SOLN COMPARISON:  05/14/2021 FINDINGS: Lower chest: Small right pleural effusion. Dependent atelectasis bilaterally. Heart borderline in size. Coronary artery and  aortic atherosclerosis. Hepatobiliary: No focal hepatic abnormality. Gallbladder unremarkable. Pancreas: Bilobed cystic area again noted in the pancreatic head measuring 2.7 x 1.5 cm compared to 2.0 x 1.0 cm previously. No pancreatic ductal dilatation or surrounding inflammation. Spleen: No focal abnormality.  Normal size. Adrenals/Urinary Tract: Adrenal glands normal. Large right mid to upper pole cyst measuring up to 8 cm compared to 8 cm previously. 9 mm stone at the right UPJ with moderate right hydronephrosis. Multiple small layering stones in the dilated renal pelvis. No stones or hydronephrosis on the left. Urinary bladder decompressed. Stomach/Bowel: Distention of the stomach with gas and fluid. Small bowel decompressed. Large bowel grossly unremarkable. Vascular/Lymphatic: Aortoiliac atherosclerosis. No evidence of aneurysm or adenopathy. Reproductive: Prior hysterectomy.  No adnexal masses. Other: No free fluid or free air. Musculoskeletal: No acute bony abnormality. IMPRESSION: Small right pleural effusion. 9 mm right UPJ stone with moderate right hydronephrosis. Multiple layering stones in the right renal pelvis. Slowly enlarging bilobed cyst in the pancreatic head, currently 2.7 x 1.5 cm. Aortoiliac atherosclerosis. Electronically Signed   By: Charlett Nose M.D.   On: 03/18/2023 23:21    Pending Labs Unresulted Labs (From admission, onward)     Start     Ordered   03/19/23 0500  Basic metabolic panel  Daily,   R      03/19/23 0013   03/19/23 0500  CBC  Daily,   R      03/19/23 0013   03/19/23 0012  Urine Culture (for pregnant, neutropenic or urologic patients or patients with an indwelling urinary catheter)  (Urine Culture)  Add-on,   AD       Question:  Indication  Answer:  Altered mental status (if no other cause identified)   03/19/23 0013   03/18/23 2345  Blood culture (routine x 2)  BLOOD CULTURE X 2,   R (with STAT occurrences)      03/18/23 2344             Vitals/Pain Today's Vitals   03/18/23  2102 03/18/23 2130 03/18/23 2200 03/18/23 2324  BP:  (!) 92/49 (!) 99/51 (!) 105/52  Pulse:  (!) 50 (!) 46 (!) 54  Resp:  (!) 9 16 17   Temp:      TempSrc:      SpO2:  100% 100% 99%  PainSc: 4        Isolation Precautions No active isolations  Medications Medications  cefTRIAXone (ROCEPHIN) 1 g in sodium chloride 0.9 % 100 mL IVPB (1 g Intravenous New Bag/Given 03/19/23 0014)  atorvastatin (LIPITOR) tablet 20 mg (has no administration in time range)  oxybutynin (DITROPAN-XL) 24 hr tablet 10 mg (has no administration in time range)  brimonidine-timolol (COMBIGAN) 0.2-0.5 % ophthalmic solution 1 drop (has no administration in time range)  Dextran 70-Hypromellose 0.1-0.3 % SOLN 2 drop (has no administration in time range)  latanoprost (XALATAN) 0.005 % ophthalmic solution 1 drop (has no administration in time range)  sodium chloride flush (NS) 0.9 % injection 3 mL (has no administration in time range)  acetaminophen (TYLENOL) tablet 650 mg (has no administration in time range)    Or  acetaminophen (TYLENOL) suppository 650 mg (has no administration in time range)  0.9 %  sodium chloride infusion (has no administration in time range)  senna-docusate (Senokot-S) tablet 1 tablet (has no administration in time range)  prochlorperazine (COMPAZINE) injection 5 mg (has no administration in time range)  cefTRIAXone (ROCEPHIN) 1 g in sodium chloride 0.9 % 100 mL IVPB (has no administration in time range)  iohexol (OMNIPAQUE) 350 MG/ML injection 75 mL (60 mLs Intravenous Contrast Given 03/18/23 2236)  sodium chloride 0.9 % bolus 1,000 mL (1,000 mLs Intravenous New Bag/Given 03/19/23 0015)    Mobility manual wheelchair     Focused Assessments Cardiac Assessment Handoff:    Lab Results  Component Value Date   CKTOTAL 196 05/15/2021   No results found for: "DDIMER" Does the Patient currently have chest pain?   , Neuro Assessment  Handoff:  Swallow screen pass?          Neuro Assessment: Exceptions to WDL Neuro Checks:      Has TPA been given?  If patient is a Neuro Trauma and patient is going to OR before floor call report to 4N Charge nurse: [phone] or [phone]  , Renal Assessment Handoff:  Hemodialysis Schedule:  Last Hemodialysis date and time:   Restricted appendage:   , Pulmonary Assessment Handoff:  Lung sounds:          R Recommendations: See Admitting Provider Note  Report given to:   Additional Notes:   Came in Via EMS for altered mental status with moments of alertness. Confirmed UTI with kidney stone. Urine appeared was dark and thick in texture. Will have surgery/stent place due to possible blockage.

## 2023-03-19 NOTE — Anesthesia Postprocedure Evaluation (Signed)
Anesthesia Post Note  Patient: Holly Nunez  Procedure(s) Performed: CYSTO-URETEROSCOPY; RIGHT URETERAL STENT PLACEMENT (Right: Urethra)     Patient location during evaluation: PACU Anesthesia Type: General Level of consciousness: patient cooperative, oriented and sedated Pain management: pain level controlled Vital Signs Assessment: post-procedure vital signs reviewed and stable Respiratory status: spontaneous breathing, nonlabored ventilation and respiratory function stable Cardiovascular status: blood pressure returned to baseline and stable Postop Assessment: no apparent nausea or vomiting Anesthetic complications: no   No notable events documented.  Last Vitals:  Vitals:   03/19/23 0550 03/19/23 0624  BP: 114/65 103/78  Pulse: 72 71  Resp: 19 18  Temp: (!) 36.4 C 36.7 C  SpO2: 95% 99%    Last Pain:  Vitals:   03/19/23 0624  TempSrc: Oral  PainSc:                  Rayansh Herbst,E. Lougenia Morrissey

## 2023-03-19 NOTE — Plan of Care (Signed)
Pt received with daughter at bedside. Pt is alert x 1 to self. Vomited once minimal amount clear output. PRN meds were given. Call light in reach. Plan of care ongoing.  Problem: Pain Management: Goal: General experience of comfort will improve Outcome: Progressing   Problem: Skin Integrity: Goal: Risk for impaired skin integrity will decrease Outcome: Progressing   Problem: Safety: Goal: Ability to remain free from injury will improve Outcome: Progressing

## 2023-03-19 NOTE — Consult Note (Signed)
Urology Consult Note   Requesting Attending Physician:  Briscoe Deutscher, MD Service Providing Consult: Urology   Reason for Consult:  Nephrolithiasis  HPI: Holly Nunez is seen in consultation for reasons noted above at the request of Opyd, Lavone Neri, MD for obstructing right UPJ stone.   Patient is a 89 year old female with past medical history of hypertension, CKD 3, atrial fibrillation previously managed with Xarelto but stopped due to recurrent falls, who presents with 24-48 hours of nausea and vomiting and slightly altered mental status, found to have a 9 mm right UPJ stone with moderate hydronephrosis.  In the emergency department, laboratory work significant for an AKI of 1.6, which appears elevated from baseline of 0.9.  She has a white count of 12.5.  Her urinalysis is grossly positive for both leukocyte Estrace and nitrite, greater than 50 white blood cells, and numerous bacteria.  She is hemodynamically and has not had any fevers.  Her pressures have been slightly soft, but stable since admission.  She is currently receiving ceftriaxone and IV resuscitation.  Urine cultures pending.    Past Medical History: Past Medical History:  Diagnosis Date   Arthritis    Estrogen deficiency 05/27/2016   Great toe pain, left 12/02/2016   H/O measles    H/O mumps    History of chicken pox    Hyperglycemia 05/27/2016   Hyperlipidemia    Hypertension    Patent foramen ovale    Preventative health care 02/26/2015   Renal insufficiency    Thyroid nodule    Biopsy-negative   TIA (transient ischemic attack)    Urinary incontinence     Past Surgical History:  Past Surgical History:  Procedure Laterality Date   ABDOMINAL HYSTERECTOMY     fibroid   APPENDECTOMY     EYE SURGERY Bilateral    CATARACT REMOVAL   JOINT REPLACEMENT     REPLACEMENT TOTAL KNEE Right 2007   THYROID LOBECTOMY Left 04/25/2015   THYROID LOBECTOMY Left 04/25/2015   Procedure: LEFT THYROID LOBECTOMY;  Surgeon:  Darnell Level, MD;  Location: [facility] OR;  Service: General;  Laterality: Left;   TRIGGER FINGER RELEASE  2007    Medication: Current Facility-Administered Medications  Medication Dose Route Frequency Provider Last Rate Last Admin   0.9 %  sodium chloride infusion   Intravenous Continuous Opyd, Lavone Neri, MD       acetaminophen (TYLENOL) tablet 650 mg  650 mg Oral Q6H PRN Opyd, Lavone Neri, MD       Or   acetaminophen (TYLENOL) suppository 650 mg  650 mg Rectal Q6H PRN Opyd, Lavone Neri, MD       atorvastatin (LIPITOR) tablet 20 mg  20 mg Oral Daily Opyd, Lavone Neri, MD       brimonidine-timolol (COMBIGAN) 0.2-0.5 % ophthalmic solution 1 drop  1 drop Both Eyes Q12H Opyd, Lavone Neri, MD       cefTRIAXone (ROCEPHIN) 1 g in sodium chloride 0.9 % 100 mL IVPB  1 g Intravenous Once Opyd, Lavone Neri, MD 200 mL/hr at 03/19/23 0014 1 g at 03/19/23 0014   cefTRIAXone (ROCEPHIN) 1 g in sodium chloride 0.9 % 100 mL IVPB  1 g Intravenous Q24H Opyd, Timothy S, MD       Dextran 70-Hypromellose 0.1-0.3 % SOLN 2 drop  2 drop Ophthalmic Q12H PRN Opyd, Lavone Neri, MD       latanoprost (XALATAN) 0.005 % ophthalmic solution 1 drop  1 drop Both Eyes QHS Opyd, Lavone Neri, MD  oxybutynin (DITROPAN-XL) 24 hr tablet 10 mg  10 mg Oral QHS Opyd, Lavone Neri, MD       prochlorperazine (COMPAZINE) injection 5 mg  5 mg Intravenous Q6H PRN Opyd, Lavone Neri, MD       senna-docusate (Senokot-S) tablet 1 tablet  1 tablet Oral QHS PRN Opyd, Lavone Neri, MD       sodium chloride flush (NS) 0.9 % injection 3 mL  3 mL Intravenous Q12H Opyd, Lavone Neri, MD       Current Outpatient Medications  Medication Sig Dispense Refill   acetaminophen (TYLENOL) 650 MG CR tablet Take 2 tablets (1,300 mg total) by mouth every 6 (six) hours as needed for pain (for mild pain).     aspirin EC 81 MG tablet Take 81 mg by mouth daily. Swallow whole.     atorvastatin (LIPITOR) 20 MG tablet Take 20 mg by mouth daily.     carvedilol (COREG) 3.125 MG tablet TAKE 2  TABLETS(6.25 MG) BY MOUTH TWICE DAILY (Patient taking differently: Take 2 tablets by mouth in the morning and at bedtime. TAKE 2 TABLETS(6.25 MG) BY MOUTH TWICE DAILY) 360 tablet 1   COMBIGAN 0.2-0.5 % ophthalmic solution Place 1 drop into both eyes every 12 (twelve) hours.     Dextran 70-Hypromellose 0.1-0.3 % SOLN Apply 2 drops to eye every 12 (twelve) hours as needed (dry, itchy, irritated eyes).     dextromethorphan-guaiFENesin (ROBITUSSIN-DM) 10-100 MG/5ML liquid Take 10 mLs by mouth every 6 (six) hours as needed for cough.     folic acid (FOLVITE) 1 MG tablet Take 1 mg by mouth daily.     latanoprost (XALATAN) 0.005 % ophthalmic solution Place 1 drop into both eyes at bedtime.     Menthol, Topical Analgesic, (BIOFREEZE COOL THE PAIN) 4 % GEL Apply 1 Application topically in the morning, at noon, and at bedtime.     Multiple Vitamin (MULTIVITAMIN) tablet Take 1 tablet by mouth daily.     nitroGLYCERIN (NITROSTAT) 0.4 MG SL tablet Place 1 tablet (0.4 mg total) under the tongue every 5 (five) minutes as needed for chest pain. 25 tablet 11   Omega-3 Fatty Acids (FISH OIL PO) Take 1 capsule by mouth daily.     oxybutynin (DITROPAN-XL) 10 MG 24 hr tablet TAKE 1 TABLET(10 MG) BY MOUTH AT BEDTIME (Patient taking differently: Take 10 mg by mouth at bedtime. TAKE 1 TABLET(10 MG) BY MOUTH AT BEDTIME) 90 tablet 1   polyethylene glycol (MIRALAX / GLYCOLAX) 17 g packet Take 17 g by mouth daily.     Probiotic Product (PROBIOTIC PO) Take 1 tablet by mouth daily.     Trolamine Salicylate (ASPERCREME EX) Apply 1 application  topically in the morning and at bedtime.     Simethicone (GAS-X PO) Take 1 tablet by mouth at bedtime as needed (heartburn/gas). (Patient not taking: Reported on 03/18/2023)      Allergies: Allergies  Allergen Reactions   Alphagan [Brimonidine] Other (See Comments)    Secondary infection and vision changes    Social History: Social History   Tobacco Use   Smoking status: Never    Smokeless tobacco: Never  Vaping Use   Vaping status: Never Used  Substance Use Topics   Alcohol use: No   Drug use: No    Family History Family History  Problem Relation Age of Onset   Heart disease Mother    Hypertension Mother    Kidney disease Sister    Mental illness Sister  suicide, depresion   Gout Brother    Hypertension Son    Cancer Maternal Grandmother    Cancer Sister    Bone cancer Sister    Cancer Sister    Cancer Sister        kidney   Cancer Daughter    Pancreatic cancer Daughter    Thyroid disease Daughter    Thyroid disease Daughter        thyroid nodules   Hypertension Daughter    Irritable bowel syndrome Daughter    Cancer Daughter        thyroid   Cancer Brother    Prostate cancer Brother     Review of Systems 10 systems were reviewed and are negative except as noted specifically in the HPI.  Objective   Vital signs in last 24 hours: BP (!) 105/52 (BP Location: Right Arm)   Pulse (!) 54   Temp 97.8 F (36.6 C) (Oral)   Resp 17   SpO2 99%   Physical Exam General: NAD, A&O, resting HEENT: [place]/AT, EOMI, MMM Pulmonary: Normal work of breathing Cardiovascular: HDS, adequate peripheral perfusion Abdomen: Soft, NTTP, nondistended. GU: voiding spontaneously Extremities: warm and well perfused Neuro: Appropriate, no focal neurological deficits  Most Recent Labs: Lab Results  Component Value Date   WBC 12.5 (H) 03/18/2023   HGB 11.2 (L) 03/18/2023   HCT 37.5 03/18/2023   PLT 142 (L) 03/18/2023    Lab Results  Component Value Date   NA 136 03/18/2023   K 4.9 03/18/2023   CL 103 03/18/2023   CO2 22 03/18/2023   BUN 29 (H) 03/18/2023   CREATININE 1.63 (H) 03/18/2023   CALCIUM 9.3 03/18/2023   MG 1.9 05/18/2021   PHOS 3.7 05/18/2021    Lab Results  Component Value Date   INR 1.13 04/25/2015     Urine Culture: @[id number] (laburin,org,r9620,[id number])@   IMAGING: CT Head Wo Contrast  Result Date: 03/18/2023 CLINICAL  DATA:  Altered mental status, vomiting EXAM: CT HEAD WITHOUT CONTRAST TECHNIQUE: Contiguous axial images were obtained from the base of the skull through the vertex without intravenous contrast. RADIATION DOSE REDUCTION: This exam was performed according to the departmental dose-optimization program which includes automated exposure control, adjustment of the mA and/or kV according to patient size and/or use of iterative reconstruction technique. COMPARISON:  05/14/2021 FINDINGS: Brain: There is atrophy and chronic small vessel disease changes. No acute intracranial abnormality. Specifically, no hemorrhage, hydrocephalus, mass lesion, acute infarction, or significant intracranial injury. Vascular: No hyperdense vessel or unexpected calcification. Skull: No acute calvarial abnormality. Sinuses/Orbits: No acute findings Other: None IMPRESSION: Atrophy, chronic microvascular disease. No acute intracranial abnormality. Electronically Signed   By: Charlett Nose M.D.   On: 03/18/2023 23:22   CT ABDOMEN PELVIS W CONTRAST  Result Date: 03/18/2023 CLINICAL DATA:  Nausea, vomiting, abdominal pain EXAM: CT ABDOMEN AND PELVIS WITH CONTRAST TECHNIQUE: Multidetector CT imaging of the abdomen and pelvis was performed using the standard protocol following bolus administration of intravenous contrast. RADIATION DOSE REDUCTION: This exam was performed according to the departmental dose-optimization program which includes automated exposure control, adjustment of the mA and/or kV according to patient size and/or use of iterative reconstruction technique. CONTRAST:  60mL OMNIPAQUE IOHEXOL 350 MG/ML SOLN COMPARISON:  05/14/2021 FINDINGS: Lower chest: Small right pleural effusion. Dependent atelectasis bilaterally. Heart borderline in size. Coronary artery and aortic atherosclerosis. Hepatobiliary: No focal hepatic abnormality. Gallbladder unremarkable. Pancreas: Bilobed cystic area again noted in the pancreatic head measuring 2.7 x  1.5 cm  compared to 2.0 x 1.0 cm previously. No pancreatic ductal dilatation or surrounding inflammation. Spleen: No focal abnormality.  Normal size. Adrenals/Urinary Tract: Adrenal glands normal. Large right mid to upper pole cyst measuring up to 8 cm compared to 8 cm previously. 9 mm stone at the right UPJ with moderate right hydronephrosis. Multiple small layering stones in the dilated renal pelvis. No stones or hydronephrosis on the left. Urinary bladder decompressed. Stomach/Bowel: Distention of the stomach with gas and fluid. Small bowel decompressed. Large bowel grossly unremarkable. Vascular/Lymphatic: Aortoiliac atherosclerosis. No evidence of aneurysm or adenopathy. Reproductive: Prior hysterectomy.  No adnexal masses. Other: No free fluid or free air. Musculoskeletal: No acute bony abnormality. IMPRESSION: Small right pleural effusion. 9 mm right UPJ stone with moderate right hydronephrosis. Multiple layering stones in the right renal pelvis. Slowly enlarging bilobed cyst in the pancreatic head, currently 2.7 x 1.5 cm. Aortoiliac atherosclerosis. Electronically Signed   By: Charlett Nose M.D.   On: 03/18/2023 23:21    ------  Assessment:  89 y.o. female with 9 mm right UPJ obstructing stone with concern for both infection and obstruction.  Indications for acute intervention including infected obstruction, bilateral ureteral obstruction or unilateral obstruction of solitary kidney as well as other less urgent indications for decompression which would included intractable pain, N/V, and acute renal injury.  In the setting of inability to place a ureteral stent, the next intervention recommended would be a percutaneous nephrostomy tube.   Although patient is currently hemodynamically stable, given her age and likely lack of significant reserve, would benefit from urgent ureteral stent placement in setting of urinary tract infection and obsruction.  Will plan for admitting to hospitalist team with  stent placement overnight  Recommendations: Plan for urgent right ureteral stent placement Case posted; Urology will discuss with anesthesia Keep patient NPO Consent to be obtained from patient at bedside Follow-up urine culture Receiving ceftriaxone for antibiotics If patient clinically worsens, please place Foley catheter for decompression  Thank you for this consult. Please contact the urology consult pager with any further questions/concerns.

## 2023-03-20 ENCOUNTER — Encounter (HOSPITAL_COMMUNITY): Payer: Self-pay | Admitting: Urology

## 2023-03-20 DIAGNOSIS — N201 Calculus of ureter: Secondary | ICD-10-CM | POA: Diagnosis not present

## 2023-03-20 LAB — BASIC METABOLIC PANEL
Anion gap: 11 (ref 5–15)
BUN: 26 mg/dL — ABNORMAL HIGH (ref 8–23)
CO2: 20 mmol/L — ABNORMAL LOW (ref 22–32)
Calcium: 9 mg/dL (ref 8.9–10.3)
Chloride: 103 mmol/L (ref 98–111)
Creatinine, Ser: 1.16 mg/dL — ABNORMAL HIGH (ref 0.44–1.00)
GFR, Estimated: 43 mL/min — ABNORMAL LOW (ref >60)
Glucose, Bld: 132 mg/dL — ABNORMAL HIGH (ref 70–99)
Potassium: 4.4 mmol/L (ref 3.5–5.1)
Sodium: 134 mmol/L — ABNORMAL LOW (ref 135–145)

## 2023-03-20 LAB — CBC
HCT: 32.1 % — ABNORMAL LOW (ref 36.0–46.0)
Hemoglobin: 10.8 g/dL — ABNORMAL LOW (ref 12.0–15.0)
MCH: 32 pg (ref 26.0–34.0)
MCHC: 33.6 g/dL (ref 30.0–36.0)
MCV: 95.3 fL (ref 80.0–100.0)
Platelets: 132 10*3/uL — ABNORMAL LOW (ref 150–400)
RBC: 3.37 MIL/uL — ABNORMAL LOW (ref 3.87–5.11)
RDW: 13.2 % (ref 11.5–15.5)
WBC: 11.1 10*3/uL — ABNORMAL HIGH (ref 4.0–10.5)
nRBC: 0 % (ref 0.0–0.2)

## 2023-03-20 NOTE — Consult Note (Signed)
Urology Consult Note   Requesting Attending Physician:  Burnadette Pop, MD Service Providing Consult: Urology   Reason for Consult:  Nephrolithiasis  HPI: Holly Nunez is seen in consultation for reasons noted above at the request of Burnadette Pop, MD for obstructing right UPJ stone.   Patient is a 89 year old female with past medical history of hypertension, CKD 3, atrial fibrillation previously managed with Xarelto but stopped due to recurrent falls, who presents with 24-48 hours of nausea and vomiting and slightly altered mental status, found to have a 9 mm right UPJ stone with moderate hydronephrosis.  In the emergency department, laboratory work significant for an AKI of 1.6, which appears elevated from baseline of 0.9.  She has a white count of 12.5.  Her urinalysis is grossly positive for both leukocyte Estrace and nitrite, greater than 50 white blood cells, and numerous bacteria.  She is hemodynamically and has not had any fevers.  Her pressures have been slightly soft, but stable since admission.  She is currently receiving ceftriaxone and IV resuscitation.  Urine cultures pending.    Past Medical History: Past Medical History:  Diagnosis Date   Arthritis    Estrogen deficiency 05/27/2016   Great toe pain, left 12/02/2016   H/O measles    H/O mumps    History of chicken pox    Hyperglycemia 05/27/2016   Hyperlipidemia    Hypertension    Patent foramen ovale    Preventative health care 02/26/2015   Renal insufficiency    Thyroid nodule    Biopsy-negative   TIA (transient ischemic attack)    Urinary incontinence     Past Surgical History:  Past Surgical History:  Procedure Laterality Date   ABDOMINAL HYSTERECTOMY     fibroid   APPENDECTOMY     CYSTOSCOPY WITH RETROGRADE PYELOGRAM, URETEROSCOPY AND STENT PLACEMENT Right 03/19/2023   Procedure: CYSTO-URETEROSCOPY; RIGHT URETERAL STENT PLACEMENT;  Surgeon: Milderd Meager., MD;  Location: [facility] OR;  Service: Urology;   Laterality: Right;   EYE SURGERY Bilateral    CATARACT REMOVAL   JOINT REPLACEMENT     REPLACEMENT TOTAL KNEE Right 2007   THYROID LOBECTOMY Left 04/25/2015   THYROID LOBECTOMY Left 04/25/2015   Procedure: LEFT THYROID LOBECTOMY;  Surgeon: Darnell Level, MD;  Location: [facility] OR;  Service: General;  Laterality: Left;   TRIGGER FINGER RELEASE  2007    Medication: Current Facility-Administered Medications  Medication Dose Route Frequency Provider Last Rate Last Admin   acetaminophen (TYLENOL) tablet 650 mg  650 mg Oral Q6H PRN Stoneking, Danford Bad., MD       Or   acetaminophen (TYLENOL) suppository 650 mg  650 mg Rectal Q6H PRN Stoneking, Danford Bad., MD       atorvastatin (LIPITOR) tablet 20 mg  20 mg Oral Daily Milderd Meager., MD   20 mg at 03/20/23 0819   brimonidine (ALPHAGAN) 0.2 % ophthalmic solution 1 drop  1 drop Both Eyes BID Milderd Meager., MD   1 drop at 03/20/23 0819   And   timolol (TIMOPTIC) 0.5 % ophthalmic solution 1 drop  1 drop Both Eyes BID Milderd Meager., MD   1 drop at 03/20/23 0820   cefTRIAXone (ROCEPHIN) 2 g in sodium chloride 0.9 % 100 mL IVPB  2 g Intravenous Q24H Burnadette Pop, MD 200 mL/hr at 03/19/23 2018 2 g at 03/19/23 2018   Chlorhexidine Gluconate Cloth 2 % PADS 6 each  6 each Topical Q0600 Burnadette Pop, MD   6  each at 03/20/23 0404   latanoprost (XALATAN) 0.005 % ophthalmic solution 1 drop  1 drop Both Eyes QHS Stoneking, Danford Bad., MD   1 drop at 03/19/23 2019   mupirocin ointment (BACTROBAN) 2 % 1 Application  1 Application Nasal BID Burnadette Pop, MD   1 Application at 03/20/23 0819   oxybutynin (DITROPAN-XL) 24 hr tablet 10 mg  10 mg Oral QHS Milderd Meager., MD   10 mg at 03/19/23 2012   polyvinyl alcohol (LIQUIFILM TEARS) 1.4 % ophthalmic solution 2 drop  2 drop Both Eyes Q12H PRN Stoneking, Danford Bad., MD       prochlorperazine (COMPAZINE) injection 5 mg  5 mg Intravenous Q6H PRN Milderd Meager., MD   5 mg at 03/19/23  4098   senna-docusate (Senokot-S) tablet 1 tablet  1 tablet Oral QHS PRN Milderd Meager., MD       sodium chloride flush (NS) 0.9 % injection 3 mL  3 mL Intravenous Q12H Milderd Meager., MD   3 mL at 03/20/23 0820    Allergies: Allergies  Allergen Reactions   Alphagan [Brimonidine] Other (See Comments)    Secondary infection and vision changes    Social History: Social History   Tobacco Use   Smoking status: Never   Smokeless tobacco: Never  Vaping Use   Vaping status: Never Used  Substance Use Topics   Alcohol use: No   Drug use: No    Family History Family History  Problem Relation Age of Onset   Heart disease Mother    Hypertension Mother    Kidney disease Sister    Mental illness Sister        suicide, depresion   Gout Brother    Hypertension Son    Cancer Maternal Grandmother    Cancer Sister    Bone cancer Sister    Cancer Sister    Cancer Sister        kidney   Cancer Daughter    Pancreatic cancer Daughter    Thyroid disease Daughter    Thyroid disease Daughter        thyroid nodules   Hypertension Daughter    Irritable bowel syndrome Daughter    Cancer Daughter        thyroid   Cancer Brother    Prostate cancer Brother     Review of Systems 10 systems were reviewed and are negative except as noted specifically in the HPI.  Objective   Vital signs in last 24 hours: BP (!) 123/56 (BP Location: Right Arm)   Pulse (!) 51   Temp 98 F (36.7 C) (Oral)   Resp 18   Ht 5' (1.524 m)   Wt 56.5 kg   SpO2 100%   BMI 24.33 kg/m   Physical Exam General: NAD, A&O, resting HEENT: [place]/AT, EOMI, MMM Pulmonary: Normal work of breathing Cardiovascular: HDS, adequate peripheral perfusion Abdomen: Soft, NTTP, nondistended. GU: voiding spontaneously Extremities: warm and well perfused Neuro: Appropriate, no focal neurological deficits  Most Recent Labs: Lab Results  Component Value Date   WBC 11.1 (H) 03/20/2023   HGB 10.8 (L) 03/20/2023    HCT 32.1 (L) 03/20/2023   PLT 132 (L) 03/20/2023    Lab Results  Component Value Date   NA 134 (L) 03/20/2023   K 4.4 03/20/2023   CL 103 03/20/2023   CO2 20 (L) 03/20/2023   BUN 26 (H) 03/20/2023   CREATININE 1.16 (H) 03/20/2023   CALCIUM 9.0 03/20/2023  MG 1.9 05/18/2021   PHOS 3.7 05/18/2021    Lab Results  Component Value Date   INR 1.13 04/25/2015     Urine Culture: @[id number] (laburin,org,r9620,r9621)@   IMAGING: DG C-Arm 1-60 Min-No Report  Result Date: 03/19/2023 Fluoroscopy was utilized by the requesting physician.  No radiographic interpretation.   CT Head Wo Contrast  Result Date: 03/18/2023 CLINICAL DATA:  Altered mental status, vomiting EXAM: CT HEAD WITHOUT CONTRAST TECHNIQUE: Contiguous axial images were obtained from the base of the skull through the vertex without intravenous contrast. RADIATION DOSE REDUCTION: This exam was performed according to the departmental dose-optimization program which includes automated exposure control, adjustment of the mA and/or kV according to patient size and/or use of iterative reconstruction technique. COMPARISON:  05/14/2021 FINDINGS: Brain: There is atrophy and chronic small vessel disease changes. No acute intracranial abnormality. Specifically, no hemorrhage, hydrocephalus, mass lesion, acute infarction, or significant intracranial injury. Vascular: No hyperdense vessel or unexpected calcification. Skull: No acute calvarial abnormality. Sinuses/Orbits: No acute findings Other: None IMPRESSION: Atrophy, chronic microvascular disease. No acute intracranial abnormality. Electronically Signed   By: Charlett Nose M.D.   On: 03/18/2023 23:22   CT ABDOMEN PELVIS W CONTRAST  Result Date: 03/18/2023 CLINICAL DATA:  Nausea, vomiting, abdominal pain EXAM: CT ABDOMEN AND PELVIS WITH CONTRAST TECHNIQUE: Multidetector CT imaging of the abdomen and pelvis was performed using the standard protocol following bolus administration of  intravenous contrast. RADIATION DOSE REDUCTION: This exam was performed according to the departmental dose-optimization program which includes automated exposure control, adjustment of the mA and/or kV according to patient size and/or use of iterative reconstruction technique. CONTRAST:  60mL OMNIPAQUE IOHEXOL 350 MG/ML SOLN COMPARISON:  05/14/2021 FINDINGS: Lower chest: Small right pleural effusion. Dependent atelectasis bilaterally. Heart borderline in size. Coronary artery and aortic atherosclerosis. Hepatobiliary: No focal hepatic abnormality. Gallbladder unremarkable. Pancreas: Bilobed cystic area again noted in the pancreatic head measuring 2.7 x 1.5 cm compared to 2.0 x 1.0 cm previously. No pancreatic ductal dilatation or surrounding inflammation. Spleen: No focal abnormality.  Normal size. Adrenals/Urinary Tract: Adrenal glands normal. Large right mid to upper pole cyst measuring up to 8 cm compared to 8 cm previously. 9 mm stone at the right UPJ with moderate right hydronephrosis. Multiple small layering stones in the dilated renal pelvis. No stones or hydronephrosis on the left. Urinary bladder decompressed. Stomach/Bowel: Distention of the stomach with gas and fluid. Small bowel decompressed. Large bowel grossly unremarkable. Vascular/Lymphatic: Aortoiliac atherosclerosis. No evidence of aneurysm or adenopathy. Reproductive: Prior hysterectomy.  No adnexal masses. Other: No free fluid or free air. Musculoskeletal: No acute bony abnormality. IMPRESSION: Small right pleural effusion. 9 mm right UPJ stone with moderate right hydronephrosis. Multiple layering stones in the right renal pelvis. Slowly enlarging bilobed cyst in the pancreatic head, currently 2.7 x 1.5 cm. Aortoiliac atherosclerosis. Electronically Signed   By: Charlett Nose M.D.   On: 03/18/2023 23:21    ------  Assessment:  89 y.o. female with 9 mm right UPJ obstructing stone with concern for both infection and obstruction.  Now s/p  interval stent placement on right on 03/19/2023.   Update 11/3:  Pt looks markedly well on exam today. HDS Labwork reassuring, creatinine downtrending. Mild leukocytosis today Blood culture PCR showing enterobacter and Proteus species, urine culture pending  Recommendations: Pt will need definitive treatment after treatment of infection Recommend 10-14 day course in setting of complicated UTI and bacteremia If pt has stent related discomfort, can consider flomax 0.4 mg daily if safe  from medical perspective Follow-up urine culture speciation and sensitivities Will defer rest of care to hospitalist team  We will continue to follow along PRN  Thank you for this consult. Please contact the urology consult pager with any further questions/concerns.

## 2023-03-20 NOTE — Progress Notes (Signed)
PROGRESS NOTE  Holly Nunez  UJW:[id number] DOB: 25-Dec-1924 DOA: 03/18/2023 PCP: Pcp, No   Brief Narrative: Patient is a 89 year old female with history of hypertension, TIA, coronary disease, atrial fibrillation not on anticoagulation, CKD stage III who presented with fatigue, dysuria, right-sided abdomen pain, nausea/vomiting from at [facility].  Lab work on presentation showed creatinine of 1.6, WBC count of 12.5.  UA was suspicious for UTI.  Imaging showed 9 mm right UPJ stone with moderate hydronephrosis.  Patient was started on antibiotics, IV fluids per urology consulted.  Status post cystoscopy, right-sided ureteral stent placement on 11/2.  Blood cultures showing gram-negative bacteremia/Proteus, waiting for sensitivity  Assessment & Plan:  Principal Problem:   Obstruction of right ureteropelvic junction (UPJ) due to stone Active Problems:   History of TIA (transient ischemic attack)   Unspecified atrial fibrillation (HCC)   Cognitive deficits   Pancreatic mass   Acute renal failure superimposed on stage 3a chronic kidney disease (HCC)   Acute UTI   Right obstructing UPJ stone/UTI: Presented with nausea, vomiting, fatigue, dysuria.  UA was suspicious for UTI.  Imaging showed 9 mm right UPJ stone with moderate hydronephrosis.  Patient was started on antibiotics, IV fluids per urology consulted.  Status post cystoscopy, right-sided ureteral stent placement on 11/2.  Cultures pending.  Continue current antibiotics.  Currently on ceftriaxone.  Currently she is afebrile, hemodynamically stable.  She needs to follow-up with urology as an outpatient.  Foley removed  Gram-negative bacteremia: Blood cultures showing Proteus.  Sensitivity pending.  Continue ceftriaxone for now.  Plan for 7 to 10 days of total course of treatment  Paroxysmal atrial fibrillation: Currently in normal sinus rhythm.  Not on anticoagulation due to history of falls.  On Coreg for rate control, currently on hold due to  low blood pressure and bradycardia  AKI on CKD stage II: Baseline creatinine of around 1.2-1.4 presented with creatinine of 1.6.  Kidney function back to baseline now.  Thrombocytopenia: Mild, likely associated with infection.  Continue to monitor  Cognitive deficits/TIA: Continue delirium precaution, frequent reorientation.  Takes Lipitor and aspirin for TIA  Pancreatic cyst: CT showed slowly enlarging bilobed cyst in the pancreatic head, currently 2.7 x 1.5 cm.  We recommend continuous monitoring as an outpatient  Advanced age/debility/deconditioning: Will consult PT/OT.  Patient is from [place].  Has per daughter, she is mostly nonambulatory.  TOC consulted        DVT prophylaxis:SCDs Start: 03/19/23 0012     Code Status: Full Code  Family Communication: Discussed with daughter at bedside on 11/2  Patient status: Inpatient  Patient is from : SNF  Anticipated discharge to:SNF  Estimated DC date:likely tomorrow   Consultants: Urology  Procedures: Urine stent placement  Antimicrobials:  Anti-infectives (From admission, onward)    Start     Dose/Rate Route Frequency Ordered Stop   03/19/23 2200  cefTRIAXone (ROCEPHIN) 1 g in sodium chloride 0.9 % 100 mL IVPB  Status:  Discontinued        1 g 200 mL/hr over 30 Minutes Intravenous Every 24 hours 03/19/23 0013 03/19/23 1409   03/19/23 2200  cefTRIAXone (ROCEPHIN) 2 g in sodium chloride 0.9 % 100 mL IVPB        2 g 200 mL/hr over 30 Minutes Intravenous Every 24 hours 03/19/23 1409     03/18/23 2345  cefTRIAXone (ROCEPHIN) 1 g in sodium chloride 0.9 % 100 mL IVPB        1 g 200 mL/hr over 30  Minutes Intravenous  Once 03/18/23 2344 03/19/23 0107       Subjective: Patient seen and examined at bedside today.  Hemodynamically stable.  Afebrile.  Comfortable today.  Foley removed this morning.  Daughter at bedside.  No new complaints  Objective: Vitals:   03/19/23 1538 03/19/23 1951 03/20/23 0120 03/20/23 0338  BP:  111/62 114/70  (!) 123/56  Pulse: (!) 55 (!) 53  (!) 51  Resp: 14 14  18   Temp: 98.6 F (37 C) 98.5 F (36.9 C)  98 F (36.7 C)  TempSrc:  Oral  Oral  SpO2: 99% 99%  100%  Weight:   56.5 kg   Height:        Intake/Output Summary (Last 24 hours) at 03/20/2023 1027 Last data filed at 03/20/2023 0406 Gross per 24 hour  Intake 720 ml  Output 450 ml  Net 270 ml   Filed Weights   03/19/23 0624 03/20/23 0120  Weight: 56.4 kg 56.5 kg    Examination:  General exam: Overall comfortable, not in distress, pleasantly confused HEENT: PERRL Respiratory system:  no wheezes or crackles  Cardiovascular system: S1 & S2 heard, RRR.  Gastrointestinal system: Abdomen is nondistended, soft and nontender. Central nervous system: Alert and oriented Extremities: No edema, no clubbing ,no cyanosis Skin: No rashes, no ulcers,no icterus     Data Reviewed: I have personally reviewed following labs and imaging studies  CBC: Recent Labs  Lab 03/18/23 2100 03/19/23 0808 03/20/23 0414  WBC 12.5* 9.9 11.1*  HGB 11.2* 10.3* 10.8*  HCT 37.5 30.5* 32.1*  MCV 103.9* 93.8 95.3  PLT 142* 131* 132*   Basic Metabolic Panel: Recent Labs  Lab 03/18/23 2100 03/19/23 0808 03/20/23 0414  NA 136 134* 134*  K 4.9 3.7 4.4  CL 103 104 103  CO2 22 24 20*  GLUCOSE 180* 136* 132*  BUN 29* 26* 26*  CREATININE 1.63* 1.29* 1.16*  CALCIUM 9.3 8.8* 9.0     Recent Results (from the past 240 hour(s))  Blood culture (routine x 2)     Status: None (Preliminary result)   Collection Time: 03/18/23 11:45 PM   Specimen: BLOOD  Result Value Ref Range Status   Specimen Description BLOOD SITE NOT SPECIFIED  Final   Special Requests   Final    BOTTLES DRAWN AEROBIC AND ANAEROBIC Blood Culture adequate volume   Culture  Setup Time   Final    GRAM NEGATIVE RODS IN BOTH AEROBIC AND ANAEROBIC BOTTLES CRITICAL VALUE NOTED.  VALUE IS CONSISTENT WITH PREVIOUSLY REPORTED AND CALLED VALUE. Performed at [facility], [address], [place], [place] [address]    Culture GRAM NEGATIVE RODS  Final   Report Status PENDING  Incomplete  Blood culture (routine x 2)     Status: None (Preliminary result)   Collection Time: 03/18/23 11:50 PM   Specimen: BLOOD  Result Value Ref Range Status   Specimen Description BLOOD SITE NOT SPECIFIED  Final   Special Requests   Final    BOTTLES DRAWN AEROBIC AND ANAEROBIC Blood Culture adequate volume   Culture  Setup Time   Final    GRAM NEGATIVE RODS IN BOTH AEROBIC AND ANAEROBIC BOTTLES CRITICAL RESULT CALLED TO, READ BACK BY AND VERIFIED WITH: Ebony Cargo [id number] AT 1403 BY EC Performed at [facility], [address] [place], [place] [address]    Culture GRAM NEGATIVE RODS  Final   Report Status PENDING  Incomplete  Blood Culture  ID Panel (Reflexed)     Status: Abnormal   Collection Time: 03/18/23 11:50 PM  Result Value Ref Range Status   Enterococcus faecalis NOT DETECTED NOT DETECTED Final   Enterococcus Faecium NOT DETECTED NOT DETECTED Final   Listeria monocytogenes NOT DETECTED NOT DETECTED Final   Staphylococcus species NOT DETECTED NOT DETECTED Final   Staphylococcus aureus (BCID) NOT DETECTED NOT DETECTED Final   Staphylococcus epidermidis NOT DETECTED NOT DETECTED Final   Staphylococcus lugdunensis NOT DETECTED NOT DETECTED Final   Streptococcus species NOT DETECTED NOT DETECTED Final   Streptococcus agalactiae NOT DETECTED NOT DETECTED Final   Streptococcus pneumoniae NOT DETECTED NOT DETECTED Final   Streptococcus pyogenes NOT DETECTED NOT DETECTED Final   A.calcoaceticus-baumannii NOT DETECTED NOT DETECTED Final   Bacteroides fragilis NOT DETECTED NOT DETECTED Final   Enterobacterales DETECTED (A) NOT DETECTED Final    Comment: Enterobacterales represent a large order of gram negative bacteria, not a single organism. CRITICAL RESULT CALLED TO, READ BACK BY AND VERIFIED WITH: PHARMD JENNY ZHOU [phone] AT 1403 BY EC     Enterobacter cloacae complex NOT DETECTED NOT DETECTED Final   Escherichia coli NOT DETECTED NOT DETECTED Final   Klebsiella aerogenes NOT DETECTED NOT DETECTED Final   Klebsiella oxytoca NOT DETECTED NOT DETECTED Final   Klebsiella pneumoniae NOT DETECTED NOT DETECTED Final   Proteus species DETECTED (A) NOT DETECTED Final    Comment: CRITICAL RESULT CALLED TO, READ BACK BY AND VERIFIED WITH: PHARMD JENNY ZHOU [phone] AT 1403 BY EC    Salmonella species NOT DETECTED NOT DETECTED Final   Serratia marcescens NOT DETECTED NOT DETECTED Final   Haemophilus influenzae NOT DETECTED NOT DETECTED Final   Neisseria meningitidis NOT DETECTED NOT DETECTED Final   Pseudomonas aeruginosa NOT DETECTED NOT DETECTED Final   Stenotrophomonas maltophilia NOT DETECTED NOT DETECTED Final   Candida albicans NOT DETECTED NOT DETECTED Final   Candida auris NOT DETECTED NOT DETECTED Final   Candida glabrata NOT DETECTED NOT DETECTED Final   Candida krusei NOT DETECTED NOT DETECTED Final   Candida parapsilosis NOT DETECTED NOT DETECTED Final   Candida tropicalis NOT DETECTED NOT DETECTED Final   Cryptococcus neoformans/gattii NOT DETECTED NOT DETECTED Final   CTX-M ESBL NOT DETECTED NOT DETECTED Final   Carbapenem resistance IMP NOT DETECTED NOT DETECTED Final   Carbapenem resistance KPC NOT DETECTED NOT DETECTED Final   Carbapenem resistance NDM NOT DETECTED NOT DETECTED Final   Carbapenem resist OXA 48 LIKE NOT DETECTED NOT DETECTED Final   Carbapenem resistance VIM NOT DETECTED NOT DETECTED Final    Comment: Performed at [facility], [address] [place], [place] [address]  Surgical PCR screen     Status: Abnormal   Collection Time: 03/19/23  2:54 AM   Specimen: Nasal Mucosa; Nasal Swab  Result Value Ref Range Status   MRSA, PCR POSITIVE (A) NEGATIVE Final    Comment: RESULT CALLED TO, READ BACK BY AND VERIFIED WITH: S. VELDEZ RN 03/19/23 @ 0616 BY AB    Staphylococcus aureus POSITIVE (A)  NEGATIVE Final    Comment: (NOTE) The Xpert SA Assay (FDA approved for NASAL specimens in patients 76 years of age and older), is one component of a comprehensive surveillance program. It is not intended to diagnose infection nor to guide or monitor treatment. Performed at [facility], [address], [place], [place] [address]      Radiology Studies: DG C-Arm 1-60 Min-No Report  Result Date: 03/19/2023 Fluoroscopy  was utilized by the requesting physician.  No radiographic interpretation.   CT Head Wo Contrast  Result Date: 03/18/2023 CLINICAL DATA:  Altered mental status, vomiting EXAM: CT HEAD WITHOUT CONTRAST TECHNIQUE: Contiguous axial images were obtained from the base of the skull through the vertex without intravenous contrast. RADIATION DOSE REDUCTION: This exam was performed according to the departmental dose-optimization program which includes automated exposure control, adjustment of the mA and/or kV according to patient size and/or use of iterative reconstruction technique. COMPARISON:  05/14/2021 FINDINGS: Brain: There is atrophy and chronic small vessel disease changes. No acute intracranial abnormality. Specifically, no hemorrhage, hydrocephalus, mass lesion, acute infarction, or significant intracranial injury. Vascular: No hyperdense vessel or unexpected calcification. Skull: No acute calvarial abnormality. Sinuses/Orbits: No acute findings Other: None IMPRESSION: Atrophy, chronic microvascular disease. No acute intracranial abnormality. Electronically Signed   By: Charlett Nose M.D.   On: 03/18/2023 23:22   CT ABDOMEN PELVIS W CONTRAST  Result Date: 03/18/2023 CLINICAL DATA:  Nausea, vomiting, abdominal pain EXAM: CT ABDOMEN AND PELVIS WITH CONTRAST TECHNIQUE: Multidetector CT imaging of the abdomen and pelvis was performed using the standard protocol following bolus administration of intravenous contrast. RADIATION DOSE REDUCTION: This exam was performed according to  the departmental dose-optimization program which includes automated exposure control, adjustment of the mA and/or kV according to patient size and/or use of iterative reconstruction technique. CONTRAST:  60mL OMNIPAQUE IOHEXOL 350 MG/ML SOLN COMPARISON:  05/14/2021 FINDINGS: Lower chest: Small right pleural effusion. Dependent atelectasis bilaterally. Heart borderline in size. Coronary artery and aortic atherosclerosis. Hepatobiliary: No focal hepatic abnormality. Gallbladder unremarkable. Pancreas: Bilobed cystic area again noted in the pancreatic head measuring 2.7 x 1.5 cm compared to 2.0 x 1.0 cm previously. No pancreatic ductal dilatation or surrounding inflammation. Spleen: No focal abnormality.  Normal size. Adrenals/Urinary Tract: Adrenal glands normal. Large right mid to upper pole cyst measuring up to 8 cm compared to 8 cm previously. 9 mm stone at the right UPJ with moderate right hydronephrosis. Multiple small layering stones in the dilated renal pelvis. No stones or hydronephrosis on the left. Urinary bladder decompressed. Stomach/Bowel: Distention of the stomach with gas and fluid. Small bowel decompressed. Large bowel grossly unremarkable. Vascular/Lymphatic: Aortoiliac atherosclerosis. No evidence of aneurysm or adenopathy. Reproductive: Prior hysterectomy.  No adnexal masses. Other: No free fluid or free air. Musculoskeletal: No acute bony abnormality. IMPRESSION: Small right pleural effusion. 9 mm right UPJ stone with moderate right hydronephrosis. Multiple layering stones in the right renal pelvis. Slowly enlarging bilobed cyst in the pancreatic head, currently 2.7 x 1.5 cm. Aortoiliac atherosclerosis. Electronically Signed   By: Charlett Nose M.D.   On: 03/18/2023 23:21    Scheduled Meds:  atorvastatin  20 mg Oral Daily   brimonidine  1 drop Both Eyes BID   And   timolol  1 drop Both Eyes BID   Chlorhexidine Gluconate Cloth  6 each Topical Q0600   latanoprost  1 drop Both Eyes QHS    mupirocin ointment  1 Application Nasal BID   oxybutynin  10 mg Oral QHS   sodium chloride flush  3 mL Intravenous Q12H   Continuous Infusions:  cefTRIAXone (ROCEPHIN)  IV 2 g (03/19/23 2018)     LOS: 1 day   Burnadette Pop, MD [facility] P11/07/2022, 10:27 AM

## 2023-03-20 NOTE — Evaluation (Signed)
Physical Therapy Evaluation Patient Details Name: Holly Nunez MRN: [id number] DOB: 01/16/1925 Today's Date: 03/20/2023  History of Present Illness  Pt is a 89 y/o F presenting to ED on 11/1 from [facility] with AMS, R abdominal pain, and dysuria. Imaging reveals 9mm R UPJ stone with R hydronephrosis, s/p R uretereal stent placementon 11/2. PMH includes arthritis, hyperglycemia, HTN, HLD, TIA   Clinical Impression  Pt in bed upon arrival with daughter present and agreeable to PT eval. Pt is unreliable historian with daughter assisting to answer questions regarding previous mobility status. Prior to admit, pt needed assist with all mobility. Pt would be transferred by staff with an anterior approach to stand and pivot to a WC. Pt has a fear of falling and may be self-limiting mobility. Pt was unable to clear bottom with totalAx1 via anterior approach. Pt will need x2 assist for OOB mobility at this time. Pt presents to therapy session with decreased strength, balance, and mobility. Pt would benefit from acute skilled PT to address functional impairments. Recommending return to [facility] upon d/c. Acute PT to follow.          If plan is discharge home, recommend the following: A lot of help with walking and/or transfers;A lot of help with bathing/dressing/bathroom;Direct supervision/assist for medications management;Direct supervision/assist for financial management;Assist for transportation;Help with stairs or ramp for entrance   Can travel by private vehicle   No    Equipment Recommendations None recommended by PT (TBD at next level of care)  Recommendations for Other Services       Functional Status Assessment Patient has had a recent decline in their functional status and demonstrates the ability to make significant improvements in function in a reasonable and predictable amount of time.     Precautions / Restrictions Precautions Precautions: Fall Restrictions Weight  Bearing Restrictions: No      Mobility  Bed Mobility Overal bed mobility: Needs Assistance Bed Mobility: Sidelying to Sit, Rolling, Sit to Supine Rolling: Max assist Sidelying to sit: Max assist, HOB elevated   Sit to supine: Total assist   General bed mobility comments: Pt able to move LE towards EOB, requires tactile cues to slide LE and not lift in the air. MaxA for trunk elevation and to swing LE off EOB. TotalA for sit/supine    Transfers Overall transfer level: Needs assistance Equipment used: None Transfers: Sit to/from Stand Sit to Stand: Total assist        General transfer comment: two attempts to stand with anterior approach and pt attempting to push with UE. Pt unable to clear bottom at this time. Suspect pt was pushing self back towards the bed. Will need x2 for OOB mobility    Ambulation/Gait  General Gait Details: pt unable to take steps at this time        Balance Overall balance assessment: Needs assistance Sitting-balance support: Feet supported, Single extremity supported Sitting balance-Leahy Scale: Fair Sitting balance - Comments: pt prefers to hold onto something due to fear of falling     Standing balance-Leahy Scale: Zero Standing balance comment: unable to stand w/o TotalA        Pertinent Vitals/Pain Pain Assessment Pain Assessment: No/denies pain    Home Living Family/patient expects to be discharged to:: Skilled nursing facility      Additional Comments: has been at [facility] for 1 year    Prior Function Prior Level of Function : Needs assist       Physical Assist :  Mobility (physical);ADLs (physical) Mobility (physical): Bed mobility;Transfers;Gait ADLs (physical): Bathing;Dressing;Toileting;IADLs Mobility Comments: reports staff assists with anterior approach to stand and pivot to WC. Has not taken steps for "a while" ADLs Comments: does self feeding and groomign tasks, assist for other ADLs     Extremity/Trunk  Assessment   Upper Extremity Assessment Upper Extremity Assessment: Defer to OT evaluation    Lower Extremity Assessment Lower Extremity Assessment: Generalized weakness    Cervical / Trunk Assessment Cervical / Trunk Assessment: Normal  Communication   Communication Communication: No apparent difficulties Cueing Techniques: Verbal cues;Tactile cues  Cognition Arousal: Alert Behavior During Therapy: WFL for tasks assessed/performed, Anxious Overall Cognitive Status: Impaired/Different from baseline Area of Impairment: Memory, Following commands, Orientation    Orientation Level: Situation, Time (alert to year)   Memory: Decreased short-term memory Following Commands: Follows one step commands with increased time, Follows one step commands inconsistently       General Comments: decr STM, states she is at the hospital for a fall. Has difficulty following directions, tacile and verbal cues for sequencing        General Comments General comments (skin integrity, edema, etc.): VSS on RA. Pt fearful of falling and reports being cold     PT Assessment Patient needs continued PT services  PT Problem List Decreased strength;Decreased activity tolerance;Decreased balance;Decreased mobility;Decreased safety awareness       PT Treatment Interventions DME instruction;Gait training;Functional mobility training;Therapeutic activities;Therapeutic exercise;Balance training;Neuromuscular re-education;Patient/family education;Wheelchair mobility training    PT Goals (Current goals can be found in the Care Plan section)  Acute Rehab PT Goals Patient Stated Goal: to get better PT Goal Formulation: With patient/family Time For Goal Achievement: 04/03/23 Potential to Achieve Goals: Fair    Frequency Min 1X/week        AM-PAC PT "6 Clicks" Mobility  Outcome Measure Help needed turning from your back to your side while in a flat bed without using bedrails?: A Lot Help needed moving  from lying on your back to sitting on the side of a flat bed without using bedrails?: A Lot Help needed moving to and from a bed to a chair (including a wheelchair)?: Total Help needed standing up from a chair using your arms (e.g., wheelchair or bedside chair)?: Total Help needed to walk in hospital room?: Total Help needed climbing 3-5 steps with a railing? : Total 6 Click Score: 8    End of Session Equipment Utilized During Treatment: Gait belt Activity Tolerance: Other (comment) (limited by fear of faling and wanting to return to bed) Patient left: in bed;with call bell/phone within reach;with bed alarm set;with family/visitor present Nurse Communication: Mobility status PT Visit Diagnosis: Unsteadiness on feet (R26.81);Muscle weakness (generalized) (M62.81)    Time: 6045-4098 PT Time Calculation (min) (ACUTE ONLY): 22 min   Charges:   PT Evaluation $PT Eval Low Complexity: 1 Low   PT General Charges $$ ACUTE PT VISIT: 1 Visit         Hilton Cork, PT, DPT Secure Chat Preferred  Rehab Office [phone]   Arturo Morton Brion Aliment 03/20/2023, 11:18 AM

## 2023-03-21 ENCOUNTER — Telehealth: Payer: Self-pay | Admitting: Urology

## 2023-03-21 DIAGNOSIS — N201 Calculus of ureter: Secondary | ICD-10-CM | POA: Diagnosis not present

## 2023-03-21 LAB — CULTURE, BLOOD (ROUTINE X 2)
Special Requests: ADEQUATE
Special Requests: ADEQUATE

## 2023-03-21 MED ORDER — AMOXICILLIN-POT CLAVULANATE 500-125 MG PO TABS
1.0000 | ORAL_TABLET | Freq: Two times a day (BID) | ORAL | Status: DC
  Administered 2023-03-21 – 2023-03-22 (×2): 1 via ORAL
  Filled 2023-03-21 (×2): qty 1

## 2023-03-21 MED ORDER — AMOXICILLIN-POT CLAVULANATE 500-125 MG PO TABS: 1.0000 | ORAL_TABLET | Freq: Two times a day (BID) | ORAL | Status: AC

## 2023-03-21 NOTE — Plan of Care (Signed)
  Problem: Education: Goal: Knowledge of General Education information will improve Description: Including pain rating scale, medication(s)/side effects and non-pharmacologic comfort measures Outcome: Progressing   Problem: Nutrition: Goal: Adequate nutrition will be maintained Outcome: Progressing   Problem: Pain Management: Goal: General experience of comfort will improve Outcome: Progressing   Problem: Safety: Goal: Ability to remain free from injury will improve Outcome: Progressing   Problem: Skin Integrity: Goal: Risk for impaired skin integrity will decrease Outcome: Progressing

## 2023-03-21 NOTE — TOC Initial Note (Signed)
Transition of Care [facility] - Initial/Assessment Note    Patient Details  Name: Holly Nunez MRN: [id number] Date of Birth: 04/23/1925  Transition of Care [facility]) CM/SW Contact:    Carley Hammed, LCSW Phone Number: 03/21/2023, 11:31 AM  Clinical Narrative:                 CSW met with pt and daughter at the bedside, other dtr on Speaker phone. Pt and family agreeable to pt returning to [employer] at DC. Pt with Medicare AB, will need 3 midnight stay, DC pushed until tomorrow. Family agreeable to [facility] transport. Facility notified and also agreeable. TOC will continue to follow.   Expected Discharge Plan: Skilled Nursing Facility Barriers to Discharge: Continued Medical Work up, English as a second language teacher   Patient Goals and CMS Choice Patient states their goals for this hospitalization and ongoing recovery are:: Pt and family want to return to SNF. CMS Medicare.gov Compare Post Acute Care list provided to:: Patient Choice offered to / list presented to : Adult Children, Patient      Expected Discharge Plan and Services     Post Acute Care Choice: Skilled Nursing Facility Living arrangements for the past 2 months: Skilled Nursing Facility Expected Discharge Date: 03/22/23                                    Prior Living Arrangements/Services Living arrangements for the past 2 months: Skilled Nursing Facility Lives with:: Facility Resident Patient language and need for interpreter reviewed:: Yes Do you feel safe going back to the place where you live?: Yes      Need for Family Participation in Patient Care: Yes (Comment) Care giver support system in place?: Yes (comment)   Criminal Activity/Legal Involvement Pertinent to Current Situation/Hospitalization: No - Comment as needed  Activities of Daily Living   ADL Screening (condition at time of admission) Independently performs ADLs?: No Does the patient have a NEW difficulty with bathing/dressing/toileting/self-feeding  that is expected to last >3 days?: No Does the patient have a NEW difficulty with getting in/out of bed, walking, or climbing stairs that is expected to last >3 days?: No Does the patient have a NEW difficulty with communication that is expected to last >3 days?: No Is the patient deaf or have difficulty hearing?: Yes Does the patient have difficulty seeing, even when wearing glasses/contacts?: Yes Does the patient have difficulty concentrating, remembering, or making decisions?: Yes  Permission Sought/Granted Permission sought to share information with : Family Supports Permission granted to share information with : Yes, Verbal Permission Granted  Share Information with NAME: [name]  Permission granted to share info w AGENCY: IT consultant granted to share info w Relationship: Daughter     Emotional Assessment Appearance:: Appears stated age Attitude/Demeanor/Rapport: Engaged Affect (typically observed): Appropriate Orientation: : Oriented to Self Alcohol / Substance Use: Not Applicable Psych Involvement: No (comment)  Admission diagnosis:  Obstruction of right ureteropelvic junction (UPJ) due to stone [N20.1] Patient Active Problem List   Diagnosis Date Noted   Obstruction of right ureteropelvic junction (UPJ) due to stone 03/19/2023   Acute renal failure superimposed on stage 3a chronic kidney disease (HCC) 03/19/2023   Acute UTI 03/19/2023   Pyuria 05/18/2021   Cognitive deficits 05/18/2021   Pancreatic mass 05/18/2021   Abnormal ECG 05/15/2021   Fall 05/14/2021   Psoas hematoma, left, secondary to anticoagulant therapy 05/14/2021   CKD (chronic  kidney disease), stage III (HCC) 05/14/2021   Unspecified atrial fibrillation (HCC) 05/14/2021   Pancreatic pseudocyst/cyst 05/14/2021   History of TIA (transient ischemic attack) 04/16/2020   Anticoagulation adequate 04/16/2020   Dyspepsia 08/21/2019   Trauma 09/23/2017   Debility 06/21/2017   Great toe pain, left  12/02/2016   Vitamin D deficiency 12/02/2016   Hyperglycemia 05/27/2016   Estrogen deficiency 05/27/2016   Overactive bladder 12/07/2015   Allergy 12/07/2015   Multinodular goiter (nontoxic) 04/25/2015   Preventative health care 02/26/2015   History of chicken pox    Renal insufficiency 03/07/2014   HTN (hypertension) 02/19/2014   Hyperlipidemia 02/19/2014   DJD (degenerative joint disease) 02/19/2014   S/P hysterectomy 02/19/2014   Anemia 02/19/2014   Urinary incontinence 02/19/2014   PCP:  Pcp, No Pharmacy:   [id number] - [place], [place] - [address] [place] [address] Phone: [phone] Fax: [phone]  [id number] - [place], [place] - [address] AT [facility] [address] [place] [address] Phone: [phone] Fax: [phone]  [facility] - [place], [place] - [address] [place] [address] Phone: [phone] Fax: [phone]     Social Determinants of Health (SDOH) Social History: SDOH Screenings   Food Insecurity: No Food Insecurity (03/19/2023)  Housing: Patient Declined (03/19/2023)  Transportation Needs: No Transportation Needs (03/19/2023)  Utilities: Not At Risk (03/19/2023)  Alcohol Screen: Low Risk  (01/29/2021)  Depression (PHQ2-9): Low Risk  (01/29/2021)  Financial Resource Strain: Low Risk  (01/29/2021)  Physical Activity: Inactive (01/29/2021)  Social Connections: Socially Isolated (01/29/2021)  Stress: No Stress Concern Present (01/29/2021)  Tobacco Use: Low Risk  (03/19/2023)   SDOH Interventions:     Readmission Risk Interventions     No data to display

## 2023-03-21 NOTE — Discharge Summary (Signed)
Physician Discharge Summary  Holly Nunez ZOX:[id number] DOB: 1924/12/13 DOA: 03/18/2023  PCP: Pcp, No  Admit date: 03/18/2023 Discharge date: 03/21/2023  Admitted From: Home Disposition:  Home  Discharge Condition:Stable CODE STATUS:FULL Diet recommendation: Regular  Brief/Interim Summary: Patient is a 89 year old female with history of hypertension, TIA, coronary disease, atrial fibrillation not on anticoagulation, CKD stage III who presented with fatigue, dysuria, right-sided abdomen pain, nausea/vomiting from at [facility].  Lab work on presentation showed creatinine of 1.6, WBC count of 12.5.  UA was suspicious for UTI.  Imaging showed 9 mm right UPJ stone with moderate hydronephrosis.  Patient was started on antibiotics, IV fluids per urology consulted.  Status post cystoscopy, right-sided ureteral stent placement on 11/2.  Sepsis physiology has resolved.  Blood cultures showing Proteus, antibiotics changed to oral.  Medically stable for discharge back to SNF whenever possible.  Following problems were addressed during the hospitalization:  Right obstructing UPJ stone/UTI: Presented with nausea, vomiting, fatigue, dysuria.  UA was suspicious for UTI.  Imaging showed 9 mm right UPJ stone with moderate hydronephrosis.  Patient was started on antibiotics, IV fluids,Urology consulted.  Status post cystoscopy, right-sided ureteral stent placement on 11/2. Sepsis physiology has resolved.  Blood cultures showing Proteus, antibiotics changed to oral. Currently she is afebrile, hemodynamically stable.  She needs to follow-up with urology as an outpatient.  Foley removed   Gram-negative bacteremia: Blood cultures showed Proteus.  Abx Augmentin  Paroxysmal atrial fibrillation: Currently in normal sinus rhythm.  Not on anticoagulation due to history of falls.  On Coreg for rate control, currently on hold due to low blood pressure and bradycardia.  Continue holding for now.  Follow-up with cardiology as an  outpatient.   AKI on CKD stage II: Baseline creatinine of around 1.2-1.4 presented with creatinine of 1.6.  Kidney function back to baseline now.   Thrombocytopenia: Mild, likely associated with infection.  Continue to monitor as an outpatient   Cognitive deficits/TIA: Continue delirium precaution, frequent reorientation.  Takes Lipitor and aspirin for TIA   Pancreatic cyst: CT showed slowly enlarging bilobed cyst in the pancreatic head, currently 2.7 x 1.5 cm.  We recommend continuous monitoring as an outpatient   Advanced age/debility/deconditioning: Consulted PT/OT.  Patient is from [place].  As per daughter, she is mostly nonambulatory.  TOC consulted.  Plan is to send her back to her SNF    Discharge Diagnoses:  Principal Problem:   Obstruction of right ureteropelvic junction (UPJ) due to stone Active Problems:   History of TIA (transient ischemic attack)   Unspecified atrial fibrillation (HCC)   Cognitive deficits   Pancreatic mass   Acute renal failure superimposed on stage 3a chronic kidney disease (HCC)   Acute UTI    Discharge Instructions  Discharge Instructions     Diet general   Complete by: As directed    Discharge instructions   Complete by: As directed    1)Please take your medications as instructed 2)Do a CBC and BMP tests in a week   Increase activity slowly   Complete by: As directed       Allergies as of 03/21/2023       Reactions   Alphagan [brimonidine] Other (See Comments)   Secondary infection and vision changes        Medication List     STOP taking these medications    carvedilol 3.125 MG tablet Commonly known as: COREG   GAS-X PO       TAKE these medications  acetaminophen 650 MG CR tablet Commonly known as: TYLENOL Take 2 tablets (1,300 mg total) by mouth every 6 (six) hours as needed for pain (for mild pain).   amoxicillin-clavulanate 500-125 MG tablet Commonly known as: AUGMENTIN Take 1 tablet by mouth 2 (two)  times daily for 9 doses.   ASPERCREME EX Apply 1 application  topically in the morning and at bedtime.   aspirin EC 81 MG tablet Take 81 mg by mouth daily. Swallow whole.   atorvastatin 20 MG tablet Commonly known as: LIPITOR Take 20 mg by mouth daily.   Biofreeze Cool The Pain 4 % Gel Generic drug: Menthol (Topical Analgesic) Apply 1 Application topically in the morning, at noon, and at bedtime.   Combigan 0.2-0.5 % ophthalmic solution Generic drug: brimonidine-timolol Place 1 drop into both eyes every 12 (twelve) hours.   Dextran 70-Hypromellose 0.1-0.3 % Soln Apply 2 drops to eye every 12 (twelve) hours as needed (dry, itchy, irritated eyes).   dextromethorphan-guaiFENesin 10-100 MG/5ML liquid Commonly known as: ROBITUSSIN-DM Take 10 mLs by mouth every 6 (six) hours as needed for cough.   FISH OIL PO Take 1 capsule by mouth daily.   folic acid 1 MG tablet Commonly known as: FOLVITE Take 1 mg by mouth daily.   latanoprost 0.005 % ophthalmic solution Commonly known as: XALATAN Place 1 drop into both eyes at bedtime.   multivitamin tablet Take 1 tablet by mouth daily.   nitroGLYCERIN 0.4 MG SL tablet Commonly known as: NITROSTAT Place 1 tablet (0.4 mg total) under the tongue every 5 (five) minutes as needed for chest pain.   oxybutynin 10 MG 24 hr tablet Commonly known as: DITROPAN-XL TAKE 1 TABLET(10 MG) BY MOUTH AT BEDTIME What changed: See the new instructions.   polyethylene glycol 17 g packet Commonly known as: MIRALAX / GLYCOLAX Take 17 g by mouth daily.   PROBIOTIC PO Take 1 tablet by mouth daily.        Follow-up Information     Stoneking, Danford Bad., MD. Schedule an appointment as soon as possible for a visit in 2 week(s).   Specialty: Urology Contact information: [address] [place] [address] [phone]                Allergies  Allergen Reactions   Alphagan [Brimonidine] Other (See Comments)     Secondary infection and vision changes    Consultations: Urology   Procedures/Studies: DG C-Arm 1-60 Min-No Report  Result Date: 03/19/2023 Fluoroscopy was utilized by the requesting physician.  No radiographic interpretation.   CT Head Wo Contrast  Result Date: 03/18/2023 CLINICAL DATA:  Altered mental status, vomiting EXAM: CT HEAD WITHOUT CONTRAST TECHNIQUE: Contiguous axial images were obtained from the base of the skull through the vertex without intravenous contrast. RADIATION DOSE REDUCTION: This exam was performed according to the departmental dose-optimization program which includes automated exposure control, adjustment of the mA and/or kV according to patient size and/or use of iterative reconstruction technique. COMPARISON:  05/14/2021 FINDINGS: Brain: There is atrophy and chronic small vessel disease changes. No acute intracranial abnormality. Specifically, no hemorrhage, hydrocephalus, mass lesion, acute infarction, or significant intracranial injury. Vascular: No hyperdense vessel or unexpected calcification. Skull: No acute calvarial abnormality. Sinuses/Orbits: No acute findings Other: None IMPRESSION: Atrophy, chronic microvascular disease. No acute intracranial abnormality. Electronically Signed   By: Charlett Nose M.D.   On: 03/18/2023 23:22   CT ABDOMEN PELVIS W CONTRAST  Result Date: 03/18/2023 CLINICAL DATA:  Nausea, vomiting,  abdominal pain EXAM: CT ABDOMEN AND PELVIS WITH CONTRAST TECHNIQUE: Multidetector CT imaging of the abdomen and pelvis was performed using the standard protocol following bolus administration of intravenous contrast. RADIATION DOSE REDUCTION: This exam was performed according to the departmental dose-optimization program which includes automated exposure control, adjustment of the mA and/or kV according to patient size and/or use of iterative reconstruction technique. CONTRAST:  60mL OMNIPAQUE IOHEXOL 350 MG/ML SOLN COMPARISON:  05/14/2021 FINDINGS:  Lower chest: Small right pleural effusion. Dependent atelectasis bilaterally. Heart borderline in size. Coronary artery and aortic atherosclerosis. Hepatobiliary: No focal hepatic abnormality. Gallbladder unremarkable. Pancreas: Bilobed cystic area again noted in the pancreatic head measuring 2.7 x 1.5 cm compared to 2.0 x 1.0 cm previously. No pancreatic ductal dilatation or surrounding inflammation. Spleen: No focal abnormality.  Normal size. Adrenals/Urinary Tract: Adrenal glands normal. Large right mid to upper pole cyst measuring up to 8 cm compared to 8 cm previously. 9 mm stone at the right UPJ with moderate right hydronephrosis. Multiple small layering stones in the dilated renal pelvis. No stones or hydronephrosis on the left. Urinary bladder decompressed. Stomach/Bowel: Distention of the stomach with gas and fluid. Small bowel decompressed. Large bowel grossly unremarkable. Vascular/Lymphatic: Aortoiliac atherosclerosis. No evidence of aneurysm or adenopathy. Reproductive: Prior hysterectomy.  No adnexal masses. Other: No free fluid or free air. Musculoskeletal: No acute bony abnormality. IMPRESSION: Small right pleural effusion. 9 mm right UPJ stone with moderate right hydronephrosis. Multiple layering stones in the right renal pelvis. Slowly enlarging bilobed cyst in the pancreatic head, currently 2.7 x 1.5 cm. Aortoiliac atherosclerosis. Electronically Signed   By: Charlett Nose M.D.   On: 03/18/2023 23:21      Subjective: Patient seen and examined at bedside today.  Hemodynamically stable ,comfortable.  Lying in bed.  No new complaints Long discussion had with the daughter at bedside about discharge planning  Discharge Exam: Vitals:   03/21/23 0349 03/21/23 0820  BP: 139/61 (!) 129/58  Pulse: (!) 50 (!) 56  Resp: 18 17  Temp: 98.3 F (36.8 C)   SpO2: 99% 100%   Vitals:   03/20/23 2003 03/21/23 0349 03/21/23 0500 03/21/23 0820  BP: (!) 153/73 139/61  (!) 129/58  Pulse: (!) 52 (!) 50   (!) 56  Resp: 18 18  17   Temp: 98.5 F (36.9 C) 98.3 F (36.8 C)    TempSrc: Oral Oral    SpO2: 100% 99%  100%  Weight:   52.1 kg   Height:        General: Pt is alert, awake, not in acute distress, pleasantly confused Cardiovascular: RRR, S1/S2 +, no rubs, no gallops Respiratory: CTA bilaterally, no wheezing, no rhonchi Abdominal: Soft, NT, ND, bowel sounds + Extremities: no edema, no cyanosis    The results of significant diagnostics from this hospitalization (including imaging, microbiology, ancillary and laboratory) are listed below for reference.     Microbiology: Recent Results (from the past 240 hour(s))  Blood culture (routine x 2)     Status: Abnormal   Collection Time: 03/18/23 11:45 PM   Specimen: BLOOD  Result Value Ref Range Status   Specimen Description BLOOD SITE NOT SPECIFIED  Final   Special Requests   Final    BOTTLES DRAWN AEROBIC AND ANAEROBIC Blood Culture adequate volume   Culture  Setup Time   Final    GRAM NEGATIVE RODS IN BOTH AEROBIC AND ANAEROBIC BOTTLES CRITICAL VALUE NOTED.  VALUE IS CONSISTENT WITH PREVIOUSLY REPORTED AND CALLED VALUE.  Culture (A)  Final    PROTEUS MIRABILIS SUSCEPTIBILITIES PERFORMED ON PREVIOUS CULTURE WITHIN THE LAST 5 DAYS. Performed at [facility], [address] [place], [place] [address]    Report Status 03/21/2023 FINAL  Final  Blood culture (routine x 2)     Status: Abnormal   Collection Time: 03/18/23 11:50 PM   Specimen: BLOOD  Result Value Ref Range Status   Specimen Description BLOOD SITE NOT SPECIFIED  Final   Special Requests   Final    BOTTLES DRAWN AEROBIC AND ANAEROBIC Blood Culture adequate volume   Culture  Setup Time   Final    GRAM NEGATIVE RODS IN BOTH AEROBIC AND ANAEROBIC BOTTLES CRITICAL RESULT CALLED TO, READ BACK BY AND VERIFIED WITH: Ebony Cargo [id number] AT 1403 BY EC Performed at [facility], [address] [place], [place] [address]    Culture PROTEUS MIRABILIS  (A)  Final   Report Status 03/21/2023 FINAL  Final   Organism ID, Bacteria PROTEUS MIRABILIS  Final   Organism ID, Bacteria PROTEUS MIRABILIS  Final      Susceptibility   Proteus mirabilis - MIC*    AMPICILLIN <=2 SENSITIVE Sensitive     CEFEPIME <=0.12 SENSITIVE Sensitive     CEFTAZIDIME <=1 SENSITIVE Sensitive     CEFTRIAXONE <=0.25 SENSITIVE Sensitive     CIPROFLOXACIN <=0.25 SENSITIVE Sensitive     GENTAMICIN <=1 SENSITIVE Sensitive     IMIPENEM 2 SENSITIVE Sensitive     TRIMETH/SULFA <=20 SENSITIVE Sensitive     AMPICILLIN/SULBACTAM <=2 SENSITIVE Sensitive     PIP/TAZO <=4 SENSITIVE Sensitive ug/mL   Proteus mirabilis - KIRBY BAUER*    CEFAZOLIN RESISTANT Resistant     * PROTEUS MIRABILIS    PROTEUS MIRABILIS  Blood Culture ID Panel (Reflexed)     Status: Abnormal   Collection Time: 03/18/23 11:50 PM  Result Value Ref Range Status   Enterococcus faecalis NOT DETECTED NOT DETECTED Final   Enterococcus Faecium NOT DETECTED NOT DETECTED Final   Listeria monocytogenes NOT DETECTED NOT DETECTED Final   Staphylococcus species NOT DETECTED NOT DETECTED Final   Staphylococcus aureus (BCID) NOT DETECTED NOT DETECTED Final   Staphylococcus epidermidis NOT DETECTED NOT DETECTED Final   Staphylococcus lugdunensis NOT DETECTED NOT DETECTED Final   Streptococcus species NOT DETECTED NOT DETECTED Final   Streptococcus agalactiae NOT DETECTED NOT DETECTED Final   Streptococcus pneumoniae NOT DETECTED NOT DETECTED Final   Streptococcus pyogenes NOT DETECTED NOT DETECTED Final   A.calcoaceticus-baumannii NOT DETECTED NOT DETECTED Final   Bacteroides fragilis NOT DETECTED NOT DETECTED Final   Enterobacterales DETECTED (A) NOT DETECTED Final    Comment: Enterobacterales represent a large order of gram negative bacteria, not a single organism. CRITICAL RESULT CALLED TO, READ BACK BY AND VERIFIED WITH: PHARMD JENNY ZHOU [phone] AT 1403 BY EC    Enterobacter cloacae complex NOT DETECTED NOT  DETECTED Final   Escherichia coli NOT DETECTED NOT DETECTED Final   Klebsiella aerogenes NOT DETECTED NOT DETECTED Final   Klebsiella oxytoca NOT DETECTED NOT DETECTED Final   Klebsiella pneumoniae NOT DETECTED NOT DETECTED Final   Proteus species DETECTED (A) NOT DETECTED Final    Comment: CRITICAL RESULT CALLED TO, READ BACK BY AND VERIFIED WITH: PHARMD JENNY ZHOU [phone] AT 1403 BY EC    Salmonella species NOT DETECTED NOT DETECTED Final   Serratia marcescens NOT DETECTED NOT DETECTED Final   Haemophilus influenzae NOT DETECTED NOT DETECTED Final   Neisseria meningitidis  NOT DETECTED NOT DETECTED Final   Pseudomonas aeruginosa NOT DETECTED NOT DETECTED Final   Stenotrophomonas maltophilia NOT DETECTED NOT DETECTED Final   Candida albicans NOT DETECTED NOT DETECTED Final   Candida auris NOT DETECTED NOT DETECTED Final   Candida glabrata NOT DETECTED NOT DETECTED Final   Candida krusei NOT DETECTED NOT DETECTED Final   Candida parapsilosis NOT DETECTED NOT DETECTED Final   Candida tropicalis NOT DETECTED NOT DETECTED Final   Cryptococcus neoformans/gattii NOT DETECTED NOT DETECTED Final   CTX-M ESBL NOT DETECTED NOT DETECTED Final   Carbapenem resistance IMP NOT DETECTED NOT DETECTED Final   Carbapenem resistance KPC NOT DETECTED NOT DETECTED Final   Carbapenem resistance NDM NOT DETECTED NOT DETECTED Final   Carbapenem resist OXA 48 LIKE NOT DETECTED NOT DETECTED Final   Carbapenem resistance VIM NOT DETECTED NOT DETECTED Final    Comment: Performed at [facility], [address] [place], [place] [address]  Urine Culture (for pregnant, neutropenic or urologic patients or patients with an indwelling urinary catheter)     Status: Abnormal (Preliminary result)   Collection Time: 03/19/23 12:12 AM   Specimen: Urine, Clean Catch  Result Value Ref Range Status   Specimen Description URINE, CLEAN CATCH  Final   Special Requests   Final    NONE Performed at [facility]  Lab, [address], [place], [place] [address]    Culture 50,000 COLONIES/mL PROTEUS MIRABILIS (A)  Final   Report Status PENDING  Incomplete   Organism ID, Bacteria PROTEUS MIRABILIS (A)  Final      Susceptibility   Proteus mirabilis - MIC*    AMPICILLIN <=2 SENSITIVE Sensitive     CEFAZOLIN 8 SENSITIVE Sensitive     CEFEPIME <=0.12 SENSITIVE Sensitive     CEFTRIAXONE <=0.25 SENSITIVE Sensitive     CIPROFLOXACIN <=0.25 SENSITIVE Sensitive     GENTAMICIN <=1 SENSITIVE Sensitive     IMIPENEM 4 SENSITIVE Sensitive     NITROFURANTOIN 128 RESISTANT Resistant     TRIMETH/SULFA <=20 SENSITIVE Sensitive     AMPICILLIN/SULBACTAM <=2 SENSITIVE Sensitive     PIP/TAZO <=4 SENSITIVE Sensitive ug/mL    * 50,000 COLONIES/mL PROTEUS MIRABILIS  Surgical PCR screen     Status: Abnormal   Collection Time: 03/19/23  2:54 AM   Specimen: Nasal Mucosa; Nasal Swab  Result Value Ref Range Status   MRSA, PCR POSITIVE (A) NEGATIVE Final    Comment: RESULT CALLED TO, READ BACK BY AND VERIFIED WITH: S. VELDEZ RN 03/19/23 @ 0616 BY AB    Staphylococcus aureus POSITIVE (A) NEGATIVE Final    Comment: (NOTE) The Xpert SA Assay (FDA approved for NASAL specimens in patients 16 years of age and older), is one component of a comprehensive surveillance program. It is not intended to diagnose infection nor to guide or monitor treatment. Performed at [facility], [address], [place], [place] [address]      Labs: BNP (last 3 results) No results for input(s): "BNP" in the last 8760 hours. Basic Metabolic Panel: Recent Labs  Lab 03/18/23 2100 03/19/23 0808 03/20/23 0414  NA 136 134* 134*  K 4.9 3.7 4.4  CL 103 104 103  CO2 22 24 20*  GLUCOSE 180* 136* 132*  BUN 29* 26* 26*  CREATININE 1.63* 1.29* 1.16*  CALCIUM 9.3 8.8* 9.0   Liver Function Tests: Recent Labs  Lab 03/18/23 2100  AST 33  ALT 16  ALKPHOS 72  BILITOT 1.4*  PROT  5.8*  ALBUMIN 2.4*   No results for input(s): "LIPASE",  "AMYLASE" in the last 168 hours. No results for input(s): "AMMONIA" in the last 168 hours. CBC: Recent Labs  Lab 03/18/23 2100 03/19/23 0808 03/20/23 0414  WBC 12.5* 9.9 11.1*  HGB 11.2* 10.3* 10.8*  HCT 37.5 30.5* 32.1*  MCV 103.9* 93.8 95.3  PLT 142* 131* 132*   Cardiac Enzymes: No results for input(s): "CKTOTAL", "CKMB", "CKMBINDEX", "TROPONINI" in the last 168 hours. BNP: Invalid input(s): "POCBNP" CBG: Recent Labs  Lab 03/18/23 2153  GLUCAP 154*   D-Dimer No results for input(s): "DDIMER" in the last 72 hours. Hgb A1c No results for input(s): "HGBA1C" in the last 72 hours. Lipid Profile No results for input(s): "CHOL", "HDL", "LDLCALC", "TRIG", "CHOLHDL", "LDLDIRECT" in the last 72 hours. Thyroid function studies No results for input(s): "TSH", "T4TOTAL", "T3FREE", "THYROIDAB" in the last 72 hours.  Invalid input(s): "FREET3" Anemia work up No results for input(s): "VITAMINB12", "FOLATE", "FERRITIN", "TIBC", "IRON", "RETICCTPCT" in the last 72 hours. Urinalysis    Component Value Date/Time   COLORURINE YELLOW 03/18/2023 2207   APPEARANCEUR CLEAR 03/18/2023 2207   LABSPEC 1.015 03/18/2023 2207   PHURINE 8.5 (H) 03/18/2023 2207   GLUCOSEU NEGATIVE 03/18/2023 2207   HGBUR LARGE (A) 03/18/2023 2207   BILIRUBINUR SMALL (A) 03/18/2023 2207   KETONESUR NEGATIVE 03/18/2023 2207   PROTEINUR >300 (A) 03/18/2023 2207   NITRITE POSITIVE (A) 03/18/2023 2207   LEUKOCYTESUR MODERATE (A) 03/18/2023 2207   Sepsis Labs Recent Labs  Lab 03/18/23 2100 03/19/23 0808 03/20/23 0414  WBC 12.5* 9.9 11.1*   Microbiology Recent Results (from the past 240 hour(s))  Blood culture (routine x 2)     Status: Abnormal   Collection Time: 03/18/23 11:45 PM   Specimen: BLOOD  Result Value Ref Range Status   Specimen Description BLOOD SITE NOT SPECIFIED  Final   Special Requests   Final    BOTTLES DRAWN AEROBIC AND ANAEROBIC Blood Culture adequate volume   Culture  Setup Time    Final    GRAM NEGATIVE RODS IN BOTH AEROBIC AND ANAEROBIC BOTTLES CRITICAL VALUE NOTED.  VALUE IS CONSISTENT WITH PREVIOUSLY REPORTED AND CALLED VALUE.    Culture (A)  Final    PROTEUS MIRABILIS SUSCEPTIBILITIES PERFORMED ON PREVIOUS CULTURE WITHIN THE LAST 5 DAYS. Performed at [facility], [address], [place], [place] [address]    Report Status 03/21/2023 FINAL  Final  Blood culture (routine x 2)     Status: Abnormal   Collection Time: 03/18/23 11:50 PM   Specimen: BLOOD  Result Value Ref Range Status   Specimen Description BLOOD SITE NOT SPECIFIED  Final   Special Requests   Final    BOTTLES DRAWN AEROBIC AND ANAEROBIC Blood Culture adequate volume   Culture  Setup Time   Final    GRAM NEGATIVE RODS IN BOTH AEROBIC AND ANAEROBIC BOTTLES CRITICAL RESULT CALLED TO, READ BACK BY AND VERIFIED WITH: Ebony Cargo [id number] AT 1403 BY EC Performed at [facility], [address] [place], [place] [address]    Culture PROTEUS MIRABILIS (A)  Final   Report Status 03/21/2023 FINAL  Final   Organism ID, Bacteria PROTEUS MIRABILIS  Final   Organism ID, Bacteria PROTEUS MIRABILIS  Final      Susceptibility   Proteus mirabilis - MIC*    AMPICILLIN <=2 SENSITIVE Sensitive     CEFEPIME <=0.12 SENSITIVE Sensitive     CEFTAZIDIME <=1 SENSITIVE Sensitive  CEFTRIAXONE <=0.25 SENSITIVE Sensitive     CIPROFLOXACIN <=0.25 SENSITIVE Sensitive     GENTAMICIN <=1 SENSITIVE Sensitive     IMIPENEM 2 SENSITIVE Sensitive     TRIMETH/SULFA <=20 SENSITIVE Sensitive     AMPICILLIN/SULBACTAM <=2 SENSITIVE Sensitive     PIP/TAZO <=4 SENSITIVE Sensitive ug/mL   Proteus mirabilis - KIRBY BAUER*    CEFAZOLIN RESISTANT Resistant     * PROTEUS MIRABILIS    PROTEUS MIRABILIS  Blood Culture ID Panel (Reflexed)     Status: Abnormal   Collection Time: 03/18/23 11:50 PM  Result Value Ref Range Status   Enterococcus faecalis NOT DETECTED NOT DETECTED Final   Enterococcus Faecium NOT  DETECTED NOT DETECTED Final   Listeria monocytogenes NOT DETECTED NOT DETECTED Final   Staphylococcus species NOT DETECTED NOT DETECTED Final   Staphylococcus aureus (BCID) NOT DETECTED NOT DETECTED Final   Staphylococcus epidermidis NOT DETECTED NOT DETECTED Final   Staphylococcus lugdunensis NOT DETECTED NOT DETECTED Final   Streptococcus species NOT DETECTED NOT DETECTED Final   Streptococcus agalactiae NOT DETECTED NOT DETECTED Final   Streptococcus pneumoniae NOT DETECTED NOT DETECTED Final   Streptococcus pyogenes NOT DETECTED NOT DETECTED Final   A.calcoaceticus-baumannii NOT DETECTED NOT DETECTED Final   Bacteroides fragilis NOT DETECTED NOT DETECTED Final   Enterobacterales DETECTED (A) NOT DETECTED Final    Comment: Enterobacterales represent a large order of gram negative bacteria, not a single organism. CRITICAL RESULT CALLED TO, READ BACK BY AND VERIFIED WITH: PHARMD JENNY ZHOU [phone] AT 1403 BY EC    Enterobacter cloacae complex NOT DETECTED NOT DETECTED Final   Escherichia coli NOT DETECTED NOT DETECTED Final   Klebsiella aerogenes NOT DETECTED NOT DETECTED Final   Klebsiella oxytoca NOT DETECTED NOT DETECTED Final   Klebsiella pneumoniae NOT DETECTED NOT DETECTED Final   Proteus species DETECTED (A) NOT DETECTED Final    Comment: CRITICAL RESULT CALLED TO, READ BACK BY AND VERIFIED WITH: PHARMD JENNY ZHOU [phone] AT 1403 BY EC    Salmonella species NOT DETECTED NOT DETECTED Final   Serratia marcescens NOT DETECTED NOT DETECTED Final   Haemophilus influenzae NOT DETECTED NOT DETECTED Final   Neisseria meningitidis NOT DETECTED NOT DETECTED Final   Pseudomonas aeruginosa NOT DETECTED NOT DETECTED Final   Stenotrophomonas maltophilia NOT DETECTED NOT DETECTED Final   Candida albicans NOT DETECTED NOT DETECTED Final   Candida auris NOT DETECTED NOT DETECTED Final   Candida glabrata NOT DETECTED NOT DETECTED Final   Candida krusei NOT DETECTED NOT DETECTED Final    Candida parapsilosis NOT DETECTED NOT DETECTED Final   Candida tropicalis NOT DETECTED NOT DETECTED Final   Cryptococcus neoformans/gattii NOT DETECTED NOT DETECTED Final   CTX-M ESBL NOT DETECTED NOT DETECTED Final   Carbapenem resistance IMP NOT DETECTED NOT DETECTED Final   Carbapenem resistance KPC NOT DETECTED NOT DETECTED Final   Carbapenem resistance NDM NOT DETECTED NOT DETECTED Final   Carbapenem resist OXA 48 LIKE NOT DETECTED NOT DETECTED Final   Carbapenem resistance VIM NOT DETECTED NOT DETECTED Final    Comment: Performed at [facility], [address] [place], [place] [address]  Urine Culture (for pregnant, neutropenic or urologic patients or patients with an indwelling urinary catheter)     Status: Abnormal (Preliminary result)   Collection Time: 03/19/23 12:12 AM   Specimen: Urine, Clean Catch  Result Value Ref Range Status   Specimen Description URINE, CLEAN CATCH  Final   Special Requests   Final  NONE Performed at [facility], [address], [place], [place] [address]    Culture 50,000 COLONIES/mL PROTEUS MIRABILIS (A)  Final   Report Status PENDING  Incomplete   Organism ID, Bacteria PROTEUS MIRABILIS (A)  Final      Susceptibility   Proteus mirabilis - MIC*    AMPICILLIN <=2 SENSITIVE Sensitive     CEFAZOLIN 8 SENSITIVE Sensitive     CEFEPIME <=0.12 SENSITIVE Sensitive     CEFTRIAXONE <=0.25 SENSITIVE Sensitive     CIPROFLOXACIN <=0.25 SENSITIVE Sensitive     GENTAMICIN <=1 SENSITIVE Sensitive     IMIPENEM 4 SENSITIVE Sensitive     NITROFURANTOIN 128 RESISTANT Resistant     TRIMETH/SULFA <=20 SENSITIVE Sensitive     AMPICILLIN/SULBACTAM <=2 SENSITIVE Sensitive     PIP/TAZO <=4 SENSITIVE Sensitive ug/mL    * 50,000 COLONIES/mL PROTEUS MIRABILIS  Surgical PCR screen     Status: Abnormal   Collection Time: 03/19/23  2:54 AM   Specimen: Nasal Mucosa; Nasal Swab  Result Value Ref Range Status   MRSA, PCR POSITIVE (A) NEGATIVE Final     Comment: RESULT CALLED TO, READ BACK BY AND VERIFIED WITH: S. VELDEZ RN 03/19/23 @ 0616 BY AB    Staphylococcus aureus POSITIVE (A) NEGATIVE Final    Comment: (NOTE) The Xpert SA Assay (FDA approved for NASAL specimens in patients 64 years of age and older), is one component of a comprehensive surveillance program. It is not intended to diagnose infection nor to guide or monitor treatment. Performed at [facility], [address], [place], [place] [address]     Please note: You were cared for by a hospitalist during your hospital stay. Once you are discharged, your primary care physician will handle any further medical issues. Please note that NO REFILLS for any discharge medications will be authorized once you are discharged, as it is imperative that you return to your primary care physician (or establish a relationship with a primary care physician if you do not have one) for your post hospital discharge needs so that they can reassess your need for medications and monitor your lab values.    Time coordinating discharge: 40 minutes  SIGNED:   Burnadette Pop, MD  [facility] 03/21/2023, 10:31 AM Pager [phone]  If 7PM-7AM, please contact night-coverage [email or web address] Password [email or web address]

## 2023-03-21 NOTE — NC FL2 (Signed)
[place] MEDICAID FL2 LEVEL OF CARE FORM     IDENTIFICATION  Patient Name: Holly Nunez Birthdate: Aug 24, 1924 Sex: female Admission Date (Current Location): 03/18/2023  [facility] and [place] Number:  Producer, television/film/video and Address:  [facility] [place], [address] [place], [place] [address]      Provider Number: [id number]  Attending Physician Name and Address:  Burnadette Pop, MD  Relative Name and Phone Number:       Current Level of Care: Hospital Recommended Level of Care: Skilled Nursing Facility Prior Approval Number:    Date Approved/Denied:   PASRR Number: [id number]  Discharge Plan: SNF    Current Diagnoses: Patient Active Problem List   Diagnosis Date Noted   Obstruction of right ureteropelvic junction (UPJ) due to stone 03/19/2023   Acute renal failure superimposed on stage 3a chronic kidney disease (HCC) 03/19/2023   Acute UTI 03/19/2023   Pyuria 05/18/2021   Cognitive deficits 05/18/2021   Pancreatic mass 05/18/2021   Abnormal ECG 05/15/2021   Fall 05/14/2021   Psoas hematoma, left, secondary to anticoagulant therapy 05/14/2021   CKD (chronic kidney disease), stage III (HCC) 05/14/2021   Unspecified atrial fibrillation (HCC) 05/14/2021   Pancreatic pseudocyst/cyst 05/14/2021   History of TIA (transient ischemic attack) 04/16/2020   Anticoagulation adequate 04/16/2020   Dyspepsia 08/21/2019   Trauma 09/23/2017   Debility 06/21/2017   Great toe pain, left 12/02/2016   Vitamin D deficiency 12/02/2016   Hyperglycemia 05/27/2016   Estrogen deficiency 05/27/2016   Overactive bladder 12/07/2015   Allergy 12/07/2015   Multinodular goiter (nontoxic) 04/25/2015   Preventative health care 02/26/2015   History of chicken pox    Renal insufficiency 03/07/2014   HTN (hypertension) 02/19/2014   Hyperlipidemia 02/19/2014   DJD (degenerative joint disease) 02/19/2014   S/P hysterectomy 02/19/2014   Anemia 02/19/2014   Urinary  incontinence 02/19/2014    Orientation RESPIRATION BLADDER Height & Weight     Self  Normal Continent Weight: 114 lb 13.8 oz (52.1 kg) Height:  5' (152.4 cm)  BEHAVIORAL SYMPTOMS/MOOD NEUROLOGICAL BOWEL NUTRITION STATUS      Incontinent Diet (See DC summary)  AMBULATORY STATUS COMMUNICATION OF NEEDS Skin   Extensive Assist Verbally Surgical wounds (Vaginal Incision)                       Personal Care Assistance Level of Assistance  Bathing, Feeding, Dressing Bathing Assistance: Maximum assistance Feeding assistance: Limited assistance Dressing Assistance: Maximum assistance     Functional Limitations Info  Sight, Hearing, Speech Sight Info: Adequate Hearing Info: Adequate Speech Info: Adequate    SPECIAL CARE FACTORS FREQUENCY  PT (By licensed PT), OT (By licensed OT)     PT Frequency: 5x week OT Frequency: 5x week            Contractures Contractures Info: Not present    Additional Factors Info  Code Status, Allergies Code Status Info: Full Allergies Info: Alphagan (Brimonidine)           Current Medications (03/21/2023):  This is the current hospital active medication list Current Facility-Administered Medications  Medication Dose Route Frequency Provider Last Rate Last Admin   acetaminophen (TYLENOL) tablet 650 mg  650 mg Oral Q6H PRN Stoneking, Danford Bad., MD       Or   acetaminophen (TYLENOL) suppository 650 mg  650 mg Rectal Q6H PRN Stoneking, Danford Bad., MD       amoxicillin-clavulanate (AUGMENTIN) 500-125 MG per tablet  1 tablet  1 tablet Oral BID Burnadette Pop, MD       atorvastatin (LIPITOR) tablet 20 mg  20 mg Oral Daily Stoneking, Danford Bad., MD   20 mg at 03/21/23 1013   brimonidine (ALPHAGAN) 0.2 % ophthalmic solution 1 drop  1 drop Both Eyes BID Milderd Meager., MD   1 drop at 03/21/23 1013   And   timolol (TIMOPTIC) 0.5 % ophthalmic solution 1 drop  1 drop Both Eyes BID Milderd Meager., MD   1 drop at 03/21/23 1008    Chlorhexidine Gluconate Cloth 2 % PADS 6 each  6 each Topical Q0600 Burnadette Pop, MD   6 each at 03/21/23 0613   latanoprost (XALATAN) 0.005 % ophthalmic solution 1 drop  1 drop Both Eyes QHS Stoneking, Danford Bad., MD   1 drop at 03/20/23 2120   mupirocin ointment (BACTROBAN) 2 % 1 Application  1 Application Nasal BID Burnadette Pop, MD   1 Application at 03/21/23 1008   oxybutynin (DITROPAN-XL) 24 hr tablet 10 mg  10 mg Oral QHS Milderd Meager., MD   10 mg at 03/20/23 2119   polyvinyl alcohol (LIQUIFILM TEARS) 1.4 % ophthalmic solution 2 drop  2 drop Both Eyes Q12H PRN Stoneking, Danford Bad., MD       prochlorperazine (COMPAZINE) injection 5 mg  5 mg Intravenous Q6H PRN Milderd Meager., MD   5 mg at 03/19/23 4098   senna-docusate (Senokot-S) tablet 1 tablet  1 tablet Oral QHS PRN Stoneking, Danford Bad., MD       sodium chloride flush (NS) 0.9 % injection 3 mL  3 mL Intravenous Q12H Stoneking, Danford Bad., MD   3 mL at 03/21/23 1012     Discharge Medications: Please see discharge summary for a list of discharge medications.  Relevant Imaging Results:  Relevant Lab Results:   Additional Information SS# [id number]  Carley Hammed, LCSW

## 2023-03-21 NOTE — Telephone Encounter (Signed)
-----   Message from [facility] sent at 03/20/2023 10:50 AM EST ----- Pleas schedule for post op appt in 2 weeks.

## 2023-03-21 NOTE — Progress Notes (Signed)
   2 Days Post-Op Subjective: No acute events overnight.  Patient was alert, oriented, and in no distress.  She is eager to return home.  She was accompanied by her daughter.  We reviewed the case and plan.  All questions were answered to their satisfaction.  Objective: Vital signs in last 24 hours: Temp:  [98.2 F (36.8 C)-98.5 F (36.9 C)] 98.3 F (36.8 C) (11/04 0349) Pulse Rate:  [50-56] 56 (11/04 0820) Resp:  [16-18] 17 (11/04 0820) BP: (129-153)/(49-73) 129/58 (11/04 0820) SpO2:  [99 %-100 %] 100 % (11/04 0820) Weight:  [52.1 kg] 52.1 kg (11/04 0500)  Assessment/Plan: # Right UPJ stone Stented by Dr. Dalbert Mayotte on 03/20/2023.  Will pursue definitive stone management with Dr. Laverle Hobby office.  Per facility is very close to that office. Renal indices have nearly returned to baseline.  No labs available today. Okay to discharge from a urologic perspective once medically clear. Please call with questions  Intake/Output from previous day: 11/03 0701 - 11/04 0700 In: 963 [P.O.:960; I.V.:3] Out: 600 [Urine:600]  Intake/Output this shift: Total I/O In: 220 [P.O.:220] Out: -   Physical Exam:  General: Alert and oriented CV: No cyanosis Lungs: equal chest rise Skin: Pure wick in place.  Draining concentrated clear yellow urine.  Lab Results: Recent Labs    03/18/23 2100 03/19/23 0808 03/20/23 0414  HGB 11.2* 10.3* 10.8*  HCT 37.5 30.5* 32.1*   BMET Recent Labs    03/19/23 0808 03/20/23 0414  NA 134* 134*  K 3.7 4.4  CL 104 103  CO2 24 20*  GLUCOSE 136* 132*  BUN 26* 26*  CREATININE 1.29* 1.16*  CALCIUM 8.8* 9.0     Studies/Results: No results found.    LOS: 2 days   Elmon Kirschner, NP [facility] Pager: [phone]  03/21/2023, 2:41 PM

## 2023-03-21 NOTE — Telephone Encounter (Signed)
Pt is going back to nursing home when she leaves the hospital. Will have nursing home call us to sch.

## 2023-03-21 NOTE — Plan of Care (Signed)

## 2023-03-21 NOTE — Telephone Encounter (Signed)
LVM to call and sch

## 2023-03-22 LAB — URINE CULTURE: Culture: 50000 — AB

## 2023-03-22 NOTE — TOC Transition Note (Signed)
Transition of Care [facility]) - CM/SW Discharge Note   Patient Details  Name: Holly Nunez MRN: [id number] Date of Birth: 08/17/1924  [facility] CM/SW Contact:  Carley Hammed, LCSW Phone Number: 03/22/2023, 8:45 AM   Clinical Narrative:     Pt to be transported to [facility] via [employer]. Nurse to call report to [phone].  Final next level of care: Skilled Nursing Facility Barriers to Discharge: Barriers Resolved   Patient Goals and CMS Choice CMS Medicare.gov Compare Post Acute Care list provided to:: Patient Choice offered to / list presented to : Adult Children, Patient  Discharge Placement                Patient chooses bed at: [facility] Patient to be transferred to facility by: [facility] Name of family member notified: Daughter Patient and family notified of of transfer: 03/22/23  Discharge Plan and Services Additional resources added to the After Visit Summary for       Post Acute Care Choice: Skilled Nursing Facility                               Social Determinants of Health (SDOH) Interventions SDOH Screenings   Food Insecurity: No Food Insecurity (03/19/2023)  Housing: Patient Declined (03/19/2023)  Transportation Needs: No Transportation Needs (03/19/2023)  Utilities: Not At Risk (03/19/2023)  Alcohol Screen: Low Risk  (01/29/2021)  Depression (PHQ2-9): Low Risk  (01/29/2021)  Financial Resource Strain: Low Risk  (01/29/2021)  Physical Activity: Inactive (01/29/2021)  Social Connections: Socially Isolated (01/29/2021)  Stress: No Stress Concern Present (01/29/2021)  Tobacco Use: Low Risk  (03/19/2023)     Readmission Risk Interventions     No data to display

## 2023-03-22 NOTE — Progress Notes (Signed)
Patient seen and examined at bedside today.  Hemodynamically stable.  Lying comfortably on bed.  Medically stable for discharge.  She has a bed at SNF today.  Discharge orders and summary are already in place.  No new complaints.  No new change  in the medical management.

## 2023-03-22 NOTE — Plan of Care (Signed)
  Problem: Pain Management: Goal: General experience of comfort will improve Outcome: Progressing   Problem: Safety: Goal: Ability to remain free from injury will improve Outcome: Progressing

## 2023-03-22 NOTE — Plan of Care (Signed)

## 2023-03-22 NOTE — Progress Notes (Signed)
Report called to [facility], given to [place], [employer].

## 2023-03-22 NOTE — Care Management Important Message (Signed)
Important Message  Patient Details  Name: Holly Nunez MRN: [id number] Date of Birth: 04-20-1925   Important Message Given:  Yes - Medicare IM Patient left IM will be mailed to home address  Dorena Bodo 03/22/2023, 1:37 PM

## 2023-04-04 ENCOUNTER — Other Ambulatory Visit: Payer: Self-pay | Admitting: Urology

## 2023-04-04 ENCOUNTER — Encounter: Payer: Self-pay | Admitting: Urology

## 2023-04-04 ENCOUNTER — Ambulatory Visit (HOSPITAL_BASED_OUTPATIENT_CLINIC_OR_DEPARTMENT_OTHER)
Admission: RE | Admit: 2023-04-04 | Discharge: 2023-04-04 | Disposition: A | Discharge status: Home / Self Care | Payer: No Typology Code available for payment source | Source: Ambulatory Visit | Attending: Urology | Admitting: Urology

## 2023-04-04 ENCOUNTER — Ambulatory Visit (INDEPENDENT_AMBULATORY_CARE_PROVIDER_SITE_OTHER): Payer: Medicare Other | Admitting: Urology

## 2023-04-04 VITALS — BP 122/79 | HR 76

## 2023-04-04 DIAGNOSIS — N201 Calculus of ureter: Secondary | ICD-10-CM | POA: Diagnosis not present

## 2023-04-04 DIAGNOSIS — Z87440 Personal history of urinary (tract) infections: Secondary | ICD-10-CM | POA: Insufficient documentation

## 2023-04-04 NOTE — Progress Notes (Signed)
Assessment: 1. Ureteral calculus, right   2. History of UTI     Plan: I personally reviewed the patient's chart including provider notes, lab and imaging results. I reviewed the KUB from today.  The right ureteral stent is in good position.  The previously noted proximal ureteral calculus is not easily seen on the study today. Options for management of the right proximal ureteral calculus discussed with the patient and her daughter today.  Since the stone is not readily visualized, I do not think that shockwave lithotripsy will be an option.  I discussed ureteroscopic laser lithotripsy.  I explained that we will need to make sure that her UTI is completely cleared prior to any surgical intervention. Will ask the rehab facility to obtain a urine for culture and fax results to my office at [phone]. Once the culture results are available, we can make arrangements for surgical management of the right ureteral calculus.  Chief Complaint: Chief Complaint  Patient presents with   Nephrolithiasis    HPI: Holly Nunez is a 89 y.o. female who presents for continued evaluation of a right ureteral calculus and UTI. She presented to the hospital on 03/19/2023 with a 9 mm right UPJ stone with associated UTI. Urine and blood cultures grew Proteus. She was treated with IV antibiotics and discharged on Augmentin. She has completed her antibiotics.  She is currently at [facility].  She reports no flank pain.  No fevers or chills.  No problems voiding.  No gross hematuria.   Portions of the above documentation were copied from a prior visit for review purposes only.  Allergies: Allergies  Allergen Reactions   Alphagan [Brimonidine] Other (See Comments)    Secondary infection and vision changes    PMH: Past Medical History:  Diagnosis Date   Arthritis    Estrogen deficiency 05/27/2016   Great toe pain, left 12/02/2016   H/O measles    H/O mumps    History of chicken pox     Hyperglycemia 05/27/2016   Hyperlipidemia    Hypertension    Patent foramen ovale    Preventative health care 02/26/2015   Renal insufficiency    Thyroid nodule    Biopsy-negative   TIA (transient ischemic attack)    Urinary incontinence     PSH: Past Surgical History:  Procedure Laterality Date   ABDOMINAL HYSTERECTOMY     fibroid   APPENDECTOMY     CYSTOSCOPY WITH RETROGRADE PYELOGRAM, URETEROSCOPY AND STENT PLACEMENT Right 03/19/2023   Procedure: CYSTO-URETEROSCOPY; RIGHT URETERAL STENT PLACEMENT;  Surgeon: Milderd Meager., MD;  Location: [facility] OR;  Service: Urology;  Laterality: Right;   EYE SURGERY Bilateral    CATARACT REMOVAL   JOINT REPLACEMENT     REPLACEMENT TOTAL KNEE Right 2007   THYROID LOBECTOMY Left 04/25/2015   THYROID LOBECTOMY Left 04/25/2015   Procedure: LEFT THYROID LOBECTOMY;  Surgeon: Darnell Level, MD;  Location: [facility] OR;  Service: General;  Laterality: Left;   TRIGGER FINGER RELEASE  2007    SH: Social History   Tobacco Use   Smoking status: Never   Smokeless tobacco: Never  Vaping Use   Vaping status: Never Used  Substance Use Topics   Alcohol use: No   Drug use: No    ROS: Constitutional:  Negative for fever, chills, weight loss CV: Negative for chest pain, previous MI, hypertension Respiratory:  Negative for shortness of breath, wheezing, sleep apnea, frequent cough GI:  Negative for nausea, vomiting, bloody stool, GERD  PE: BP 122/79   Pulse 76  GENERAL APPEARANCE:  Well appearing, well developed, well nourished, NAD HEENT:  Atraumatic, normocephalic, oropharynx clear NECK:  Supple without lymphadenopathy or thyromegaly ABDOMEN:  Soft, non-tender, no masses EXTREMITIES:  Moves all extremities well, without clubbing, cyanosis, or edema NEUROLOGIC:  Alert and oriented x 3, in wheelchair, CN II-XII grossly intact MENTAL STATUS:  appropriate BACK:  Non-tender to palpation, No CVAT SKIN:  Warm, dry, and intact   Results: Patient  unable to provide sample today.

## 2023-04-17 ENCOUNTER — Emergency Department (HOSPITAL_COMMUNITY)
Admission: EM | Admit: 2023-04-17 | Discharge: 2023-04-17 | Disposition: A | Discharge status: Home / Self Care | Payer: Medicare Other

## 2023-04-17 ENCOUNTER — Encounter (HOSPITAL_COMMUNITY): Payer: Self-pay

## 2023-04-17 ENCOUNTER — Other Ambulatory Visit: Payer: Self-pay

## 2023-04-17 DIAGNOSIS — R55 Syncope and collapse: Secondary | ICD-10-CM | POA: Insufficient documentation

## 2023-04-17 DIAGNOSIS — Z7982 Long term (current) use of aspirin: Secondary | ICD-10-CM | POA: Diagnosis not present

## 2023-04-17 DIAGNOSIS — Z79899 Other long term (current) drug therapy: Secondary | ICD-10-CM | POA: Insufficient documentation

## 2023-04-17 DIAGNOSIS — Z8673 Personal history of transient ischemic attack (TIA), and cerebral infarction without residual deficits: Secondary | ICD-10-CM | POA: Diagnosis not present

## 2023-04-17 DIAGNOSIS — I10 Essential (primary) hypertension: Secondary | ICD-10-CM | POA: Insufficient documentation

## 2023-04-17 LAB — CBC
HCT: 32.3 % — ABNORMAL LOW (ref 36.0–46.0)
Hemoglobin: 10.4 g/dL — ABNORMAL LOW (ref 12.0–15.0)
MCH: 31.1 pg (ref 26.0–34.0)
MCHC: 32.2 g/dL (ref 30.0–36.0)
MCV: 96.7 fL (ref 80.0–100.0)
Platelets: 186 10*3/uL (ref 150–400)
RBC: 3.34 MIL/uL — ABNORMAL LOW (ref 3.87–5.11)
RDW: 13.7 % (ref 11.5–15.5)
WBC: 5.8 10*3/uL (ref 4.0–10.5)
nRBC: 0 % (ref 0.0–0.2)

## 2023-04-17 LAB — BASIC METABOLIC PANEL
Anion gap: 8 (ref 5–15)
BUN: 16 mg/dL (ref 8–23)
CO2: 24 mmol/L (ref 22–32)
Calcium: 8.9 mg/dL (ref 8.9–10.3)
Chloride: 104 mmol/L (ref 98–111)
Creatinine, Ser: 0.82 mg/dL (ref 0.44–1.00)
GFR, Estimated: >60 mL/min (ref >60)
Glucose, Bld: 109 mg/dL — ABNORMAL HIGH (ref 70–99)
Potassium: 4.4 mmol/L (ref 3.5–5.1)
Sodium: 136 mmol/L (ref 135–145)

## 2023-04-17 LAB — MAGNESIUM: Magnesium: 1.7 mg/dL (ref 1.7–2.4)

## 2023-04-17 MED ORDER — ACETAMINOPHEN 325 MG PO TABS
650.0000 mg | ORAL_TABLET | Freq: Once | ORAL | Status: AC
  Administered 2023-04-17: 650 mg via ORAL
  Filled 2023-04-17: qty 2

## 2023-04-17 NOTE — ED Notes (Signed)
Ptar called 

## 2023-04-17 NOTE — ED Triage Notes (Signed)
Pt bib ems from [facility]. C/O syncopal episode. [place] when staff noticed pt had a syncopal episode that lasted for 2 minutes. The staff took pt back to room and checked vitals which were stable. Pt states she feels fine.    BP 116/54 HR 63 RA 100% CBG 165

## 2023-04-17 NOTE — ED Notes (Signed)
Pt provided some water

## 2023-04-17 NOTE — ED Provider Notes (Signed)
[facility] EMERGENCY DEPARTMENT AT [facility] Provider Note   CSN: [id number] Arrival date & time: 04/17/23  1158     History  Chief Complaint  Patient presents with   Near Syncope    Holly Nunez is a 89 y.o. female.  89 year old female with past medical history of TIA, hypertension, and hyperlipidemia presenting to the emergency department today after she was difficult to arouse.  The patient was in her wheelchair at the time.  She did not fall or injure herself.  The patient does not recall this.  There was no reported seizure activity.  The patient was brought back to her room and her vital signs were normal.  She is back to her normal state of health.  The patient does not remember this episode.  She denies any complaints currently.        Home Medications Prior to Admission medications   Medication Sig Start Date End Date Taking? Authorizing Provider  acetaminophen (TYLENOL) 650 MG CR tablet Take 2 tablets (1,300 mg total) by mouth every 6 (six) hours as needed for pain (for mild pain). Patient taking differently: Take 1,300 mg by mouth every 8 (eight) hours as needed for pain. 05/15/21  Yes Calvert Cantor, MD  aspirin EC 81 MG tablet Take 81 mg by mouth daily. Swallow whole.   Yes [provider]  atorvastatin (LIPITOR) 20 MG tablet Take 20 mg by mouth daily.   Yes [provider]  COMBIGAN 0.2-0.5 % ophthalmic solution Place 1 drop into both eyes every 12 (twelve) hours. 03/14/23  Yes [provider]  Dextran 70-Hypromellose 0.1-0.3 % SOLN Apply 2 drops to eye every 12 (twelve) hours as needed (dry, itchy, irritated eyes).   Yes [provider]  dextromethorphan-guaiFENesin (ROBITUSSIN-DM) 10-100 MG/5ML liquid Take 10 mLs by mouth every 6 (six) hours as needed for cough.   Yes [provider]  folic acid (FOLVITE) 1 MG tablet Take 1 mg by mouth daily.   Yes [provider]  latanoprost (XALATAN) 0.005 %  ophthalmic solution Place 1 drop into both eyes at bedtime. 03/10/23  Yes [provider]  Menthol, Topical Analgesic, (BIOFREEZE COOL THE PAIN) 4 % GEL Apply 1 Application topically 3 (three) times daily.   Yes [provider]  Multiple Vitamin (MULTIVITAMIN) tablet Take 1 tablet by mouth daily.   Yes [provider]  nitroGLYCERIN (NITROSTAT) 0.4 MG SL tablet Place 1 tablet (0.4 mg total) under the tongue every 5 (five) minutes as needed for chest pain. 10/01/20 04/17/23 Yes Lewayne Bunting, MD  Nutritional Supplements (NUTRITIONAL DRINK PO) Take 1 Dose by mouth daily. House shake   Yes [provider]  Nutritional Supplements (NUTRITIONAL DRINK PO) Take 120 mLs by mouth 2 (two) times daily. Med Pass   Yes [provider]  Omega-3 Fatty Acids (FISH OIL PO) Take 1 capsule by mouth daily.   Yes [provider]  ondansetron (ZOFRAN) 4 MG tablet Take 4 mg by mouth every 8 (eight) hours as needed for nausea or vomiting.   Yes [provider]  polyethylene glycol (MIRALAX / GLYCOLAX) 17 g packet Take 17 g by mouth daily.   Yes [provider]  Probiotic Product (PROBIOTIC PO) Take 1 tablet by mouth daily.   Yes [provider]  Trolamine Salicylate (ASPERCREME EX) Apply 1 application  topically in the morning and at bedtime.   Yes [provider]  oxybutynin (DITROPAN-XL) 10 MG 24 hr tablet TAKE 1  TABLET(10 MG) BY MOUTH AT BEDTIME Patient not taking: Reported on 04/04/2023 09/14/21   Bradd Canary, MD      Allergies    Alphagan [brimonidine]    Review of Systems   Review of Systems  Neurological:  Positive for syncope.  All other systems reviewed and are negative.   Physical Exam Updated Vital Signs BP (!) 110/56   Pulse (!) 52   Temp (!) 97.5 F (36.4 C) (Oral)   Resp (!) 22   Ht 4\' 9"  (1.448 m)   Wt 55.8 kg   SpO2 100%   BMI 26.62 kg/m  Physical Exam Vitals and nursing note reviewed.   Gen:  NAD Eyes: PERRL, EOMI HEENT: no oropharyngeal swelling Neck: trachea midline Resp: clear to auscultation bilaterally Card: RRR, no murmurs, rubs, or gallops Abd: nontender, nondistended Extremities: no calf tenderness, no edema Vascular: 2+ radial pulses bilaterally, 2+ DP pulses bilaterally Neuro: Cns intact, equal strength and sensation throughout bilateral upper and lower extremities Skin: no rashes Psyc: acting appropriately   ED Results / Procedures / Treatments   Labs (all labs ordered are listed, but only abnormal results are displayed) Labs Reviewed  CBC - Abnormal; Notable for the following components:      Result Value   RBC 3.34 (*)    Hemoglobin 10.4 (*)    HCT 32.3 (*)    All other components within normal limits  BASIC METABOLIC PANEL - Abnormal; Notable for the following components:   Glucose, Bld 109 (*)    All other components within normal limits  MAGNESIUM    EKG EKG Interpretation Date/Time:  Sunday April 17 2023 12:42:58 EST Ventricular Rate:  60 PR Interval:  188 QRS Duration:  82 QT Interval:  440 QTC Calculation: 440 R Axis:   88  Text Interpretation: Sinus rhythm Atrial premature complex Borderline right axis deviation Low voltage, extremity and precordial leads Confirmed by Beckey Downing [phone]) on 04/17/2023 1:00:28 PM  Radiology No results found.  Procedures Procedures    Medications Ordered in ED Medications  acetaminophen (TYLENOL) tablet 650 mg (650 mg Oral Given 04/17/23 1502)    ED Course/ Medical Decision Making/ A&P                                 Medical Decision Making 89 year old female with past medical history of hypertension and hyperlipidemia presenting to the emergency department today after a syncopal episode.  I will further evaluate the patient here with basic labs to evaluate for anemia or electrolyte abnormalities.  Will obtain EKG and give patient on the cardiac monitor to evaluate for arrhythmias.  The patient  is otherwise well-appearing with stable vital signs.  Will reevaluate for ultimate disposition.  The patient's workup here is reassuring.  Her EKG does not show any concerning findings.  We did discuss admission versus going back to her nursing facility with her favorable workup here.  Ultimately, she is discharged through shared decision making.  Amount and/or Complexity of Data Reviewed Labs: ordered.  Risk OTC drugs.           Final Clinical Impression(s) / ED Diagnoses Final diagnoses:  Syncope, unspecified syncope type    Rx / DC Orders ED Discharge Orders     None         Durwin Glaze, MD 04/17/23 [phone]

## 2023-04-17 NOTE — ED Notes (Signed)
RN attempted to call [facility]. Phone rang until voicemail.

## 2023-04-17 NOTE — ED Notes (Signed)
This RN called son to update him that Holly Nunez is present to transport pt back to [facility].

## 2023-04-17 NOTE — ED Notes (Signed)
RN ordered pt a tray

## 2023-04-17 NOTE — Discharge Instructions (Signed)
If they are able please have the nursing staff check your vital signs more frequently overnight tonight.  Please get let your nurses know if you feel unwell.  Return to the emergency department for worsening symptoms.

## 2023-04-18 ENCOUNTER — Telehealth: Payer: Self-pay

## 2023-04-18 NOTE — Telephone Encounter (Signed)
Left msg with the nurse at [facility]. She stated she would call back.

## 2023-04-18 NOTE — Telephone Encounter (Signed)
-----   Message from Di Kindle sent at 04/18/2023  1:55 PM EST ----- Please call [facility] to see if a urine sample was sent for culture.  I requested this at her visit on 11/18 but have not seen any results.

## 2023-04-19 ENCOUNTER — Encounter: Payer: Self-pay | Admitting: Internal Medicine

## 2023-04-22 ENCOUNTER — Telehealth: Payer: Self-pay

## 2023-04-22 ENCOUNTER — Ambulatory Visit: Payer: Self-pay | Admitting: Urology

## 2023-04-22 ENCOUNTER — Other Ambulatory Visit: Payer: Self-pay | Admitting: Urology

## 2023-04-22 DIAGNOSIS — N201 Calculus of ureter: Secondary | ICD-10-CM

## 2023-04-22 MED ORDER — CEPHALEXIN 250 MG PO CAPS: 250.0000 mg | ORAL_CAPSULE | Freq: Every day | ORAL | 1 refills | Status: DC

## 2023-04-22 NOTE — Telephone Encounter (Signed)
   Name: Holly Nunez  DOB: 1924/10/21  MRN: [id number]  Primary Cardiologist: Olga Millers, MD  Chart reviewed as part of pre-operative protocol coverage. Because of Holly Nunez past medical history and time since last visit, she will require a follow-up in-office visit in order to better assess preoperative cardiovascular risk.  Pre-op covering staff: - Please schedule appointment and call patient to inform them. If patient already had an upcoming appointment within acceptable timeframe, please add "pre-op clearance" to the appointment notes so provider is aware. - Please contact requesting surgeon's office via preferred method (i.e, phone, fax) to inform them of need for appointment prior to surgery.   Roe Rutherford Tarrie Mcmichen, PA  04/22/2023, 4:31 PM

## 2023-04-22 NOTE — Telephone Encounter (Signed)
   Pre-operative Risk Assessment    Patient Name: Holly Nunez  DOB: 04/09/1925 MRN: [id number]    LAST OV: 10/01/20 WITH DR. CRENSHAW  NEXT OV: NONE  Request for Surgical Clearance    Procedure:   CYTO/RETROGRADE/LITHOTRIPSY/STENT EXCHANGE  Date of Surgery:  Clearance 05/18/23                                 Surgeon:  DR. Di Kindle Surgeon's Group or Practice Name:  [facility] Phone number:  [phone] Fax number:  [phone]   Type of Clearance Requested:   - Medical  - Pharmacy:  Hold Aspirin NEEDS TO NOW WHEN TO HOLD   Type of Anesthesia:  Not Indicated   Additional requests/questions:    SignedMichaelle Copas   04/22/2023, 4:16 PM

## 2023-04-22 NOTE — Telephone Encounter (Signed)
Spoke with patient's daughter (DPR on file) who is agreeable to do an in office visit on 12/31 with Micah Flesher, PA at 1:55 pm.

## 2023-04-22 NOTE — Telephone Encounter (Signed)
-----   Message from Di Kindle sent at 04/22/2023 10:31 AM EST ----- Please call [facility] and give order for Cephalexin 250 mg PO daily for UTI prevention prior to surgery for kidney stone.

## 2023-04-22 NOTE — Telephone Encounter (Signed)
Spoke with Sue Lush at [facility]. Gave verbal orders as indicated to continue until 05/24/23 when pt is scheduled for surgery. Sue Lush took order and verbalized understanding.

## 2023-04-28 NOTE — Progress Notes (Addendum)
For Anesthesia: PCP -  Cardiologist - Lewayne Bunting, MD   Chest x-ray - greater than 1 year EKG - 04/17/23 IN [facility] Stress Test - 02/27/15 IN [facility] ECHO - 05/15/21 in [facility] Cardiac Cath -  Pacemaker/ICD device last checked: Pacemaker orders received: Device Rep notified:  Spinal Cord Stimulator:  Sleep Study -  CPAP -   Fasting Blood Sugar -  Checks Blood Sugar _____ times a day Date and result of last Hgb A1c-  Last dose of GLP1 agonist-  GLP1 instructions:   Last dose of SGLT-2 inhibitors-  SGLT-2 instructions:  Blood Thinner Instructions: Aspirin Instructions: Last Dose:  Activity level: Can go up a flight of stairs and activities of daily living without stopping and without chest pain and/or shortness of breath   Able to exercise without chest pain and/or shortness of breath   Unable to go up a flight of stairs without chest pain and/or shortness of breath     Anesthesia review: A. Fib, history of PFO, HTN, TIA  Patient denies shortness of breath, fever, cough and chest pain during pre op phone call.   Patient verbalized understanding of instructions reviewed via telephone.

## 2023-05-15 NOTE — Progress Notes (Unsigned)
Cardiology Office Note:    Date:  05/15/2023   ID:  [name], DOB Apr 28, 1925, MRN [id number]  PCP:  Default, Provider, MD    [facility] Prov[facility] Cardiologist:  Olga Millers, MD { Click to update primary MD,subspecialty MD or APP then REFRESH:1}    Referring MD: No ref. provider found   No chief complaint on file. ***  History of Present Illness:    Holly Nunez is a 89 y.o. female with a hx of TIA, HTN, HLD, patent foramen ovale.  She previously lived in [place].  Echocardiogram November 2014 showed normal LV function, mild to moderate LVH, grade 2 DD, aneurysmal atrial septum with PFO.  She has a known history of TIAs and was initially treated with Plavix and aspirin but due to recurrence is now on low-dose Xarelto.  She has coronary calcification on CT in 2016.  Nuclear stress test 02/2015 with normal perfusion. She was last seen by Dr. Jens Som 2022 and was doing well.   She was brought to the ER 04/17/23 after being found difficult to arouse in there wheelchair. She was treated for UTI. She is now pending cystoscopy with lithotripsy and stent placement.  She presents today for preoperative risk evaluation prior to cystoscopy with stent placement with Dr. Pete Glatter for ureteral stones.       Recurrent TIAs despite antiplatelet therapy Patent foramen ovale - previously placed on xarelto   Coronary calcification - seen on CT scan - no history of MI, PCI, sternotomy   Preoperative risk evaluation prior to cystoscopy She has a history of coronary calcification but no MI or PCI.  She does have a history of TIAs despite antiplatelet therapy with a present patent foramen ovale. She has known grade 2 DD, but does not require standing diuretic and has had no recent hospitalizations for CHF exacerbations.  According to the RCRI, she has between 1-6.6% risk of MACE. She is wheelchair bound and is not able to complete 4.0 METS. Given that surgery is needed for  stones with recent UTI requiring ER evaluation and lack of cardiac symptoms, we discussed moving forward with surgery without additional cardiovascular testing.  She understands her risk.   Past Medical History:  Diagnosis Date   Arthritis    Estrogen deficiency 05/27/2016   Great toe pain, left 12/02/2016   H/O measles    H/O mumps    History of chicken pox    Hyperglycemia 05/27/2016   Hyperlipidemia    Hypertension    Patent foramen ovale    Preventative health care 02/26/2015   Renal insufficiency    Thyroid nodule    Biopsy-negative   TIA (transient ischemic attack)    Urinary incontinence     Past Surgical History:  Procedure Laterality Date   ABDOMINAL HYSTERECTOMY     fibroid   APPENDECTOMY     CYSTOSCOPY WITH RETROGRADE PYELOGRAM, URETEROSCOPY AND STENT PLACEMENT Right 03/19/2023   Procedure: CYSTO-URETEROSCOPY; RIGHT URETERAL STENT PLACEMENT;  Surgeon: Milderd Meager., MD;  Location: [facility] OR;  Service: Urology;  Laterality: Right;   EYE SURGERY Bilateral    CATARACT REMOVAL   JOINT REPLACEMENT     REPLACEMENT TOTAL KNEE Right 2007   THYROID LOBECTOMY Left 04/25/2015   THYROID LOBECTOMY Left 04/25/2015   Procedure: LEFT THYROID LOBECTOMY;  Surgeon: Darnell Level, MD;  Location: [facility] OR;  Service: General;  Laterality: Left;   TRIGGER FINGER RELEASE  2007    Current Medications: No outpatient medications have been marked as taking for  the 05/17/23 encounter (Appointment) with Marcelino Duster, PA.     Allergies:   Alphagan [brimonidine]   Social History   Socioeconomic History   Marital status: Widowed    Spouse name: Not on file   Number of children: 4   Years of education: Not on file   Highest education level: Not on file  Occupational History   Not on file  Tobacco Use   Smoking status: Never   Smokeless tobacco: Never  Vaping Use   Vaping status: Never Used  Substance and Sexual Activity   Alcohol use: No   Drug use: No   Sexual activity: Not  Currently    Partners: Male    Comment: no dietary restrictions, lives with 2 daughters  Other Topics Concern   Not on file  Social History Narrative   No dietary restrictions and lives with daughters.   Social Drivers of Corporate investment banker Strain: Low Risk  (01/29/2021)   Overall Financial Resource Strain (CARDIA)    Difficulty of Paying Living Expenses: Not hard at all  Food Insecurity: No Food Insecurity (03/19/2023)   Hunger Vital Sign    Worried About Running Out of Food in the Last Year: Never true    Ran Out of Food in the Last Year: Never true  Transportation Needs: No Transportation Needs (03/19/2023)   PRAPARE - Administrator, Civil Service (Medical): No    Lack of Transportation (Non-Medical): No  Physical Activity: Inactive (01/29/2021)   Exercise Vital Sign    Days of Exercise per Week: 0 days    Minutes of Exercise per Session: 0 min  Stress: No Stress Concern Present (01/29/2021)   [employer] of Occupational Health - Occupational Stress Questionnaire    Feeling of Stress : Not at all  Social Connections: Socially Isolated (01/29/2021)   Social Connection and Isolation Panel [NHANES]    Frequency of Communication with Friends and Family: More than three times a week    Frequency of Social Gatherings with Friends and Family: More than three times a week    Attends Religious Services: Never    Database administrator or Organizations: No    Attends Banker Meetings: Never    Marital Status: Widowed     Family History: The patient's ***family history includes Bone cancer in her sister; Cancer in her brother, daughter, daughter, maternal grandmother, sister, sister, and sister; Gout in her brother; Heart disease in her mother; Hypertension in her daughter, mother, and son; Irritable bowel syndrome in her daughter; Kidney disease in her sister; Mental illness in her sister; Pancreatic cancer in her daughter; Prostate cancer in her  brother; Thyroid disease in her daughter and daughter.  ROS:   Please see the history of present illness.    *** All other systems reviewed and are negative.  EKGs/Labs/Other Studies Reviewed:    The following studies were reviewed today: ***      Recent Labs: 03/18/2023: ALT 16 04/17/2023: BUN 16; Creatinine, Ser 0.82; Hemoglobin 10.4; Magnesium 1.7; Platelets 186; Potassium 4.4; Sodium 136  Recent Lipid Panel    Component Value Date/Time   CHOL 133 03/19/2021 1351   TRIG 56.0 03/19/2021 1351   HDL 72.70 03/19/2021 1351   CHOLHDL 2 03/19/2021 1351   VLDL 11.2 03/19/2021 1351   LDLCALC 49 03/19/2021 1351   LDLCALC 59 03/04/2020 1123     Risk Assessment/Calculations:   {Does this patient have ATRIAL FIBRILLATION?:[phone]}  No BP recorded.  {  Refresh Note OR Click here to enter BP  :1}***         Physical Exam:    VS:  There were no vitals taken for this visit.    Wt Readings from Last 3 Encounters:  04/17/23 123 lb (55.8 kg)  03/22/23 121 lb 0.5 oz (54.9 kg)  01/29/21 148 lb (67.1 kg)     GEN: *** Well nourished, well developed in no acute distress HEENT: Normal NECK: No JVD; No carotid bruits LYMPHATICS: No lymphadenopathy CARDIAC: ***RRR, no murmurs, rubs, gallops RESPIRATORY:  Clear to auscultation without rales, wheezing or rhonchi  ABDOMEN: Soft, non-tender, non-distended MUSCULOSKELETAL:  No edema; No deformity  SKIN: Warm and dry NEUROLOGIC:  Alert and oriented x 3 PSYCHIATRIC:  Normal affect   ASSESSMENT:    No diagnosis found. PLAN:    In order of problems listed above:  ***      {Are you ordering a CV Procedure (e.g. stress test, cath, DCCV, TEE, etc)?   Press F2        :[id number]}    Medication Adjustments/Labs and Tests Ordered: Current medicines are reviewed at length with the patient today.  Concerns regarding medicines are outlined above.  No orders of the defined types were placed in this encounter.  No orders of the defined  types were placed in this encounter.   There are no Patient Instructions on file for this visit.   Signed, Marcelino Duster, [place]  05/15/2023 11:07 AM    [facility]

## 2023-05-17 ENCOUNTER — Ambulatory Visit: Payer: Medicare Other | Admitting: Physician Assistant

## 2023-05-17 ENCOUNTER — Telehealth: Payer: Self-pay

## 2023-05-17 NOTE — Telephone Encounter (Signed)
 Pt was scheduled today with the cardiologist to get cardiac clearance for her upcoming surgery on 05/24/23. The [facility] facility where she lives missed the appt and cardiology cannot see her until late January.   I will cancel her surgery. The daughter, Onedia, asked if there was anything to do int he mean time.   Please advise.

## 2023-05-20 NOTE — Telephone Encounter (Signed)
 Spoke with daughter, Donica, who is aware that the surgery for 05/24/23 had been canceled and when we hear from cariology for clearance, we can reschedule.   Daughter asked that we call Lavern Herald at the facility where patient lives for any scheduling and transportation needs: [phone]

## 2023-05-24 ENCOUNTER — Encounter (HOSPITAL_COMMUNITY): Admission: RE | Payer: Self-pay | Source: Home / Self Care

## 2023-05-24 ENCOUNTER — Ambulatory Visit (HOSPITAL_COMMUNITY): Admission: RE | Admit: 2023-05-24 | Payer: Medicare Other | Source: Home / Self Care | Admitting: Urology

## 2023-05-24 SURGERY — CYSTOURETEROSCOPY, WITH RETROGRADE PYELOGRAM AND STENT INSERTION
Anesthesia: General | Laterality: Right

## 2023-05-31 ENCOUNTER — Encounter: Payer: Medicare Other | Admitting: Urology

## 2023-06-06 NOTE — Progress Notes (Deleted)
Cardiology Clinic Note   Patient Name: Natassja Salmen Date of Encounter: 06/06/2023  Primary Care Provider:  Default, Provider, MD Primary Cardiologist:  Olga Millers, MD  Patient Profile    Chiqueta Lotti 89 year old female presents to the clinic today for follow-up evaluation of her hypertension and hyperlipidemia.  Past Medical History    Past Medical History:  Diagnosis Date   Arthritis    Estrogen deficiency 05/27/2016   Great toe pain, left 12/02/2016   H/O measles    H/O mumps    History of chicken pox    Hyperglycemia 05/27/2016   Hyperlipidemia    Hypertension    Patent foramen ovale    Preventative health care 02/26/2015   Renal insufficiency    Thyroid nodule    Biopsy-negative   TIA (transient ischemic attack)    Urinary incontinence    Past Surgical History:  Procedure Laterality Date   ABDOMINAL HYSTERECTOMY     fibroid   APPENDECTOMY     CYSTOSCOPY WITH RETROGRADE PYELOGRAM, URETEROSCOPY AND STENT PLACEMENT Right 03/19/2023   Procedure: CYSTO-URETEROSCOPY; RIGHT URETERAL STENT PLACEMENT;  Surgeon: Milderd Meager., MD;  Location: [facility] OR;  Service: Urology;  Laterality: Right;   EYE SURGERY Bilateral    CATARACT REMOVAL   JOINT REPLACEMENT     REPLACEMENT TOTAL KNEE Right 2007   THYROID LOBECTOMY Left 04/25/2015   THYROID LOBECTOMY Left 04/25/2015   Procedure: LEFT THYROID LOBECTOMY;  Surgeon: Darnell Level, MD;  Location: [facility];  Service: General;  Laterality: Left;   TRIGGER FINGER RELEASE  2007    Allergies  Allergies  Allergen Reactions   Alphagan [Brimonidine] Other (See Comments)    Secondary infection and vision changes    History of Present Illness    Glynna Mohamud has a PMH of TIA, hypertension, hyperlipidemia, and PFO.  She previously lived in [place].  Echocardiogram 11/14 showed normal LV EF, mild-moderate LVH, and G2 DD.  She was noted to have aneurysmal atrial septum with PFO.  She has a known history of TIAs and was  initially treated with Plavix and aspirin.  Due to reoccurrence she was placed on Xarelto.  She had coronary calcium noted on CT in 2016.  She underwent stress testing 10/16 which showed normal perfusion.  She was seen in follow-up by Dr. Jens Som on 2022.  She was doing well at that time.  She presented to the ED 04/17/2023 after being difficult to arouse and her wheelchair.  She was treated for UTI.  She is pending cystoscopy with lithotripsy and stent placement.  She presents to the clinic today for follow-up evaluation and preoperative cardiac evaluation.  She states***.  *** denies chest pain, shortness of breath, lower extremity edema, fatigue, palpitations, melena, hematuria, hemoptysis, diaphoresis, weakness, presyncope, syncope, orthopnea, and PND.  Essential hypertension-BP today Maintain blood pressure log Heart healthy low-sodium diet Maintain physical activity  Hyperlipidemia-LDL***. High-fiber diet Maintain physical activity Continue statin therapy  History of TIA-neurologically intact.  TIAs noted in the setting of patent PFO and atrial septal aneurysm.  Events and outside hospital.  She was previously on aspirin and Plavix and was transition to Xarelto due to recurrent episodes.   Denies bleeding issues. Continue current medical therapy Continue to monitor for bleeding Reviewed fall precautions  Home Medications    Prior to Admission medications   Medication Sig Start Date End Date Taking? Authorizing Provider  cephALEXin (KEFLEX) 250 MG capsule Take 1 capsule (250 mg total) by mouth daily. 04/22/23  Stoneking, Danford Bad., MD  acetaminophen (TYLENOL) 650 MG CR tablet Take 2 tablets (1,300 mg total) by mouth every 6 (six) hours as needed for pain (for mild pain). Patient taking differently: Take 1,300 mg by mouth every 8 (eight) hours as needed for pain. 05/15/21   Calvert Cantor, MD  aspirin EC 81 MG tablet Take 81 mg by mouth daily. Swallow whole.    [provider]  atorvastatin (LIPITOR) 20 MG tablet Take 20 mg by mouth daily.    [provider]  COMBIGAN 0.2-0.5 % ophthalmic solution Place 1 drop into both eyes every 12 (twelve) hours. 03/14/23   [provider]  Dextran 70-Hypromellose 0.1-0.3 % SOLN Apply 2 drops to eye every 12 (twelve) hours as needed (dry, itchy, irritated eyes).    [provider]  dextromethorphan-guaiFENesin (ROBITUSSIN-DM) 10-100 MG/5ML liquid Take 10 mLs by mouth every 6 (six) hours as needed for cough.    [provider]  folic acid (FOLVITE) 1 MG tablet Take 1 mg by mouth daily.    [provider]  latanoprost (XALATAN) 0.005 % ophthalmic solution Place 1 drop into both eyes at bedtime. 03/10/23   [provider]  Menthol, Topical Analgesic, (BIOFREEZE COOL THE PAIN) 4 % GEL Apply 1 Application topically 3 (three) times daily.    [provider]  Multiple Vitamin (MULTIVITAMIN) tablet Take 1 tablet by mouth daily.    [provider]  nitroGLYCERIN (NITROSTAT) 0.4 MG SL tablet Place 1 tablet (0.4 mg total) under the tongue every 5 (five) minutes as needed for chest pain. 10/01/20 04/17/23  Lewayne Bunting, MD  Nutritional Supplements (NUTRITIONAL DRINK PO) Take 1 Dose by mouth daily. House shake    [provider]  Nutritional Supplements (NUTRITIONAL DRINK PO) Take 120 mLs by mouth 2 (two) times daily. Med Pass    [provider]  Omega-3 Fatty Acids (FISH OIL PO) Take 1 capsule by mouth daily.    [provider]  ondansetron (ZOFRAN) 4 MG tablet Take 4 mg by mouth every 8 (eight) hours as needed for nausea or vomiting.    [provider]  oxybutynin (DITROPAN-XL) 10 MG 24 hr tablet TAKE 1 TABLET(10 MG) BY MOUTH AT BEDTIME Patient not taking: Reported on 04/04/2023 09/14/21   Bradd Canary, MD  polyethylene glycol (MIRALAX / GLYCOLAX) 17 g packet Take 17 g by mouth daily.    [provider]   Probiotic Product (PROBIOTIC PO) Take 1 tablet by mouth daily.    [provider]  Trolamine Salicylate (ASPERCREME EX) Apply 1 application  topically in the morning and at bedtime.    [provider]    Family History    Family History  Problem Relation Age of Onset   Heart disease Mother    Hypertension Mother    Kidney disease Sister    Mental illness Sister        suicide, depresion   Gout Brother    Hypertension Son    Cancer Maternal Grandmother    Cancer Sister    Bone cancer Sister    Cancer Sister    Cancer Sister        kidney   Cancer Daughter    Pancreatic cancer Daughter    Thyroid disease Daughter    Thyroid disease Daughter        thyroid nodules   Hypertension Daughter    Irritable bowel syndrome Daughter    Cancer Daughter  thyroid   Cancer Brother    Prostate cancer Brother    She indicated that her mother is deceased. She indicated that her father is deceased. She indicated that all of her four sisters are deceased. She indicated that only one of her two brothers is alive. She indicated that her maternal grandmother is deceased. She indicated that her maternal grandfather is deceased. She indicated that her paternal grandmother is deceased. She indicated that her paternal grandfather is deceased. She indicated that two of her three daughters are alive. She indicated that her son is alive.  Social History    Social History   Socioeconomic History   Marital status: Widowed    Spouse name: Not on file   Number of children: 4   Years of education: Not on file   Highest education level: Not on file  Occupational History   Not on file  Tobacco Use   Smoking status: Never   Smokeless tobacco: Never  Vaping Use   Vaping status: Never Used  Substance and Sexual Activity   Alcohol use: No   Drug use: No   Sexual activity: Not Currently    Partners: Male    Comment: no dietary restrictions, lives with 2 daughters  Other  Topics Concern   Not on file  Social History Narrative   No dietary restrictions and lives with daughters.   Social Drivers of Corporate investment banker Strain: Low Risk  (01/29/2021)   Overall Financial Resource Strain (CARDIA)    Difficulty of Paying Living Expenses: Not hard at all  Food Insecurity: No Food Insecurity (03/19/2023)   Hunger Vital Sign    Worried About Running Out of Food in the Last Year: Never true    Ran Out of Food in the Last Year: Never true  Transportation Needs: No Transportation Needs (03/19/2023)   PRAPARE - Administrator, Civil Service (Medical): No    Lack of Transportation (Non-Medical): No  Physical Activity: Inactive (01/29/2021)   Exercise Vital Sign    Days of Exercise per Week: 0 days    Minutes of Exercise per Session: 0 min  Stress: No Stress Concern Present (01/29/2021)   [employer] of Occupational Health - Occupational Stress Questionnaire    Feeling of Stress : Not at all  Social Connections: Socially Isolated (01/29/2021)   Social Connection and Isolation Panel [NHANES]    Frequency of Communication with Friends and Family: More than three times a week    Frequency of Social Gatherings with Friends and Family: More than three times a week    Attends Religious Services: Never    Database administrator or Organizations: No    Attends Banker Meetings: Never    Marital Status: Widowed  Intimate Partner Violence: Not At Risk (03/19/2023)   Humiliation, Afraid, Rape, and Kick questionnaire    Fear of Current or Ex-Partner: No    Emotionally Abused: No    Physically Abused: No    Sexually Abused: No     Review of Systems    General:  No chills, fever, night sweats or weight changes.  Cardiovascular:  No chest pain, dyspnea on exertion, edema, orthopnea, palpitations, paroxysmal nocturnal dyspnea. Dermatological: No rash, lesions/masses Respiratory: No cough, dyspnea Urologic: No hematuria,  dysuria Abdominal:   No nausea, vomiting, diarrhea, bright red blood per rectum, melena, or hematemesis Neurologic:  No visual changes, wkns, changes in mental status. All other systems reviewed and are otherwise negative except as  noted above.  Physical Exam    VS:  There were no vitals taken for this visit. , BMI There is no height or weight on file to calculate BMI. GEN: Well nourished, well developed, in no acute distress. HEENT: normal. Neck: Supple, no JVD, carotid bruits, or masses. Cardiac: RRR, no murmurs, rubs, or gallops. No clubbing, cyanosis, edema.  Radials/DP/PT 2+ and equal bilaterally.  Respiratory:  Respirations regular and unlabored, clear to auscultation bilaterally. GI: Soft, nontender, nondistended, BS + x 4. MS: no deformity or atrophy. Skin: warm and dry, no rash. Neuro:  Strength and sensation are intact. Psych: Normal affect.  Accessory Clinical Findings    Recent Labs: 03/18/2023: ALT 16 04/17/2023: BUN 16; Creatinine, Ser 0.82; Hemoglobin 10.4; Magnesium 1.7; Platelets 186; Potassium 4.4; Sodium 136   Recent Lipid Panel    Component Value Date/Time   CHOL 133 03/19/2021 1351   TRIG 56.0 03/19/2021 1351   HDL 72.70 03/19/2021 1351   CHOLHDL 2 03/19/2021 1351   VLDL 11.2 03/19/2021 1351   LDLCALC 49 03/19/2021 1351   LDLCALC 59 03/04/2020 1123    No BP recorded.  {Refresh Note OR Click here to enter BP  :1}***    ECG personally reviewed by me today- ***    Echocardiogram 05/15/2021  IMPRESSIONS     1. Left ventricular ejection fraction, by estimation, is 60 to 65%. Left  ventricular ejection fraction by 2D MOD biplane is 62.7 %. The left  ventricle has normal function. The left ventricle has no regional wall  motion abnormalities. There is moderate  concentric left ventricular hypertrophy. Left ventricular diastolic  parameters are consistent with Grade II diastolic dysfunction  (pseudonormalization). Elevated left ventricular end-diastolic  pressure.   2. Right ventricular systolic function is normal. The right ventricular  size is normal. There is mildly elevated pulmonary artery systolic  pressure. The estimated right ventricular systolic pressure is 44.7 mmHg.   3. Left atrial size was mildly dilated.   4. The mitral valve is degenerative. Trivial mitral valve regurgitation.  No evidence of mitral stenosis.   5. The aortic valve is tricuspid. There is mild calcification of the  aortic valve. There is mild thickening of the aortic valve. Aortic valve  regurgitation is not visualized. Aortic valve sclerosis is present, with  no evidence of aortic valve stenosis.   6. The inferior vena cava is normal in size with greater than 50%  respiratory variability, suggesting right atrial pressure of 3 mmHg.   FINDINGS   Left Ventricle: Left ventricular ejection fraction, by estimation, is 60  to 65%. Left ventricular ejection fraction by 2D MOD biplane is 62.7 %.  The left ventricle has normal function. The left ventricle has no regional  wall motion abnormalities. The  left ventricular internal cavity size was normal in size. There is  moderate concentric left ventricular hypertrophy. Left ventricular  diastolic parameters are consistent with Grade II diastolic dysfunction  (pseudonormalization). Elevated left ventricular   end-diastolic pressure. The E/e' is 22.7.   Right Ventricle: The right ventricular size is normal. No increase in  right ventricular wall thickness. Right ventricular systolic function is  normal. There is mildly elevated pulmonary artery systolic pressure. The  tricuspid regurgitant velocity is 3.23   m/s, and with an assumed right atrial pressure of 3 mmHg, the estimated  right ventricular systolic pressure is 44.7 mmHg.   Left Atrium: Left atrial size was mildly dilated.   Right Atrium: Right atrial size was normal in size.  Pericardium: There is no evidence of pericardial effusion.   Mitral Valve:  The mitral valve is degenerative in appearance. Mild to  moderate mitral annular calcification. Trivial mitral valve regurgitation.  No evidence of mitral valve stenosis.   Tricuspid Valve: The tricuspid valve is grossly normal. Tricuspid valve  regurgitation is mild . No evidence of tricuspid stenosis.   Aortic Valve: The aortic valve is tricuspid. There is mild calcification  of the aortic valve. There is mild thickening of the aortic valve. Aortic  valve regurgitation is not visualized. Aortic valve sclerosis is present,  with no evidence of aortic valve  stenosis. Aortic valve peak gradient measures 7.0 mmHg.   Pulmonic Valve: The pulmonic valve was grossly normal. Pulmonic valve  regurgitation is not visualized. No evidence of pulmonic stenosis.   Aorta: The aortic root and ascending aorta are structurally normal, with  no evidence of dilitation.   Venous: The inferior vena cava is normal in size with greater than 50%  respiratory variability, suggesting right atrial pressure of 3 mmHg.   IAS/Shunts: The atrial septum is grossly normal.        Assessment & Plan   1.  ***   Preoperative cardiac evaluation-cystoscopy, retrograde, lithotripsy, stent exchange, Dr. Di Kindle, urology at [facility], fax #[phone]      Primary Cardiologist: Olga Millers, MD  Chart reviewed as part of pre-operative protocol coverage. Given past medical history and time since last visit, based on ACC/AHA guidelines, Jihanna Fasnacht would be at acceptable risk for the planned procedure without further cardiovascular testing.   Her RCRI is a class her RCRI is moderate risk, 6.6% risk of major cardiac event.  She is able to complete greater than 4 METS of physical activit***y.  Patient was advised that if she*** develops new symptoms prior to surgery to contact our office to arrange a follow-up appointment.  He verbalized understanding.  Her aspirin is not prescribed by  cardiology.  Recommendations for holding aspirin will need to come from prescribing provider.  I will route this recommendation to the requesting party via Epic fax function and remove from pre-op pool.   Disposition: Follow-up with Dr. Jens Som or me in 6 months.   Thomasene Ripple. Wilhelmenia Addis NP-C     06/06/2023, 1:17 PM [facility] [address] Office [phone] Fax [phone]    I spent***minutes examining this patient, reviewing medications, and using patient centered shared decision making involving their cardiac care.   I spent greater than 20 minutes reviewing their past medical history,  medications, and prior cardiac tests.

## 2023-06-08 ENCOUNTER — Ambulatory Visit: Payer: No Typology Code available for payment source | Admitting: General Practice

## 2023-06-08 NOTE — Progress Notes (Unsigned)
Cardiology Office Note    Date:  06/09/2023  ID:  Holly Nunez, DOB 01/21/1925, MRN [id number] PCP:  Default, Provider, MD  Cardiologist:  Olga Millers, MD  Electrophysiologist:  None   Chief Complaint: Preoperative cardiac evaluation   History of Present Illness: .    Holly Nunez is a 89 y.o. female with visit-pertinent history of TIA, hypertension, hyperlipidemia, and patent foramen ovale.  Echocardiogram in November 2014 showed normal LV function, mild to moderate LVH, grade 2 DD, aneurysmal atrial septum with PFO.  Patient with a known history of TIAs.  She was previously on Plavix and aspirin however she had recurrence and is now on a low-dose Xarelto.  In 2016 she had coronary calcification noted on the CT scan.  She underwent nuclear stress test in 02/2015 with normal perfusion.  She was last seen by Dr. Jens Som in 2022 and was doing well at that time.  On 04/17/2023 she was brought to the ER after she was found in her wheelchair to be difficult to arouse.  She was treated for a UTI.  She is now to undergo cystoscopy with lithotripsy and stent placement.  Holly Nunez presents today for preoperative cardiac evaluation for her cystoscopy with stent placement with Dr. Pete Glatter for ureteral stones. She reports that she has been doing well. She denies chest pain or shortness of breath.  Patient's daughter reports that she really no longer walks and is mostly wheelchair-bound.  She has been complaining of ongoing back pain since her recent UTI.  ROS: .   Today she denies chest pain, shortness of breath, lower extremity edema, fatigue, palpitations, melena, hematuria, hemoptysis, diaphoresis, weakness, presyncope, syncope, orthopnea, and PND.  All other systems are reviewed and otherwise negative. Studies Reviewed: Marland Kitchen    EKG:  EKG is ordered today, personally reviewed, demonstrating  EKG Interpretation Date/Time:  Thursday June 09 2023 08:22:45 EST Ventricular Rate:  97 PR  Interval:    QRS Duration:  72 QT Interval:  366 QTC Calculation: 464 R Axis:   94  Text Interpretation: Normal sinus rhythm with with occasional Premature ventricular complexes and occassional PACs Rightward axis No significant change since last tracing Confirmed by Reather Littler [phone]) on 06/09/2023 8:43:54 AM   CV Studies:  Cardiac Studies & Procedures     STRESS TESTS  MYOCARDIAL PERFUSION IMAGING 02/27/2015  Narrative  The left ventricular ejection fraction is hyperdynamic (>65%).  Nuclear stress EF: 75%.  There was no ST segment deviation noted during stress.  The study is normal.  This is a low risk study.  Normal lexiscan nuclear stress test demonstrating normal myocardial perfusion and function: EF 75%.  ECHOCARDIOGRAM  ECHOCARDIOGRAM COMPLETE 05/15/2021  Narrative ECHOCARDIOGRAM REPORT    Patient Name:   Holly Nunez Date of Exam: 05/15/2021 Medical Rec #:  [id number]      Height:       60.0 in Accession #:    [id number]     Weight:       148.0 lb Date of Birth:  1924-06-18     BSA:          1.642 m Patient Age:    89 years       BP:           156/62 mmHg Patient Gender: F              HR:           64 bpm. Exam Location:  Inpatient  Procedure: 2D Echo, Color  Doppler and Cardiac Doppler  Indications:    Abnormal EKG  History:        Patient has no prior history of Echocardiogram examinations. TIA, Arrythmias:Atrial Fibrillation; Risk Factors:Hypertension.  Sonographer:    Cleatis Polka Referring Phys: [id number] ANASTASSIA DOUTOVA  IMPRESSIONS   1. Left ventricular ejection fraction, by estimation, is 60 to 65%. Left ventricular ejection fraction by 2D MOD biplane is 62.7 %. The left ventricle has normal function. The left ventricle has no regional wall motion abnormalities. There is moderate concentric left ventricular hypertrophy. Left ventricular diastolic parameters are consistent with Grade II diastolic dysfunction (pseudonormalization). Elevated left  ventricular end-diastolic pressure. 2. Right ventricular systolic function is normal. The right ventricular size is normal. There is mildly elevated pulmonary artery systolic pressure. The estimated right ventricular systolic pressure is 44.7 mmHg. 3. Left atrial size was mildly dilated. 4. The mitral valve is degenerative. Trivial mitral valve regurgitation. No evidence of mitral stenosis. 5. The aortic valve is tricuspid. There is mild calcification of the aortic valve. There is mild thickening of the aortic valve. Aortic valve regurgitation is not visualized. Aortic valve sclerosis is present, with no evidence of aortic valve stenosis. 6. The inferior vena cava is normal in size with greater than 50% respiratory variability, suggesting right atrial pressure of 3 mmHg.  FINDINGS Left Ventricle: Left ventricular ejection fraction, by estimation, is 60 to 65%. Left ventricular ejection fraction by 2D MOD biplane is 62.7 %. The left ventricle has normal function. The left ventricle has no regional wall motion abnormalities. The left ventricular internal cavity size was normal in size. There is moderate concentric left ventricular hypertrophy. Left ventricular diastolic parameters are consistent with Grade II diastolic dysfunction (pseudonormalization). Elevated left ventricular end-diastolic pressure. The E/e' is 22.7.  Right Ventricle: The right ventricular size is normal. No increase in right ventricular wall thickness. Right ventricular systolic function is normal. There is mildly elevated pulmonary artery systolic pressure. The tricuspid regurgitant velocity is 3.23 m/s, and with an assumed right atrial pressure of 3 mmHg, the estimated right ventricular systolic pressure is 44.7 mmHg.  Left Atrium: Left atrial size was mildly dilated.  Right Atrium: Right atrial size was normal in size.  Pericardium: There is no evidence of pericardial effusion.  Mitral Valve: The mitral valve is  degenerative in appearance. Mild to moderate mitral annular calcification. Trivial mitral valve regurgitation. No evidence of mitral valve stenosis.  Tricuspid Valve: The tricuspid valve is grossly normal. Tricuspid valve regurgitation is mild . No evidence of tricuspid stenosis.  Aortic Valve: The aortic valve is tricuspid. There is mild calcification of the aortic valve. There is mild thickening of the aortic valve. Aortic valve regurgitation is not visualized. Aortic valve sclerosis is present, with no evidence of aortic valve stenosis. Aortic valve peak gradient measures 7.0 mmHg.  Pulmonic Valve: The pulmonic valve was grossly normal. Pulmonic valve regurgitation is not visualized. No evidence of pulmonic stenosis.  Aorta: The aortic root and ascending aorta are structurally normal, with no evidence of dilitation.  Venous: The inferior vena cava is normal in size with greater than 50% respiratory variability, suggesting right atrial pressure of 3 mmHg.  IAS/Shunts: The atrial septum is grossly normal.   LEFT VENTRICLE PLAX 2D                        Biplane EF (MOD) LVIDd:         3.40 cm  LV Biplane EF:   Left LVIDs:         2.20 cm                          ventricular LV PW:         1.10 cm                          ejection LV IVS:        1.50 cm                          fraction by LVOT diam:     1.80 cm                          2D MOD LV SV:         70                               biplane is LV SV Index:   42                               62.7 %. LVOT Area:     2.54 cm Diastology LV e' medial:    4.12 cm/s LV Volumes (MOD)               LV E/e' medial:  21.3 LV vol d, MOD    65.9 ml       LV e' lateral:   3.86 cm/s A2C:                           LV E/e' lateral: 22.7 LV vol d, MOD    68.9 ml A4C: LV vol s, MOD    24.3 ml A2C: LV vol s, MOD    25.6 ml A4C: LV SV MOD A2C:   41.6 ml LV SV MOD A4C:   68.9 ml LV SV MOD BP:    42.5 ml  RIGHT VENTRICLE              IVC RV Basal diam:  3.30 cm     IVC diam: 1.50 cm RV Mid diam:    2.10 cm RV S prime:     12.90 cm/s TAPSE (M-mode): 2.3 cm  LEFT ATRIUM           Index        RIGHT ATRIUM           Index LA diam:      3.60 cm 2.19 cm/m   RA Area:     10.90 cm LA Vol (A2C): 57.2 ml 34.83 ml/m  RA Volume:   23.10 ml  14.06 ml/m LA Vol (A4C): 32.9 ml 20.03 ml/m AORTIC VALVE AV Area (Vmax): 2.10 cm AV Vmax:        132.00 cm/s AV Peak Grad:   7.0 mmHg LVOT Vmax:      109.00 cm/s LVOT Vmean:     87.100 cm/s LVOT VTI:       0.274 m  AORTA Ao Root diam: 2.90 cm Ao Asc diam:  3.30 cm  MITRAL VALVE               TRICUSPID VALVE MV Area (PHT):  3.17 cm    TR Peak grad:   41.7 mmHg MV Decel Time: 239 msec    TR Vmax:        323.00 cm/s MV E velocity: 87.80 cm/s MV A velocity: 93.00 cm/s  SHUNTS MV E/A ratio:  0.94        Systemic VTI:  0.27 m Systemic Diam: 1.80 cm  Lennie Odor MD Electronically signed by Lennie Odor MD Signature Date/Time: 05/15/2021/1:30:58 PM    Final              Current Reported Medications:.    Current Meds  Medication Sig   acetaminophen (TYLENOL) 650 MG CR tablet Take 2 tablets (1,300 mg total) by mouth every 6 (six) hours as needed for pain (for mild pain). (Patient taking differently: Take 1,300 mg by mouth every 8 (eight) hours as needed for pain.)   aspirin EC 81 MG tablet Take 81 mg by mouth daily. Swallow whole.   atorvastatin (LIPITOR) 20 MG tablet Take 20 mg by mouth daily.   COMBIGAN 0.2-0.5 % ophthalmic solution Place 1 drop into both eyes every 12 (twelve) hours.   Dextran 70-Hypromellose 0.1-0.3 % SOLN Apply 2 drops to eye every 12 (twelve) hours as needed (dry, itchy, irritated eyes).   dextromethorphan-guaiFENesin (ROBITUSSIN-DM) 10-100 MG/5ML liquid Take 10 mLs by mouth every 6 (six) hours as needed for cough.   folic acid (FOLVITE) 1 MG tablet Take 1 mg by mouth daily.   latanoprost (XALATAN) 0.005 % ophthalmic solution Place 1 drop into both  eyes at bedtime.   Menthol, Topical Analgesic, (BIOFREEZE COOL THE PAIN) 4 % GEL Apply 1 Application topically 3 (three) times daily.   Multiple Vitamin (MULTIVITAMIN) tablet Take 1 tablet by mouth daily.   Omega-3 Fatty Acids (FISH OIL PO) Take 1 capsule by mouth daily.   ondansetron (ZOFRAN) 4 MG tablet Take 4 mg by mouth every 8 (eight) hours as needed for nausea or vomiting.   polyethylene glycol (MIRALAX / GLYCOLAX) 17 g packet Take 17 g by mouth daily.   Probiotic Product (PROBIOTIC PO) Take 1 tablet by mouth daily.   Trolamine Salicylate (ASPERCREME EX) Apply 1 application  topically in the morning and at bedtime.    Physical Exam:    VS:  BP 108/70   Pulse 69   SpO2 95%    Wt Readings from Last 3 Encounters:  04/17/23 123 lb (55.8 kg)  03/22/23 121 lb 0.5 oz (54.9 kg)  01/29/21 148 lb (67.1 kg)    GEN: Well nourished, well developed in no acute distress NECK: No JVD; No carotid bruits CARDIAC: RRR, no murmurs, rubs, gallops RESPIRATORY:  Clear to auscultation without rales, wheezing or rhonchi  ABDOMEN: Soft, non-tender, non-distended EXTREMITIES:  No edema; No acute deformity   Asessement and Plan:.    PFO/recurrent TIAs: Patient with a known history of TIAs and aneurysmal atrial septum with PFO.  Patient was previously on Xarelto however this was discontinued previously due to increased falls.  Today patient and daughter deny any recurrence.  She denies any chest pain or shortness of breath.  Coronary calcification: Previously noted on CT scan.  She underwent nuclear stress test in 02/2015 with normal perfusion.  Patient with no history of MI, PCI or sternotomy. Today she reports that she overall feels very well, she denies chest pain, shortness of breath, palpitations or lower extremity edema.  Preoperative cardiac evaluation: Holly Nunez needs to undergo cystoscopy with stent placement.  She has a history of  coronary calcification noted on a CT scan but no MI or PCI.  She  has a history of TIAs despite antiplatelet therapy with a present patent foramen ovale, denies any recent recurrence.  She has known grade 2 diastolic dysfunction noted on echo in 2022, however she has not required standing diuretics and has not had recent hospitalizations for heart failure exacerbations.  She appears euvolemic and well compensated on exam.  Discussed with patient and daughter that according to RCRI she has between a 1 to 6.6% risk of Mace.  She is unable to complete 4 METS of activity as she is overall wheelchair-bound. Discussed with Dr. Wyline Mood, DOD at [facility] office today, as Holly Nunez's surgery is needed for kidney stones with recent UTI requiring emergency room evaluation and she is optimized from a cardiac standpoint, would not recommend cardiovascular testing prior to procedure. She may hold aspirin 5-7 days prior to procedure, please resume as soon as safe to do so from a bleeding standpoint. Patient and daughter are aware of cardiovascular risk.     Disposition: F/u with Dr. Jens Som as needed.   Signed, Rip Harbour, NP

## 2023-06-09 ENCOUNTER — Ambulatory Visit: Payer: Medicare Other | Attending: Cardiology | Admitting: Cardiology

## 2023-06-09 ENCOUNTER — Encounter: Payer: Self-pay | Admitting: Cardiology

## 2023-06-09 VITALS — BP 108/70 | HR 69

## 2023-06-09 DIAGNOSIS — Z01810 Encounter for preprocedural cardiovascular examination: Secondary | ICD-10-CM

## 2023-06-09 DIAGNOSIS — Z8673 Personal history of transient ischemic attack (TIA), and cerebral infarction without residual deficits: Secondary | ICD-10-CM

## 2023-06-09 NOTE — Patient Instructions (Signed)
Medication Instructions:  Your physician recommends that you continue on your current medications as directed. Please refer to the Current Medication list given to you today.  *If you need a refill on your cardiac medications before your next appointment, please call your pharmacy*  Lab Work: No labs  Testing/Procedures: No testing  Follow-Up: At [facility], you and your health needs are our priority.  As part of our continuing mission to provide you with exceptional heart care, we have created designated Provider Care Teams.  These Care Teams include your primary Cardiologist (physician) and Advanced Practice Providers (APPs -  Physician Assistants and Nurse Practitioners) who all work together to provide you with the care you need, when you need it.  We recommend signing up for the patient portal called "MyChart".  Sign up information is provided on this After Visit Summary.  MyChart is used to connect with patients for Virtual Visits (Telemedicine).  Patients are able to view lab/test results, encounter notes, upcoming appointments, etc.  Non-urgent messages can be sent to your provider as well.   To learn more about what you can do with MyChart, go to [email or web address].    Your next appointment:   As needed  Provider:   Olga Millers, MD

## 2023-06-23 ENCOUNTER — Ambulatory Visit: Payer: Self-pay | Admitting: Urology

## 2023-06-23 DIAGNOSIS — Z87440 Personal history of urinary (tract) infections: Secondary | ICD-10-CM

## 2023-06-23 DIAGNOSIS — N201 Calculus of ureter: Secondary | ICD-10-CM

## 2023-06-23 NOTE — Progress Notes (Addendum)
 For Anesthesia: PCP - Dr. EMERSON Morin Cardiologist - Pietro Redell RAMAN, MD cardiac clearance note in [facility] 06/09/23 by [facility] NP   Chest x-ray - greater than 1 year EKG - 06/09/23 IN [facility] Stress Test - 02/27/15 IN [facility] ECHO - 05/15/21 in [facility] Cardiac Cath - N/A Pacemaker/ICD device last checked: N/A Pacemaker orders received: N/A Device Rep notified: N/A   Spinal Cord Stimulator:N/A    Sleep Study - N/A  CPAP - N/A    Fasting Blood Sugar - N/A  Checks Blood Sugar ___N/A __ times a day Date and result of last Hgb A1c-N/A    Blood Thinner Instructions: N/A  Aspirin  Instructions: hold aspirin  5-7 days prior to procedure  Last Dose:   Activity level: Wheelchair bound                           Anesthesia review: A. Fib, history of PFO, HTN, TIA   Patient denies shortness of breath, fever, cough and chest pain during pre op phone call.     Patient verbalized understanding of instructions reviewed via telephone.

## 2023-06-23 NOTE — Progress Notes (Signed)
 Sent message, via epic in basket, requesting orders in epic from Careers adviser.

## 2023-06-24 ENCOUNTER — Other Ambulatory Visit: Payer: Self-pay

## 2023-06-24 DIAGNOSIS — Z87440 Personal history of urinary (tract) infections: Secondary | ICD-10-CM

## 2023-06-24 DIAGNOSIS — N390 Urinary tract infection, site not specified: Secondary | ICD-10-CM

## 2023-06-30 ENCOUNTER — Encounter (HOSPITAL_COMMUNITY): Payer: Self-pay | Admitting: Urology

## 2023-06-30 NOTE — Patient Instructions (Addendum)
Preop instructions for: Holly Nunez Date of Birth:  [birth year]                    Date of Procedure: Tuesday, Feb. 25, 2025   Procedure:   CYSTOSCOPY WITH RETROGRADE PYELOGRAM, URETEROSCOPY AND STENT PLACEMENT     Surgeon: Dr. Di Kindle Facility contact:  [name]   Phone:  [phone]               Health Care POA: Contact name/phone#:   Holly Nunez                       and Fax #:    Transportation contact phone#: Holly Nunez [phone] [name]  Please send day of procedure:  Current med list  Medications taken the day of procedure (return attached form to hospital) confirm time of nothing by mouth status (return attached form to hospital) Patient Demographic info( to include DNR status, problem list, allergies) Bring Insurance card and picture ID    [facility] to [facility] at [facility]: 11:45 AM   Report to: Admitting (On your left hand side)    Do not eat solid food or drink past midnight the night before your procedure.(To include any tube feedings-must be discontinued)  *Did Dr. Laverle Hobby office send over orders to collect a urine culture and fax results [phone]?*  Take these morning medications only with sips of water.(or give through gastrostomy or feeding tube).  Atorvastatin May use eye drops  Stop all vitamins and herbal supplements 7 days before surgery.  Aspirin Instructions: hold aspirin 5-7 days prior to procedure   Oral Hygiene is also important to reduce your risk of infection.                                    Remember - BRUSH YOUR TEETH THE MORNING OF SURGERY WITH YOUR REGULAR TOOTHPASTE   DENTURES WILL BE REMOVED PRIOR TO SURGERY PLEASE DO NOT APPLY "Poly grip" OR ADHESIVES!!!  Leave all jewelry and other valuables at place where living( no metal or rings to be worn) No contact lens Women-no make-up, no lotions,perfumes,powders   Any questions day of procedure,call  SHORT STAY-[phone]     Sent  from :[facility]                   Phone:[phone]                   [phone]   Sent by :    Rockwell Alexandria BSN, RN

## 2023-07-01 ENCOUNTER — Other Ambulatory Visit: Payer: Self-pay | Admitting: Urology

## 2023-07-01 ENCOUNTER — Encounter: Payer: Self-pay | Admitting: Urology

## 2023-07-01 MED ORDER — CIPROFLOXACIN HCL 500 MG PO TABS
500.0000 mg | ORAL_TABLET | Freq: Two times a day (BID) | ORAL | 0 refills | Status: DC
Start: 2023-07-01 — End: 2023-07-08

## 2023-07-01 NOTE — Progress Notes (Signed)
06/29/23 Urine Culture results received and placed in chart.

## 2023-07-01 NOTE — Anesthesia Preprocedure Evaluation (Signed)
Anesthesia Evaluation    Airway        Dental   Pulmonary           Cardiovascular hypertension,      Neuro/Psych    GI/Hepatic   Endo/Other    Renal/GU      Musculoskeletal   Abdominal   Peds  Hematology   Anesthesia Other Findings   Reproductive/Obstetrics                             Anesthesia Physical Anesthesia Plan  ASA:   Anesthesia Plan:    Post-op Pain Management:    Induction:   PONV Risk Score and Plan:   Airway Management Planned:   Additional Equipment:   Intra-op Plan:   Post-operative Plan:   Informed Consent:   Plan Discussed with:   Anesthesia Plan Comments: (See PAT note 07/01/2023)       Anesthesia Quick Evaluation

## 2023-07-01 NOTE — Progress Notes (Signed)
Anesthesia Chart Review   Case: [id number] Date/Time: 07/05/23 1245   Procedure: CYSTOSCOPY WITH RETROGRADE PYELOGRAM, URETEROSCOPY AND STENT PLACEMENT (Right)   Anesthesia type: General   Pre-op diagnosis: ureteral stone N20.1   Location: [facility] PROCEDURE [facility] / [facility]   Surgeons: Milderd Meager., MD       DISCUSSION:89 y.o. never smoker with h/o HTN, PAF, CAD, patent foramen ovale, CKD Stage III, ureteral stone scheduled for above procedure 07/05/2023 with Dr. Di Kindle.   Pt seen by cardiology 06/09/2023. Per OV note, "Ms. Fogel needs to undergo cystoscopy with stent placement.  She has a history of coronary calcification noted on a CT scan but no MI or PCI.  She has a history of TIAs despite antiplatelet therapy with a present patent foramen ovale, denies any recent recurrence.  She has known grade 2 diastolic dysfunction noted on echo in 2022, however she has not required standing diuretics and has not had recent hospitalizations for heart failure exacerbations.  She appears euvolemic and well compensated on exam.  Discussed with patient and daughter that according to RCRI she has between a 1 to 6.6% risk of Mace.  She is unable to complete 4 METS of activity as she is overall wheelchair-bound. Discussed with Dr. Wyline Mood, DOD at [facility] office today, as Ms. Lainez's surgery is needed for kidney stones with recent UTI requiring emergency room evaluation and she is optimized from a cardiac standpoint, would not recommend cardiovascular testing prior to procedure. She may hold aspirin 5-7 days prior to procedure, please resume as soon as safe to do so from a bleeding standpoint. Patient and daughter are aware of cardiovascular risk. "   VS: There were no vitals taken for this visit.  PROVIDERS: Default, Provider, MD    LABS: Labs reviewed: Acceptable for surgery. (all labs ordered are listed, but only abnormal results are displayed)  Labs Reviewed - No data to  display   IMAGES:   EKG:   CV: Myocardial Perfusion 02/27/2015 The left ventricular ejection fraction is hyperdynamic (>65%). Nuclear stress EF: 75%. There was no ST segment deviation noted during stress. The study is normal. This is a low risk study.   Normal lexiscan nuclear stress test demonstrating normal myocardial perfusion and function: EF 75%. Past Medical History:  Diagnosis Date   Anemia    Arthritis    CKD (chronic kidney disease), stage III (HCC)    3B   Coronary artery disease    Dysphagia    pureed diet   Estrogen deficiency 05/27/2016   Falls    Glaucoma    Great toe pain, left 12/02/2016   H/O measles    H/O mumps    History of chicken pox    Hyperglycemia 05/27/2016   Hyperlipidemia    Hypertension    OAB (overactive bladder)    PAF (paroxysmal atrial fibrillation) (HCC)    Patent foramen ovale    Preventative health care 02/26/2015   Renal insufficiency    Thrombocytopenia (HCC)    Thyroid nodule    Biopsy-negative   TIA (transient ischemic attack)    Urinary incontinence     Past Surgical History:  Procedure Laterality Date   ABDOMINAL HYSTERECTOMY     fibroid   APPENDECTOMY     CYSTOSCOPY WITH RETROGRADE PYELOGRAM, URETEROSCOPY AND STENT PLACEMENT Right 03/19/2023   Procedure: CYSTO-URETEROSCOPY; RIGHT URETERAL STENT PLACEMENT;  Surgeon: Milderd Meager., MD;  Location: [facility] OR;  Service: Urology;  Laterality: Right;   EYE SURGERY Bilateral  CATARACT REMOVAL   JOINT REPLACEMENT     REPLACEMENT TOTAL KNEE Right 2007   THYROID LOBECTOMY Left 04/25/2015   THYROID LOBECTOMY Left 04/25/2015   Procedure: LEFT THYROID LOBECTOMY;  Surgeon: Darnell Level, MD;  Location: [facility] OR;  Service: General;  Laterality: Left;   TRIGGER FINGER RELEASE  2007    MEDICATIONS: No current facility-administered medications for this encounter.    acetaminophen (TYLENOL) 650 MG CR tablet   aspirin EC 81 MG tablet   atorvastatin (LIPITOR) 20 MG tablet    COMBIGAN 0.2-0.5 % ophthalmic solution   Dextran 70-Hypromellose 0.1-0.3 % SOLN   dextromethorphan-guaiFENesin (ROBITUSSIN-DM) 10-100 MG/5ML liquid   folic acid (FOLVITE) 1 MG tablet   latanoprost (XALATAN) 0.005 % ophthalmic solution   Menthol, Topical Analgesic, (BIOFREEZE COOL THE PAIN) 4 % GEL   Multiple Vitamin (MULTIVITAMIN WITH MINERALS) TABS tablet   nitroGLYCERIN (NITROSTAT) 0.4 MG SL tablet   NON FORMULARY   Omega 3 1000 MG CAPS   ondansetron (ZOFRAN) 4 MG tablet   polyethylene glycol (MIRALAX / GLYCOLAX) 17 g packet   Probiotic Product (PROBIOTIC PO)   Trolamine Salicylate (ASPERCREME EX)   UNABLE TO FIND    Jolanta Cabeza Ward, PA-C [facility] [phone]

## 2023-07-04 ENCOUNTER — Other Ambulatory Visit: Payer: Self-pay | Admitting: Urology

## 2023-07-04 ENCOUNTER — Telehealth: Payer: Self-pay

## 2023-07-04 MED ORDER — CIPROFLOXACIN HCL 500 MG PO TABS: 500.0000 mg | ORAL_TABLET | Freq: Two times a day (BID) | ORAL | 0 refills | Status: AC

## 2023-07-04 NOTE — Telephone Encounter (Signed)
Spoke with daughter who is aware that the surgery will need to be delayed another week to treat patient for a UTI.   Left msg for [place] at [facility] that pt's surgery has to be rescheduled to 07/12/23 due to the need to be treated for a UTI.   Order faxed to [facility] for 3 additional days of Cipro after patient completes her previous 7 day course.

## 2023-07-04 NOTE — Telephone Encounter (Signed)
-----   Message from [employer] sent at 07/03/2023  4:12 PM EST ----- In reviewing the patient's chart and since treatment for her recently diagnosed UTI will not be complete by 2/18,  please cancel her procedure for 2/18 and move to 2/25  This will allow for her UTI to be completely cleared prior to surgery.    Also, she will need an additional 3 days of Cipro so that she will be on antibiotics at the time of her surgery.

## 2023-07-05 DIAGNOSIS — I10 Essential (primary) hypertension: Secondary | ICD-10-CM

## 2023-07-05 DIAGNOSIS — D538 Other specified nutritional anemias: Secondary | ICD-10-CM

## 2023-07-11 NOTE — Progress Notes (Signed)
 Spoke with Durward Mallard (scheduler with [employer]) in regards to pts time change for scheduled surgical procedure on 07/12/23. Pt to arrive at [facility] admitting at 10 am.

## 2023-07-12 ENCOUNTER — Inpatient Hospital Stay (HOSPITAL_COMMUNITY)
Admission: RE | Admit: 2023-07-12 | Discharge: 2023-07-20 | DRG: 683 | Disposition: A | Discharge status: Skilled Nursing Facility | Payer: Medicare Other | Attending: Internal Medicine | Admitting: Internal Medicine

## 2023-07-12 ENCOUNTER — Encounter (HOSPITAL_COMMUNITY): Payer: Self-pay | Admitting: Urology

## 2023-07-12 ENCOUNTER — Encounter (HOSPITAL_COMMUNITY)
Admission: RE | Disposition: A | Discharge status: Skilled Nursing Facility | Payer: Self-pay | Source: Home / Self Care | Attending: Internal Medicine

## 2023-07-12 ENCOUNTER — Ambulatory Visit (HOSPITAL_COMMUNITY): Payer: Medicare Other | Admitting: Physician Assistant

## 2023-07-12 DIAGNOSIS — Z87440 Personal history of urinary (tract) infections: Secondary | ICD-10-CM | POA: Diagnosis not present

## 2023-07-12 DIAGNOSIS — D538 Other specified nutritional anemias: Principal | ICD-10-CM

## 2023-07-12 DIAGNOSIS — Z79899 Other long term (current) drug therapy: Secondary | ICD-10-CM

## 2023-07-12 DIAGNOSIS — L8990 Pressure ulcer of unspecified site, unspecified stage: Secondary | ICD-10-CM | POA: Diagnosis present

## 2023-07-12 DIAGNOSIS — N1831 Chronic kidney disease, stage 3a: Secondary | ICD-10-CM | POA: Diagnosis not present

## 2023-07-12 DIAGNOSIS — I480 Paroxysmal atrial fibrillation: Secondary | ICD-10-CM | POA: Diagnosis present

## 2023-07-12 DIAGNOSIS — M109 Gout, unspecified: Secondary | ICD-10-CM | POA: Diagnosis present

## 2023-07-12 DIAGNOSIS — L89152 Pressure ulcer of sacral region, stage 2: Secondary | ICD-10-CM | POA: Diagnosis present

## 2023-07-12 DIAGNOSIS — D696 Thrombocytopenia, unspecified: Secondary | ICD-10-CM | POA: Diagnosis present

## 2023-07-12 DIAGNOSIS — Z96651 Presence of right artificial knee joint: Secondary | ICD-10-CM | POA: Diagnosis present

## 2023-07-12 DIAGNOSIS — Z993 Dependence on wheelchair: Secondary | ICD-10-CM

## 2023-07-12 DIAGNOSIS — D649 Anemia, unspecified: Secondary | ICD-10-CM | POA: Diagnosis present

## 2023-07-12 DIAGNOSIS — N170 Acute kidney failure with tubular necrosis: Secondary | ICD-10-CM | POA: Diagnosis not present

## 2023-07-12 DIAGNOSIS — E7800 Pure hypercholesterolemia, unspecified: Secondary | ICD-10-CM | POA: Diagnosis not present

## 2023-07-12 DIAGNOSIS — E1165 Type 2 diabetes mellitus with hyperglycemia: Secondary | ICD-10-CM | POA: Diagnosis present

## 2023-07-12 DIAGNOSIS — E785 Hyperlipidemia, unspecified: Secondary | ICD-10-CM | POA: Diagnosis present

## 2023-07-12 DIAGNOSIS — N201 Calculus of ureter: Secondary | ICD-10-CM

## 2023-07-12 DIAGNOSIS — N1832 Chronic kidney disease, stage 3b: Secondary | ICD-10-CM | POA: Diagnosis present

## 2023-07-12 DIAGNOSIS — N202 Calculus of kidney with calculus of ureter: Secondary | ICD-10-CM | POA: Diagnosis present

## 2023-07-12 DIAGNOSIS — Z7982 Long term (current) use of aspirin: Secondary | ICD-10-CM

## 2023-07-12 DIAGNOSIS — Z841 Family history of disorders of kidney and ureter: Secondary | ICD-10-CM | POA: Diagnosis not present

## 2023-07-12 DIAGNOSIS — E872 Acidosis, unspecified: Secondary | ICD-10-CM | POA: Diagnosis present

## 2023-07-12 DIAGNOSIS — Z8673 Personal history of transient ischemic attack (TIA), and cerebral infarction without residual deficits: Secondary | ICD-10-CM | POA: Diagnosis not present

## 2023-07-12 DIAGNOSIS — R54 Age-related physical debility: Secondary | ICD-10-CM | POA: Diagnosis present

## 2023-07-12 DIAGNOSIS — I10 Essential (primary) hypertension: Secondary | ICD-10-CM | POA: Diagnosis present

## 2023-07-12 DIAGNOSIS — I129 Hypertensive chronic kidney disease with stage 1 through stage 4 chronic kidney disease, or unspecified chronic kidney disease: Secondary | ICD-10-CM | POA: Diagnosis present

## 2023-07-12 DIAGNOSIS — N209 Urinary calculus, unspecified: Secondary | ICD-10-CM | POA: Diagnosis present

## 2023-07-12 DIAGNOSIS — N3281 Overactive bladder: Secondary | ICD-10-CM | POA: Diagnosis present

## 2023-07-12 DIAGNOSIS — Z8249 Family history of ischemic heart disease and other diseases of the circulatory system: Secondary | ICD-10-CM

## 2023-07-12 DIAGNOSIS — R739 Hyperglycemia, unspecified: Secondary | ICD-10-CM | POA: Diagnosis present

## 2023-07-12 DIAGNOSIS — I4891 Unspecified atrial fibrillation: Secondary | ICD-10-CM | POA: Diagnosis present

## 2023-07-12 DIAGNOSIS — Z888 Allergy status to other drugs, medicaments and biological substances status: Secondary | ICD-10-CM

## 2023-07-12 DIAGNOSIS — N179 Acute kidney failure, unspecified: Secondary | ICD-10-CM | POA: Insufficient documentation

## 2023-07-12 DIAGNOSIS — E860 Dehydration: Secondary | ICD-10-CM | POA: Diagnosis present

## 2023-07-12 DIAGNOSIS — N218 Other lower urinary tract calculus: Secondary | ICD-10-CM | POA: Diagnosis not present

## 2023-07-12 DIAGNOSIS — L89302 Pressure ulcer of unspecified buttock, stage 2: Secondary | ICD-10-CM | POA: Diagnosis not present

## 2023-07-12 DIAGNOSIS — E1122 Type 2 diabetes mellitus with diabetic chronic kidney disease: Secondary | ICD-10-CM | POA: Diagnosis present

## 2023-07-12 DIAGNOSIS — E871 Hypo-osmolality and hyponatremia: Secondary | ICD-10-CM | POA: Diagnosis present

## 2023-07-12 DIAGNOSIS — N136 Pyonephrosis: Secondary | ICD-10-CM | POA: Diagnosis present

## 2023-07-12 DIAGNOSIS — I2510 Atherosclerotic heart disease of native coronary artery without angina pectoris: Secondary | ICD-10-CM | POA: Diagnosis present

## 2023-07-12 DIAGNOSIS — R4189 Other symptoms and signs involving cognitive functions and awareness: Secondary | ICD-10-CM | POA: Diagnosis present

## 2023-07-12 HISTORY — DX: Thrombocytopenia, unspecified: D69.6

## 2023-07-12 HISTORY — DX: Chronic kidney disease, stage 3 unspecified: N18.30

## 2023-07-12 HISTORY — DX: Anemia, unspecified: D64.9

## 2023-07-12 HISTORY — DX: Unspecified glaucoma: H40.9

## 2023-07-12 HISTORY — DX: Repeated falls: R29.6

## 2023-07-12 HISTORY — DX: Dysphagia, unspecified: R13.10

## 2023-07-12 HISTORY — DX: Overactive bladder: N32.81

## 2023-07-12 HISTORY — DX: Atherosclerotic heart disease of native coronary artery without angina pectoris: I25.10

## 2023-07-12 HISTORY — DX: Paroxysmal atrial fibrillation: I48.0

## 2023-07-12 HISTORY — DX: Urinary calculus, unspecified: N20.9

## 2023-07-12 LAB — CBC
HCT: 36.1 % (ref 36.0–46.0)
Hemoglobin: 11.6 g/dL — ABNORMAL LOW (ref 12.0–15.0)
MCH: 31 pg (ref 26.0–34.0)
MCHC: 32.1 g/dL (ref 30.0–36.0)
MCV: 96.5 fL (ref 80.0–100.0)
Platelets: 196 10*3/uL (ref 150–400)
RBC: 3.74 MIL/uL — ABNORMAL LOW (ref 3.87–5.11)
RDW: 14.4 % (ref 11.5–15.5)
WBC: 6.7 10*3/uL (ref 4.0–10.5)
nRBC: 0 % (ref 0.0–0.2)

## 2023-07-12 LAB — BASIC METABOLIC PANEL
Anion gap: 12 (ref 5–15)
BUN: 53 mg/dL — ABNORMAL HIGH (ref 8–23)
CO2: 20 mmol/L — ABNORMAL LOW (ref 22–32)
Calcium: 8.7 mg/dL — ABNORMAL LOW (ref 8.9–10.3)
Chloride: 101 mmol/L (ref 98–111)
Creatinine, Ser: 5.98 mg/dL — ABNORMAL HIGH (ref 0.44–1.00)
GFR, Estimated: 6 mL/min — ABNORMAL LOW (ref >60)
Glucose, Bld: 89 mg/dL (ref 70–99)
Potassium: 4.5 mmol/L (ref 3.5–5.1)
Sodium: 133 mmol/L — ABNORMAL LOW (ref 135–145)

## 2023-07-12 SURGERY — CYSTOURETEROSCOPY, WITH RETROGRADE PYELOGRAM AND STENT INSERTION
Anesthesia: General | Laterality: Right

## 2023-07-12 MED ORDER — ONDANSETRON HCL 4 MG/2ML IJ SOLN
4.0000 mg | Freq: Four times a day (QID) | INTRAMUSCULAR | Status: DC | PRN
  Administered 2023-07-14 – 2023-07-19 (×4): 4 mg via INTRAVENOUS
  Filled 2023-07-12 (×4): qty 2

## 2023-07-12 MED ORDER — BRIMONIDINE TARTRATE-TIMOLOL 0.2-0.5 % OP SOLN
1.0000 [drp] | Freq: Two times a day (BID) | OPHTHALMIC | Status: DC
Start: 2023-07-12 — End: 2023-07-12

## 2023-07-12 MED ORDER — ACETAMINOPHEN 650 MG RE SUPP: 650.0000 mg | Freq: Four times a day (QID) | RECTAL | Status: DC | PRN

## 2023-07-12 MED ORDER — ONDANSETRON HCL 4 MG/2ML IJ SOLN
INTRAMUSCULAR | Status: AC
  Filled 2023-07-12: qty 2

## 2023-07-12 MED ORDER — ONDANSETRON HCL 4 MG PO TABS
4.0000 mg | ORAL_TABLET | Freq: Four times a day (QID) | ORAL | Status: DC | PRN
  Filled 2023-07-12: qty 1

## 2023-07-12 MED ORDER — MUSCLE RUB 10-15 % EX CREA
1.0000 | TOPICAL_CREAM | Freq: Three times a day (TID) | CUTANEOUS | Status: DC
  Administered 2023-07-12 – 2023-07-20 (×21): 1 via TOPICAL
  Filled 2023-07-12: qty 85

## 2023-07-12 MED ORDER — SODIUM CHLORIDE 0.9 % IV SOLN: INTRAVENOUS | Status: DC

## 2023-07-12 MED ORDER — ACETAMINOPHEN 325 MG PO TABS: 650.0000 mg | ORAL_TABLET | Freq: Four times a day (QID) | ORAL | Status: DC | PRN

## 2023-07-12 MED ORDER — LIDOCAINE HCL (PF) 2 % IJ SOLN
INTRAMUSCULAR | Status: AC
  Filled 2023-07-12: qty 5

## 2023-07-12 MED ORDER — FOLIC ACID 1 MG PO TABS
1.0000 mg | ORAL_TABLET | Freq: Every day | ORAL | Status: DC
  Administered 2023-07-13 – 2023-07-20 (×8): 1 mg via ORAL
  Filled 2023-07-12 (×8): qty 1

## 2023-07-12 MED ORDER — POLYETHYLENE GLYCOL 3350 17 G PO PACK: 17.0000 g | PACK | Freq: Every day | ORAL | Status: DC | PRN

## 2023-07-12 MED ORDER — LACTATED RINGERS IV SOLN: INTRAVENOUS | Status: DC

## 2023-07-12 MED ORDER — PROPOFOL 10 MG/ML IV BOLUS
INTRAVENOUS | Status: AC
  Filled 2023-07-12: qty 20

## 2023-07-12 MED ORDER — BRIMONIDINE TARTRATE 0.2 % OP SOLN
1.0000 [drp] | Freq: Two times a day (BID) | OPHTHALMIC | Status: DC
  Administered 2023-07-12 – 2023-07-20 (×16): 1 [drp] via OPHTHALMIC
  Filled 2023-07-12: qty 5

## 2023-07-12 MED ORDER — DEXAMETHASONE SODIUM PHOSPHATE 10 MG/ML IJ SOLN
INTRAMUSCULAR | Status: AC
  Filled 2023-07-12: qty 1

## 2023-07-12 MED ORDER — FREE WATER
200.0000 mL | Status: DC
  Administered 2023-07-12 – 2023-07-13 (×4): 200 mL via ORAL

## 2023-07-12 MED ORDER — ORAL CARE MOUTH RINSE: 15.0000 mL | Freq: Once | OROMUCOSAL | Status: DC

## 2023-07-12 MED ORDER — NITROGLYCERIN 0.4 MG SL SUBL: 0.4000 mg | SUBLINGUAL_TABLET | SUBLINGUAL | Status: DC | PRN

## 2023-07-12 MED ORDER — CHLORHEXIDINE GLUCONATE 0.12 % MT SOLN: 15.0000 mL | Freq: Once | OROMUCOSAL | Status: DC

## 2023-07-12 MED ORDER — SODIUM CHLORIDE 0.9 % IV SOLN
1.0000 g | INTRAVENOUS | Status: DC
  Filled 2023-07-12: qty 10

## 2023-07-12 MED ORDER — TIMOLOL MALEATE 0.5 % OP SOLN
1.0000 [drp] | Freq: Two times a day (BID) | OPHTHALMIC | Status: DC
  Administered 2023-07-12 – 2023-07-20 (×16): 1 [drp] via OPHTHALMIC
  Filled 2023-07-12: qty 5

## 2023-07-12 MED ORDER — SODIUM CHLORIDE 0.9 % IV BOLUS
1000.0000 mL | Freq: Once | INTRAVENOUS | Status: AC
  Administered 2023-07-12: 1000 mL via INTRAVENOUS

## 2023-07-12 MED ORDER — GUAIFENESIN-DM 100-10 MG/5ML PO SYRP: 10.0000 mL | ORAL_SOLUTION | Freq: Four times a day (QID) | ORAL | Status: DC | PRN

## 2023-07-12 MED ORDER — LATANOPROST 0.005 % OP SOLN
1.0000 [drp] | Freq: Every day | OPHTHALMIC | Status: DC
  Administered 2023-07-12 – 2023-07-19 (×8): 1 [drp] via OPHTHALMIC
  Filled 2023-07-12: qty 2.5

## 2023-07-12 MED ORDER — ASPIRIN 81 MG PO TBEC
81.0000 mg | DELAYED_RELEASE_TABLET | Freq: Every day | ORAL | Status: DC
  Administered 2023-07-13 – 2023-07-20 (×8): 81 mg via ORAL
  Filled 2023-07-12 (×8): qty 1

## 2023-07-12 NOTE — H&P (Addendum)
 Assessment: Right ureteral calculus History of UTI AKI    Plan: Patient scheduled for surgical management of right ureteral calculus with cystoscopy, RIGHT ureteroscopic laser lithotripsy, and exchange of right ureteral stent today. Pre-op labs show significant elevation in creatinine to 5.98 as well as elevated BUN. Given abnormal labs, recommend postponing surgery today. Will request admission to Hospitalist service for further evaluation and management of AKI.  I discussed the abnormal lab findings and recommendations with the patient and her family members.  They understand and agree with the plan.  Chief Complaint:    Chief Complaint  Patient presents with   Nephrolithiasis      HPI: Holly Nunez is a 89 y.o. female who presents for continued evaluation of a right ureteral calculus and UTI. She presented to the hospital on 03/19/2023 with a 9 mm right UPJ stone with associated UTI. Urine and blood cultures grew Proteus. She was treated with IV antibiotics and discharged on Augmentin. She was seen in the office on 04/04/2023 at which time a KUB demonstrated the right ureteral stent in good position with a 7 mm calcification noted at the L3-L4 level alongside the stent. Urine culture from 04/19/2023 showed <10K colonies. She was scheduled for right ureteroscopy with laser lithotripsy.  The procedure was delayed due to the need for cardiology clearance. Urine returned from 07/01/2023 grew >100 K. Proteus.  She was treated with 10 days of Cipro. She completed her antibiotics yesterday.  She is currently at [facility].  She reports no flank pain.  No fevers or chills.  No problems voiding.  No gross hematuria.  She presents now for cystoscopy, right ureteroscopic laser lithotripsy, possible exchange of right ureteral stent.     Portions of the above documentation were copied from a prior visit for review purposes only.   Allergies: Allergies       Allergies  Allergen  Reactions   Alphagan [Brimonidine] Other (See Comments)      Secondary infection and vision changes        PMH:     Past Medical History:  Diagnosis Date   Arthritis     Estrogen deficiency 05/27/2016   Great toe pain, left 12/02/2016   H/O measles     H/O mumps     History of chicken pox     Hyperglycemia 05/27/2016   Hyperlipidemia     Hypertension     Patent foramen ovale     Preventative health care 02/26/2015   Renal insufficiency     Thyroid nodule      Biopsy-negative   TIA (transient ischemic attack)     Urinary incontinence            PSH:      Past Surgical History:  Procedure Laterality Date   ABDOMINAL HYSTERECTOMY        fibroid   APPENDECTOMY       CYSTOSCOPY WITH RETROGRADE PYELOGRAM, URETEROSCOPY AND STENT PLACEMENT Right 03/19/2023    Procedure: CYSTO-URETEROSCOPY; RIGHT URETERAL STENT PLACEMENT;  Surgeon: Milderd Meager., MD;  Location: [facility] OR;  Service: Urology;  Laterality: Right;   EYE SURGERY Bilateral      CATARACT REMOVAL   JOINT REPLACEMENT       REPLACEMENT TOTAL KNEE Right 2007   THYROID LOBECTOMY Left 04/25/2015   THYROID LOBECTOMY Left 04/25/2015    Procedure: LEFT THYROID LOBECTOMY;  Surgeon: Darnell Level, MD;  [facility];  Service: General;  Laterality: Left;   TRIGGER FINGER RELEASE  2007          SH: Social History  Social History        Tobacco Use   Smoking status: Never   Smokeless tobacco: Never  Vaping Use   Vaping status: Never Used  Substance Use Topics   Alcohol use: No   Drug use: No        ROS: Constitutional:  Negative for fever, chills, weight loss CV: Negative for chest pain, previous MI, hypertension Respiratory:  Negative for shortness of breath, wheezing, sleep apnea, frequent cough GI:  Negative for nausea, vomiting, bloody stool, GERD   PE: GENERAL APPEARANCE:  Well appearing, well developed, well nourished, NAD HEENT:  Atraumatic, normocephalic, oropharynx clear NECK:  Supple   ABDOMEN:  Soft, non-tender, no masses EXTREMITIES:  Moves all extremities well, without clubbing, cyanosis, or edema NEUROLOGIC:  Alert and oriented x 3, CN II-XII grossly intact MENTAL STATUS:  appropriate BACK:  Non-tender to palpation, No CVAT SKIN:  Warm, dry, and intact     Results: Results for orders placed or performed during the hospital encounter of 07/12/23 (from the past 24 hours)  Basic metabolic panel per protocol     Status: Abnormal   Collection Time: 07/12/23 11:40 AM  Result Value Ref Range   Sodium 133 (L) 135 - 145 mmol/L   Potassium 4.5 3.5 - 5.1 mmol/L   Chloride 101 98 - 111 mmol/L   CO2 20 (L) 22 - 32 mmol/L   Glucose, Bld 89 70 - 99 mg/dL   BUN 53 (H) 8 - 23 mg/dL   Creatinine, Ser 1.61 (H) 0.44 - 1.00 mg/dL   Calcium 8.7 (L) 8.9 - 10.3 mg/dL   GFR, Estimated 6 (L) >60 mL/min   Anion gap 12 5 - 15  CBC per protocol     Status: Abnormal   Collection Time: 07/12/23 11:40 AM  Result Value Ref Range   WBC 6.7 4.0 - 10.5 K/uL   RBC 3.74 (L) 3.87 - 5.11 MIL/uL   Hemoglobin 11.6 (L) 12.0 - 15.0 g/dL   HCT 09.6 04.5 - 40.9 %   MCV 96.5 80.0 - 100.0 fL   MCH 31.0 26.0 - 34.0 pg   MCHC 32.1 30.0 - 36.0 g/dL   RDW 81.1 91.4 - 78.2 %   Platelets 196 150 - 400 K/uL   nRBC 0.0 0.0 - 0.2 %

## 2023-07-12 NOTE — Progress Notes (Signed)
 Patients procedure is cancelled today due to patients Creatinine Level. Per Dr Pete Glatter.

## 2023-07-12 NOTE — Plan of Care (Signed)
   Problem: Education: Goal: Knowledge of General Education information will improve Description: Including pain rating scale, medication(s)/side effects and non-pharmacologic comfort measures Outcome: Progressing   Problem: Nutrition: Goal: Adequate nutrition will be maintained Outcome: Progressing   Problem: Coping: Goal: Level of anxiety will decrease Outcome: Progressing

## 2023-07-12 NOTE — OR Nursing (Signed)
 Surgery cancelled in OR per MD and anesthesiologist.

## 2023-07-12 NOTE — H&P (Signed)
 History and Physical    Patient: Holly Nunez ZOX:[id number] DOB: [birth year] DOA: 07/12/2023 DOS: the patient was seen and examined on 07/12/2023 PCP: Default, Provider, MD  Patient coming from: Home  Chief Complaint: ARF.  HPI: Holly Nunez is a 89 y.o. female with medical history significant of anemia, osteoarthritis stage IIIb CKD, CAD, dysphagia, estrogen deficiency, falls, glaucoma, measles, mumps, varicella-zoster, hyperglycemia, hyperlipidemia, hypertension, overactive bladder, paroxysmal atrial fibrillation, patent foramen ovale, thrombocytopenia, benign thyroid nodule, TIA, urinary incontinence, cholelithiasis who was going to have lithotripsy today with Dr. Pete Glatter, but unfortunately her lab work showed AKI.  The patient is unable to much through the medical history.  She is able to answer simple questions.  No headache, abdominal, back or chest pain at this moment.  No dyspnea.  Per patient's daughters, she is than [place] and she usually requires assistance with oral intake.  Lab work: CBC showed white count 6.7, hemoglobin 11.6 g/dL platelets 811.  BMP showed a sodium 133, potassium 4.5, chloride 101 and CO2 20 mmol/L.  Glucose 89, BUN 53, creatinine 5.98 and calcium 8.7 mg/deciliter.   Review of Systems: As mentioned in the history of present illness. All other systems reviewed and are negative. Past Medical History:  Diagnosis Date   Anemia    Arthritis    CKD (chronic kidney disease), stage III (HCC)    3B   Coronary artery disease    Dysphagia    pureed diet   Estrogen deficiency 05/27/2016   Falls    Glaucoma    Great toe pain, left 12/02/2016   H/O measles    H/O mumps    History of chicken pox    Hyperglycemia 05/27/2016   Hyperlipidemia    Hypertension    OAB (overactive bladder)    PAF (paroxysmal atrial fibrillation) (HCC)    Patent foramen ovale    Preventative health care 02/26/2015   Renal insufficiency    Thrombocytopenia (HCC)    Thyroid  nodule    Biopsy-negative   TIA (transient ischemic attack)    Urinary incontinence    Past Surgical History:  Procedure Laterality Date   ABDOMINAL HYSTERECTOMY     fibroid   APPENDECTOMY     CYSTOSCOPY WITH RETROGRADE PYELOGRAM, URETEROSCOPY AND STENT PLACEMENT Right 03/19/2023   Procedure: CYSTO-URETEROSCOPY; RIGHT URETERAL STENT PLACEMENT;  Surgeon: Milderd Meager., MD;  Location: [facility] OR;  Service: Urology;  Laterality: Right;   EYE SURGERY Bilateral    CATARACT REMOVAL   JOINT REPLACEMENT     REPLACEMENT TOTAL KNEE Right 2007   THYROID LOBECTOMY Left 04/25/2015   THYROID LOBECTOMY Left 04/25/2015   Procedure: LEFT THYROID LOBECTOMY;  Surgeon: Darnell Level, MD;  Location: [facility] OR;  Service: General;  Laterality: Left;   TRIGGER FINGER RELEASE  2007   Social History:  reports that she has never smoked. She has never used smokeless tobacco. She reports that she does not drink alcohol and does not use drugs.  Allergies  Allergen Reactions   Alphagan [Brimonidine] Other (See Comments)    Secondary infection and vision changes. Allergy not listed on MAR     Family History  Problem Relation Age of Onset   Heart disease Mother    Hypertension Mother    Kidney disease Sister    Mental illness Sister        suicide, depresion   Gout Brother    Hypertension Son    Cancer Maternal Grandmother    Cancer Sister    Bone cancer Sister  Cancer Sister    Cancer Sister        kidney   Cancer Daughter    Pancreatic cancer Daughter    Thyroid disease Daughter    Thyroid disease Daughter        thyroid nodules   Hypertension Daughter    Irritable bowel syndrome Daughter    Cancer Daughter        thyroid   Cancer Brother    Prostate cancer Brother     Prior to Admission medications   Medication Sig Start Date End Date Taking? Authorizing Provider  acetaminophen (TYLENOL) 650 MG CR tablet Take 2 tablets (1,300 mg total) by mouth every 6 (six) hours as needed for pain (for  mild pain). Patient taking differently: Take 1,300 mg by mouth every 8 (eight) hours as needed for pain. 05/15/21  Yes Calvert Cantor, MD  aspirin EC 81 MG tablet Take 81 mg by mouth in the morning.   Yes [provider]  atorvastatin (LIPITOR) 20 MG tablet Take 20 mg by mouth in the morning.   Yes [provider]  COMBIGAN 0.2-0.5 % ophthalmic solution Place 1 drop into both eyes in the morning and at bedtime. 03/14/23  Yes [provider]  Dextran 70-Hypromellose 0.1-0.3 % SOLN Apply 2 drops to eye every 12 (twelve) hours as needed (dry, itchy, irritated eyes).   Yes [provider]  dextromethorphan-guaiFENesin (ROBITUSSIN-DM) 10-100 MG/5ML liquid Take 10 mLs by mouth every 6 (six) hours as needed for cough.   Yes [provider]  folic acid (FOLVITE) 1 MG tablet Take 1 mg by mouth in the morning.   Yes [provider]  latanoprost (XALATAN) 0.005 % ophthalmic solution Place 1 drop into both eyes at bedtime. 03/10/23  Yes [provider]  Menthol, Topical Analgesic, (BIOFREEZE COOL THE PAIN) 4 % GEL Apply 1 Application topically 3 (three) times daily.   Yes [provider]  Multiple Vitamin (MULTIVITAMIN WITH MINERALS) TABS tablet Take 1 tablet by mouth in the morning.   Yes [provider]  NON FORMULARY Take 120 mLs by mouth in the morning and at bedtime. Med pass   Yes [provider]  Omega 3 1000 MG CAPS Take 1,000 mg by mouth daily.   Yes [provider]  polyethylene glycol (MIRALAX / GLYCOLAX) 17 g packet Take 17 g by mouth in the morning.   Yes [provider]  Probiotic Product (PROBIOTIC PO) Take 1 capsule by mouth daily.   Yes [provider]  Trolamine Salicylate (ASPERCREME EX) Apply 1 application  topically in the morning and at bedtime. Apply to both legs   Yes [provider]  UNABLE TO FIND Take 120 mLs by mouth in the morning, at noon, and at bedtime. Med  Name: Mighty Shake   Yes [provider]  nitroGLYCERIN (NITROSTAT) 0.4 MG SL tablet Place 1 tablet (0.4 mg total) under the tongue every 5 (five) minutes as needed for chest pain. 10/01/20 07/01/27  Lewayne Bunting, MD  ondansetron (ZOFRAN) 4 MG tablet Take 4 mg by mouth every 8 (eight) hours as needed for nausea or vomiting.    [provider]    Physical Exam: Vitals:   07/01/23 1403 07/12/23 1055 07/12/23 1108  BP:  (!) 140/60   Pulse:  (!) 55   Resp:  14   Temp:  98.3 F (36.8 C)   TempSrc:  Oral   SpO2:  100%   Weight: 48.1 kg  48.1 kg  Height: 5' (1.524 m)  5' (1.524 m)   Physical Exam Vitals and nursing note reviewed.  Constitutional:      General: She is awake. She is not in acute distress.    Appearance: Normal appearance. She is ill-appearing.  HENT:     Head: Normocephalic.     Nose: No rhinorrhea.     Mouth/Throat:     Mouth: Mucous membranes are dry.  Eyes:     General: No scleral icterus.    Pupils: Pupils are equal, round, and reactive to light.  Neck:     Vascular: No JVD.  Cardiovascular:     Rate and Rhythm: Normal rate and regular rhythm.     Heart sounds: S1 normal and S2 normal.  Pulmonary:     Effort: Pulmonary effort is normal. No respiratory distress.     Breath sounds: Normal breath sounds. No wheezing or rales.  Abdominal:     General: Bowel sounds are normal.     Palpations: Abdomen is soft.  Musculoskeletal:     Cervical back: Neck supple.     Right lower leg: No edema.     Left lower leg: No edema.  Skin:    General: Skin is warm and dry.  Neurological:     Mental Status: She is alert. Mental status is at baseline.  Psychiatric:        Mood and Affect: Mood normal.        Behavior: Behavior normal. Behavior is cooperative.    Data Reviewed:  Results are pending, will review when available. 05/15/2021 transthoracic echocardiogram IMPRESSIONS:   1. Left ventricular ejection fraction, by estimation, is 60 to  65%. Left  ventricular ejection fraction by 2D MOD biplane is 62.7 %. The left  ventricle has normal function. The left ventricle has no regional wall  motion abnormalities. There is moderate  concentric left ventricular hypertrophy. Left ventricular diastolic  parameters are consistent with Grade II diastolic dysfunction  (pseudonormalization). Elevated left ventricular end-diastolic pressure.   2. Right ventricular systolic function is normal. The right ventricular  size is normal. There is mildly elevated pulmonary artery systolic  pressure. The estimated right ventricular systolic pressure is 44.7 mmHg.   3. Left atrial size was mildly dilated.   4. The mitral valve is degenerative. Trivial mitral valve regurgitation.  No evidence of mitral stenosis.   5. The aortic valve is tricuspid. There is mild calcification of the  aortic valve. There is mild thickening of the aortic valve. Aortic valve  regurgitation is not visualized. Aortic valve sclerosis is present, with  no evidence of aortic valve stenosis.   6. The inferior vena cava is normal in size with greater than 50%  respiratory variability, suggesting right atrial pressure of 3 mmHg.   Assessment and Plan: Principal Problem:   AKI (acute kidney injury) (HCC) Superimposed on:   CKD (chronic kidney disease), stage 3a (HCC) And:   Urolithiasis Inpatient/telemetry. Continue IV fluids. Avoid hypotension. Avoid nephrotoxins. Monitor intake and output. Monitor renal function electrolytes. Awaiting for urinalysis. Ceftriaxone 1 g ordered by urology. -Lithotripsy procedure held for the moment.  Active Problems:   Hypocalcemia Recheck calcium with albumin level in AM. Further workup depending on results.    Hyponatremia Poor oral intake recently. Encourage fluid intake. Continue IV fluids.    HTN (hypertension) Normotensive at the moment. Hold antihypertensives for now. May use as needed oral/IV hydralazine.     Hyperlipidemia Check total CK first before resuming statin.  Normocytic anemia Monitor hematocrit and hemoglobin. Transfuse as needed.    Hyperglycemia Check fasting glucose in AM. Further workup depending on results.    Unspecified atrial fibrillation (HCC) CHA?DS?-VASc Score of at least 8. -All risk factors, but diabetes. Not on anticoagulation or rate control meds.    Cognitive deficits History of TIA. Supportive care. Continue with daily aspirin.   Advance Care Planning:   Code Status: Full Code   Consults:   Family Communication: Her 2 daughters were at bedside.  Severity of Illness: The appropriate patient status for this patient is INPATIENT. Inpatient status is judged to be reasonable and necessary in order to provide the required intensity of service to ensure the patient's safety. The patient's presenting symptoms, physical exam findings, and initial radiographic and laboratory data in the context of their chronic comorbidities is felt to place them at high risk for further clinical deterioration. Furthermore, it is not anticipated that the patient will be medically stable for discharge from the hospital within 2 midnights of admission.   * I certify that at the point of admission it is my clinical judgment that the patient will require inpatient hospital care spanning beyond 2 midnights from the point of admission due to high intensity of service, high risk for further deterioration and high frequency of surveillance required.*  Author: Bobette Mo, MD 07/12/2023 2:21 PM  For on call review [email or web address].   This document was prepared using Dragon voice recognition software and may contain some unintended transcription errors.

## 2023-07-13 ENCOUNTER — Inpatient Hospital Stay (HOSPITAL_COMMUNITY): Payer: Medicare Other

## 2023-07-13 ENCOUNTER — Encounter: Payer: Medicare Other | Admitting: Urology

## 2023-07-13 ENCOUNTER — Other Ambulatory Visit: Payer: Self-pay

## 2023-07-13 DIAGNOSIS — N179 Acute kidney failure, unspecified: Secondary | ICD-10-CM | POA: Diagnosis not present

## 2023-07-13 LAB — COMPREHENSIVE METABOLIC PANEL
ALT: 12 U/L (ref 0–44)
AST: 22 U/L (ref 15–41)
Albumin: 2.1 g/dL — ABNORMAL LOW (ref 3.5–5.0)
Alkaline Phosphatase: 48 U/L (ref 38–126)
Anion gap: 9 (ref 5–15)
BUN: 49 mg/dL — ABNORMAL HIGH (ref 8–23)
CO2: 19 mmol/L — ABNORMAL LOW (ref 22–32)
Calcium: 7.6 mg/dL — ABNORMAL LOW (ref 8.9–10.3)
Chloride: 108 mmol/L (ref 98–111)
Creatinine, Ser: 5.29 mg/dL — ABNORMAL HIGH (ref 0.44–1.00)
GFR, Estimated: 7 mL/min — ABNORMAL LOW (ref >60)
Glucose, Bld: 93 mg/dL (ref 70–99)
Potassium: 4.3 mmol/L (ref 3.5–5.1)
Sodium: 136 mmol/L (ref 135–145)
Total Bilirubin: 0.6 mg/dL (ref 0.0–1.2)
Total Protein: 5 g/dL — ABNORMAL LOW (ref 6.5–8.1)

## 2023-07-13 LAB — CBC
HCT: 33.2 % — ABNORMAL LOW (ref 36.0–46.0)
Hemoglobin: 10.4 g/dL — ABNORMAL LOW (ref 12.0–15.0)
MCH: 30.2 pg (ref 26.0–34.0)
MCHC: 31.3 g/dL (ref 30.0–36.0)
MCV: 96.5 fL (ref 80.0–100.0)
Platelets: 177 10*3/uL (ref 150–400)
RBC: 3.44 MIL/uL — ABNORMAL LOW (ref 3.87–5.11)
RDW: 14.5 % (ref 11.5–15.5)
WBC: 5 10*3/uL (ref 4.0–10.5)
nRBC: 0 % (ref 0.0–0.2)

## 2023-07-13 LAB — URINALYSIS, ROUTINE W REFLEX MICROSCOPIC
Bilirubin Urine: NEGATIVE
Glucose, UA: NEGATIVE mg/dL
Ketones, ur: NEGATIVE mg/dL
Nitrite: NEGATIVE
Protein, ur: 30 mg/dL — AB
Specific Gravity, Urine: 1.01 (ref 1.005–1.030)
WBC, UA: >50 WBC/hpf (ref 0–5)
pH: 6 (ref 5.0–8.0)

## 2023-07-13 LAB — CREATININE, URINE, RANDOM: Creatinine, Urine: 60 mg/dL

## 2023-07-13 LAB — SODIUM, URINE, RANDOM: Sodium, Ur: 62 mmol/L

## 2023-07-13 LAB — CK: Total CK: 43 U/L (ref 38–234)

## 2023-07-13 MED ORDER — SODIUM CHLORIDE 0.9 % IV SOLN: INTRAVENOUS | Status: DC

## 2023-07-13 MED ORDER — POLYETHYLENE GLYCOL 3350 17 G PO PACK
17.0000 g | PACK | Freq: Every day | ORAL | Status: DC
  Administered 2023-07-13 – 2023-07-19 (×7): 17 g via ORAL
  Filled 2023-07-13 (×8): qty 1

## 2023-07-13 MED ORDER — SODIUM BICARBONATE 650 MG PO TABS
650.0000 mg | ORAL_TABLET | Freq: Three times a day (TID) | ORAL | Status: DC
  Administered 2023-07-13 – 2023-07-16 (×10): 650 mg via ORAL
  Filled 2023-07-13 (×10): qty 1

## 2023-07-13 MED ORDER — CHLORHEXIDINE GLUCONATE CLOTH 2 % EX PADS
6.0000 | MEDICATED_PAD | Freq: Every day | CUTANEOUS | Status: AC
Start: 2023-07-14 — End: ?
  Administered 2023-07-14 – 2023-07-18 (×5): 6 via TOPICAL

## 2023-07-13 MED ORDER — SENNOSIDES-DOCUSATE SODIUM 8.6-50 MG PO TABS
1.0000 | ORAL_TABLET | Freq: Two times a day (BID) | ORAL | Status: DC
  Administered 2023-07-13 – 2023-07-20 (×15): 1 via ORAL
  Filled 2023-07-13 (×15): qty 1

## 2023-07-13 NOTE — TOC Initial Note (Signed)
 Transition of Care [facility]) - Initial/Assessment Note    Patient Details  Name: Holly Nunez MRN: [id number] Date of Birth: [birth year]  [facility] CM/SW Contact:    Larrie Kass, LCSW Phone Number: 07/13/2023, 12:52 PM  Clinical Narrative:                 Pt is a LTC resident with [facility]. CSW spoke to [facility], with admissions, pt can return upon d/c . TOC to follow.   Expected Discharge Plan: Long Term Nursing Home Barriers to Discharge: Continued Medical Work up   Patient Goals and CMS Choice            Expected Discharge Plan and Services                                              Prior Living Arrangements/Services                       Activities of Daily Living   ADL Screening (condition at time of admission) Independently performs ADLs?: No Does the patient have a NEW difficulty with bathing/dressing/toileting/self-feeding that is expected to last >3 days?: No Does the patient have a NEW difficulty with getting in/out of bed, walking, or climbing stairs that is expected to last >3 days?: No Does the patient have a NEW difficulty with communication that is expected to last >3 days?: No Is the patient deaf or have difficulty hearing?: Yes Does the patient have difficulty seeing, even when wearing glasses/contacts?: No Does the patient have difficulty concentrating, remembering, or making decisions?: No  Permission Sought/Granted                  Emotional Assessment              Admission diagnosis:  AKI (acute kidney injury) (HCC) [N17.9] Patient Active Problem List   Diagnosis Date Noted   AKI (acute kidney injury) (HCC) 07/12/2023   Hypocalcemia 07/12/2023   Hyponatremia 07/12/2023   Urolithiasis 07/12/2023   History of UTI 04/04/2023   Ureteral calculus 03/19/2023   Acute renal failure superimposed on stage 3a chronic kidney disease (HCC) 03/19/2023   Acute UTI 03/19/2023   Pyuria 05/18/2021    Cognitive deficits 05/18/2021   Pancreatic mass 05/18/2021   Abnormal ECG 05/15/2021   Fall 05/14/2021   Psoas hematoma, left, secondary to anticoagulant therapy 05/14/2021   CKD stage 3a, GFR 45-59 ml/min (HCC) 05/14/2021   Unspecified atrial fibrillation (HCC) 05/14/2021   Pancreatic pseudocyst/cyst 05/14/2021   History of TIA (transient ischemic attack) 04/16/2020   Anticoagulation adequate 04/16/2020   Dyspepsia 08/21/2019   Trauma 09/23/2017   Debility 06/21/2017   Great toe pain, left 12/02/2016   Vitamin D deficiency 12/02/2016   Hyperglycemia 05/27/2016   Estrogen deficiency 05/27/2016   Overactive bladder 12/07/2015   Allergy 12/07/2015   Multinodular goiter (nontoxic) 04/25/2015   Preventative health care 02/26/2015   History of chicken pox    Renal insufficiency 03/07/2014   HTN (hypertension) 02/19/2014   Hyperlipidemia 02/19/2014   DJD (degenerative joint disease) 02/19/2014   S/P hysterectomy 02/19/2014   Normocytic anemia 02/19/2014   Urinary incontinence 02/19/2014   Syncope 04/29/2013   PCP:  Default, Provider, MD Pharmacy:  No Pharmacies Listed    Social Drivers of Health (SDOH) Social History: SDOH Screenings   Food  Insecurity: No Food Insecurity (07/12/2023)  Housing: Patient Declined (07/13/2023)  Transportation Needs: No Transportation Needs (07/12/2023)  Utilities: Patient Declined (07/13/2023)  Alcohol Screen: Low Risk  (01/29/2021)  Depression (PHQ2-9): Low Risk  (01/29/2021)  Financial Resource Strain: Low Risk  (01/29/2021)  Physical Activity: Inactive (01/29/2021)  Social Connections: Patient Declined (07/12/2023)  Stress: No Stress Concern Present (01/29/2021)  Tobacco Use: Low Risk  (07/12/2023)   SDOH Interventions:     Readmission Risk Interventions     No data to display

## 2023-07-13 NOTE — Consult Note (Addendum)
 [facility] Renal Consultation Note  Requesting MD: Burnadette Pop, MD Indication for Consultation:  AKI  Chief complaint:   HPI:  Holly Nunez is a 89 y.o. female with a history of coronary artery disease, hypertension, atrial fibrillation, CKD stage 3, and urinary incontinence who presented to the hospital for a planned lithotripsy with Dr. Pete Glatter.  Her labs demonstrated AKI and the procedure was deferred.  Nephrology is consulted for assistance with management of AKI. Her creatinine has improved slightly from 5.98 on presentation to 5.29.  baseline Cr is variable but most often between 1 - 1.4.  She has been started on normal saline and has been on NS at 100 ml/hr on my exam.  Urology has followed her case as well.  She has a non-obstructing stone and a right ureteral stent with interval dilution of right hydroureteronephrosis.  No obstructive right nephroureterolithiasis.  I spoke with her daughter and granddaughter at bedside.  The patient has been a little more confused recently.  Her daughter thinks that she may have had a UTI and they didn't know it.  Ceftriaxone was ordered on 2/25 but not given.  She wakens with my exam and after a couple of questions says "let me go back to sleep."  Her family laughs and thinks she is getting back to herself.    Creatinine, Ser  Date/Time Value Ref Range Status  07/13/2023 04:21 AM 5.29 (H) 0.44 - 1.00 mg/dL Final  16/02/9603 54:09 AM 5.98 (H) 0.44 - 1.00 mg/dL Final  81/19/1478 29:56 PM 0.82 0.44 - 1.00 mg/dL Final  21/30/8657 84:69 AM 1.16 (H) 0.44 - 1.00 mg/dL Final  62/95/2841 32:44 AM 1.29 (H) 0.44 - 1.00 mg/dL Final  05/19/7251 66:44 PM 1.63 (H) 0.44 - 1.00 mg/dL Final  03/47/4259 56:38 PM 0.97 0.44 - 1.00 mg/dL Final  75/64/3329 51:88 AM 1.08 (H) 0.44 - 1.00 mg/dL Final  41/66/0630 16:01 AM 0.98 0.44 - 1.00 mg/dL Final  09/32/3557 32:20 AM 0.96 0.44 - 1.00 mg/dL Final  25/42/7062 37:62 PM 1.05 (H) 0.44 - 1.00 mg/dL Final   83/15/1761 60:73 PM 1.44 (H) 0.40 - 1.20 mg/dL Final  71/10/2692 85:46 PM 1.22 (H) 0.40 - 1.20 mg/dL Final  27/07/5007 38:18 PM 1.43 (H) 0.40 - 1.20 mg/dL Final  29/93/7169 67:89 AM 1.42 (H) 0.40 - 1.20 mg/dL Final  38/02/1750 02:58 PM 1.56 (H) 0.44 - 1.00 mg/dL Final  52/77/8242 35:36 PM 1.26 (H) 0.40 - 1.20 mg/dL Final  14/43/1540 08:67 PM 1.40 (H) 0.40 - 1.20 mg/dL Final  61/95/0932 67:12 PM 1.15 0.40 - 1.20 mg/dL Final  45/80/9983 38:25 PM 1.33 (H) 0.40 - 1.20 mg/dL Final  05/39/7673 41:93 AM 1.27 (H) 0.40 - 1.20 mg/dL Final  79/06/4095 35:32 AM 1.20 0.40 - 1.20 mg/dL Final  99/24/2683 41:96 AM 1.21 (H) 0.40 - 1.20 mg/dL Final  22/29/7989 21:19 PM 1.19 0.40 - 1.20 mg/dL Final  41/74/0814 48:18 AM 1.28 (H) 0.44 - 1.00 mg/dL Final  56/31/4970 26:37 PM 1.25 (H) 0.40 - 1.20 mg/dL Final     PMHx:   Past Medical History:  Diagnosis Date   Anemia    Arthritis    CKD (chronic kidney disease), stage III (HCC)    3B   Coronary artery disease    Dysphagia    pureed diet   Estrogen deficiency 05/27/2016   Falls    Glaucoma    Great toe pain, left 12/02/2016   H/O measles    H/O mumps    History of  chicken pox    Hyperglycemia 05/27/2016   Hyperlipidemia    Hypertension    OAB (overactive bladder)    PAF (paroxysmal atrial fibrillation) (HCC)    Patent foramen ovale    Preventative health care 02/26/2015   Renal insufficiency    Thrombocytopenia (HCC)    Thyroid nodule    Biopsy-negative   TIA (transient ischemic attack)    Urinary incontinence     Past Surgical History:  Procedure Laterality Date   ABDOMINAL HYSTERECTOMY     fibroid   APPENDECTOMY     CYSTOSCOPY WITH RETROGRADE PYELOGRAM, URETEROSCOPY AND STENT PLACEMENT Right 03/19/2023   Procedure: CYSTO-URETEROSCOPY; RIGHT URETERAL STENT PLACEMENT;  Surgeon: Milderd Meager., MD;  Location: [facility] OR;  Service: Urology;  Laterality: Right;   EYE SURGERY Bilateral    CATARACT REMOVAL   JOINT REPLACEMENT      REPLACEMENT TOTAL KNEE Right 2007   THYROID LOBECTOMY Left 04/25/2015   THYROID LOBECTOMY Left 04/25/2015   Procedure: LEFT THYROID LOBECTOMY;  Surgeon: Darnell Level, MD;  Location: [facility] OR;  Service: General;  Laterality: Left;   TRIGGER FINGER RELEASE  2007    Family Hx:  Family History  Problem Relation Age of Onset   Heart disease Mother    Hypertension Mother    Kidney disease Sister    Mental illness Sister        suicide, depresion   Gout Brother    Hypertension Son    Cancer Maternal Grandmother    Cancer Sister    Bone cancer Sister    Cancer Sister    Cancer Sister        kidney   Cancer Daughter    Pancreatic cancer Daughter    Thyroid disease Daughter    Thyroid disease Daughter        thyroid nodules   Hypertension Daughter    Irritable bowel syndrome Daughter    Cancer Daughter        thyroid   Cancer Brother    Prostate cancer Brother     Social History:  reports that she has never smoked. She has never used smokeless tobacco. She reports that she does not drink alcohol and does not use drugs.  Allergies:  Allergies  Allergen Reactions   Alphagan [Brimonidine] Other (See Comments)    Secondary infection and vision changes. Allergy not listed on MAR     Medications: Prior to Admission medications   Medication Sig Start Date End Date Taking? Authorizing Provider  acetaminophen (TYLENOL) 650 MG CR tablet Take 2 tablets (1,300 mg total) by mouth every 6 (six) hours as needed for pain (for mild pain). Patient taking differently: Take 1,300 mg by mouth every 8 (eight) hours as needed for pain. 05/15/21  Yes Calvert Cantor, MD  aspirin EC 81 MG tablet Take 81 mg by mouth in the morning.   Yes [provider]  atorvastatin (LIPITOR) 20 MG tablet Take 20 mg by mouth in the morning.   Yes [provider]  COMBIGAN 0.2-0.5 % ophthalmic solution Place 1 drop into both eyes in the morning and at bedtime. 03/14/23  Yes [provider]   Dextran 70-Hypromellose 0.1-0.3 % SOLN Apply 2 drops to eye every 12 (twelve) hours as needed (dry, itchy, irritated eyes).   Yes [provider]  dextromethorphan-guaiFENesin (ROBITUSSIN-DM) 10-100 MG/5ML liquid Take 10 mLs by mouth every 6 (six) hours as needed for cough.   Yes [provider]  folic acid (FOLVITE) 1 MG tablet  Take 1 mg by mouth in the morning.   Yes [provider]  latanoprost (XALATAN) 0.005 % ophthalmic solution Place 1 drop into both eyes at bedtime. 03/10/23  Yes [provider]  Menthol, Topical Analgesic, (BIOFREEZE COOL THE PAIN) 4 % GEL Apply 1 Application topically 3 (three) times daily.   Yes [provider]  Multiple Vitamin (MULTIVITAMIN WITH MINERALS) TABS tablet Take 1 tablet by mouth in the morning.   Yes [provider]  NON FORMULARY Take 120 mLs by mouth in the morning and at bedtime. Med pass   Yes [provider]  Omega 3 1000 MG CAPS Take 1,000 mg by mouth daily.   Yes [provider]  polyethylene glycol (MIRALAX / GLYCOLAX) 17 g packet Take 17 g by mouth in the morning.   Yes [provider]  Probiotic Product (PROBIOTIC PO) Take 1 capsule by mouth daily.   Yes [provider]  Trolamine Salicylate (ASPERCREME EX) Apply 1 application  topically in the morning and at bedtime. Apply to both legs   Yes [provider]  UNABLE TO FIND Take 120 mLs by mouth in the morning, at noon, and at bedtime. Med Name: Mighty Shake   Yes [provider]  nitroGLYCERIN (NITROSTAT) 0.4 MG SL tablet Place 1 tablet (0.4 mg total) under the tongue every 5 (five) minutes as needed for chest pain. 10/01/20 07/01/27  Lewayne Bunting, MD  ondansetron (ZOFRAN) 4 MG tablet Take 4 mg by mouth every 8 (eight) hours as needed for nausea or vomiting.    [provider]    I have reviewed the patient's current and prior to admission medications.  Labs:     Latest Ref  Rng & Units 07/13/2023    4:21 AM 07/12/2023   11:40 AM 04/17/2023    3:42 PM  BMP  Glucose 70 - 99 mg/dL 93  89  604   BUN 8 - 23 mg/dL 49  53  16   Creatinine 0.44 - 1.00 mg/dL 5.40  9.81  1.91   Sodium 135 - 145 mmol/L 136  133  136   Potassium 3.5 - 5.1 mmol/L 4.3  4.5  4.4   Chloride 98 - 111 mmol/L 108  101  104   CO2 22 - 32 mmol/L 19  20  24    Calcium 8.9 - 10.3 mg/dL 7.6  8.7  8.9     Urinalysis    Component Value Date/Time   COLORURINE YELLOW 07/13/2023 0930   APPEARANCEUR CLOUDY (A) 07/13/2023 0930   LABSPEC 1.010 07/13/2023 0930   PHURINE 6.0 07/13/2023 0930   GLUCOSEU NEGATIVE 07/13/2023 0930   HGBUR SMALL (A) 07/13/2023 0930   BILIRUBINUR NEGATIVE 07/13/2023 0930   KETONESUR NEGATIVE 07/13/2023 0930   PROTEINUR 30 (A) 07/13/2023 0930   NITRITE NEGATIVE 07/13/2023 0930   LEUKOCYTESUR LARGE (A) 07/13/2023 0930     ROS:  Pertinent items noted in HPI and remainder of comprehensive ROS otherwise negative.  History provided mostly by family as patient states that she would like to go back to sleep   Physical Exam: Vitals:   07/13/23 0442 07/13/23 1222  BP: (!) 110/54 128/76  Pulse: (!) 54 (!) 58  Resp: 17 16  Temp: 97.8 F (36.6 C) 98.1 F (36.7 C)  SpO2: 99% 100%     General:  elderly female in bed in NAD, frail HEENT: NCAT Eyes: EOMI sclera anicteric Neck: supple trachea midline  Heart: S1S2 no rub Lungs:  clear to auscultation; normal work of breathing on room air Abdomen: soft/nt/nd Extremities: no edema appreciated; no cyanosis or clubbing Skin: no rash on extremities exposed Neuro: wakes with exam; states her name and then states to "let me go back to sleep" Psych normal mood and affect  Assessment/Plan:  # AKI  - May be pre-renal in the setting of concern for possible infection/confusion.  CK is fine - Appears improving on initial check - Continue supportive care - Reduce NS to 75 ml/hr - Appreciate urology - she has a stent currently in  place  # Nephrolithiasis  - non-obstructive per radiology  - per urology  # CKD stage 3 - Baseline Cr 1-1.4  # HTN  - avoid hypotension   # Anemia - normocytic - mild and s/p hydration - check iron panel  - she is on folic acid  Thank you for the consult.  Please do not hesitate to contact me with any questions regarding the patient  Estanislado Emms 07/13/2023, 6:42 PM     Note also 500 mL urine output in foley   Estanislado Emms, MD 6:45 PM 07/13/2023

## 2023-07-13 NOTE — Progress Notes (Addendum)
 PROGRESS NOTE  Holly Nunez  ZOX:[id number] DOB: 1925-02-03 DOA: 07/12/2023 PCP: Default, Provider, MD   Brief Narrative: Patient is a 89 year old female with history of anemia, osteoarthritis, gout,, hyperlipidemia, hypertension, paroxysmal A-fib who was sent by her urologist for the evaluation of acute kidney injury.  She was scheduled for lithotripsy by Dr. Pete Glatter but was found to have acute kidney injury.  On presentation, lab work showed creatinine of 5.9, potassium 4.5.  Patient was admitted for the further management of AKI.  Her baseline creatinine is normal.  Nephrology consulted  Assessment & Plan:  Principal Problem:   AKI (acute kidney injury) (HCC) Active Problems:   HTN (hypertension)   Hyperlipidemia   Normocytic anemia   Hyperglycemia   CKD stage 3a, GFR 45-59 ml/min (HCC)   Unspecified atrial fibrillation (HCC)   Cognitive deficits   Hypocalcemia   Hyponatremia   Urolithiasis  AKI: On reviewing her previous lab work, her kidney function is normal.  Last creatinine of 0.8 as per 04/17/2023.  Presented with creatinine of 5.8.    CT abdomen/pelvis showed right large renal cyst, unremarkable urinary bladder.  Foley catheter placed.  Nephrology following.  Urine sodium of 62.  Continue IV fluid.  Appears dehydrated.  NAGMA: Started on sodium bicarb tablets.  Nephrolithiasis: Follows with urology.  Plan was for a lithotripsy.  Dr. Pete Glatter aware.  Holding the procedure until improvement in the kidney function. CT abdomen/pelvis showed  right ureteral stent , resolution of right hydroureteronephrosis,nonobstructive bilateral 1-3 mm nephrolithiasis.  Hypertension: Currently normotensive.  Hyperlipidemia: Takes Lipitor at home  Normocytic anemia: Currently hemoglobin stable  Paroxysmal A-fib: On aspirin at home.  Currently on normal sinus rhythm  History of cognitive deficits: History of TIA.  Takes aspirin.  Patient is from nursing facility.  Will consult  TOC      Pressure Injury 07/12/23 Sacrum Stage 2 -  Partial thickness loss of dermis presenting as a shallow open injury with a red, pink wound bed without slough. open skin, redness (Active)  07/12/23 1842  Location: Sacrum  Location Orientation:   Staging: Stage 2 -  Partial thickness loss of dermis presenting as a shallow open injury with a red, pink wound bed without slough.  Wound Description (Comments): open skin, redness  Present on Admission:   Dressing Type None 07/12/23 1818    DVT prophylaxis:SCD's Start: 07/12/23 1050     Code Status: Full Code  Family Communication: Daughter at bedside  Patient status:Inpatient  Patient is from :home  Anticipated discharge WJ:XBJY  Estimated DC date:tomorrow   Consultants: Nephrology  Procedures: None  Antimicrobials:  Anti-infectives (From admission, onward)    Start     Dose/Rate Route Frequency Ordered Stop   07/12/23 1049  cefTRIAXone (ROCEPHIN) 1 g in sodium chloride 0.9 % 100 mL IVPB  Status:  Discontinued        1 g 200 mL/hr over 30 Minutes Intravenous 30 min pre-op 07/12/23 1049 07/12/23 1805       Subjective: Patient seen and examined at bedside today.  Hemodynamically stable.  Comfortable.  Not in any acute distress.  Daughter at bedside.  She states she is hungry and wanted food. Foley placed this morning.  Objective: Vitals:   07/12/23 1813 07/12/23 2103 07/13/23 0214 07/13/23 0442  BP: 131/60 (!) 137/56 (!) 120/59 (!) 110/54  Pulse: (!) 55 (!) 56 (!) 51 (!) 54  Resp: 18 16 19 17   Temp: 98.5 F (36.9 C) (!) 97.4 F (36.3 C) 97.6 F (  36.4 C) 97.8 F (36.6 C)  TempSrc: Oral Oral Oral Oral  SpO2: 99% 100% 100% 99%  Weight:      Height:        Intake/Output Summary (Last 24 hours) at 07/13/2023 0802 Last data filed at 07/13/2023 0308 Gross per 24 hour  Intake 582.4 ml  Output --  Net 582.4 ml   Filed Weights   07/01/23 1403 07/12/23 1108  Weight: 48.1 kg 48.1 kg     Examination:  General exam: Overall comfortable, not in distress, pleasant elderly female HEENT: PERRL, dry mouth Respiratory system:  no wheezes or crackles  Cardiovascular system: S1 & S2 heard, RRR.  Gastrointestinal system: Abdomen is nondistended, soft and nontender. Central nervous system: Alert and oriented Extremities: No edema, no clubbing ,no cyanosis Skin: No rashes, no ulcers,no icterus   GU :Foley   Data Reviewed: I have personally reviewed following labs and imaging studies  CBC: Recent Labs  Lab 07/12/23 1140 07/13/23 0421  WBC 6.7 5.0  HGB 11.6* 10.4*  HCT 36.1 33.2*  MCV 96.5 96.5  PLT 196 177   Basic Metabolic Panel: Recent Labs  Lab 07/12/23 1140 07/13/23 0421  NA 133* 136  K 4.5 4.3  CL 101 108  CO2 20* 19*  GLUCOSE 89 93  BUN 53* 49*  CREATININE 5.98* 5.29*  CALCIUM 8.7* 7.6*     No results found for this or any previous visit (from the past 240 hours).   Radiology Studies: No results found.  Scheduled Meds:  aspirin EC  81 mg Oral Daily   brimonidine  1 drop Both Eyes BID   And   timolol  1 drop Both Eyes BID   folic acid  1 mg Oral Daily   free water  200 mL Oral Q2H   latanoprost  1 drop Both Eyes QHS   Muscle Rub  1 Application Topical TID   Continuous Infusions:  sodium chloride 50 mL/hr at 07/13/23 0308     LOS: 1 day   Burnadette Pop, MD [facility] P2/26/2025, 8:02 AM

## 2023-07-14 DIAGNOSIS — Z87440 Personal history of urinary (tract) infections: Secondary | ICD-10-CM

## 2023-07-14 DIAGNOSIS — N201 Calculus of ureter: Secondary | ICD-10-CM | POA: Diagnosis not present

## 2023-07-14 DIAGNOSIS — N179 Acute kidney failure, unspecified: Secondary | ICD-10-CM | POA: Diagnosis not present

## 2023-07-14 LAB — BASIC METABOLIC PANEL
Anion gap: 8 (ref 5–15)
BUN: 48 mg/dL — ABNORMAL HIGH (ref 8–23)
CO2: 18 mmol/L — ABNORMAL LOW (ref 22–32)
Calcium: 7.8 mg/dL — ABNORMAL LOW (ref 8.9–10.3)
Chloride: 107 mmol/L (ref 98–111)
Creatinine, Ser: 5.51 mg/dL — ABNORMAL HIGH (ref 0.44–1.00)
GFR, Estimated: 7 mL/min — ABNORMAL LOW (ref >60)
Glucose, Bld: 84 mg/dL (ref 70–99)
Potassium: 3.9 mmol/L (ref 3.5–5.1)
Sodium: 133 mmol/L — ABNORMAL LOW (ref 135–145)

## 2023-07-14 LAB — URINE CULTURE: Culture: 70000 — AB

## 2023-07-14 LAB — FERRITIN: Ferritin: 199 ng/mL (ref 11–307)

## 2023-07-14 LAB — IRON AND TIBC
Iron: 70 ug/dL (ref 28–170)
Saturation Ratios: 35 % — ABNORMAL HIGH (ref 10.4–31.8)
TIBC: 203 ug/dL — ABNORMAL LOW (ref 250–450)
UIBC: 133 ug/dL

## 2023-07-14 LAB — CBC
HCT: 30.5 % — ABNORMAL LOW (ref 36.0–46.0)
Hemoglobin: 10.3 g/dL — ABNORMAL LOW (ref 12.0–15.0)
MCH: 30.8 pg (ref 26.0–34.0)
MCHC: 33.8 g/dL (ref 30.0–36.0)
MCV: 91.3 fL (ref 80.0–100.0)
Platelets: 193 10*3/uL (ref 150–400)
RBC: 3.34 MIL/uL — ABNORMAL LOW (ref 3.87–5.11)
RDW: 14.4 % (ref 11.5–15.5)
WBC: 6 10*3/uL (ref 4.0–10.5)
nRBC: 0 % (ref 0.0–0.2)

## 2023-07-14 MED ORDER — SODIUM CHLORIDE 0.9 % IV SOLN
1.0000 g | INTRAVENOUS | Status: DC
  Administered 2023-07-14: 1 g via INTRAVENOUS
  Filled 2023-07-14: qty 10

## 2023-07-14 NOTE — Progress Notes (Signed)
 Urology Inpatient Progress Note  Subjective: Patient resting comfortably. Foley draining clear urine.  Anti-infectives: Anti-infectives (From admission, onward)    Start     Dose/Rate Route Frequency Ordered Stop   07/12/23 1049  cefTRIAXone (ROCEPHIN) 1 g in sodium chloride 0.9 % 100 mL IVPB  Status:  Discontinued        1 g 200 mL/hr over 30 Minutes Intravenous 30 min pre-op 07/12/23 1049 07/12/23 1805       Current Facility-Administered Medications  Medication Dose Route Frequency Provider Last Rate Last Admin   0.9 %  sodium chloride infusion   Intravenous Continuous Estanislado Emms, MD 75 mL/hr at 07/14/23 1453 New Bag at 07/14/23 1453   acetaminophen (TYLENOL) tablet 650 mg  650 mg Oral Q6H PRN Bobette Mo, MD       Or   acetaminophen (TYLENOL) suppository 650 mg  650 mg Rectal Q6H PRN Bobette Mo, MD       aspirin EC tablet 81 mg  81 mg Oral Daily Bobette Mo, MD   81 mg at 07/14/23 5784   brimonidine (ALPHAGAN) 0.2 % ophthalmic solution 1 drop  1 drop Both Eyes BID Bobette Mo, MD   1 drop at 07/14/23 6962   And   timolol (TIMOPTIC) 0.5 % ophthalmic solution 1 drop  1 drop Both Eyes BID Bobette Mo, MD   1 drop at 07/14/23 9528   Chlorhexidine Gluconate Cloth 2 % PADS 6 each  6 each Topical Q0600 Burnadette Pop, MD   6 each at 07/14/23 0622   folic acid (FOLVITE) tablet 1 mg  1 mg Oral Daily Bobette Mo, MD   1 mg at 07/14/23 4132   guaiFENesin-dextromethorphan (ROBITUSSIN DM) 100-10 MG/5ML syrup 10 mL  10 mL Oral Q6H PRN Bobette Mo, MD       latanoprost (XALATAN) 0.005 % ophthalmic solution 1 drop  1 drop Both Eyes QHS Bobette Mo, MD   1 drop at 07/13/23 2208   Muscle Rub CREA 1 Application  1 Application Topical TID Bobette Mo, MD   1 Application at 07/14/23 [phone]   nitroGLYCERIN (NITROSTAT) SL tablet 0.4 mg  0.4 mg Sublingual Q5 min PRN Bobette Mo, MD       ondansetron [facility]) tablet 4 mg   4 mg Oral Q6H PRN Bobette Mo, MD       Or   ondansetron [facility]) injection 4 mg  4 mg Intravenous Q6H PRN Bobette Mo, MD   4 mg at 07/14/23 1246   polyethylene glycol (MIRALAX / GLYCOLAX) packet 17 g  17 g Oral Daily Burnadette Pop, MD   17 g at 07/14/23 0272   senna-docusate (Senokot-S) tablet 1 tablet  1 tablet Oral BID Burnadette Pop, MD   1 tablet at 07/14/23 5366   sodium bicarbonate tablet 650 mg  650 mg Oral TID Burnadette Pop, MD   650 mg at 07/14/23 4403     Objective: Vital signs in last 24 hours: Temp:  [97.7 F (36.5 C)-97.8 F (36.6 C)] 97.7 F (36.5 C) (02/27 0221) Pulse Rate:  [62-64] 64 (02/27 0221) Resp:  [17-19] 17 (02/27 0221) BP: (115-137)/(62-72) 137/72 (02/27 0221) SpO2:  [99 %] 99 % (02/27 0221)  Intake/Output from previous day: 02/26 0701 - 02/27 0700 In: 741.8 [P.O.:240; I.V.:501.8] Out: 750 [Urine:750] Intake/Output this shift: No intake/output data recorded.  GENERAL APPEARANCE:  Well appearing, well developed, well nourished, NAD HEENT:  Atraumatic, normocephalic,  oropharynx clear ABDOMEN:  Soft, non-tender, no masses MENTAL STATUS:  appropriate BACK:  Non-tender to palpation, No CVAT SKIN:  Warm, dry, and intact GU:  foley draining clear urine  Lab Results:  Recent Labs    07/13/23 0421 07/14/23 0415  WBC 5.0 6.0  HGB 10.4* 10.3*  HCT 33.2* 30.5*  PLT 177 193   BMET Recent Labs    07/13/23 0421 07/14/23 0415  NA 136 133*  K 4.3 3.9  CL 108 107  CO2 19* 18*  GLUCOSE 93 84  BUN 49* 48*  CREATININE 5.29* 5.51*  CALCIUM 7.6* 7.8*   PT/INR No results for input(s): "LABPROT", "INR" in the last 72 hours. ABG No results for input(s): "PHART", "HCO3" in the last 72 hours.  Invalid input(s): "PCO2", "PO2"  Studies/Results: CT ABDOMEN PELVIS WO CONTRAST Result Date: 07/13/2023 CLINICAL DATA:  UTI, recurrent/complicated (Female) EXAM: CT ABDOMEN AND PELVIS WITHOUT CONTRAST TECHNIQUE: Multidetector CT imaging of  the abdomen and pelvis was performed following the standard protocol without IV contrast. RADIATION DOSE REDUCTION: This exam was performed according to the departmental dose-optimization program which includes automated exposure control, adjustment of the mA and/or kV according to patient size and/or use of iterative reconstruction technique. COMPARISON:  CT abdomen pelvis 03/18/2023 FINDINGS: Lower chest: Interval increase in small right and interval development of trace left pleural effusions. Bilateral lower lobe atelectasis. Four-vessel coronary calcification. Aortic valve leaflet calcification. Mitral annular calcification. Cardiac changes suggestive of anemia. Hepatobiliary: No focal liver abnormality. No gallstones, gallbladder wall thickening, or pericholecystic fluid. No biliary dilatation. Pancreas: No focal lesion. Normal pancreatic contour. No surrounding inflammatory changes. No main pancreatic ductal dilatation. Spleen: Normal in size without focal abnormality. Adrenals/Urinary Tract: No adrenal nodule bilaterally. Large fluid density lesion of the right kidney likely represents a simple renal cyst. Simple renal cysts, in the absence of clinically indicated signs/symptoms, require no independent follow-up. Interval placement of a right ureteral stent with proximal pigtail along the right renal pelvis and distal pigtail within the urinary bladder lumen. Interval resolution of right hydronephrosis. No obstructive right nephroureterolithiasis. Punctate right nephrolithiasis. Curvilinear 3 mm left nephrolithiasis no left ureterolithiasis. No left hydroureteronephrosis. The urinary bladder is unremarkable. Stomach/Bowel: Stomach is within normal limits. No evidence of bowel wall thickening or dilatation. Stool throughout the colon. Appendix appears normal. Vascular/Lymphatic: No abdominal aorta or iliac aneurysm. Severe atherosclerotic plaque of the aorta and its branches. No abdominal, pelvic, or inguinal  lymphadenopathy. Reproductive: Status post hysterectomy. No adnexal masses. Other: No intraperitoneal free fluid. No intraperitoneal free gas. No organized fluid collection. Musculoskeletal: No abdominal wall hernia or abnormality. No suspicious lytic or blastic osseous lesions. No acute displaced fracture. IMPRESSION: 1. Interval placement of a right ureteral stent with interval dilution of right hydroureteronephrosis. 2. Nonobstructive bilateral 1-3 mm nephrolithiasis. 3. Stool throughout the colon-correlate for constipation. 4. Interval increase in small right and interval development of trace left pleural effusions. 5. Aortic Atherosclerosis (ICD10-I70.0) including four-vessel coronary artery, mitral annular, aortic valve leaflet calcifications-correlate for aortic stenosis. 6. Otherwise limited evaluation on this noncontrast study. Electronically Signed   By: Tish Frederickson M.D.   On: 07/13/2023 10:14     Assessment & Plan: AKI Right ureteral calculus History of UTI  Her renal function has not significantly changed with hydration. No evidence of obstruction on CT.   Appreciate Nephrology input. Cause of AKI unclear at this point.   I would like to see her renal function improving prior to any surgical intervention.  Her stent  remains in good position.  Discussed with patient's daughter in room today.  Di Kindle, MD 07/14/2023

## 2023-07-14 NOTE — Progress Notes (Signed)
 PROGRESS NOTE  Holly Nunez  ZOX:[id number] DOB: Mar 23, 1925 DOA: 07/12/2023 PCP: Default, Provider, MD   Brief Narrative: Patient is a 89 year old female with history of anemia, osteoarthritis, gout,, hyperlipidemia, hypertension, paroxysmal A-fib ,CKD stage 3 a who was sent by her urologist for the evaluation of acute kidney injury.  She was scheduled for lithotripsy by Dr. Pete Glatter but was found to have acute kidney injury.  On presentation, lab work showed creatinine of 5.9, potassium 4.5.  Patient was admitted for the further management of AKI.  Her baseline creatinine is 1.1-1.4  Nephrology consulted.  Creatinine is still in the range of 5 today.  Assessment & Plan:  Principal Problem:   AKI (acute kidney injury) (HCC) Active Problems:   HTN (hypertension)   Hyperlipidemia   Normocytic anemia   Hyperglycemia   CKD stage 3a, GFR 45-59 ml/min (HCC)   Unspecified atrial fibrillation (HCC)   Cognitive deficits   Hypocalcemia   Hyponatremia   Urolithiasis  AKI on CKD 3a: On reviewing her previous lab work, her baseline creatinine ranges from 1-1.4.  Last creatinine of 0.8 as per 04/17/2023.  Presented with creatinine of 5.8.    CT abdomen/pelvis showed right large renal cyst, unremarkable urinary bladder.  Foley catheter placed.  Nephrology following.  Urine sodium of 62.  Continue IV fluid for now.  Creatinine is still in the range of 5 today.  Appears almost euvolemic or slightly dehydrated.  NAGMA: Started on sodium bicarb tablets.  Nephrolithiasis: Follows with urology.  Plan was for a lithotripsy.  Dr. Pete Glatter aware.  Holding the procedure until improvement in the kidney function. CT abdomen/pelvis showed  right ureteral stent , resolution of right hydroureteronephrosis,nonobstructive bilateral 1-3 mm nephrolithiasis.  Hypertension: Currently normotensive.  Hyperlipidemia: Takes Lipitor at home  Normocytic anemia: Currently hemoglobin stable  Paroxysmal A-fib: On aspirin  at home.  Currently on normal sinus rhythm  History of cognitive deficits: History of TIA.  Takes aspirin.  Patient is from nursing facility.  TOC consulted      Pressure Injury 07/12/23 Sacrum Stage 2 -  Partial thickness loss of dermis presenting as a shallow open injury with a red, pink wound bed without slough. open skin, redness (Active)  07/12/23 1842  Location: Sacrum  Location Orientation:   Staging: Stage 2 -  Partial thickness loss of dermis presenting as a shallow open injury with a red, pink wound bed without slough.  Wound Description (Comments): open skin, redness  Present on Admission:   Dressing Type Foam - Lift dressing to assess site every shift 07/13/23 0855    DVT prophylaxis:SCD's Start: 07/12/23 1050     Code Status: Full Code  Family Communication: Daughter [name] on phone on 2/27  Patient status:Inpatient  Patient is from :SNF  Anticipated discharge to:SnF  Estimated DC date:not sure, awaiting further improvement in the kidney function   Consultants: Nephrology  Procedures: None  Antimicrobials:  Anti-infectives (From admission, onward)    Start     Dose/Rate Route Frequency Ordered Stop   07/12/23 1049  cefTRIAXone (ROCEPHIN) 1 g in sodium chloride 0.9 % 100 mL IVPB  Status:  Discontinued        1 g 200 mL/hr over 30 Minutes Intravenous 30 min pre-op 07/12/23 1049 07/12/23 1805       Subjective: Patient seen and examined at bedside today.  Hemodynamically stable.  Comfortably lying in bed.  Denies new complaints.  Hungry and wants to eat food.  Foley draining urine.  On room air.  Not in any kind of distress.  Objective: Vitals:   07/13/23 0442 07/13/23 1222 07/13/23 2101 07/14/23 0221  BP: (!) 110/54 128/76 115/62 137/72  Pulse: (!) 54 (!) 58 62 64  Resp: 17 16 19 17   Temp: 97.8 F (36.6 C) 98.1 F (36.7 C) 97.8 F (36.6 C) 97.7 F (36.5 C)  TempSrc: Oral Oral Oral Oral  SpO2: 99% 100% 99% 99%  Weight:      Height:         Intake/Output Summary (Last 24 hours) at 07/14/2023 1052 Last data filed at 07/14/2023 0865 Gross per 24 hour  Intake 621.83 ml  Output 600 ml  Net 21.83 ml   Filed Weights   07/01/23 1403 07/12/23 1108  Weight: 48.1 kg 48.1 kg    Examination:   General exam: Overall comfortable, not in distress, pleasant elderly female, deconditioned HEENT: PERRL Respiratory system:  no wheezes or crackles  Cardiovascular system: S1 & S2 heard, RRR.  Gastrointestinal system: Abdomen is nondistended, soft and nontender. Central nervous system: Alert and oriented Extremities: No edema, no clubbing ,no cyanosis Skin: No rashes, no ulcers,no icterus   GU: Foley   Data Reviewed: I have personally reviewed following labs and imaging studies  CBC: Recent Labs  Lab 07/12/23 1140 07/13/23 0421 07/14/23 0415  WBC 6.7 5.0 6.0  HGB 11.6* 10.4* 10.3*  HCT 36.1 33.2* 30.5*  MCV 96.5 96.5 91.3  PLT 196 177 193   Basic Metabolic Panel: Recent Labs  Lab 07/12/23 1140 07/13/23 0421 07/14/23 0415  NA 133* 136 133*  K 4.5 4.3 3.9  CL 101 108 107  CO2 20* 19* 18*  GLUCOSE 89 93 84  BUN 53* 49* 48*  CREATININE 5.98* 5.29* 5.51*  CALCIUM 8.7* 7.6* 7.8*     Recent Results (from the past 240 hours)  Urine Culture     Status: Abnormal   Collection Time: 07/12/23  8:56 AM   Specimen: Urine, Clean Catch  Result Value Ref Range Status   Specimen Description   Final    URINE, CLEAN CATCH Performed at [facility], [address] [place], [place] [address]    Special Requests   Final    NONE Performed at [facility], [address] [place], [place] [address]    Culture 70,000 COLONIES/mL YEAST (A)  Final   Report Status 07/14/2023 FINAL  Final     Radiology Studies: CT ABDOMEN PELVIS WO CONTRAST Result Date: 07/13/2023 CLINICAL DATA:  UTI, recurrent/complicated (Female) EXAM: CT ABDOMEN AND PELVIS WITHOUT CONTRAST TECHNIQUE: Multidetector CT  imaging of the abdomen and pelvis was performed following the standard protocol without IV contrast. RADIATION DOSE REDUCTION: This exam was performed according to the departmental dose-optimization program which includes automated exposure control, adjustment of the mA and/or kV according to patient size and/or use of iterative reconstruction technique. COMPARISON:  CT abdomen pelvis 03/18/2023 FINDINGS: Lower chest: Interval increase in small right and interval development of trace left pleural effusions. Bilateral lower lobe atelectasis. Four-vessel coronary calcification. Aortic valve leaflet calcification. Mitral annular calcification. Cardiac changes suggestive of anemia. Hepatobiliary: No focal liver abnormality. No gallstones, gallbladder wall thickening, or pericholecystic fluid. No biliary dilatation. Pancreas: No focal lesion. Normal pancreatic contour. No surrounding inflammatory changes. No main pancreatic ductal dilatation. Spleen: Normal in size without focal abnormality. Adrenals/Urinary Tract: No adrenal nodule bilaterally. Large fluid density lesion of the right kidney likely represents a simple renal cyst. Simple renal cysts, in the absence of clinically  indicated signs/symptoms, require no independent follow-up. Interval placement of a right ureteral stent with proximal pigtail along the right renal pelvis and distal pigtail within the urinary bladder lumen. Interval resolution of right hydronephrosis. No obstructive right nephroureterolithiasis. Punctate right nephrolithiasis. Curvilinear 3 mm left nephrolithiasis no left ureterolithiasis. No left hydroureteronephrosis. The urinary bladder is unremarkable. Stomach/Bowel: Stomach is within normal limits. No evidence of bowel wall thickening or dilatation. Stool throughout the colon. Appendix appears normal. Vascular/Lymphatic: No abdominal aorta or iliac aneurysm. Severe atherosclerotic plaque of the aorta and its branches. No abdominal, pelvic,  or inguinal lymphadenopathy. Reproductive: Status post hysterectomy. No adnexal masses. Other: No intraperitoneal free fluid. No intraperitoneal free gas. No organized fluid collection. Musculoskeletal: No abdominal wall hernia or abnormality. No suspicious lytic or blastic osseous lesions. No acute displaced fracture. IMPRESSION: 1. Interval placement of a right ureteral stent with interval dilution of right hydroureteronephrosis. 2. Nonobstructive bilateral 1-3 mm nephrolithiasis. 3. Stool throughout the colon-correlate for constipation. 4. Interval increase in small right and interval development of trace left pleural effusions. 5. Aortic Atherosclerosis (ICD10-I70.0) including four-vessel coronary artery, mitral annular, aortic valve leaflet calcifications-correlate for aortic stenosis. 6. Otherwise limited evaluation on this noncontrast study. Electronically Signed   By: Tish Frederickson M.D.   On: 07/13/2023 10:14    Scheduled Meds:  aspirin EC  81 mg Oral Daily   brimonidine  1 drop Both Eyes BID   And   timolol  1 drop Both Eyes BID   Chlorhexidine Gluconate Cloth  6 each Topical Q0600   folic acid  1 mg Oral Daily   latanoprost  1 drop Both Eyes QHS   Muscle Rub  1 Application Topical TID   polyethylene glycol  17 g Oral Daily   senna-docusate  1 tablet Oral BID   sodium bicarbonate  650 mg Oral TID   Continuous Infusions:  sodium chloride 75 mL/hr at 07/14/23 0104     LOS: 2 days   Burnadette Pop, MD [facility] P2/27/2025, 10:52 AM

## 2023-07-14 NOTE — Progress Notes (Signed)
 [facility] Progress Note  Name: Holly Nunez MRN: [id number] DOB: 05/21/1924  Chief Complaint:  Planned lithotripsy procedure  Subjective:  She had 750 mL UOP over 2/26.  She has been on normal saline at 75 ml/hr.  Foley has been in place.  Her daughter and granddaughter are at bedside.  Her family is worried about her having a UTI.  Her daughter does not think that she could tolerate something like dialysis and I agree.      Review of systems:  She has had nausea She denies pain  No shortness of breath  -------------- Background on consult:  Rickie Gutierres is a 89 y.o. female with a history of coronary artery disease, hypertension, atrial fibrillation, CKD stage 3, and urinary incontinence who presented to the hospital for a planned lithotripsy with Dr. Pete Glatter.  Her labs demonstrated AKI and the procedure was deferred.  Nephrology is consulted for assistance with management of AKI. Her creatinine has improved slightly from 5.98 on presentation to 5.29.  baseline Cr is variable but most often between 1 - 1.4.  She has been started on normal saline and has been on NS at 100 ml/hr on my exam.  Urology has followed her case as well.  She has a non-obstructing stone and a right ureteral stent with interval dilution of right hydroureteronephrosis.  No obstructive right nephroureterolithiasis.  I spoke with her daughter and granddaughter at bedside.  The patient has been a little more confused recently.  Her daughter thinks that she may have had a UTI and they didn't know it.  Ceftriaxone was ordered on 2/25 but not given.  She wakens with my exam and after a couple of questions says "let me go back to sleep."  Her family laughs and thinks she is getting back to herself.      Intake/Output Summary (Last 24 hours) at 07/14/2023 1749 Last data filed at 07/14/2023 7829 Gross per 24 hour  Intake --  Output 450 ml  Net -450 ml    Vitals:  Vitals:   07/13/23 0442 07/13/23  1222 07/13/23 2101 07/14/23 0221  BP: (!) 110/54 128/76 115/62 137/72  Pulse: (!) 54 (!) 58 62 64  Resp: 17 16 19 17   Temp: 97.8 F (36.6 C) 98.1 F (36.7 C) 97.8 F (36.6 C) 97.7 F (36.5 C)  TempSrc: Oral Oral Oral Oral  SpO2: 99% 100% 99% 99%  Weight:      Height:         Physical Exam:  General:  elderly female in bed in NAD, frail HEENT: NCAT Eyes: EOMI sclera anicteric Neck: supple trachea midline  Heart: S1S2 no rub Lungs: clear to auscultation; normal work of breathing on room air Abdomen: soft/nt/nd Extremities: no edema appreciated; no cyanosis or clubbing Skin: no rash on extremities exposed Neuro: she is awake on arrival.  Alert and oriented to person not to year. Recognizes family  Psych normal mood and affect Gu foley in place with yellow urine  Medications reviewed   Labs:     Latest Ref Rng & Units 07/14/2023    4:15 AM 07/13/2023    4:21 AM 07/12/2023   11:40 AM  BMP  Glucose 70 - 99 mg/dL 84  93  89   BUN 8 - 23 mg/dL 48  49  53   Creatinine 0.44 - 1.00 mg/dL 5.62  1.30  8.65   Sodium 135 - 145 mmol/L 133  136  133   Potassium 3.5 - 5.1 mmol/L  3.9  4.3  4.5   Chloride 98 - 111 mmol/L 107  108  101   CO2 22 - 32 mmol/L 18  19  20    Calcium 8.9 - 10.3 mg/dL 7.8  7.6  8.7      Assessment/Plan:   # AKI  - May be pre-renal in the setting of concern for possible infection/confusion.  CK is fine - she has largely plateaued after slight initial improvement.  She has not had hypotension   - Continue supportive care - continue NS - set at 100 ml/hr - Appreciate urology - she has a stent currently in place.  Additional procedures per urology discretion    - Will order ceftriaxone x 3 days  - nausea supportive care per primary team    # Nephrolithiasis  - non-obstructive per radiology  - per urology   # CKD stage 3 - Baseline Cr 1-1.4   # HTN  - controlled - avoid hypotension    # Anemia - normocytic - mild and s/p hydration - iron panel is  acceptable  - she is on folic acid  Disposition - continue inpatient monitoring    Estanislado Emms, MD 07/14/2023 6:19 PM

## 2023-07-15 ENCOUNTER — Other Ambulatory Visit: Payer: Self-pay | Admitting: Urology

## 2023-07-15 ENCOUNTER — Ambulatory Visit: Payer: Self-pay | Admitting: Urology

## 2023-07-15 DIAGNOSIS — N201 Calculus of ureter: Secondary | ICD-10-CM

## 2023-07-15 DIAGNOSIS — N179 Acute kidney failure, unspecified: Secondary | ICD-10-CM | POA: Diagnosis not present

## 2023-07-15 LAB — BASIC METABOLIC PANEL
Anion gap: 8 (ref 5–15)
BUN: 45 mg/dL — ABNORMAL HIGH (ref 8–23)
CO2: 18 mmol/L — ABNORMAL LOW (ref 22–32)
Calcium: 8.1 mg/dL — ABNORMAL LOW (ref 8.9–10.3)
Chloride: 113 mmol/L — ABNORMAL HIGH (ref 98–111)
Creatinine, Ser: 5.05 mg/dL — ABNORMAL HIGH (ref 0.44–1.00)
GFR, Estimated: 7 mL/min — ABNORMAL LOW (ref >60)
Glucose, Bld: 91 mg/dL (ref 70–99)
Potassium: 3.8 mmol/L (ref 3.5–5.1)
Sodium: 139 mmol/L (ref 135–145)

## 2023-07-15 NOTE — Progress Notes (Signed)
 [facility] Progress Note  Name: Valena Ivanov MRN: [id number] DOB: [birth year]  Chief Complaint:  Planned lithotripsy procedure  Subjective:  She had 425 mL UOP over 2/27.  She has been on normal saline at 100 ml/hr.  Foley has been in place.  Her daughter is at bedside.  She has 500 mL UOP in the foley bag currently  Review of systems:   No nausea today.  She is hungry.  Didn't eat much of lunch because they brought the wrong sandwich and she needed mustard No shortness of breath or chest pain  -------------- Background on consult:  Ameliyah Sarno is a 89 y.o. female with a history of coronary artery disease, hypertension, atrial fibrillation, CKD stage 3, and urinary incontinence who presented to the hospital for a planned lithotripsy with Dr. Pete Glatter.  Her labs demonstrated AKI and the procedure was deferred.  Nephrology is consulted for assistance with management of AKI. Her creatinine has improved slightly from 5.98 on presentation to 5.29.  baseline Cr is variable but most often between 1 - 1.4.  She has been started on normal saline and has been on NS at 100 ml/hr on my exam.  Urology has followed her case as well.  She has a non-obstructing stone and a right ureteral stent with interval dilution of right hydroureteronephrosis.  No obstructive right nephroureterolithiasis.  I spoke with her daughter and granddaughter at bedside.  The patient has been a little more confused recently.  Her daughter thinks that she may have had a UTI and they didn't know it.  Ceftriaxone was ordered on 2/25 but not given.  She wakens with my exam and after a couple of questions says "let me go back to sleep."  Her family laughs and thinks she is getting back to herself.      Intake/Output Summary (Last 24 hours) at 07/15/2023 1730 Last data filed at 07/15/2023 0600 Gross per 24 hour  Intake 3628.48 ml  Output 425 ml  Net 3203.48 ml    Vitals:  Vitals:   07/14/23 1207 07/14/23 2004  07/15/23 0449 07/15/23 1543  BP: 127/63 (!) 125/51 (!) 127/53 (!) 116/53  Pulse: 62 62 69 (!) 53  Resp: 16  16 16   Temp: 98.2 F (36.8 C) 98 F (36.7 C) 97.9 F (36.6 C) (!) 97.5 F (36.4 C)  TempSrc: Oral Oral Oral Oral  SpO2: 98% 99% 97% 100%  Weight:      Height:         Physical Exam:    General:  elderly female in bed in NAD, frail HEENT: NCAT Eyes: EOMI sclera anicteric Neck: supple trachea midline  Heart: S1S2 no rub Lungs: clear to auscultation; normal work of breathing on room air Abdomen: soft/nt/nd Extremities: no edema appreciated; no cyanosis or clubbing Skin: no rash on extremities exposed Neuro: awake and conversant; follows commands Psych normal mood and affect Gu foley in place with yellow urine  Medications reviewed   Labs:     Latest Ref Rng & Units 07/15/2023    7:58 AM 07/14/2023    4:15 AM 07/13/2023    4:21 AM  BMP  Glucose 70 - 99 mg/dL 91  84  93   BUN 8 - 23 mg/dL 45  48  49   Creatinine 0.44 - 1.00 mg/dL 4.09  8.11  9.14   Sodium 135 - 145 mmol/L 139  133  136   Potassium 3.5 - 5.1 mmol/L 3.8  3.9  4.3   Chloride  98 - 111 mmol/L 113  107  108   CO2 22 - 32 mmol/L 18  18  19    Calcium 8.9 - 10.3 mg/dL 8.1  7.8  7.6      Assessment/Plan:   # AKI  - May be pre-renal in the setting of concern for possible infection/confusion.  CK is fine.  Her family does not wish to pursue aggressive measure such as dialysis as they do not feel she will tolerate it and I agree - gradual continued improvement  - Continue supportive care - continue NS at 100 ml/hr   - Appreciate urology - she has a stent currently in place.  Additional procedures per urology discretion    - continue ceftriaxone x 3 days  - nausea supportive care per primary team    # Nephrolithiasis  - non-obstructive per radiology  - per urology   # CKD stage 3 - Baseline Cr 1-1.4   # HTN  - controlled - avoid hypotension    # Metabolic acidosis - on sodium bicarbonate  TID  # Anemia - normocytic - mild and s/p hydration - iron panel is acceptable  - she is on folic acid  Disposition - continue inpatient monitoring.  Gradual improvement with supportive measures    Estanislado Emms, MD 07/15/2023 5:42 PM

## 2023-07-15 NOTE — Plan of Care (Signed)
   Problem: Nutrition: Goal: Adequate nutrition will be maintained Outcome: Progressing   Problem: Coping: Goal: Level of anxiety will decrease Outcome: Progressing   Problem: Elimination: Goal: Will not experience complications related to bowel motility Outcome: Progressing

## 2023-07-15 NOTE — Progress Notes (Signed)
 PROGRESS NOTE  Holly Nunez  NFA:[id number] DOB: 1924/08/22 DOA: 07/12/2023 PCP: Default, Provider, MD   Brief Narrative: Patient is a 89 year old female with history of anemia, osteoarthritis, gout,, hyperlipidemia, hypertension, paroxysmal A-fib ,CKD stage 3 a who was sent by her urologist for the evaluation of acute kidney injury.  She was scheduled for lithotripsy by Dr. Pete Glatter but was found to have acute kidney injury.  On presentation, lab work showed creatinine of 5.9, potassium 4.5.  Patient was admitted for the further management of AKI.  Her baseline creatinine is 1.1-1.4  Nephrology consulted.  Creatinine is still in the range of 5 today.  Assessment & Plan:  Principal Problem:   AKI (acute kidney injury) (HCC) Active Problems:   HTN (hypertension)   Hyperlipidemia   Normocytic anemia   Hyperglycemia   CKD stage 3a, GFR 45-59 ml/min (HCC)   Unspecified atrial fibrillation (HCC)   Cognitive deficits   Hypocalcemia   Hyponatremia   Urolithiasis  AKI on CKD 3a: On reviewing her previous lab work, her baseline creatinine ranges from 1-1.4.  Last creatinine of 0.8 as per 04/17/2023.  Presented with creatinine of 5.8.    CT abdomen/pelvis showed right large renal cyst, unremarkable urinary bladder.  Foley catheter placed.  Nephrology following.  Urine sodium of 62.  Continue IV fluid for now.  Creatinine is still in the range of 5 today.  Appears almost euvolemic or slightly dehydrated. Urine culture showed yeast.  Ceftriaxone discontinued.  NAGMA: Continue  sodium bicarb tablets.  Nephrolithiasis: Follows with urology.  Plan was for a lithotripsy.  Dr. Pete Glatter aware.  Holding the procedure until improvement in the kidney function. CT abdomen/pelvis showed  right ureteral stent , resolution of right hydroureteronephrosis,nonobstructive bilateral 1-3 mm nephrolithiasis.  Hypertension: Currently normotensive.  Hyperlipidemia: Takes Lipitor at home  Normocytic anemia:  Currently hemoglobin stable  Paroxysmal A-fib: On aspirin at home.  Currently on normal sinus rhythm  History of cognitive deficits: History of TIA.  Takes aspirin.  Patient is from nursing facility.  TOC consulted, plan for discharge back to nursing facility when stable.      Pressure Injury 07/12/23 Sacrum Stage 2 -  Partial thickness loss of dermis presenting as a shallow open injury with a red, pink wound bed without slough. open skin, redness (Active)  07/12/23 1842  Location: Sacrum  Location Orientation:   Staging: Stage 2 -  Partial thickness loss of dermis presenting as a shallow open injury with a red, pink wound bed without slough.  Wound Description (Comments): open skin, redness  Present on Admission:   Dressing Type Foam - Lift dressing to assess site every shift 07/14/23 2222    DVT prophylaxis:SCD's Start: 07/12/23 1050     Code Status: Full Code  Family Communication: Daughter [name] at bedside  on 2/28  Patient status:Inpatient  Patient is from :SNF  Anticipated discharge to:SnF  Estimated DC date:not sure, awaiting further improvement in the kidney function   Consultants: Nephrology  Procedures: None  Antimicrobials:  Anti-infectives (From admission, onward)    Start     Dose/Rate Route Frequency Ordered Stop   07/14/23 2000  cefTRIAXone (ROCEPHIN) 1 g in sodium chloride 0.9 % 100 mL IVPB  Status:  Discontinued        1 g 200 mL/hr over 30 Minutes Intravenous Every 24 hours 07/14/23 1814 07/15/23 1023   07/12/23 1049  cefTRIAXone (ROCEPHIN) 1 g in sodium chloride 0.9 % 100 mL IVPB  Status:  Discontinued  1 g 200 mL/hr over 30 Minutes Intravenous 30 min pre-op 07/12/23 1049 07/12/23 1805       Subjective: Patient seen and examined at bedside today.  Hemodynamically stable.  Comfortable lying in bed.  Denies any new complaints today.  Daughter at bedside.  We discussed about keeping her in the hospital until her kidney function  improves.  Objective: Vitals:   07/14/23 0221 07/14/23 1207 07/14/23 2004 07/15/23 0449  BP: 137/72 127/63 (!) 125/51 (!) 127/53  Pulse: 64 62 62 69  Resp: 17 16  16   Temp: 97.7 F (36.5 C) 98.2 F (36.8 C) 98 F (36.7 C) 97.9 F (36.6 C)  TempSrc: Oral Oral Oral Oral  SpO2: 99% 98% 99% 97%  Weight:      Height:        Intake/Output Summary (Last 24 hours) at 07/15/2023 1023 Last data filed at 07/15/2023 0600 Gross per 24 hour  Intake 3748.48 ml  Output 425 ml  Net 3323.48 ml   Filed Weights   07/01/23 1403 07/12/23 1108  Weight: 48.1 kg 48.1 kg    Examination:   General exam: Overall comfortable, not in distress, pleasant elderly female HEENT: PERRL Respiratory system:  no wheezes or crackles  Cardiovascular system: S1 & S2 heard, RRR.  Gastrointestinal system: Abdomen is nondistended, soft and nontender. Central nervous system: Alert and oriented Extremities: No edema, no clubbing ,no cyanosis Skin: No rashes, no ulcers,no icterus  GU: Foley   Data Reviewed: I have personally reviewed following labs and imaging studies  CBC: Recent Labs  Lab 07/12/23 1140 07/13/23 0421 07/14/23 0415  WBC 6.7 5.0 6.0  HGB 11.6* 10.4* 10.3*  HCT 36.1 33.2* 30.5*  MCV 96.5 96.5 91.3  PLT 196 177 193   Basic Metabolic Panel: Recent Labs  Lab 07/12/23 1140 07/13/23 0421 07/14/23 0415 07/15/23 0758  NA 133* 136 133* 139  K 4.5 4.3 3.9 3.8  CL 101 108 107 113*  CO2 20* 19* 18* 18*  GLUCOSE 89 93 84 91  BUN 53* 49* 48* 45*  CREATININE 5.98* 5.29* 5.51* 5.05*  CALCIUM 8.7* 7.6* 7.8* 8.1*     Recent Results (from the past 240 hours)  Urine Culture     Status: Abnormal   Collection Time: 07/12/23  8:56 AM   Specimen: Urine, Clean Catch  Result Value Ref Range Status   Specimen Description   Final    URINE, CLEAN CATCH Performed at [facility], [address], [place], [place] [address]    Special Requests   Final    NONE Performed at  [facility], [address] [place], [place] [address]    Culture 70,000 COLONIES/mL YEAST (A)  Final   Report Status 07/14/2023 FINAL  Final     Radiology Studies: No results found.   Scheduled Meds:  aspirin EC  81 mg Oral Daily   brimonidine  1 drop Both Eyes BID   And   timolol  1 drop Both Eyes BID   Chlorhexidine Gluconate Cloth  6 each Topical Q0600   folic acid  1 mg Oral Daily   latanoprost  1 drop Both Eyes QHS   Muscle Rub  1 Application Topical TID   polyethylene glycol  17 g Oral Daily   senna-docusate  1 tablet Oral BID   sodium bicarbonate  650 mg Oral TID   Continuous Infusions:  sodium chloride 100 mL/hr at 07/15/23 0216     LOS: 3 days  Burnadette Pop, MD [facility] P2/28/2025, 10:23 AM

## 2023-07-16 DIAGNOSIS — N218 Other lower urinary tract calculus: Secondary | ICD-10-CM | POA: Diagnosis not present

## 2023-07-16 DIAGNOSIS — I10 Essential (primary) hypertension: Secondary | ICD-10-CM

## 2023-07-16 DIAGNOSIS — N1831 Chronic kidney disease, stage 3a: Secondary | ICD-10-CM

## 2023-07-16 DIAGNOSIS — L89302 Pressure ulcer of unspecified buttock, stage 2: Secondary | ICD-10-CM

## 2023-07-16 DIAGNOSIS — E7800 Pure hypercholesterolemia, unspecified: Secondary | ICD-10-CM

## 2023-07-16 DIAGNOSIS — L8990 Pressure ulcer of unspecified site, unspecified stage: Secondary | ICD-10-CM | POA: Diagnosis present

## 2023-07-16 DIAGNOSIS — I4891 Unspecified atrial fibrillation: Secondary | ICD-10-CM

## 2023-07-16 DIAGNOSIS — D649 Anemia, unspecified: Secondary | ICD-10-CM

## 2023-07-16 LAB — BASIC METABOLIC PANEL
Anion gap: 8 (ref 5–15)
BUN: 37 mg/dL — ABNORMAL HIGH (ref 8–23)
CO2: 19 mmol/L — ABNORMAL LOW (ref 22–32)
Calcium: 8 mg/dL — ABNORMAL LOW (ref 8.9–10.3)
Chloride: 112 mmol/L — ABNORMAL HIGH (ref 98–111)
Creatinine, Ser: 4.24 mg/dL — ABNORMAL HIGH (ref 0.44–1.00)
GFR, Estimated: 9 mL/min — ABNORMAL LOW (ref >60)
Glucose, Bld: 95 mg/dL (ref 70–99)
Potassium: 3.8 mmol/L (ref 3.5–5.1)
Sodium: 139 mmol/L (ref 135–145)

## 2023-07-16 LAB — SODIUM, URINE, RANDOM: Sodium, Ur: 90 mmol/L

## 2023-07-16 LAB — CREATININE, URINE, RANDOM: Creatinine, Urine: 42 mg/dL

## 2023-07-16 MED ORDER — SODIUM CHLORIDE 0.9 % IV SOLN: INTRAVENOUS | Status: DC

## 2023-07-16 MED ORDER — SODIUM BICARBONATE 650 MG PO TABS
650.0000 mg | ORAL_TABLET | Freq: Three times a day (TID) | ORAL | Status: DC
  Administered 2023-07-16 – 2023-07-20 (×11): 650 mg via ORAL
  Filled 2023-07-16 (×11): qty 1

## 2023-07-16 MED ORDER — SODIUM BICARBONATE 8.4 % IV SOLN
INTRAVENOUS | Status: DC
  Filled 2023-07-16: qty 150

## 2023-07-16 MED ORDER — ATORVASTATIN CALCIUM 20 MG PO TABS
20.0000 mg | ORAL_TABLET | Freq: Every day | ORAL | Status: DC
  Administered 2023-07-17 – 2023-07-20 (×4): 20 mg via ORAL
  Filled 2023-07-16 (×4): qty 1

## 2023-07-16 MED ORDER — ENSURE ENLIVE PO LIQD
237.0000 mL | Freq: Two times a day (BID) | ORAL | Status: DC
  Administered 2023-07-17 (×2): 237 mL via ORAL

## 2023-07-16 NOTE — Assessment & Plan Note (Signed)
 Right ureteral calculus sp stent.  Follow with Urology recommendations.  Need renal function to improve further in order to perform further interventions.

## 2023-07-16 NOTE — Assessment & Plan Note (Addendum)
 Telemetry sinus bradycardia at 50 bpm. Continue close monitoring, patient not on AV blockade.  She is not on full anticoagulation.

## 2023-07-16 NOTE — Progress Notes (Signed)
 [facility] Progress Note  Name: Holly Nunez MRN: [id number] DOB: [birth year]  Chief Complaint:  Planned lithotripsy procedure  Subjective:  She had 550 mL UOP over 2/28.  She has been on normal saline at 100 ml/hr.  Foley has been in place.  One of her daughters is at bedside.  She really wants the tele leads off.  Watching tv.   Review of systems:   Some nausea with eating but did eat breakfast and lunch.   No shortness of breath or chest pain  -------------- Background on consult:  Holly Nunez is a 89 y.o. female with a history of coronary artery disease, hypertension, atrial fibrillation, CKD stage 3, and urinary incontinence who presented to the hospital for a planned lithotripsy with Dr. Pete Glatter.  Her labs demonstrated AKI and the procedure was deferred.  Nephrology is consulted for assistance with management of AKI. Her creatinine has improved slightly from 5.98 on presentation to 5.29.  baseline Cr is variable but most often between 1 - 1.4.  She has been started on normal saline and has been on NS at 100 ml/hr on my exam.  Urology has followed her case as well.  She has a non-obstructing stone and a right ureteral stent with interval dilution of right hydroureteronephrosis.  No obstructive right nephroureterolithiasis.  I spoke with her daughter and granddaughter at bedside.  The patient has been a little more confused recently.  Her daughter thinks that she may have had a UTI and they didn't know it.  Ceftriaxone was ordered on 2/25 but not given.  She wakens with my exam and after a couple of questions says "let me go back to sleep."  Her family laughs and thinks she is getting back to herself.      Intake/Output Summary (Last 24 hours) at 07/16/2023 1720 Last data filed at 07/16/2023 1615 Gross per 24 hour  Intake 3472.02 ml  Output 950 ml  Net 2522.02 ml    Vitals:  Vitals:   07/15/23 1543 07/15/23 2045 07/16/23 0542 07/16/23 1331  BP: (!) 116/53 129/62  (!) 143/69 139/65  Pulse: (!) 53 (!) 54 (!) 57 (!) 52  Resp: 16 (!) 24 17 19   Temp: (!) 97.5 F (36.4 C) 97.9 F (36.6 C) 97.8 F (36.6 C) 98.2 F (36.8 C)  TempSrc: Oral Oral Oral Oral  SpO2: 100% 100% 100% 100%  Weight:      Height:         Physical Exam:    General:  elderly female in bed in NAD, frail HEENT: NCAT Eyes: EOMI sclera anicteric Neck: supple trachea midline  Heart: S1S2 no rub Lungs: clear to auscultation; normal work of breathing on room air Abdomen: soft/nt/nd Extremities: no edema appreciated; no cyanosis or clubbing Skin: no rash on extremities exposed Neuro: awake and conversant; follows commands Psych normal mood and affect Gu foley in place with yellow urine  Medications reviewed   Labs:     Latest Ref Rng & Units 07/16/2023    4:46 AM 07/15/2023    7:58 AM 07/14/2023    4:15 AM  BMP  Glucose 70 - 99 mg/dL 95  91  84   BUN 8 - 23 mg/dL 37  45  48   Creatinine 0.44 - 1.00 mg/dL 0.86  5.78  4.69   Sodium 135 - 145 mmol/L 139  139  133   Potassium 3.5 - 5.1 mmol/L 3.8  3.8  3.9   Chloride 98 - 111 mmol/L 112  113  107   CO2 22 - 32 mmol/L 19  18  18    Calcium 8.9 - 10.3 mg/dL 8.0  8.1  7.8      Assessment/Plan:   # AKI  - May be pre-renal in the setting of concern for possible infection/confusion.  Seems with delayed recovery may have some ATN as well.  CK is fine.  Her family does not wish to pursue aggressive measure such as dialysis as they do not feel she will tolerate it and I agree - gradual continued improvement  - Continue supportive care - stop bicarbonate gtt (was just started late this afternoon).  Resume normal saline and reduce to 75 ml/hr   - Appreciate urology - she has a stent currently in place.  Additional procedures per urology discretion    - she got one dose of ceftriaxone and note this was discontinued     # Nephrolithiasis  - non-obstructive per radiology  - per urology   # CKD stage 3 - Baseline Cr 1-1.4   # HTN   - controlled - avoid hypotension    # Metabolic acidosis - resume sodium bicarbonate TID and stop bicarb gtt  # Anemia - normocytic - mild and s/p hydration - iron panel is acceptable  - she is on folic acid  Disposition - continue inpatient monitoring.  Gradual improvement with supportive measures    Estanislado Emms, MD 07/16/2023 5:36 PM

## 2023-07-16 NOTE — Assessment & Plan Note (Signed)
 Chronic anemia with cell count stable, hgb at 10,3.  Iron panel with serum iron at 70, transferrin saturation 35 with TIBC 203 and ferritin 199.

## 2023-07-16 NOTE — Assessment & Plan Note (Signed)
 Continue blood pressure monitoring.  Systolic blood pressure has been 130 mmHg range.

## 2023-07-16 NOTE — Progress Notes (Addendum)
 Progress Note   Patient: Holly Nunez HQI:[id number] DOB: Apr 01, 1925 DOA: 07/12/2023     4 DOS: the patient was seen and examined on 07/16/2023   Brief hospital course: Mrs. Mctigue was admitted to the hospital with the working diagnosis of acute kidney injury on chronic renal disease  89 year old female with history of urolithiasis, anemia, osteoarthritis, gout,, hyperlipidemia, hypertension, paroxysmal A-fib ,CKD stage 3 a who was sent by her urologist for the evaluation of acute kidney injury.  On presentation, lab work showed creatinine of 5.9, potassium 4.5.    CT abdomen and pelvis with interval enhancement of right ureteral stent with interval dilution of right hydroureteronephrosis.  Non obstructive bilateral 1-3 mm nephrolithiasis.   Patient was placed on supportive medical therapy including IV fluids.   03/01 serum cr is trending down, no hyperkalemia or severe acidosis.   Assessment and Plan: * CKD stage 3a, GFR 45-59 ml/min (HCC) AKI. Hyponatremia   Patient has been placed on isotonic saline at 100 ml per hr, she has a foley catheter and her urine output is 550 cc over last 24 hrs.  Serum cr is 4,24 with K at 3,8 and serum bicarbonate at 19 with anion gap at 8 and Cl 112.   Plan to continue with IV fluids, will change from normal saline to bicarb drip to prevent further acidosis and hyperchloremia.  Check urinary Na and Cr.  Follow up renal function and electrolytes in am. Avoid hypotension and nephrotoxic medications.   Considering patient's comorbid conditions and level of function she is not candidate for renal replacement therapy.   Urolithiasis Right ureteral calculus sp stent.  Follow with Urology recommendations.  Need renal function to improve further in order to perform further interventions.   HTN (hypertension) Continue blood pressure monitoring.  Systolic blood pressure has been 130 mmHg range.   Unspecified atrial fibrillation (HCC) Telemetry sinus  bradycardia at 50 bpm. Continue close monitoring, patient not on AV blockade.  She is not on full anticoagulation.   Hyperlipidemia Continue statin therapy.   Normocytic anemia Chronic anemia with cell count stable, hgb at 10,3.  Iron panel with serum iron at 70, transferrin saturation 35 with TIBC 203 and ferritin 199.   Pressure injury of skin Present on admission, stage 2 sacrum  Patient wheelchair bound.  Continue local care.         Subjective: Patient with no chest pain or dyspnea, no edema, PND or orthopnea.   Physical Exam: Vitals:   07/15/23 1543 07/15/23 2045 07/16/23 0542 07/16/23 1331  BP: (!) 116/53 129/62 (!) 143/69 139/65  Pulse: (!) 53 (!) 54 (!) 57 (!) 52  Resp: 16 (!) 24 17 19   Temp: (!) 97.5 F (36.4 C) 97.9 F (36.6 C) 97.8 F (36.6 C) 98.2 F (36.8 C)  TempSrc: Oral Oral Oral Oral  SpO2: 100% 100% 100% 100%  Weight:      Height:       Neurology awake and alert, deconditioned  ENT with mild pallor with no icterus Cardiovascular with S1 and S2 present and regular with no gallops, or rubs, positive systolic murmur at the right lower sternal border Respiratory with no rales or wheezing, no rhonchi Abdomen with no distention  No lower extremity edema  Data Reviewed:    Family Communication: I spoke with patient's daughter at the bedside, we talked in detail about patient's condition, plan of care and prognosis and all questions were addressed.   Disposition: Status is: Inpatient Remains inpatient appropriate because: IV  fluids   Planned Discharge Destination: Skilled nursing facility     Author: [name], MD 07/16/2023 3:06 PM  For on call review [email or web address].

## 2023-07-16 NOTE — Hospital Course (Addendum)
 Holly Nunez was admitted to the hospital with the working diagnosis of acute kidney injury on chronic renal disease  89 year old female with history of urolithiasis, anemia, osteoarthritis, gout,, hyperlipidemia, hypertension, paroxysmal A-fib ,CKD stage 3 a who was sent by her urologist for the evaluation of acute kidney injury.  On presentation, lab work showed creatinine of 5.9, potassium 4.5.    CT abdomen and pelvis with interval enhancement of right ureteral stent with interval dilution of right hydroureteronephrosis.  Non obstructive bilateral 1-3 mm nephrolithiasis.   Patient was placed on supportive medical therapy including IV fluids.   03/01 serum cr is trending down, no hyperkalemia or severe acidosis.

## 2023-07-16 NOTE — Assessment & Plan Note (Addendum)
 Present on admission, stage 2 sacrum  Patient wheelchair bound.  Continue local care.

## 2023-07-16 NOTE — Assessment & Plan Note (Signed)
 Continue statin therapy.

## 2023-07-16 NOTE — Assessment & Plan Note (Addendum)
 AKI. Hyponatremia   Patient has been placed on isotonic saline at 100 ml per hr, she has a foley catheter and her urine output is 550 cc over last 24 hrs.  Serum cr is 4,24 with K at 3,8 and serum bicarbonate at 19 with anion gap at 8 and Cl 112.   Plan to continue with IV fluids, will change from normal saline to bicarb drip to prevent further acidosis and hyperchloremia.  Check urinary Na and Cr.  Follow up renal function and electrolytes in am. Avoid hypotension and nephrotoxic medications.   Considering patient's comorbid conditions and level of function she is not candidate for renal replacement therapy.

## 2023-07-17 DIAGNOSIS — N1831 Chronic kidney disease, stage 3a: Secondary | ICD-10-CM | POA: Diagnosis not present

## 2023-07-17 LAB — RENAL FUNCTION PANEL
Albumin: 2 g/dL — ABNORMAL LOW (ref 3.5–5.0)
Anion gap: 8 (ref 5–15)
BUN: 34 mg/dL — ABNORMAL HIGH (ref 8–23)
CO2: 18 mmol/L — ABNORMAL LOW (ref 22–32)
Calcium: 8 mg/dL — ABNORMAL LOW (ref 8.9–10.3)
Chloride: 114 mmol/L — ABNORMAL HIGH (ref 98–111)
Creatinine, Ser: 3.61 mg/dL — ABNORMAL HIGH (ref 0.44–1.00)
GFR, Estimated: 11 mL/min — ABNORMAL LOW (ref >60)
Glucose, Bld: 99 mg/dL (ref 70–99)
Phosphorus: 4.1 mg/dL (ref 2.5–4.6)
Potassium: 3.6 mmol/L (ref 3.5–5.1)
Sodium: 140 mmol/L (ref 135–145)

## 2023-07-17 LAB — MAGNESIUM: Magnesium: 1.4 mg/dL — ABNORMAL LOW (ref 1.7–2.4)

## 2023-07-17 MED ORDER — SODIUM CHLORIDE 0.9 % IV SOLN: INTRAVENOUS | Status: AC

## 2023-07-17 NOTE — Plan of Care (Signed)
 Will be NPO after midnight for possible lithotripsy tomorrow.

## 2023-07-17 NOTE — Progress Notes (Signed)
 PROGRESS NOTE    [name]  ZOX:[id number] DOB: [birth year] DOA: 07/12/2023 PCP: Default, Provider, MD    Brief Narrative:   Holly Nunez is a 89 y.o. female with past medical history significant for CAD, HTN,, paroxysmal atrial fibrillation, CKD stage IIIa, nephrolithiasis, anemia of chronic medical/renal disease, wheelchair dependent at baseline who is admitted to [facility] after being found with acute renal failure prior to planned lithotripsy and right ureteral stent exchange on 07/12/2023.  On presentation for day of surgery, lab work showed a creatinine of 5.9, potassium of 4.5.  Anesthesia canceled the case and neurology requested direct admission for further evaluation and management of acute on chronic renal failure.  Assessment & Plan:   Acute renal failure on CKD stage IIIa Metabolic acidosis Patient presented as a direct admission after being found with elevated creatinine prior to planned lithotripsy and ureteral stent exchange by urology on 07/12/2023.  Baseline creatinine 1-1.4, on day of surgery was found to have a creatinine elevated at 5.98.  CT abdomen/pelvis with noted right ureteral stent, interval dilution of right hydroureteronephrosis, nonobstructing bilateral 1-3 mm nephrolithiasis.  -- Nephrology following, appreciate assistance -- Cr 5.98>> 4.24> 3.61 -- Continue NS at 75 mL/h --Sodium bicarbonate 600 mg p.o. 3 times daily -- Avoid nephrotoxins, renally dose all medications -- Repeat BMP in the a.m.  Essential hypertension Paroxysmal atrial fibrillation Not on any rate controlling, antihypertensives, anticoagulants outpatient.  EKG on admission with normal sinus rhythm. -- Continue monitor on telemetry  CAD -- Continue aspirin and statin  Nephrolithiasis -- Urology following, appreciate assistance -- N.p.o. after midnight for planned lithotripsy and ureteral stent exchange on 07/18/2023  DVT prophylaxis: SCD's Start: 07/12/23 1050    Code  Status: Full Code Family Communication: Updated daughter present at bedside this morning  Disposition Plan:  Level of care: Telemetry Status is: Inpatient Remains inpatient appropriate because: IV fluid hydration, pending lithotripsy with ureteral stent exchange by urology tomorrow    Consultants:  Nephrology Urology  Procedures:  None  Antimicrobials:  Ceftriaxone 2/27-2/27   Subjective: Patient seen examined bedside, lying in bed.  Daughter present at bedside.  No complaints this morning.  Creatinine continues to slowly improve.  Currently on schedule for lithotripsy with stent exchange by urology tomorrow.  Ordering breakfast.  No other specific complaints, concerns or questions at this time.  Denies headache, no dizziness, no chest pain, no palpitations, no shortness of breath, no abdominal pain, no fever/chills/night sweats, no nausea cefonicid diarrhea.  No acute events overnight per nursing.  Objective: Vitals:   07/16/23 0542 07/16/23 1331 07/16/23 2100 07/17/23 0252  BP: (!) 143/69 139/65 (!) 129/59 128/78  Pulse: (!) 57 (!) 52 (!) 55 61  Resp: 17 19 14 18   Temp: 97.8 F (36.6 C) 98.2 F (36.8 C) 98 F (36.7 C) 98 F (36.7 C)  TempSrc: Oral Oral  Oral  SpO2: 100% 100% 100% 99%  Weight:      Height:        Intake/Output Summary (Last 24 hours) at 07/17/2023 1133 Last data filed at 07/17/2023 1000 Gross per 24 hour  Intake 3903.38 ml  Output 1150 ml  Net 2753.38 ml   Filed Weights   07/01/23 1403 07/12/23 1108  Weight: 48.1 kg 48.1 kg    Examination:  Physical Exam: GEN: NAD, alert and oriented x 3, elderly appearance HEENT: NCAT, PERRL, EOMI, sclera clear, MMM PULM: CTAB w/o wheezes/crackles, normal respiratory effort, on room air CV: RRR w/o M/G/R GI: abd  soft, NTND, NABS, no R/G/M MSK: no peripheral edema, moves all extremities independently NEURO: CN II-XII intact, no focal deficits, sensation to light touch intact PSYCH: normal  mood/affect Integumentary: No concerning rashes/lesions/wounds noted on exposed skin surfaces    Data Reviewed: I have personally reviewed following labs and imaging studies  CBC: Recent Labs  Lab 07/12/23 1140 07/13/23 0421 07/14/23 0415  WBC 6.7 5.0 6.0  HGB 11.6* 10.4* 10.3*  HCT 36.1 33.2* 30.5*  MCV 96.5 96.5 91.3  PLT 196 177 193   Basic Metabolic Panel: Recent Labs  Lab 07/13/23 0421 07/14/23 0415 07/15/23 0758 07/16/23 0446 07/17/23 0436  NA 136 133* 139 139 140  K 4.3 3.9 3.8 3.8 3.6  CL 108 107 113* 112* 114*  CO2 19* 18* 18* 19* 18*  GLUCOSE 93 84 91 95 99  BUN 49* 48* 45* 37* 34*  CREATININE 5.29* 5.51* 5.05* 4.24* 3.61*  CALCIUM 7.6* 7.8* 8.1* 8.0* 8.0*  MG  --   --   --   --  1.4*  PHOS  --   --   --   --  4.1   GFR: Estimated Creatinine Clearance: 6.5 mL/min (A) (by C-G formula based on SCr of 3.61 mg/dL (H)). Liver Function Tests: Recent Labs  Lab 07/13/23 0421 07/17/23 0436  AST 22  --   ALT 12  --   ALKPHOS 48  --   BILITOT 0.6  --   PROT 5.0*  --   ALBUMIN 2.1* 2.0*   No results for input(s): "LIPASE", "AMYLASE" in the last 168 hours. No results for input(s): "AMMONIA" in the last 168 hours. Coagulation Profile: No results for input(s): "INR", "PROTIME" in the last 168 hours. Cardiac Enzymes: Recent Labs  Lab 07/13/23 0421  CKTOTAL 43   BNP (last 3 results) No results for input(s): "PROBNP" in the last 8760 hours. HbA1C: No results for input(s): "HGBA1C" in the last 72 hours. CBG: No results for input(s): "GLUCAP" in the last 168 hours. Lipid Profile: No results for input(s): "CHOL", "HDL", "LDLCALC", "TRIG", "CHOLHDL", "LDLDIRECT" in the last 72 hours. Thyroid Function Tests: No results for input(s): "TSH", "T4TOTAL", "FREET4", "T3FREE", "THYROIDAB" in the last 72 hours. Anemia Panel: No results for input(s): "VITAMINB12", "FOLATE", "FERRITIN", "TIBC", "IRON", "RETICCTPCT" in the last 72 hours. Sepsis Labs: No results for  input(s): "PROCALCITON", "LATICACIDVEN" in the last 168 hours.  Recent Results (from the past 240 hours)  Urine Culture     Status: Abnormal   Collection Time: 07/12/23  8:56 AM   Specimen: Urine, Clean Catch  Result Value Ref Range Status   Specimen Description   Final    URINE, CLEAN CATCH Performed at [facility], [address], [place], [place] [address]    Special Requests   Final    NONE Performed at [facility], [address], [place], [place] [address]    Culture 70,000 COLONIES/mL YEAST (A)  Final   Report Status 07/14/2023 FINAL  Final         Radiology Studies: No results found.      Scheduled Meds:  aspirin EC  81 mg Oral Daily   atorvastatin  20 mg Oral Daily   brimonidine  1 drop Both Eyes BID   And   timolol  1 drop Both Eyes BID   Chlorhexidine Gluconate Cloth  6 each Topical Q0600   feeding supplement  237 mL Oral BID BM   folic acid  1 mg Oral Daily  latanoprost  1 drop Both Eyes QHS   Muscle Rub  1 Application Topical TID   polyethylene glycol  17 g Oral Daily   senna-docusate  1 tablet Oral BID   sodium bicarbonate  650 mg Oral TID   Continuous Infusions:  sodium chloride 75 mL/hr at 07/17/23 0757     LOS: 5 days    Time spent: 52 minutes spent on chart review, discussion with nursing staff, consultants, updating family and interview/physical exam; more than 50% of that time was spent in counseling and/or coordination of care.    Alvira Philips [place], DO [facility] Available via Epic secure chat 7am-7pm After these hours, please refer to coverage provider listed on [email or web address] 07/17/2023, 11:33 AM

## 2023-07-17 NOTE — Progress Notes (Signed)
 [facility] Progress Note  Name: Holly Nunez MRN: [id number] DOB: 13-Jan-1925  Chief Complaint:  Planned lithotripsy procedure  Subjective:  She had 1.2 liters UOP over 3/1.  She was briefly changed to bicarb gtt on 3/1 afternoon then has been on NS at 75 ml/hr.  Spoke with her daughter at bedside.   Review of systems:    Some nausea this AM No shortness of breath or chest pain  -------------- Background on consult:  Holly Nunez is a 89 y.o. female with a history of coronary artery disease, hypertension, atrial fibrillation, CKD stage 3, and urinary incontinence who presented to the hospital for a planned lithotripsy with Dr. Pete Glatter.  Her labs demonstrated AKI and the procedure was deferred.  Nephrology is consulted for assistance with management of AKI. Her creatinine has improved slightly from 5.98 on presentation to 5.29.  baseline Cr is variable but most often between 1 - 1.4.  She has been started on normal saline and has been on NS at 100 ml/hr on my exam.  Urology has followed her case as well.  She has a non-obstructing stone and a right ureteral stent with interval dilution of right hydroureteronephrosis.  No obstructive right nephroureterolithiasis.  I spoke with her daughter and granddaughter at bedside.  The patient has been a little more confused recently.  Her daughter thinks that she may have had a UTI and they didn't know it.  Ceftriaxone was ordered on 2/25 but not given.  She wakens with my exam and after a couple of questions says "let me go back to sleep."  Her family laughs and thinks she is getting back to herself.      Intake/Output Summary (Last 24 hours) at 07/17/2023 1345 Last data filed at 07/17/2023 1157 Gross per 24 hour  Intake 3903.38 ml  Output 1750 ml  Net 2153.38 ml    Vitals:  Vitals:   07/16/23 1331 07/16/23 2100 07/17/23 0252 07/17/23 1155  BP: 139/65 (!) 129/59 128/78 (!) 138/57  Pulse: (!) 52 (!) 55 61 68  Resp: 19 14 18     Temp: 98.2 F (36.8 C) 98 F (36.7 C) 98 F (36.7 C) 97.8 F (36.6 C)  TempSrc: Oral  Oral Oral  SpO2: 100% 100% 99%   Weight:      Height:         Physical Exam:     General:  elderly female in bed in NAD, frail HEENT: NCAT Eyes: EOMI sclera anicteric Neck: supple trachea midline  Heart: S1S2 no rub Lungs: clear to auscultation; normal work of breathing on room air Abdomen: soft/nt/nd Extremities: no edema appreciated; no cyanosis or clubbing Skin: no rash on extremities exposed Neuro: awake and conversant; follows commands Psych normal mood and affect Gu foley in place with yellow urine  Medications reviewed   Labs:     Latest Ref Rng & Units 07/17/2023    4:36 AM 07/16/2023    4:46 AM 07/15/2023    7:58 AM  BMP  Glucose 70 - 99 mg/dL 99  95  91   BUN 8 - 23 mg/dL 34  37  45   Creatinine 0.44 - 1.00 mg/dL 7.82  9.56  2.13   Sodium 135 - 145 mmol/L 140  139  139   Potassium 3.5 - 5.1 mmol/L 3.6  3.8  3.8   Chloride 98 - 111 mmol/L 114  112  113   CO2 22 - 32 mmol/L 18  19  18    Calcium  8.9 - 10.3 mg/dL 8.0  8.0  8.1      Assessment/Plan:   # AKI  - ATN now in the post-ATN diuresis phase.  Previously with pre-renal insults as well.  CK is fine.  Her family does not wish to pursue aggressive measure such as dialysis as they do not feel she will tolerate it and I agree - Gradual continued improvement  - Continue supportive care - Continue normal saline at 75 ml/hr x 24 hours as ordered this AM and then can likely stop fluids  - Appreciate urology - she has a stent currently in place.  Additional procedures per urology discretion      # Nephrolithiasis  - non-obstructive per radiology  - per urology   # CKD stage 3 - Baseline Cr 1-1.4   # HTN  - controlled - avoid hypotension    # Metabolic acidosis - continue sodium bicarbonate TID  # Anemia - normocytic - mild and s/p hydration - iron panel is acceptable  - she is on folic acid - CBC in  AM  Disposition - continue inpatient monitoring.     Estanislado Emms, MD 07/17/2023 2:02 PM

## 2023-07-18 ENCOUNTER — Encounter (HOSPITAL_COMMUNITY)
Admission: RE | Disposition: A | Discharge status: Skilled Nursing Facility | Payer: Self-pay | Source: Home / Self Care | Attending: Internal Medicine

## 2023-07-18 ENCOUNTER — Encounter (HOSPITAL_COMMUNITY): Payer: Self-pay | Admitting: Internal Medicine

## 2023-07-18 ENCOUNTER — Inpatient Hospital Stay (HOSPITAL_COMMUNITY)

## 2023-07-18 ENCOUNTER — Inpatient Hospital Stay (HOSPITAL_COMMUNITY): Admitting: Anesthesiology

## 2023-07-18 DIAGNOSIS — I4891 Unspecified atrial fibrillation: Secondary | ICD-10-CM | POA: Diagnosis not present

## 2023-07-18 DIAGNOSIS — N201 Calculus of ureter: Secondary | ICD-10-CM | POA: Diagnosis not present

## 2023-07-18 DIAGNOSIS — I10 Essential (primary) hypertension: Secondary | ICD-10-CM | POA: Diagnosis not present

## 2023-07-18 DIAGNOSIS — E785 Hyperlipidemia, unspecified: Secondary | ICD-10-CM

## 2023-07-18 DIAGNOSIS — N179 Acute kidney failure, unspecified: Secondary | ICD-10-CM | POA: Diagnosis not present

## 2023-07-18 DIAGNOSIS — N1831 Chronic kidney disease, stage 3a: Secondary | ICD-10-CM | POA: Diagnosis not present

## 2023-07-18 DIAGNOSIS — Z87440 Personal history of urinary (tract) infections: Secondary | ICD-10-CM | POA: Diagnosis not present

## 2023-07-18 HISTORY — PX: URETEROSCOPY WITH HOLMIUM LASER LITHOTRIPSY: SHX6645

## 2023-07-18 HISTORY — PX: CYSTOSCOPY W/ URETERAL STENT PLACEMENT: SHX1429

## 2023-07-18 LAB — CBC
HCT: 32.8 % — ABNORMAL LOW (ref 36.0–46.0)
Hemoglobin: 10.3 g/dL — ABNORMAL LOW (ref 12.0–15.0)
MCH: 30.1 pg (ref 26.0–34.0)
MCHC: 31.4 g/dL (ref 30.0–36.0)
MCV: 95.9 fL (ref 80.0–100.0)
Platelets: 190 10*3/uL (ref 150–400)
RBC: 3.42 MIL/uL — ABNORMAL LOW (ref 3.87–5.11)
RDW: 14.6 % (ref 11.5–15.5)
WBC: 5.7 10*3/uL (ref 4.0–10.5)
nRBC: 0 % (ref 0.0–0.2)

## 2023-07-18 LAB — BASIC METABOLIC PANEL
Anion gap: 9 (ref 5–15)
BUN: 30 mg/dL — ABNORMAL HIGH (ref 8–23)
CO2: 18 mmol/L — ABNORMAL LOW (ref 22–32)
Calcium: 8 mg/dL — ABNORMAL LOW (ref 8.9–10.3)
Chloride: 115 mmol/L — ABNORMAL HIGH (ref 98–111)
Creatinine, Ser: 3.16 mg/dL — ABNORMAL HIGH (ref 0.44–1.00)
GFR, Estimated: 13 mL/min — ABNORMAL LOW (ref >60)
Glucose, Bld: 94 mg/dL (ref 70–99)
Potassium: 3.5 mmol/L (ref 3.5–5.1)
Sodium: 142 mmol/L (ref 135–145)

## 2023-07-18 SURGERY — URETEROSCOPY, WITH LITHOTRIPSY USING HOLMIUM LASER
Anesthesia: General | Laterality: Right

## 2023-07-18 SURGERY — CYSTOURETEROSCOPY, WITH RETROGRADE PYELOGRAM AND STENT INSERTION
Anesthesia: General | Laterality: Right

## 2023-07-18 MED ORDER — PROPOFOL 10 MG/ML IV BOLUS
INTRAVENOUS | Status: AC
  Filled 2023-07-18: qty 20

## 2023-07-18 MED ORDER — OXYCODONE HCL 5 MG PO TABS: 5.0000 mg | ORAL_TABLET | Freq: Once | ORAL | Status: DC | PRN

## 2023-07-18 MED ORDER — PROPOFOL 10 MG/ML IV BOLUS
INTRAVENOUS | Status: DC | PRN
Start: 2023-07-18 — End: 2023-07-18
  Administered 2023-07-18: 80 mg via INTRAVENOUS

## 2023-07-18 MED ORDER — LIDOCAINE HCL URETHRAL/MUCOSAL 2 % EX GEL
CUTANEOUS | Status: AC
  Filled 2023-07-18: qty 30

## 2023-07-18 MED ORDER — CHLORHEXIDINE GLUCONATE 0.12 % MT SOLN
15.0000 mL | Freq: Once | OROMUCOSAL | Status: DC
  Administered 2023-07-18: 15 mL via OROMUCOSAL

## 2023-07-18 MED ORDER — IOHEXOL 300 MG/ML  SOLN
INTRAMUSCULAR | Status: DC | PRN
  Administered 2023-07-18: 9 mL

## 2023-07-18 MED ORDER — OXYCODONE HCL 5 MG/5ML PO SOLN: 5.0000 mg | Freq: Once | ORAL | Status: DC | PRN

## 2023-07-18 MED ORDER — ONDANSETRON HCL 4 MG/2ML IJ SOLN
INTRAMUSCULAR | Status: AC
  Filled 2023-07-18: qty 2

## 2023-07-18 MED ORDER — CIPROFLOXACIN IN D5W 400 MG/200ML IV SOLN
400.0000 mg | Freq: Once | INTRAVENOUS | Status: AC
  Administered 2023-07-18: 400 mg via INTRAVENOUS
  Filled 2023-07-18: qty 200

## 2023-07-18 MED ORDER — ONDANSETRON HCL 4 MG/2ML IJ SOLN
INTRAMUSCULAR | Status: DC | PRN
  Administered 2023-07-18: 4 mg via INTRAVENOUS

## 2023-07-18 MED ORDER — FENTANYL CITRATE PF 50 MCG/ML IJ SOSY: 25.0000 ug | PREFILLED_SYRINGE | INTRAMUSCULAR | Status: DC | PRN

## 2023-07-18 MED ORDER — LIDOCAINE HCL (CARDIAC) PF 100 MG/5ML IV SOSY
PREFILLED_SYRINGE | INTRAVENOUS | Status: DC | PRN
  Administered 2023-07-18: 40 mg via INTRAVENOUS

## 2023-07-18 MED ORDER — BOOST PLUS PO LIQD
237.0000 mL | Freq: Three times a day (TID) | ORAL | Status: DC
  Administered 2023-07-18 – 2023-07-20 (×5): 237 mL via ORAL
  Filled 2023-07-18 (×7): qty 237

## 2023-07-18 MED ORDER — ADULT MULTIVITAMIN W/MINERALS CH
1.0000 | ORAL_TABLET | Freq: Every day | ORAL | Status: DC
  Administered 2023-07-19 – 2023-07-20 (×2): 1 via ORAL
  Filled 2023-07-18 (×2): qty 1

## 2023-07-18 MED ORDER — FENTANYL CITRATE (PF) 100 MCG/2ML IJ SOLN
INTRAMUSCULAR | Status: DC | PRN
  Administered 2023-07-18: 50 ug via INTRAVENOUS

## 2023-07-18 MED ORDER — PHENYLEPHRINE HCL (PRESSORS) 10 MG/ML IV SOLN
INTRAVENOUS | Status: DC | PRN
Start: 2023-07-18 — End: 2023-07-18
  Administered 2023-07-18: 80 ug via INTRAVENOUS

## 2023-07-18 MED ORDER — EPHEDRINE SULFATE-NACL 50-0.9 MG/10ML-% IV SOSY
PREFILLED_SYRINGE | INTRAVENOUS | Status: DC | PRN
Start: 2023-07-18 — End: 2023-07-18
  Administered 2023-07-18 (×2): 5 mg via INTRAVENOUS

## 2023-07-18 MED ORDER — LIDOCAINE HCL URETHRAL/MUCOSAL 2 % EX GEL
CUTANEOUS | Status: DC | PRN
Start: 2023-07-18 — End: 2023-07-18
  Administered 2023-07-18: 1 via URETHRAL

## 2023-07-18 MED ORDER — ORAL CARE MOUTH RINSE: 15.0000 mL | Freq: Once | OROMUCOSAL | Status: DC

## 2023-07-18 MED ORDER — SODIUM CHLORIDE 0.9 % IR SOLN
Status: DC | PRN
  Administered 2023-07-18: 3000 mL

## 2023-07-18 MED ORDER — FENTANYL CITRATE (PF) 100 MCG/2ML IJ SOLN
INTRAMUSCULAR | Status: AC
  Filled 2023-07-18: qty 2

## 2023-07-18 MED ORDER — SODIUM CHLORIDE 0.9 % IV SOLN: INTRAVENOUS | Status: AC

## 2023-07-18 MED ORDER — LIDOCAINE HCL (PF) 2 % IJ SOLN
INTRAMUSCULAR | Status: AC
  Filled 2023-07-18: qty 5

## 2023-07-18 MED ORDER — EPHEDRINE 5 MG/ML INJ
INTRAVENOUS | Status: AC
  Filled 2023-07-18: qty 5

## 2023-07-18 MED ORDER — SODIUM CHLORIDE 0.9 % IV SOLN: INTRAVENOUS | Status: DC

## 2023-07-18 MED ORDER — DEXAMETHASONE SODIUM PHOSPHATE 10 MG/ML IJ SOLN
INTRAMUSCULAR | Status: AC
Start: 2023-07-18 — End: ?
  Filled 2023-07-18: qty 1

## 2023-07-18 MED ORDER — PHENYLEPHRINE 80 MCG/ML (10ML) SYRINGE FOR IV PUSH (FOR BLOOD PRESSURE SUPPORT)
PREFILLED_SYRINGE | INTRAVENOUS | Status: AC
  Filled 2023-07-18: qty 10

## 2023-07-18 SURGICAL SUPPLY — 21 items
BAG COUNTER SPONGE SURGICOUNT (BAG) IMPLANT
BAG URO CATCHER STRL LF (MISCELLANEOUS) ×1 IMPLANT
BASKET ZERO TIP NITINOL 2.4FR (BASKET) IMPLANT
CATH URETL OPEN END 6FR 70 (CATHETERS) IMPLANT
CLOTH BEACON ORANGE TIMEOUT ST (SAFETY) ×1 IMPLANT
EXTRACTOR STONE 1.7FRX115CM (UROLOGICAL SUPPLIES) IMPLANT
GLOVE SURG LX STRL 7.5 STRW (GLOVE) ×1 IMPLANT
GOWN STRL REUS W/ TWL XL LVL3 (GOWN DISPOSABLE) ×1 IMPLANT
GUIDEWIRE STR DUAL SENSOR (WIRE) ×1 IMPLANT
GUIDEWIRE ZIPWRE .038 STRAIGHT (WIRE) IMPLANT
IV NS 1000ML BAXH (IV SOLUTION) ×1 IMPLANT
KIT TURNOVER KIT A (KITS) IMPLANT
LASER FIB FLEXIVA PULSE ID 365 (Laser) IMPLANT
MANIFOLD NEPTUNE II (INSTRUMENTS) ×1 IMPLANT
PACK CYSTO (CUSTOM PROCEDURE TRAY) ×1 IMPLANT
SHEATH NAVIGATOR HD 11/13X28 (SHEATH) IMPLANT
SHEATH NAVIGATOR HD 12/14X36 (SHEATH) IMPLANT
TRACTIP FLEXIVA PULS ID 200XHI (Laser) IMPLANT
TRACTIP FLEXIVA PULSE ID 200 (Laser) IMPLANT
TUBING CONNECTING 10 (TUBING) ×1 IMPLANT
TUBING UROLOGY SET (TUBING) ×1 IMPLANT

## 2023-07-18 NOTE — Plan of Care (Signed)

## 2023-07-18 NOTE — Anesthesia Procedure Notes (Signed)
 Procedure Name: LMA Insertion Date/Time: 07/18/2023 2:49 PM  Performed by: Doran Clay, CRNAPre-anesthesia Checklist: Patient identified, Emergency Drugs available, Suction available, Patient being monitored and Timeout performed Patient Re-evaluated:Patient Re-evaluated prior to induction Oxygen Delivery Method: Circle system utilized Preoxygenation: Pre-oxygenation with 100% oxygen Induction Type: IV induction LMA: LMA inserted LMA Size: 4.0 Tube type: Oral Number of attempts: 1 Placement Confirmation: positive ETCO2 and breath sounds checked- equal and bilateral Tube secured with: Tape Dental Injury: Teeth and Oropharynx as per pre-operative assessment

## 2023-07-18 NOTE — Progress Notes (Addendum)
 Initial Nutrition Assessment  DOCUMENTATION CODES:   Not applicable  INTERVENTION:   Resume Regular diet after procedure, no dietary restrictions needed.  Boost Plus PO TID, each supplement provides 360 kcal and 14 gm protein. MVI with minerals daily. HS snack daily. Magic cup BID with meals, each supplement provides 290 kcal and 9 grams of protein.  NUTRITION DIAGNOSIS:   Increased nutrient needs related to other (see comment) (suspected malnutrition with recent severe weight loss) as evidenced by estimated needs.  GOAL:   Patient will meet greater than or equal to 90% of their needs  MONITOR:   PO intake, Supplement acceptance  REASON FOR ASSESSMENT:   Consult Assessment of nutrition requirement/status  ASSESSMENT:   89 yo female admitted with AKI on CKD. PMH includes arthritis, HTN, HLD, TIA, CKD, anemia, CAD, dysphagia (pureed diet).  Currently NPO for lithotripsy procedure scheduled for today. Previously on a regular diet consuming 0-100% of meals.   Nephrology is following for AKI, but patient is not a candidate for long term renal replacement therapy.  Spoke with patient's daughter Michaline who reports that patient has lost a lot of weight recently because she requires assistance with feeding, but the nursing home staff has not been assisting her with meals. She is dependent for mobility, wheelchair bound. She has a hx of dysphagia requiring a pureed diet, but that was a long time ago and she is able to eat regular foods now. She likes chocolate Boost supplements as well as peanut butter and jelly sandwiches. Will change to Boost Plus and add a bedtime snack daily.  Labs reviewed. Mag 1.4 Medications reviewed and include folic acid, Miralax, Senokot-S, sodium bicarb tablet. IVF: NS at 75 ml/h  Weight history reviewed. Usual weight 55.8 kg 04/17/23, currently 48.1 kg. 14% weight loss within 3 months is severe. Suspect patient is malnourished. Unable to obtain enough  information at this time for identification of malnutrition.   NUTRITION - FOCUSED PHYSICAL EXAM:  Unable to complete  Diet Order:   Diet Order             Diet NPO time specified  Diet effective midnight                   EDUCATION NEEDS:   Not appropriate for education at this time  Skin:  Skin Assessment: Skin Integrity Issues: Skin Integrity Issues:: Stage II Stage II: sacrum  Last BM:  3/3 type 6  Height:   Ht Readings from Last 1 Encounters:  07/12/23 5' (1.524 m)    Weight:   Wt Readings from Last 1 Encounters:  07/12/23 48.1 kg    Ideal Body Weight:  45.5 kg  BMI:  Body mass index is 20.7 kg/m.  Estimated Nutritional Needs:   Kcal:  1300-1500  Protein:  65-75 gm  Fluid:  >/= 1.5 L   Gabriel Rainwater RD, LDN, CNSC Contact via secure chat. If unavailable, use group chat "RD Inpatient."

## 2023-07-18 NOTE — Anesthesia Postprocedure Evaluation (Signed)
 Anesthesia Post Note  Patient: Aubrianna Orchard  Procedure(s) Performed: URETEROSCOPY WITH HOLMIUM LASER LITHOTRIPSY (Right) CYSTOSCOPY WITH RETROGRADE PYELOGRAM/URETERAL STENT PLACEMENT (Right)     Patient location during evaluation: PACU Anesthesia Type: General Level of consciousness: awake and alert Pain management: pain level controlled Vital Signs Assessment: post-procedure vital signs reviewed and stable Respiratory status: spontaneous breathing, nonlabored ventilation and respiratory function stable Cardiovascular status: blood pressure returned to baseline Postop Assessment: no apparent nausea or vomiting Anesthetic complications: no   No notable events documented.  Last Vitals:  Vitals:   07/18/23 1600 07/18/23 1615  BP: 128/62 (!) 146/71  Pulse: 62 (!) 58  Resp: 14 14  Temp: 36.8 C   SpO2: 100% 100%    Last Pain:  Vitals:   07/18/23 1615  TempSrc:   PainSc: 0-No pain                 Shanda Howells

## 2023-07-18 NOTE — Transfer of Care (Signed)
 Immediate Anesthesia Transfer of Care Note  Patient: Holly Nunez  Procedure(s) Performed: URETEROSCOPY WITH HOLMIUM LASER LITHOTRIPSY (Right) CYSTOSCOPY WITH RETROGRADE PYELOGRAM/URETERAL STENT PLACEMENT (Right)  Patient Location: PACU  Anesthesia Type:General  Level of Consciousness: sedated  Airway & Oxygen Therapy: Patient Spontanous Breathing and Patient connected to nasal cannula oxygen  Post-op Assessment: Report given to RN and Post -op Vital signs reviewed and stable  Post vital signs: Reviewed and stable  Last Vitals:  Vitals Value Taken Time  BP    Temp    Pulse 60 07/18/23 1557  Resp    SpO2 100 % 07/18/23 1557  Vitals shown include unfiled device data.  Last Pain:  Vitals:   07/18/23 1335  TempSrc: Oral  PainSc:       Patients Stated Pain Goal: 0 (07/15/23 1100)  Complications: No notable events documented.

## 2023-07-18 NOTE — Progress Notes (Addendum)
 [facility] Progress Note  Subjective:  I/O yest was 1.4 L in and 1.6 L UOP, creat down to 3.6  Vitals:   07/17/23 0252 07/17/23 1155 07/17/23 2011 07/18/23 0638  BP: 128/78 (!) 138/57 (!) 121/55 (!) 155/77  Pulse: 61 68 63   Resp: 18  18 18   Temp: 98 F (36.7 C) 97.8 F (36.6 C) (!) 97.4 F (36.3 C) 98.3 F (36.8 C)  TempSrc: Oral Oral Oral Oral  SpO2: 99%  100% 100%  Weight:      Height:        Exam: General:  elderly female in bed in NAD, frail HEENT: NCAT Eyes: EOMI sclera anicteric Neck: supple trachea midline  Heart: S1S2 no rub Lungs: clear to auscultation; normal work of breathing on room air Abdomen: soft/nt/nd Extremities: no edema appreciated; no cyanosis or clubbing Skin: no rash on extremities exposed Neuro: awake and conversant; follows commands Psych normal mood and affect Gu foley in place with yellow urine  Summary: 89 yo w/ hx of anemia, DJD, CKD 3, CAD, falls, HL, HTN, OAB, PAF and TIA who was has been f/u urology since nov 2024 when she was found to have a R UPJ stone w/ assoc UTI. She had R ureteral stent placed w/ cystoscopy. She rx'd w/ course of abx for proteus +cx's in blood and urine. In Nov had urology f/b in office and KUB showed the R ureteral stent to be in good position. Urine cx 12/03 showed <10K colonies. F/u UCx on 07/01/23 gre >100K proteus. She was rx'd w/ 10 days of cipro. She presented for cysto, R ureteral laser lithotripsy and poss exchange for R ureteral stent. When labs showed AKI w/ creat 5.8 (baseline 1- 1.4), pt was admitted and renal service was consulted.     CT a/p 2/26 - showed two kidneys w/o hydronephrosis, R ureteral stent in place     Assessment/Plan:   # AKI - ATN now in the post-ATN diuresis phase.  Previously with pre-renal insults as well.  CK is fine.  Her family does not wish to pursue aggressive measure such as dialysis as they do not feel she will tolerate it and I agree. Total I/O's are 12 L in  and 3.8 L UOP since admission. CT showed no obstruction and R ureteral stent in place. Creat peaked at 5.5 and is down to 3.6 yesterday and 3.1 today. Will lower IVF"s to 50 cc/hr and taper off if possible. Will follow.   # Ureteral stenting - appreciate urology, she has a stent currently in place.  Additional procedures planned for this afternoon.   # Volume - mild LE edema, lying flat on RA.    # Nephrolithiasis - non-obstructive per radiology, per urology   # CKD stage 3 - baseline Cr 1-1.4   # HTN - controlled. Avoid hypotension    # Metabolic acidosis - continue sodium bicarbonate TID  # Anemia - normocytic, mild and s/p hydration. Fe panel acceptable, cont on folic acid     Vinson Moselle MD  [facility] 07/18/2023, 12:43 PM  Recent Labs  Lab 07/13/23 0421 07/14/23 0415 07/15/23 0758 07/17/23 0436 07/18/23 0855  HGB 10.4* 10.3*  --   --  10.3*  ALBUMIN 2.1*  --   --  2.0*  --   CALCIUM 7.6* 7.8*   < > 8.0* 8.0*  PHOS  --   --   --  4.1  --   CREATININE 5.29* 5.51*   < >  3.61* 3.16*  K 4.3 3.9   < > 3.6 3.5   < > = values in this interval not displayed.   Recent Labs  Lab 07/14/23 0415  IRON 70  TIBC 203*  FERRITIN 199   Inpatient medications:  aspirin EC  81 mg Oral Daily   atorvastatin  20 mg Oral Daily   brimonidine  1 drop Both Eyes BID   And   timolol  1 drop Both Eyes BID   Chlorhexidine Gluconate Cloth  6 each Topical Q0600   folic acid  1 mg Oral Daily   lactose free nutrition  237 mL Oral TID WC   latanoprost  1 drop Both Eyes QHS   multivitamin with minerals  1 tablet Oral Daily   Muscle Rub  1 Application Topical TID   polyethylene glycol  17 g Oral Daily   senna-docusate  1 tablet Oral BID   sodium bicarbonate  650 mg Oral TID    sodium chloride 75 mL/hr at 07/18/23 0752   acetaminophen **OR** acetaminophen, guaiFENesin-dextromethorphan, nitroGLYCERIN, ondansetron **OR** ondansetron (ZOFRAN) IV

## 2023-07-18 NOTE — Progress Notes (Signed)
 Urology Inpatient Progress Note  Subjective: Patient doing well.  Feeling better.  Good UOP. Renal function improving.  Anti-infectives: Anti-infectives (From admission, onward)    Start     Dose/Rate Route Frequency Ordered Stop   07/14/23 2000  cefTRIAXone (ROCEPHIN) 1 g in sodium chloride 0.9 % 100 mL IVPB  Status:  Discontinued        1 g 200 mL/hr over 30 Minutes Intravenous Every 24 hours 07/14/23 1814 07/15/23 1023   07/12/23 1049  cefTRIAXone (ROCEPHIN) 1 g in sodium chloride 0.9 % 100 mL IVPB  Status:  Discontinued        1 g 200 mL/hr over 30 Minutes Intravenous 30 min pre-op 07/12/23 1049 07/12/23 1805       Current Facility-Administered Medications  Medication Dose Route Frequency Provider Last Rate Last Admin   0.9 %  sodium chloride infusion   Intravenous Continuous [place], Eric J, DO 75 mL/hr at 07/18/23 0752 New Bag at 07/18/23 0752   [MAR Hold] acetaminophen (TYLENOL) tablet 650 mg  650 mg Oral Q6H PRN Bobette Mo, MD       Or   [facility] Hold] acetaminophen (TYLENOL) suppository 650 mg  650 mg Rectal Q6H PRN Bobette Mo, MD       [facility] Hold] aspirin EC tablet 81 mg  81 mg Oral Daily Bobette Mo, MD   81 mg at 07/18/23 1003   [MAR Hold] atorvastatin (LIPITOR) tablet 20 mg  20 mg Oral Daily Arrien, York Ram, MD   20 mg at 07/18/23 1003   [MAR Hold] brimonidine (ALPHAGAN) 0.2 % ophthalmic solution 1 drop  1 drop Both Eyes BID Bobette Mo, MD   1 drop at 07/18/23 1005   And   [MAR Hold] timolol (TIMOPTIC) 0.5 % ophthalmic solution 1 drop  1 drop Both Eyes BID Bobette Mo, MD   1 drop at 07/18/23 1005   [MAR Hold] Chlorhexidine Gluconate Cloth 2 % PADS 6 each  6 each Topical Q0600 Burnadette Pop, MD   6 each at 07/18/23 0525   [MAR Hold] folic acid (FOLVITE) tablet 1 mg  1 mg Oral Daily Bobette Mo, MD   1 mg at 07/18/23 1003   [MAR Hold] guaiFENesin-dextromethorphan (ROBITUSSIN DM) 100-10 MG/5ML syrup 10 mL  10 mL Oral  Q6H PRN Bobette Mo, MD       [facility] Hold] lactose free nutrition (BOOST PLUS) liquid 237 mL  237 mL Oral TID WC [place], Eric J, DO       [MAR Hold] latanoprost (XALATAN) 0.005 % ophthalmic solution 1 drop  1 drop Both Eyes QHS Bobette Mo, MD   1 drop at 07/17/23 2106   [facility] Hold] multivitamin with minerals tablet 1 tablet  1 tablet Oral Daily [place], Alvira Philips, DO       [facility] Hold] Muscle Rub CREA 1 Application  1 Application Topical TID Bobette Mo, MD   1 Application at 07/18/23 1007   [MAR Hold] nitroGLYCERIN (NITROSTAT) SL tablet 0.4 mg  0.4 mg Sublingual Q5 min PRN Bobette Mo, MD       [facility] Hold] ondansetron [facility]) tablet 4 mg  4 mg Oral Q6H PRN Bobette Mo, MD       Or   [facility] Hold] ondansetron [facility]) injection 4 mg  4 mg Intravenous Q6H PRN Bobette Mo, MD   4 mg at 07/18/23 0353   [facility] Hold] polyethylene glycol (MIRALAX / GLYCOLAX) packet 17 g  17 g Oral Daily Burnadette Pop, MD   17 g at 07/18/23 1003   [MAR Hold] senna-docusate (Senokot-S) tablet 1 tablet  1 tablet Oral BID Burnadette Pop, MD   1 tablet at 07/18/23 1003   [MAR Hold] sodium bicarbonate tablet 650 mg  650 mg Oral TID Estanislado Emms, MD   650 mg at 07/18/23 1002     Objective: Vital signs in last 24 hours: Temp:  [97.4 F (36.3 C)-98.8 F (37.1 C)] 98.8 F (37.1 C) (03/03 1335) Pulse Rate:  [60-63] 60 (03/03 1335) Resp:  [16-18] 16 (03/03 1335) BP: (121-155)/(55-77) 135/61 (03/03 1335) SpO2:  [98 %-100 %] 98 % (03/03 1335)  Intake/Output from previous day: 03/02 0701 - 03/03 0700 In: 1712.6 [P.O.:120; I.V.:1592.6] Out: 850 [Urine:850] Intake/Output this shift: Total I/O In: 292.4 [I.V.:292.4] Out: 100 [Urine:100]  GENERAL APPEARANCE:  Well appearing, well developed, well nourished, NAD HEENT:  Atraumatic, normocephalic, oropharynx clear NECK:  Supple  ABDOMEN:  Soft, non-tender, no masses EXTREMITIES:  Moves all extremities well, without clubbing,  cyanosis, or edema NEUROLOGIC:  Alert and oriented x 3, CN II-XII grossly intact MENTAL STATUS:  appropriate BACK:  Non-tender to palpation, No CVAT SKIN:  Warm, dry, and intact   Lab Results:  Recent Labs    07/18/23 0855  WBC 5.7  HGB 10.3*  HCT 32.8*  PLT 190   BMET Recent Labs    07/17/23 0436 07/18/23 0855  NA 140 142  K 3.6 3.5  CL 114* 115*  CO2 18* 18*  GLUCOSE 99 94  BUN 34* 30*  CREATININE 3.61* 3.16*  CALCIUM 8.0* 8.0*   PT/INR No results for input(s): "LABPROT", "INR" in the last 72 hours. ABG No results for input(s): "PHART", "HCO3" in the last 72 hours.  Invalid input(s): "PCO2", "PO2"  Studies/Results: No results found.   Assessment & Plan: AKI - resolving Right ureteral calculus History of UTI  Given continued improvement in renal function, will proceed with cystoscopy, right ureteroscopy with laser lithotripsy, exchange/removal of right ureteral stent. Plan discussed with the patient and her daughter.  They agree and wish to proceed. Informed consent obtained.  Di Kindle, MD 07/18/2023

## 2023-07-18 NOTE — Anesthesia Preprocedure Evaluation (Addendum)
 Anesthesia Evaluation  Patient identified by MRN, date of birth, ID band Patient awake    Reviewed: Allergy & Precautions, NPO status , Patient's Chart, lab work & pertinent test results  History of Anesthesia Complications Negative for: history of anesthetic complications  Airway Mallampati: II  TM Distance: >3 FB Neck ROM: Full    Dental  (+) Missing,    Pulmonary neg pulmonary ROS   Pulmonary exam normal        Cardiovascular hypertension, Normal cardiovascular exam+ dysrhythmias Atrial Fibrillation      Neuro/Psych TIA negative psych ROS   GI/Hepatic negative GI ROS, Neg liver ROS,,,  Endo/Other  negative endocrine ROS    Renal/GU Renal Insufficiency and ARFRenal disease (Right ureteral calculus)  negative genitourinary   Musculoskeletal  (+) Arthritis ,    Abdominal   Peds  Hematology  (+) Blood dyscrasia, anemia   Anesthesia Other Findings Day of surgery medications reviewed with patient.  Reproductive/Obstetrics negative OB ROS                             Anesthesia Physical Anesthesia Plan  ASA: 3  Anesthesia Plan: General   Post-op Pain Management:    Induction: Intravenous  PONV Risk Score and Plan: 3 and Treatment may vary due to age or medical condition and Ondansetron  Airway Management Planned: LMA  Additional Equipment: None  Intra-op Plan:   Post-operative Plan: Extubation in OR  Informed Consent: I have reviewed the patients History and Physical, chart, labs and discussed the procedure including the risks, benefits and alternatives for the proposed anesthesia with the patient or authorized representative who has indicated his/her understanding and acceptance.     Dental advisory given  Plan Discussed with: CRNA  Anesthesia Plan Comments:         Anesthesia Quick Evaluation

## 2023-07-18 NOTE — Op Note (Signed)
 OPERATIVE NOTE   Patient Name: Holly Nunez  MRN: [id number]   Date of Procedure: 07/18/23    Preoperative diagnosis:  Right ureteral calculus History of UTI AKI  Postoperative diagnosis:  Right renal calculi History of UTI AKI  Procedure:  Cystoscopy Right retrograde pyelogram with intra-operative interpretation Right ureteroscopy with laser lithotripsy Removal of right ureteral stent  Attending: Milderd Meager, MD  Anesthesia: General  Estimated blood loss: None  Fluids: Per anesthesia record  Drains: None  Specimens: Stone fragments  Antibiotics: Cipro 400 mg IV  Findings: Normal urethra and bladder; right ureteral stent with some encrustation distally; cluster of small calculi in right renal pelvis with with associated matrix material  Indications:  89 year old female found to have a right ureteral calculus and UTI on presentation in November 2024.  CT imaging demonstrated a 9 mm right UPJ stone with associated UTI.  Urine culture and blood cultures grew Proteus.  She was treated with appropriate antibiotics.  Follow-up KUB on 04/04/2023 demonstrated a right ureteral stent in good position with a 7 mm calcification noted at the L3-L4 level alongside the stent.  She was scheduled for right ureteroscopy but the procedure was delayed due to need for cardiology clearance.  She was also found to have a Proteus UTI in February 2025.  This was treated with appropriate antibiotics.  She presented to the hospital for surgical management on 07/12/2023.  Preop labs showed a creatinine of 5.98.  The procedure was canceled and she was admitted to the hospital for further evaluation.  She has remained in the hospital and is on gentle hydration.  Her creatinine has improved to 3.1 today.  CT imaging from 07/13/2023 showed a right ureteral stent in good position with resolution of right hydronephrosis and some 1-3 mm nonobstructing right right renal calculi.  She presents now for  surgical management of the right ureteral calculus with cystoscopy, right ureteroscopy with possible laser lithotripsy and exchange/removal of the right ureteral stent.  The procedure including potential risk has been discussed with the patient and her family in detail.  They understand and wish to proceed as described.  Description of Procedure:  The patient received IV Cipro preoperatively.  She was taken to the operating room suite and properly identified.  After successful induction of a general anesthetic, she was placed in the dorsolithotomy position.  The Foley catheter was removed.  The patient's genital areas prepped and draped in sterile fashion.   Scout film was obtained and showed the right ureteral stent in good position without any obvious encrustation proximally.  A nonspecific bowel gas pattern was noted.  Under direct visualization a 21 French rigid cystoscope was passed through the urethra into the bladder.  The right ureteral stent was seen emanating from the right ureteral orifice.  There was some encrustation of the distal aspect of the stent.  A sensor guidewire was then passed alongside the stent and into the right renal pelvis under fluoroscopic guidance.  Using stent graspers, the right ureteral stent was removed without difficulty.  Inspection of the stent demonstrated no evidence of encrustation proximally.  A semirigid ureteroscope was then passed alongside the guidewire and into the right ureter.  Contrast was injected through the ureteroscope to perform a retrograde pyelogram.  No obvious filling defect was noted within the ureter or right renal pelvis.  The right ureter was quite dilated due to the stent having been in place for 4 months.  Inspection of the ureter showed no calculi within  the right ureter.  A second sensor wire was then passed through the ureteroscope and into the right renal pelvis.  A 11/13 French access sheath was then passed into the right ureter over the  second guidewire.  Flexible ureteroscopy was then performed through the access sheath and alongside the guidewire.  There were some very small calculi noted in the right renal pelvis.  A cluster of stones with some fibrinous type material was noted as well.  Using the holmium laser fiber on a dusting setting, the cluster of stones was completely fragmented.  A engage stone basket was then used to retrieve some of the remaining fragments.  All remaining fragments were quite small and felt to be easily passable.  The ureter was inspected with removal of the ureteroscope and sheath.  There was no evidence of any ureteral injury.  Again the right ureter was quite dilated.  I elected not to replace a ureteral stent due to the ureteral dilation and the difficulty with transporting the patient to the office for possible stent removal.  The bladder was then drained.  The patient received intraurethral lidocaine jelly.  She was then extubated and taken to the post anesthesia care unit in stable condition.  Complications: None  Condition: Stable, extubated, transferred to PACU  Plan:  Follow renal function Disposition per Medicine

## 2023-07-18 NOTE — Progress Notes (Signed)
 Received pt from [facility] at 03:20 AM.

## 2023-07-18 NOTE — Progress Notes (Signed)
 PROGRESS NOTE    Holly Nunez  ZOX:[id number] DOB: [birth year] DOA: 07/12/2023 PCP: Default, Provider, MD    Brief Narrative:   Holly Nunez is a 89 y.o. female with past medical history significant for CAD, HTN,, paroxysmal atrial fibrillation, CKD stage IIIa, nephrolithiasis, anemia of chronic medical/renal disease, wheelchair dependent at baseline who is admitted to [facility] after being found with acute renal failure prior to planned lithotripsy and right ureteral stent exchange on 07/12/2023.  On presentation for day of surgery, lab work showed a creatinine of 5.9, potassium of 4.5.  Anesthesia canceled the case and neurology requested direct admission for further evaluation and management of acute on chronic renal failure.  Assessment & Plan:   Acute renal failure on CKD stage IIIa Metabolic acidosis Patient presented as a direct admission after being found with elevated creatinine prior to planned lithotripsy and ureteral stent exchange by urology on 07/12/2023.  Baseline creatinine 1-1.4, on day of surgery was found to have a creatinine elevated at 5.98.  CT abdomen/pelvis with noted right ureteral stent, interval dilution of right hydroureteronephrosis, nonobstructing bilateral 1-3 mm nephrolithiasis.  -- Nephrology following, appreciate assistance -- Cr 5.98>> 4.24> 3.61>3.16 -- Continue NS at 75 mL/h -- Sodium bicarbonate 650 mg p.o. 3 times daily -- Avoid nephrotoxins, renally dose all medications -- Repeat BMP in the a.m.  Essential hypertension Paroxysmal atrial fibrillation Not on any rate controlling, antihypertensives, anticoagulants outpatient.  EKG on admission with normal sinus rhythm. -- Continue monitor on telemetry  CAD -- Continue aspirin and statin  Nephrolithiasis -- Urology following, appreciate assistance -- N.p.o. for planned lithotripsy and ureteral stent exchange this afternoon  DVT prophylaxis: SCD's Start: 07/12/23 1050    Code  Status: Full Code Family Communication: Updated patient's niece present at bedside this morning  Disposition Plan:  Level of care: Telemetry Status is: Inpatient Remains inpatient appropriate because: IV fluid hydration, pending lithotripsy with ureteral stent exchange by urology today    Consultants:  Nephrology Urology  Procedures:  None  Antimicrobials:  Ceftriaxone 2/27-2/27   Subjective: Patient seen examined bedside, lying in bed.  Niece present at bedside.  No complaints this morning.  Creatinine continues to slowly improve.  Currently on schedule for lithotripsy with stent exchange by urology today.  No specific concerns or questions at this time.  Denies headache, no dizziness, no chest pain, no palpitations, no shortness of breath, no abdominal pain, no fever/chills/night sweats, no nausea/vomiting/diarrhea.  No acute events overnight per nursing staff.  Objective: Vitals:   07/17/23 0252 07/17/23 1155 07/17/23 2011 07/18/23 0638  BP: 128/78 (!) 138/57 (!) 121/55 (!) 155/77  Pulse: 61 68 63   Resp: 18  18 18   Temp: 98 F (36.7 C) 97.8 F (36.6 C) (!) 97.4 F (36.3 C) 98.3 F (36.8 C)  TempSrc: Oral Oral Oral Oral  SpO2: 99%  100% 100%  Weight:      Height:        Intake/Output Summary (Last 24 hours) at 07/18/2023 1205 Last data filed at 07/18/2023 0754 Gross per 24 hour  Intake 1752.52 ml  Output 350 ml  Net 1402.52 ml   Filed Weights   07/01/23 1403 07/12/23 1108  Weight: 48.1 kg 48.1 kg    Examination:  Physical Exam: GEN: NAD, alert and oriented x 3, elderly appearance HEENT: NCAT, PERRL, EOMI, sclera clear, MMM PULM: CTAB w/o wheezes/crackles, normal respiratory effort, on room air CV: RRR w/o M/G/R GI: abd soft, NTND, NABS, no R/G/M MSK: no  peripheral edema, moves all extremities independently NEURO: CN II-XII intact, no focal deficits, sensation to light touch intact PSYCH: normal mood/affect Integumentary: No concerning  rashes/lesions/wounds noted on exposed skin surfaces    Data Reviewed: I have personally reviewed following labs and imaging studies  CBC: Recent Labs  Lab 07/12/23 1140 07/13/23 0421 07/14/23 0415 07/18/23 0855  WBC 6.7 5.0 6.0 5.7  HGB 11.6* 10.4* 10.3* 10.3*  HCT 36.1 33.2* 30.5* 32.8*  MCV 96.5 96.5 91.3 95.9  PLT 196 177 193 190   Basic Metabolic Panel: Recent Labs  Lab 07/14/23 0415 07/15/23 0758 07/16/23 0446 07/17/23 0436 07/18/23 0855  NA 133* 139 139 140 142  K 3.9 3.8 3.8 3.6 3.5  CL 107 113* 112* 114* 115*  CO2 18* 18* 19* 18* 18*  GLUCOSE 84 91 95 99 94  BUN 48* 45* 37* 34* 30*  CREATININE 5.51* 5.05* 4.24* 3.61* 3.16*  CALCIUM 7.8* 8.1* 8.0* 8.0* 8.0*  MG  --   --   --  1.4*  --   PHOS  --   --   --  4.1  --    GFR: Estimated Creatinine Clearance: 7.5 mL/min (A) (by C-G formula based on SCr of 3.16 mg/dL (H)). Liver Function Tests: Recent Labs  Lab 07/13/23 0421 07/17/23 0436  AST 22  --   ALT 12  --   ALKPHOS 48  --   BILITOT 0.6  --   PROT 5.0*  --   ALBUMIN 2.1* 2.0*   No results for input(s): "LIPASE", "AMYLASE" in the last 168 hours. No results for input(s): "AMMONIA" in the last 168 hours. Coagulation Profile: No results for input(s): "INR", "PROTIME" in the last 168 hours. Cardiac Enzymes: Recent Labs  Lab 07/13/23 0421  CKTOTAL 43   BNP (last 3 results) No results for input(s): "PROBNP" in the last 8760 hours. HbA1C: No results for input(s): "HGBA1C" in the last 72 hours. CBG: No results for input(s): "GLUCAP" in the last 168 hours. Lipid Profile: No results for input(s): "CHOL", "HDL", "LDLCALC", "TRIG", "CHOLHDL", "LDLDIRECT" in the last 72 hours. Thyroid Function Tests: No results for input(s): "TSH", "T4TOTAL", "FREET4", "T3FREE", "THYROIDAB" in the last 72 hours. Anemia Panel: No results for input(s): "VITAMINB12", "FOLATE", "FERRITIN", "TIBC", "IRON", "RETICCTPCT" in the last 72 hours. Sepsis Labs: No results for  input(s): "PROCALCITON", "LATICACIDVEN" in the last 168 hours.  Recent Results (from the past 240 hours)  Urine Culture     Status: Abnormal   Collection Time: 07/12/23  8:56 AM   Specimen: Urine, Clean Catch  Result Value Ref Range Status   Specimen Description   Final    URINE, CLEAN CATCH Performed at [facility], [address] [place], [place] [address]    Special Requests   Final    NONE Performed at [facility], [address] [place], [place] [address]    Culture 70,000 COLONIES/mL YEAST (A)  Final   Report Status 07/14/2023 FINAL  Final         Radiology Studies: No results found.      Scheduled Meds:  aspirin EC  81 mg Oral Daily   atorvastatin  20 mg Oral Daily   brimonidine  1 drop Both Eyes BID   And   timolol  1 drop Both Eyes BID   Chlorhexidine Gluconate Cloth  6 each Topical Q0600   folic acid  1 mg Oral Daily   lactose free nutrition  237 mL Oral TID  WC   latanoprost  1 drop Both Eyes QHS   multivitamin with minerals  1 tablet Oral Daily   Muscle Rub  1 Application Topical TID   polyethylene glycol  17 g Oral Daily   senna-docusate  1 tablet Oral BID   sodium bicarbonate  650 mg Oral TID   Continuous Infusions:  sodium chloride 75 mL/hr at 07/18/23 0752     LOS: 6 days    Time spent: 49 minutes spent on chart review, discussion with nursing staff, consultants, updating family and interview/physical exam; more than 50% of that time was spent in counseling and/or coordination of care.    Alvira Philips [place], DO [facility] Available via Epic secure chat 7am-7pm After these hours, please refer to coverage provider listed on [email or web address] 07/18/2023, 12:05 PM

## 2023-07-19 ENCOUNTER — Encounter (HOSPITAL_COMMUNITY): Payer: Self-pay | Admitting: Urology

## 2023-07-19 DIAGNOSIS — N1831 Chronic kidney disease, stage 3a: Secondary | ICD-10-CM | POA: Diagnosis not present

## 2023-07-19 LAB — BASIC METABOLIC PANEL
Anion gap: 11 (ref 5–15)
BUN: 26 mg/dL — ABNORMAL HIGH (ref 8–23)
CO2: 20 mmol/L — ABNORMAL LOW (ref 22–32)
Calcium: 7.7 mg/dL — ABNORMAL LOW (ref 8.9–10.3)
Chloride: 109 mmol/L (ref 98–111)
Creatinine, Ser: 2.7 mg/dL — ABNORMAL HIGH (ref 0.44–1.00)
GFR, Estimated: 16 mL/min — ABNORMAL LOW (ref >60)
Glucose, Bld: 137 mg/dL — ABNORMAL HIGH (ref 70–99)
Potassium: 3.7 mmol/L (ref 3.5–5.1)
Sodium: 140 mmol/L (ref 135–145)

## 2023-07-19 NOTE — Progress Notes (Signed)
 PROGRESS NOTE    Holly Nunez  ZOX:[id number] DOB: 1924/06/22 DOA: 07/12/2023 PCP: Default, Provider, MD    Brief Narrative:   Holly Nunez is a 89 y.o. female with past medical history significant for CAD, HTN,, paroxysmal atrial fibrillation, CKD stage IIIa, nephrolithiasis, anemia of chronic medical/renal disease, wheelchair dependent at baseline who is admitted to [facility] after being found with acute renal failure prior to planned lithotripsy and right ureteral stent exchange on 07/12/2023.  On presentation for day of surgery, lab work showed a creatinine of 5.9, potassium of 4.5.  Anesthesia canceled the case and neurology requested direct admission for further evaluation and management of acute on chronic renal failure.  Assessment & Plan:   Acute renal failure on CKD stage IIIa Metabolic acidosis Patient presented as a direct admission after being found with elevated creatinine prior to planned lithotripsy and ureteral stent exchange by urology on 07/12/2023.  Baseline creatinine 1-1.4, on day of surgery was found to have a creatinine elevated at 5.98.  CT abdomen/pelvis with noted right ureteral stent, interval dilution of right hydroureteronephrosis, nonobstructing bilateral 1-3 mm nephrolithiasis.  -- Nephrology following, appreciate assistance -- Cr 5.98>> 4.24> 3.61>3.16>2.70 -- IV fluids discontinued today -- Sodium bicarbonate 650 mg p.o. 3 times daily -- Avoid nephrotoxins, renally dose all medications -- Repeat BMP in the a.m.  Essential hypertension Paroxysmal atrial fibrillation Not on any rate controlling, antihypertensives, anticoagulants outpatient.  EKG on admission with normal sinus rhythm. -- Continue monitor on telemetry  CAD -- Continue aspirin and statin  Nephrolithiasis Patient underwent lithotripsy with right ureteral stent removal on 07/18/2023 by Dr. Pete Glatter.  DVT prophylaxis: SCD's Start: 07/12/23 1050    Code Status: Full  Code Family Communication: Updated family present at bedside this morning  Disposition Plan:  Level of care: Telemetry Status is: Inpatient Remains inpatient appropriate because: If creatinine remains stable tomorrow, anticipate able to discharge home    Consultants:  Nephrology Urology  Procedures:  None  Antimicrobials:  Ceftriaxone 2/27-2/27   Subjective: Patient seen examined bedside, lying in bed.  Family present at bedside.  No complaints this morning.  Underwent lithotripsy with ureteral stent removal yesterday by Dr. Pete Glatter.  Creatinine continues to improve.  Discontinued IV fluids today.  No specific concerns or questions at this time.  Denies headache, no dizziness, no chest pain, no palpitations, no shortness of breath, no abdominal pain, no fever/chills/night sweats, no nausea/vomiting/diarrhea.  No acute events overnight per nursing staff.  Objective: Vitals:   07/18/23 1645 07/18/23 2314 07/19/23 0619 07/19/23 1000  BP: (!) 141/65 (!) 121/55 (!) 104/54   Pulse: 64 (!) 59 (!) 109   Resp: 15 20 16    Temp:  98 F (36.7 C) 98.1 F (36.7 C)   TempSrc:   Oral   SpO2: 98% 99% 100%   Weight:    51.1 kg  Height:    5' (1.524 m)    Intake/Output Summary (Last 24 hours) at 07/19/2023 1243 Last data filed at 07/19/2023 0915 Gross per 24 hour  Intake 1829.22 ml  Output 500 ml  Net 1329.22 ml   Filed Weights   07/01/23 1403 07/12/23 1108 07/19/23 1000  Weight: 48.1 kg 48.1 kg 51.1 kg    Examination:  Physical Exam: GEN: NAD, alert and oriented x 3, elderly appearance HEENT: NCAT, PERRL, EOMI, sclera clear, MMM PULM: CTAB w/o wheezes/crackles, normal respiratory effort, on room air CV: RRR w/o M/G/R GI: abd soft, NTND, NABS, no R/G/M MSK: no peripheral edema,  moves all extremities independently NEURO: CN II-XII intact, no focal deficits, sensation to light touch intact PSYCH: normal mood/affect Integumentary: No concerning rashes/lesions/wounds noted on  exposed skin surfaces    Data Reviewed: I have personally reviewed following labs and imaging studies  CBC: Recent Labs  Lab 07/13/23 0421 07/14/23 0415 07/18/23 0855  WBC 5.0 6.0 5.7  HGB 10.4* 10.3* 10.3*  HCT 33.2* 30.5* 32.8*  MCV 96.5 91.3 95.9  PLT 177 193 190   Basic Metabolic Panel: Recent Labs  Lab 07/15/23 0758 07/16/23 0446 07/17/23 0436 07/18/23 0855 07/19/23 0434  NA 139 139 140 142 140  K 3.8 3.8 3.6 3.5 3.7  CL 113* 112* 114* 115* 109  CO2 18* 19* 18* 18* 20*  GLUCOSE 91 95 99 94 137*  BUN 45* 37* 34* 30* 26*  CREATININE 5.05* 4.24* 3.61* 3.16* 2.70*  CALCIUM 8.1* 8.0* 8.0* 8.0* 7.7*  MG  --   --  1.4*  --   --   PHOS  --   --  4.1  --   --    GFR: Estimated Creatinine Clearance: 8.8 mL/min (A) (by C-G formula based on SCr of 2.7 mg/dL (H)). Liver Function Tests: Recent Labs  Lab 07/13/23 0421 07/17/23 0436  AST 22  --   ALT 12  --   ALKPHOS 48  --   BILITOT 0.6  --   PROT 5.0*  --   ALBUMIN 2.1* 2.0*   No results for input(s): "LIPASE", "AMYLASE" in the last 168 hours. No results for input(s): "AMMONIA" in the last 168 hours. Coagulation Profile: No results for input(s): "INR", "PROTIME" in the last 168 hours. Cardiac Enzymes: Recent Labs  Lab 07/13/23 0421  CKTOTAL 43   BNP (last 3 results) No results for input(s): "PROBNP" in the last 8760 hours. HbA1C: No results for input(s): "HGBA1C" in the last 72 hours. CBG: No results for input(s): "GLUCAP" in the last 168 hours. Lipid Profile: No results for input(s): "CHOL", "HDL", "LDLCALC", "TRIG", "CHOLHDL", "LDLDIRECT" in the last 72 hours. Thyroid Function Tests: No results for input(s): "TSH", "T4TOTAL", "FREET4", "T3FREE", "THYROIDAB" in the last 72 hours. Anemia Panel: No results for input(s): "VITAMINB12", "FOLATE", "FERRITIN", "TIBC", "IRON", "RETICCTPCT" in the last 72 hours. Sepsis Labs: No results for input(s): "PROCALCITON", "LATICACIDVEN" in the last 168  hours.  Recent Results (from the past 240 hours)  Urine Culture     Status: Abnormal   Collection Time: 07/12/23  8:56 AM   Specimen: Urine, Clean Catch  Result Value Ref Range Status   Specimen Description   Final    URINE, CLEAN CATCH Performed at [facility], [address] [place], [place] [address]    Special Requests   Final    NONE Performed at [facility], [address] [place], [place] [address]    Culture 70,000 COLONIES/mL YEAST (A)  Final   Report Status 07/14/2023 FINAL  Final         Radiology Studies: DG C-Arm 1-60 Min-No Report Result Date: 07/18/2023 Fluoroscopy was utilized by the requesting physician.  No radiographic interpretation.        Scheduled Meds:  aspirin EC  81 mg Oral Daily   atorvastatin  20 mg Oral Daily   brimonidine  1 drop Both Eyes BID   And   timolol  1 drop Both Eyes BID   Chlorhexidine Gluconate Cloth  6 each Topical Q0600   folic acid  1 mg Oral Daily  lactose free nutrition  237 mL Oral TID WC   latanoprost  1 drop Both Eyes QHS   multivitamin with minerals  1 tablet Oral Daily   Muscle Rub  1 Application Topical TID   polyethylene glycol  17 g Oral Daily   senna-docusate  1 tablet Oral BID   sodium bicarbonate  650 mg Oral TID   Continuous Infusions:     LOS: 7 days    Time spent: 49 minutes spent on chart review, discussion with nursing staff, consultants, updating family and interview/physical exam; more than 50% of that time was spent in counseling and/or coordination of care.    Alvira Philips [place], DO [facility] Available via Epic secure chat 7am-7pm After these hours, please refer to coverage provider listed on [email or web address] 07/19/2023, 12:43 PM

## 2023-07-19 NOTE — Progress Notes (Signed)
 [facility] Progress Note  Subjective:  Good UOP Creat down 2.6  Vitals:   07/18/23 2314 07/19/23 0619 07/19/23 1000 07/19/23 1331  BP: (!) 121/55 (!) 104/54  123/71  Pulse: (!) 59 (!) 109  61  Resp: 20 16    Temp: 98 F (36.7 C) 98.1 F (36.7 C)  98.3 F (36.8 C)  TempSrc:  Oral  Oral  SpO2: 99% 100%  100%  Weight:   51.1 kg   Height:   5' (1.524 m)     Exam: General:  elderly female in bed in NAD, frail HEENT: NCAT Eyes: EOMI sclera anicteric Neck: supple trachea midline  Heart: S1S2 no rub Lungs: clear to auscultation; normal work of breathing on room air Abdomen: soft/nt/nd Extremities: no edema appreciated; no cyanosis or clubbing Skin: no rash on extremities exposed Neuro: awake and conversant; follows commands Psych normal mood and affect Gu foley in place with yellow urine  Summary: 89 yo w/ hx of anemia, DJD, CKD 3, CAD, falls, HL, HTN, OAB, PAF and TIA who was has been f/u urology since nov 2024 when she was found to have a R UPJ stone w/ assoc UTI. She had R ureteral stent placed w/ cystoscopy. She rx'd w/ course of abx for proteus +cx's in blood and urine. In Nov had urology f/b in office and KUB showed the R ureteral stent to be in good position. Urine cx 12/03 showed <10K colonies. F/u UCx on 07/01/23 gre >100K proteus. She was rx'd w/ 10 days of cipro. She presented for cysto, R ureteral laser lithotripsy and poss exchange for R ureteral stent. When labs showed AKI w/ creat 5.8 (baseline 1- 1.4), pt was admitted and renal service was consulted.     CT a/p 2/26 - showed two kidneys w/o hydronephrosis, R ureteral stent in place     Assessment/Plan:   # AKI - ATN now in the post-ATN diuresis phase.  Previously with pre-renal insults as well.  CK is fine.  Her family does not wish to pursue aggressive measure such as dialysis as they do not feel she will tolerate it and I agree. Total I/O's are 12 L in and 3.8 L UOP since admission. CT showed no  obstruction and R ureteral stent in place. Creat peaked at 5.5 and was down to 3.1 yesterday and 2.6 today. No further suggestions, will arrange for office f/u in 4 wks. Will sign off.    # Ureteral stenting - appreciate urology, she had a stent removed yest along w/ lithotripsy of R ureteral stone.   # Volume - mild LE edema, lying flat on RA.    # Nephrolithiasis - non-obstructive per radiology, per urology   # CKD stage 3 - baseline Cr 1-1.4   # HTN - controlled. Avoid hypotension    # Metabolic acidosis - continue sodium bicarbonate TID  # Anemia - normocytic, mild and s/p hydration. Fe panel acceptable, cont on folic acid     Vinson Moselle MD  [facility] 07/19/2023, 3:40 PM  Recent Labs  Lab 07/13/23 0421 07/14/23 0415 07/15/23 0758 07/17/23 0436 07/18/23 0855 07/19/23 0434  HGB 10.4* 10.3*  --   --  10.3*  --   ALBUMIN 2.1*  --   --  2.0*  --   --   CALCIUM 7.6* 7.8*   < > 8.0* 8.0* 7.7*  PHOS  --   --   --  4.1  --   --   CREATININE 5.29* 5.51*   < >  3.61* 3.16* 2.70*  K 4.3 3.9   < > 3.6 3.5 3.7   < > = values in this interval not displayed.   Recent Labs  Lab 07/14/23 0415  IRON 70  TIBC 203*  FERRITIN 199   Inpatient medications:  aspirin EC  81 mg Oral Daily   atorvastatin  20 mg Oral Daily   brimonidine  1 drop Both Eyes BID   And   timolol  1 drop Both Eyes BID   Chlorhexidine Gluconate Cloth  6 each Topical Q0600   folic acid  1 mg Oral Daily   lactose free nutrition  237 mL Oral TID WC   latanoprost  1 drop Both Eyes QHS   multivitamin with minerals  1 tablet Oral Daily   Muscle Rub  1 Application Topical TID   polyethylene glycol  17 g Oral Daily   senna-docusate  1 tablet Oral BID   sodium bicarbonate  650 mg Oral TID     acetaminophen **OR** acetaminophen, guaiFENesin-dextromethorphan, nitroGLYCERIN, ondansetron **OR** ondansetron (ZOFRAN) IV

## 2023-07-19 NOTE — Plan of Care (Signed)

## 2023-07-20 DIAGNOSIS — N1831 Chronic kidney disease, stage 3a: Secondary | ICD-10-CM | POA: Diagnosis not present

## 2023-07-20 LAB — BASIC METABOLIC PANEL
Anion gap: 8 (ref 5–15)
BUN: 26 mg/dL — ABNORMAL HIGH (ref 8–23)
CO2: 21 mmol/L — ABNORMAL LOW (ref 22–32)
Calcium: 7.5 mg/dL — ABNORMAL LOW (ref 8.9–10.3)
Chloride: 107 mmol/L (ref 98–111)
Creatinine, Ser: 2.22 mg/dL — ABNORMAL HIGH (ref 0.44–1.00)
GFR, Estimated: 20 mL/min — ABNORMAL LOW (ref >60)
Glucose, Bld: 144 mg/dL — ABNORMAL HIGH (ref 70–99)
Potassium: 3.2 mmol/L — ABNORMAL LOW (ref 3.5–5.1)
Sodium: 136 mmol/L (ref 135–145)

## 2023-07-20 MED ORDER — POTASSIUM CHLORIDE CRYS ER 20 MEQ PO TBCR
40.0000 meq | EXTENDED_RELEASE_TABLET | ORAL | Status: DC
  Administered 2023-07-20: 40 meq via ORAL
  Filled 2023-07-20: qty 2

## 2023-07-20 MED ORDER — SODIUM BICARBONATE 650 MG PO TABS: 650.0000 mg | ORAL_TABLET | Freq: Three times a day (TID) | ORAL | 0 refills | Status: DC

## 2023-07-20 MED ORDER — SENNOSIDES-DOCUSATE SODIUM 8.6-50 MG PO TABS: 1.0000 | ORAL_TABLET | Freq: Two times a day (BID) | ORAL | 0 refills | Status: AC

## 2023-07-20 NOTE — Care Management Important Message (Signed)
 Important Message  Patient Details IM Letter mailed to Holly Nunez Name: Holly Nunez MRN: [id number] Date of Birth: 07/21/1924   Important Message Given:  Yes - Medicare IM     Caren Macadam 07/20/2023, 11:22 AM

## 2023-07-20 NOTE — Progress Notes (Signed)
 Report called to [facility]

## 2023-07-20 NOTE — TOC Progression Note (Signed)
 Transition of Care [facility]) - Progression Note    Patient Details  Name: Holly Nunez MRN: [id number] Date of Birth: 1925/04/16  [facility] of [facility]) CM/SW Contact  Larrie Kass, LCSW Phone Number: 07/20/2023, 9:59 AM  Clinical Narrative:    Pt to return to LTC facility [facility], CSW spoke with pt's son to inform him of d/c plan. Pt room 210 , RN to call report to [phone]. PTAR called, TOC sign off.    Expected Discharge Plan: Long Term Nursing Home Barriers to Discharge: No Barriers Identified  Expected Discharge Plan and Services         Expected Discharge Date: 07/20/23                                     Social Determinants of Health (SDOH) Interventions SDOH Screenings   Food Insecurity: No Food Insecurity (07/12/2023)  Housing: Patient Declined (07/13/2023)  Transportation Needs: No Transportation Needs (07/12/2023)  Utilities: Patient Declined (07/13/2023)  Alcohol Screen: Low Risk  (01/29/2021)  Depression (PHQ2-9): Low Risk  (01/29/2021)  Financial Resource Strain: Low Risk  (01/29/2021)  Physical Activity: Inactive (01/29/2021)  Social Connections: Patient Declined (07/12/2023)  Stress: No Stress Concern Present (01/29/2021)  Tobacco Use: Low Risk  (07/18/2023)    Readmission Risk Interventions     No data to display

## 2023-07-20 NOTE — Discharge Summary (Signed)
 Physician Discharge Summary  Miquel Lamson AVW:[id number] DOB: August 04, 1924 DOA: 07/12/2023  PCP: Default, Provider, MD  Admit date: 07/12/2023 Discharge date: 07/20/2023  Admitted From: [facility]-term [facility] Disposition: [facility]  Recommendations for Outpatient Follow-up:  Follow up with PCP in 1-2 weeks Follow-up with nephrology 4 weeks Outpatient follow-up with urology, Dr. Pete Glatter Started on sodium bicarbonate 650 mg p.o. 3 times daily Please obtain BMP in one week to recheck renal function, creatinine 2.22 at time of discharge Continue to encourage increased oral intake, hydration  Discharge Condition: Stable CODE STATUS: Full code Diet recommendation: Regular diet  History of present illness:  Holly Nunez is a 89 y.o. female with past medical history significant for CAD, HTN,, paroxysmal atrial fibrillation, CKD stage IIIa, nephrolithiasis, anemia of chronic medical/renal disease, wheelchair dependent at baseline who is admitted to [facility] after being found with acute renal failure prior to planned lithotripsy and right ureteral stent exchange on 07/12/2023.  On presentation for day of surgery, lab work showed a creatinine of 5.9, potassium of 4.5.  Anesthesia canceled the case and neurology requested direct admission for further evaluation and management of acute on chronic renal failure.   Hospital course:  Acute renal failure on CKD stage IIIa Metabolic acidosis Patient presented as a direct admission after being found with elevated creatinine prior to planned lithotripsy and ureteral stent exchange by urology on 07/12/2023.  Baseline creatinine 1-1.4, on day of surgery was found to have a creatinine elevated at 5.98.  CT abdomen/pelvis with noted right ureteral stent, interval dilution of right hydroureteronephrosis, nonobstructing bilateral 1-3 mm nephrolithiasis.  Nephrology and urology were consulted and followed during the hospital  course.  Patient was poor with IV fluids, sodium bicarbonate 651 with p.o. 3 times daily with improvement of creatinine down to 2.22 at time of discharge.  Continue to encourage increased oral intake, hydration.  Recommend repeat BMP 1 week.  Outpatient follow-up with nephrology 4 weeks.   Essential hypertension Paroxysmal atrial fibrillation Not on any rate controlling, antihypertensives, anticoagulants outpatient.  EKG on admission with normal sinus rhythm.   CAD Continue aspirin and statin   Nephrolithiasis Patient underwent lithotripsy with right ureteral stent removal on 07/18/2023 by Dr. Pete Glatter.  Outpatient follow-up with urology.  Discharge Diagnoses:  Principal Problem:   CKD stage 3a, GFR 45-59 ml/min (HCC) Active Problems:   Urolithiasis   HTN (hypertension)   Unspecified atrial fibrillation (HCC)   Hyperlipidemia   Normocytic anemia   Pressure injury of skin    Discharge Instructions  Discharge Instructions     Call MD for:  difficulty breathing, headache or visual disturbances   Complete by: As directed    Call MD for:  extreme fatigue   Complete by: As directed    Call MD for:  persistant dizziness or light-headedness   Complete by: As directed    Call MD for:  persistant nausea and vomiting   Complete by: As directed    Call MD for:  severe uncontrolled pain   Complete by: As directed    Call MD for:  temperature >100.4   Complete by: As directed    Diet - low sodium heart healthy   Complete by: As directed    Increase activity slowly   Complete by: As directed    No wound care   Complete by: As directed       Allergies as of 07/20/2023       Reactions   Alphagan [brimonidine] Other (  See Comments)   Secondary infection and vision changes. Allergy not listed on MAR         Medication List     TAKE these medications    acetaminophen 650 MG CR tablet Commonly known as: TYLENOL Take 2 tablets (1,300 mg total) by mouth every 6 (six) hours as  needed for pain (for mild pain). What changed:  when to take this reasons to take this   ASPERCREME EX Apply 1 application  topically in the morning and at bedtime. Apply to both legs   aspirin EC 81 MG tablet Take 81 mg by mouth in the morning.   atorvastatin 20 MG tablet Commonly known as: LIPITOR Take 20 mg by mouth in the morning.   Biofreeze Cool The Pain 4 % Gel Generic drug: Menthol (Topical Analgesic) Apply 1 Application topically 3 (three) times daily.   Combigan 0.2-0.5 % ophthalmic solution Generic drug: brimonidine-timolol Place 1 drop into both eyes in the morning and at bedtime.   Dextran 70-Hypromellose 0.1-0.3 % Soln Apply 2 drops to eye every 12 (twelve) hours as needed (dry, itchy, irritated eyes).   dextromethorphan-guaiFENesin 10-100 MG/5ML liquid Commonly known as: ROBITUSSIN-DM Take 10 mLs by mouth every 6 (six) hours as needed for cough.   folic acid 1 MG tablet Commonly known as: FOLVITE Take 1 mg by mouth in the morning.   latanoprost 0.005 % ophthalmic solution Commonly known as: XALATAN Place 1 drop into both eyes at bedtime.   multivitamin with minerals Tabs tablet Take 1 tablet by mouth in the morning.   nitroGLYCERIN 0.4 MG SL tablet Commonly known as: NITROSTAT Place 1 tablet (0.4 mg total) under the tongue every 5 (five) minutes as needed for chest pain.   NON FORMULARY Take 120 mLs by mouth in the morning and at bedtime. Med pass   Omega 3 1000 MG Caps Take 1,000 mg by mouth daily.   ondansetron 4 MG tablet Commonly known as: ZOFRAN Take 4 mg by mouth every 8 (eight) hours as needed for nausea or vomiting.   polyethylene glycol 17 g packet Commonly known as: MIRALAX / GLYCOLAX Take 17 g by mouth in the morning.   PROBIOTIC PO Take 1 capsule by mouth daily.   senna-docusate 8.6-50 MG tablet Commonly known as: Senokot-S Take 1 tablet by mouth 2 (two) times daily.   sodium bicarbonate 650 MG tablet Take 1 tablet (650 mg  total) by mouth 3 (three) times daily.   UNABLE TO FIND Take 120 mLs by mouth in the morning, at noon, and at bedtime. Med Name: Mighty Shake        Allergies  Allergen Reactions   Alphagan [Brimonidine] Other (See Comments)    Secondary infection and vision changes. Allergy not listed on [facility]     Consultations: Nephrology Urology   Procedures/Studies: DG C-Arm 1-60 Min-No Report Result Date: 07/18/2023 Fluoroscopy was utilized by the requesting physician.  No radiographic interpretation.   CT ABDOMEN PELVIS WO CONTRAST Result Date: 07/13/2023 CLINICAL DATA:  UTI, recurrent/complicated (Female) EXAM: CT ABDOMEN AND PELVIS WITHOUT CONTRAST TECHNIQUE: Multidetector CT imaging of the abdomen and pelvis was performed following the standard protocol without IV contrast. RADIATION DOSE REDUCTION: This exam was performed according to the departmental dose-optimization program which includes automated exposure control, adjustment of the mA and/or kV according to patient size and/or use of iterative reconstruction technique. COMPARISON:  CT abdomen pelvis 03/18/2023 FINDINGS: Lower chest: Interval increase in small right and interval development of trace left pleural  effusions. Bilateral lower lobe atelectasis. Four-vessel coronary calcification. Aortic valve leaflet calcification. Mitral annular calcification. Cardiac changes suggestive of anemia. Hepatobiliary: No focal liver abnormality. No gallstones, gallbladder wall thickening, or pericholecystic fluid. No biliary dilatation. Pancreas: No focal lesion. Normal pancreatic contour. No surrounding inflammatory changes. No main pancreatic ductal dilatation. Spleen: Normal in size without focal abnormality. Adrenals/Urinary Tract: No adrenal nodule bilaterally. Large fluid density lesion of the right kidney likely represents a simple renal cyst. Simple renal cysts, in the absence of clinically indicated signs/symptoms, require no independent follow-up.  Interval placement of a right ureteral stent with proximal pigtail along the right renal pelvis and distal pigtail within the urinary bladder lumen. Interval resolution of right hydronephrosis. No obstructive right nephroureterolithiasis. Punctate right nephrolithiasis. Curvilinear 3 mm left nephrolithiasis no left ureterolithiasis. No left hydroureteronephrosis. The urinary bladder is unremarkable. Stomach/Bowel: Stomach is within normal limits. No evidence of bowel wall thickening or dilatation. Stool throughout the colon. Appendix appears normal. Vascular/Lymphatic: No abdominal aorta or iliac aneurysm. Severe atherosclerotic plaque of the aorta and its branches. No abdominal, pelvic, or inguinal lymphadenopathy. Reproductive: Status post hysterectomy. No adnexal masses. Other: No intraperitoneal free fluid. No intraperitoneal free gas. No organized fluid collection. Musculoskeletal: No abdominal wall hernia or abnormality. No suspicious lytic or blastic osseous lesions. No acute displaced fracture. IMPRESSION: 1. Interval placement of a right ureteral stent with interval dilution of right hydroureteronephrosis. 2. Nonobstructive bilateral 1-3 mm nephrolithiasis. 3. Stool throughout the colon-correlate for constipation. 4. Interval increase in small right and interval development of trace left pleural effusions. 5. Aortic Atherosclerosis (ICD10-I70.0) including four-vessel coronary artery, mitral annular, aortic valve leaflet calcifications-correlate for aortic stenosis. 6. Otherwise limited evaluation on this noncontrast study. Electronically Signed   By: Tish Frederickson M.D.   On: 07/13/2023 10:14     Subjective: Patient seen examined bedside, lying in bed.  Family present at bedside.  Eating breakfast.  No complaints this morning.  Creatinine continues to trend down.  Stable for discharge back to SNF per nephrology with plan outpatient follow-up in 4 weeks.  Encouraged patient to maintain hydration with  increased oral intake.  No other questions or concerns at this time.  Denies headache, no dizziness, no chest pain, no palpitations, no shortness of breath, no abdominal pain, no fever/chills/night sweats, no nausea/vomiting/diarrhea.  No acute events overnight per nurse staff.  Discharge Exam: Vitals:   07/19/23 1949 07/20/23 0529  BP: (!) 144/64 137/60  Pulse: 66 (!) 42  Resp: 16 16  Temp: 98.1 F (36.7 C) 98.7 F (37.1 C)  SpO2: 100% 99%   Vitals:   07/19/23 1331 07/19/23 1949 07/20/23 0500 07/20/23 0529  BP: 123/71 (!) 144/64  137/60  Pulse: 61 66  (!) 42  Resp:  16  16  Temp: 98.3 F (36.8 C) 98.1 F (36.7 C)  98.7 F (37.1 C)  TempSrc: Oral Oral  Oral  SpO2: 100% 100%  99%  Weight:   51.1 kg   Height:        Physical Exam: GEN: NAD, alert and oriented x 3, elderly in appearance HEENT: NCAT, PERRL, EOMI, sclera clear, MMM PULM: CTAB w/o wheezes/crackles, normal respiratory effort, on room air CV: RRR w/o M/G/R GI: abd soft, NTND, NABS, no R/G/M MSK: no peripheral edema, muscle strength globally intact 5/5 bilateral upper/lower extremities NEURO: CN II-XII intact, no focal deficits, sensation to light touch intact PSYCH: normal mood/affect Integumentary: dry/intact, no rashes or wounds noted on exposed skin surfaces    The  results of significant diagnostics from this hospitalization (including imaging, microbiology, ancillary and laboratory) are listed below for reference.     Microbiology: Recent Results (from the past 240 hours)  Urine Culture     Status: Abnormal   Collection Time: 07/12/23  8:56 AM   Specimen: Urine, Clean Catch  Result Value Ref Range Status   Specimen Description   Final    URINE, CLEAN CATCH Performed at [facility], [address] [place], [place] [address]    Special Requests   Final    NONE Performed at [facility], [address], [place], [place] [address]    Culture 70,000 COLONIES/mL  YEAST (A)  Final   Report Status 07/14/2023 FINAL  Final     Labs: BNP (last 3 results) No results for input(s): "BNP" in the last 8760 hours. Basic Metabolic Panel: Recent Labs  Lab 07/16/23 0446 07/17/23 0436 07/18/23 0855 07/19/23 0434 07/20/23 0409  NA 139 140 142 140 136  K 3.8 3.6 3.5 3.7 3.2*  CL 112* 114* 115* 109 107  CO2 19* 18* 18* 20* 21*  GLUCOSE 95 99 94 137* 144*  BUN 37* 34* 30* 26* 26*  CREATININE 4.24* 3.61* 3.16* 2.70* 2.22*  CALCIUM 8.0* 8.0* 8.0* 7.7* 7.5*  MG  --  1.4*  --   --   --   PHOS  --  4.1  --   --   --    Liver Function Tests: Recent Labs  Lab 07/17/23 0436  ALBUMIN 2.0*   No results for input(s): "LIPASE", "AMYLASE" in the last 168 hours. No results for input(s): "AMMONIA" in the last 168 hours. CBC: Recent Labs  Lab 07/14/23 0415 07/18/23 0855  WBC 6.0 5.7  HGB 10.3* 10.3*  HCT 30.5* 32.8*  MCV 91.3 95.9  PLT 193 190   Cardiac Enzymes: No results for input(s): "CKTOTAL", "CKMB", "CKMBINDEX", "TROPONINI" in the last 168 hours. BNP: Invalid input(s): "POCBNP" CBG: No results for input(s): "GLUCAP" in the last 168 hours. D-Dimer No results for input(s): "DDIMER" in the last 72 hours. Hgb A1c No results for input(s): "HGBA1C" in the last 72 hours. Lipid Profile No results for input(s): "CHOL", "HDL", "LDLCALC", "TRIG", "CHOLHDL", "LDLDIRECT" in the last 72 hours. Thyroid function studies No results for input(s): "TSH", "T4TOTAL", "T3FREE", "THYROIDAB" in the last 72 hours.  Invalid input(s): "FREET3" Anemia work up No results for input(s): "VITAMINB12", "FOLATE", "FERRITIN", "TIBC", "IRON", "RETICCTPCT" in the last 72 hours. Urinalysis    Component Value Date/Time   COLORURINE YELLOW 07/13/2023 0930   APPEARANCEUR CLOUDY (A) 07/13/2023 0930   LABSPEC 1.010 07/13/2023 0930   PHURINE 6.0 07/13/2023 0930   GLUCOSEU NEGATIVE 07/13/2023 0930   HGBUR SMALL (A) 07/13/2023 0930   BILIRUBINUR NEGATIVE 07/13/2023 0930    KETONESUR NEGATIVE 07/13/2023 0930   PROTEINUR 30 (A) 07/13/2023 0930   NITRITE NEGATIVE 07/13/2023 0930   LEUKOCYTESUR LARGE (A) 07/13/2023 0930   Sepsis Labs Recent Labs  Lab 07/14/23 0415 07/18/23 0855  WBC 6.0 5.7   Microbiology Recent Results (from the past 240 hours)  Urine Culture     Status: Abnormal   Collection Time: 07/12/23  8:56 AM   Specimen: Urine, Clean Catch  Result Value Ref Range Status   Specimen Description   Final    URINE, CLEAN CATCH Performed at [facility], [address], [place], [place] [address]    Special Requests   Final    NONE Performed at  [facility], [address], [place], [place] [address]    Culture 70,000 COLONIES/mL YEAST (A)  Final   Report Status 07/14/2023 FINAL  Final     Time coordinating discharge: Over 30 minutes  SIGNED:   Alvira Philips [place], DO  [facility] 07/20/2023, 9:45 AM

## 2023-07-28 ENCOUNTER — Encounter: Payer: Medicare Other | Admitting: Urology

## 2023-08-02 LAB — CALCULI, WITH PHOTOGRAPH (CLINICAL LAB)
Ammonium Acid Urate Calculi: 40 %
Calcium Oxalate Monohydrate: 5 %
Carbonate Apatite: 45 %
Mg NH4 PO4 (Struvite): 10 %
Weight Calculi: 35 mg

## 2023-08-19 ENCOUNTER — Emergency Department (HOSPITAL_COMMUNITY)

## 2023-08-19 ENCOUNTER — Other Ambulatory Visit: Payer: Self-pay

## 2023-08-19 ENCOUNTER — Inpatient Hospital Stay (HOSPITAL_COMMUNITY)
Admission: EM | Admit: 2023-08-19 | Discharge: 2023-08-23 | DRG: 291 | Disposition: A | Discharge status: Skilled Nursing Facility | Source: Skilled Nursing Facility | Attending: Internal Medicine | Admitting: Internal Medicine

## 2023-08-19 ENCOUNTER — Encounter (HOSPITAL_COMMUNITY): Payer: Self-pay | Admitting: *Deleted

## 2023-08-19 DIAGNOSIS — Z96651 Presence of right artificial knee joint: Secondary | ICD-10-CM | POA: Diagnosis present

## 2023-08-19 DIAGNOSIS — E875 Hyperkalemia: Secondary | ICD-10-CM | POA: Diagnosis not present

## 2023-08-19 DIAGNOSIS — Z841 Family history of disorders of kidney and ureter: Secondary | ICD-10-CM

## 2023-08-19 DIAGNOSIS — Z9181 History of falling: Secondary | ICD-10-CM

## 2023-08-19 DIAGNOSIS — Z8249 Family history of ischemic heart disease and other diseases of the circulatory system: Secondary | ICD-10-CM

## 2023-08-19 DIAGNOSIS — R000 Tachycardia, unspecified: Secondary | ICD-10-CM | POA: Diagnosis present

## 2023-08-19 DIAGNOSIS — Z800 Family history of malignant neoplasm of digestive organs: Secondary | ICD-10-CM

## 2023-08-19 DIAGNOSIS — J90 Pleural effusion, not elsewhere classified: Secondary | ICD-10-CM | POA: Diagnosis present

## 2023-08-19 DIAGNOSIS — D649 Anemia, unspecified: Secondary | ICD-10-CM | POA: Diagnosis present

## 2023-08-19 DIAGNOSIS — I5043 Acute on chronic combined systolic (congestive) and diastolic (congestive) heart failure: Secondary | ICD-10-CM | POA: Diagnosis present

## 2023-08-19 DIAGNOSIS — E785 Hyperlipidemia, unspecified: Secondary | ICD-10-CM | POA: Diagnosis present

## 2023-08-19 DIAGNOSIS — I480 Paroxysmal atrial fibrillation: Secondary | ICD-10-CM | POA: Diagnosis present

## 2023-08-19 DIAGNOSIS — Z90710 Acquired absence of both cervix and uterus: Secondary | ICD-10-CM

## 2023-08-19 DIAGNOSIS — I10 Essential (primary) hypertension: Secondary | ICD-10-CM | POA: Diagnosis present

## 2023-08-19 DIAGNOSIS — N1831 Chronic kidney disease, stage 3a: Secondary | ICD-10-CM | POA: Diagnosis present

## 2023-08-19 DIAGNOSIS — I2510 Atherosclerotic heart disease of native coronary artery without angina pectoris: Secondary | ICD-10-CM | POA: Diagnosis present

## 2023-08-19 DIAGNOSIS — Z1152 Encounter for screening for COVID-19: Secondary | ICD-10-CM

## 2023-08-19 DIAGNOSIS — I4891 Unspecified atrial fibrillation: Secondary | ICD-10-CM | POA: Diagnosis not present

## 2023-08-19 DIAGNOSIS — Z79899 Other long term (current) drug therapy: Secondary | ICD-10-CM

## 2023-08-19 DIAGNOSIS — I429 Cardiomyopathy, unspecified: Secondary | ICD-10-CM | POA: Diagnosis present

## 2023-08-19 DIAGNOSIS — Z7401 Bed confinement status: Secondary | ICD-10-CM

## 2023-08-19 DIAGNOSIS — E876 Hypokalemia: Secondary | ICD-10-CM | POA: Diagnosis present

## 2023-08-19 DIAGNOSIS — Z818 Family history of other mental and behavioral disorders: Secondary | ICD-10-CM

## 2023-08-19 DIAGNOSIS — I130 Hypertensive heart and chronic kidney disease with heart failure and stage 1 through stage 4 chronic kidney disease, or unspecified chronic kidney disease: Secondary | ICD-10-CM | POA: Diagnosis not present

## 2023-08-19 DIAGNOSIS — Z888 Allergy status to other drugs, medicaments and biological substances status: Secondary | ICD-10-CM

## 2023-08-19 DIAGNOSIS — R400 Somnolence: Secondary | ICD-10-CM | POA: Diagnosis present

## 2023-08-19 DIAGNOSIS — Z8673 Personal history of transient ischemic attack (TIA), and cerebral infarction without residual deficits: Secondary | ICD-10-CM

## 2023-08-19 DIAGNOSIS — R296 Repeated falls: Secondary | ICD-10-CM | POA: Diagnosis present

## 2023-08-19 DIAGNOSIS — Z993 Dependence on wheelchair: Secondary | ICD-10-CM

## 2023-08-19 DIAGNOSIS — Z8042 Family history of malignant neoplasm of prostate: Secondary | ICD-10-CM

## 2023-08-19 DIAGNOSIS — E43 Unspecified severe protein-calorie malnutrition: Secondary | ICD-10-CM | POA: Diagnosis present

## 2023-08-19 DIAGNOSIS — Z6822 Body mass index (BMI) 22.0-22.9, adult: Secondary | ICD-10-CM

## 2023-08-19 DIAGNOSIS — N3281 Overactive bladder: Secondary | ICD-10-CM | POA: Diagnosis present

## 2023-08-19 DIAGNOSIS — I509 Heart failure, unspecified: Secondary | ICD-10-CM

## 2023-08-19 DIAGNOSIS — Z7982 Long term (current) use of aspirin: Secondary | ICD-10-CM

## 2023-08-19 NOTE — ED Provider Notes (Signed)
 [facility]-EMERGENCY DEPT [facility] Emergency Department Provider Note MRN:  [id number]  Arrival date & time: 08/20/23     Chief Complaint   Shortness of Breath   History of Present Illness   Holly Nunez is a 89 y.o. year-old female presents to the ED with chief complaint of SOB.  From [facility].  Family was concerned about SOB and CHF.  Requested that patient be brought to the ER for evaluation.    Patient states that she has been having some trouble breathing.  She tells me that she wears oxygen as needed and has had to wear it more often than normal.  She denies fever, chills, or cough.  History provided by patient.   Review of Systems  Pertinent positive and negative review of systems noted in HPI.    Physical Exam   Vitals:   08/20/23 0230 08/20/23 0245  BP: 102/63 (!) 106/56  Pulse: (!) 54 69  Resp: (!) 9 15  Temp:    SpO2: 100% 100%    CONSTITUTIONAL:  non toxic-appearing, NAD NEURO:  Alert and oriented x 3, CN 3-12 grossly intact EYES:  eyes equal and reactive ENT/NECK:  Supple, no stridor  CARDIO:  tachycardic, regular rhythm, appears well-perfused  PULM:  No respiratory distress, rales present GI/GU:  non-distended, non tender MSK/SPINE:  No gross deformities, + edema, moves all extremities  SKIN:  no rash, atraumatic   *Additional and/or pertinent findings included in MDM below  Diagnostic and Interventional Summary    EKG Interpretation Date/Time:  Friday August 19 2023 23:27:06 EDT Ventricular Rate:  130 PR Interval:    QRS Duration:  72 QT Interval:  336 QTC Calculation: 495 R Axis:   39  Text Interpretation: Atrial fibrillation Ventricular premature complex Low voltage, extremity leads Consider left ventricular hypertrophy Borderline prolonged QT interval Confirmed by Zadie Rhine ([id number]) on 08/19/2023 11:33:51 PM       Labs Reviewed  CBC WITH DIFFERENTIAL/PLATELET - Abnormal; Notable for the following components:       Result Value   RBC 3.81 (*)    Hemoglobin 11.7 (*)    HCT 35.8 (*)    RDW 17.9 (*)    All other components within normal limits  COMPREHENSIVE METABOLIC PANEL WITH GFR - Abnormal; Notable for the following components:   Potassium 3.1 (*)    Chloride 97 (*)    CO2 34 (*)    Glucose, Bld 116 (*)    Calcium 8.7 (*)    Total Protein 5.0 (*)    Albumin 2.1 (*)    All other components within normal limits  BRAIN NATRIURETIC PEPTIDE - Abnormal; Notable for the following components:   B Natriuretic Peptide 749.9 (*)    All other components within normal limits  RESP PANEL BY RT-PCR (RSV, FLU A&B, COVID)  [id number]    DG Chest Port 1 View  Final Result      Medications  diltiazem (CARDIZEM) 1 mg/mL load via infusion 10 mg (10 mg Intravenous Bolus from Bag 08/20/23 0223)    And  diltiazem (CARDIZEM) 125 mg in dextrose 5% 125 mL (1 mg/mL) infusion (5 mg/hr Intravenous New Bag/Given 08/20/23 0221)  potassium chloride 10 mEq in 100 mL IVPB (10 mEq Intravenous New Bag/Given 08/20/23 0246)     Procedures  /  Critical Care .Critical Care  Performed by: Roxy Horseman, PA-C Authorized by: Roxy Horseman, PA-C   Critical care provider statement:    Critical care time (minutes):  51  Critical care was necessary to treat or prevent imminent or life-threatening deterioration of the following conditions:  Circulatory failure and respiratory failure   Critical care was time spent personally by me on the following activities:  Development of treatment plan with patient or surrogate, discussions with consultants, evaluation of patient's response to treatment, examination of patient, ordering and review of laboratory studies, ordering and review of radiographic studies, ordering and performing treatments and interventions, pulse oximetry, re-evaluation of patient's condition and review of old charts   ED Course and Medical Decision Making  I have reviewed the triage vital signs, the nursing notes, and  pertinent available records from the EMR.  Social Determinants Affecting Complexity of Care: Patient has no clinically significant social determinants affecting this chief complaint..   ED Course:    Medical Decision Making Patient brought in by EMS from nursing home with shortness of breath.  Symptoms have worsened over the past day or so.  Family requested evaluation for CHF.  In triage, patient noted to be tachycardic and EKG shows A-fib with RVR.  Labs and imaging ordered.  Chest x-ray shows cardiomegaly with small left and moderate right pleural effusions.  Question bibasilar airspace disease of atelectasis versus pneumonia.  Patient has had some cough, but no fever.  No leukocytosis.  BNP is elevated at 769.  Will start patient on Cardizem, IV potassium due to low potassium, and will need Lasix for diuresis.  Will consult medicine for admission.  Amount and/or Complexity of Data Reviewed Labs: ordered. Radiology: ordered.  Risk Prescription drug management. Decision regarding hospitalization.         Consultants: I consulted with Hospitalist, Dr. Margo Aye, who is appreciated for admitting.   Treatment and Plan: Patient's exam and diagnostic results are concerning for CHF exacerbation and afib with RVR.  Feel that patient will need admission to the hospital for further treatment and evaluation.    Final Clinical Impressions(s) / ED Diagnoses     ICD-10-CM   1. Atrial fibrillation with RVR (HCC)  I48.91     2. Acute on chronic congestive heart failure, unspecified heart failure type [facility])  I50.9       ED Discharge Orders     None         Discharge Instructions Discussed with and Provided to Patient:   Discharge Instructions   None      Roxy Horseman, PA-C 08/20/23 [id number]    Zadie Rhine, MD 08/20/23 [phone]

## 2023-08-19 NOTE — ED Triage Notes (Signed)
 Pt arrived by [facility] from [facility]  she reports more difficulty breathing than normal.  She was seen by her doctor today and gave orders to the nursing home  the family was not satisfied with that and wanted her sent out top an ed  pt alert o arrival no pain  af on the monitornasal 02 at the nursing homeiv per ems  tylenol given at the nursing home 2000

## 2023-08-20 ENCOUNTER — Inpatient Hospital Stay (HOSPITAL_COMMUNITY)

## 2023-08-20 ENCOUNTER — Encounter (HOSPITAL_COMMUNITY): Payer: Self-pay | Admitting: Internal Medicine

## 2023-08-20 DIAGNOSIS — E43 Unspecified severe protein-calorie malnutrition: Secondary | ICD-10-CM | POA: Diagnosis present

## 2023-08-20 DIAGNOSIS — Z7401 Bed confinement status: Secondary | ICD-10-CM | POA: Diagnosis not present

## 2023-08-20 DIAGNOSIS — I4891 Unspecified atrial fibrillation: Secondary | ICD-10-CM | POA: Diagnosis present

## 2023-08-20 DIAGNOSIS — I130 Hypertensive heart and chronic kidney disease with heart failure and stage 1 through stage 4 chronic kidney disease, or unspecified chronic kidney disease: Secondary | ICD-10-CM | POA: Diagnosis present

## 2023-08-20 DIAGNOSIS — R400 Somnolence: Secondary | ICD-10-CM | POA: Diagnosis present

## 2023-08-20 DIAGNOSIS — R296 Repeated falls: Secondary | ICD-10-CM | POA: Diagnosis present

## 2023-08-20 DIAGNOSIS — D649 Anemia, unspecified: Secondary | ICD-10-CM | POA: Diagnosis present

## 2023-08-20 DIAGNOSIS — I480 Paroxysmal atrial fibrillation: Secondary | ICD-10-CM | POA: Diagnosis present

## 2023-08-20 DIAGNOSIS — I5043 Acute on chronic combined systolic (congestive) and diastolic (congestive) heart failure: Secondary | ICD-10-CM | POA: Diagnosis present

## 2023-08-20 DIAGNOSIS — N1831 Chronic kidney disease, stage 3a: Secondary | ICD-10-CM | POA: Diagnosis present

## 2023-08-20 DIAGNOSIS — I429 Cardiomyopathy, unspecified: Secondary | ICD-10-CM | POA: Diagnosis present

## 2023-08-20 DIAGNOSIS — J90 Pleural effusion, not elsewhere classified: Secondary | ICD-10-CM | POA: Diagnosis present

## 2023-08-20 DIAGNOSIS — I2510 Atherosclerotic heart disease of native coronary artery without angina pectoris: Secondary | ICD-10-CM | POA: Diagnosis present

## 2023-08-20 DIAGNOSIS — I5033 Acute on chronic diastolic (congestive) heart failure: Secondary | ICD-10-CM | POA: Diagnosis not present

## 2023-08-20 DIAGNOSIS — R000 Tachycardia, unspecified: Secondary | ICD-10-CM | POA: Diagnosis present

## 2023-08-20 DIAGNOSIS — Z1152 Encounter for screening for COVID-19: Secondary | ICD-10-CM | POA: Diagnosis not present

## 2023-08-20 DIAGNOSIS — E875 Hyperkalemia: Secondary | ICD-10-CM | POA: Diagnosis not present

## 2023-08-20 DIAGNOSIS — Z96651 Presence of right artificial knee joint: Secondary | ICD-10-CM | POA: Diagnosis present

## 2023-08-20 DIAGNOSIS — E785 Hyperlipidemia, unspecified: Secondary | ICD-10-CM | POA: Diagnosis present

## 2023-08-20 DIAGNOSIS — I10 Essential (primary) hypertension: Secondary | ICD-10-CM

## 2023-08-20 DIAGNOSIS — Z800 Family history of malignant neoplasm of digestive organs: Secondary | ICD-10-CM | POA: Diagnosis not present

## 2023-08-20 DIAGNOSIS — Z8042 Family history of malignant neoplasm of prostate: Secondary | ICD-10-CM | POA: Diagnosis not present

## 2023-08-20 DIAGNOSIS — N3281 Overactive bladder: Secondary | ICD-10-CM | POA: Diagnosis present

## 2023-08-20 DIAGNOSIS — Z79899 Other long term (current) drug therapy: Secondary | ICD-10-CM | POA: Diagnosis not present

## 2023-08-20 DIAGNOSIS — E876 Hypokalemia: Secondary | ICD-10-CM | POA: Diagnosis present

## 2023-08-20 DIAGNOSIS — I509 Heart failure, unspecified: Secondary | ICD-10-CM | POA: Diagnosis not present

## 2023-08-20 DIAGNOSIS — Z993 Dependence on wheelchair: Secondary | ICD-10-CM | POA: Diagnosis not present

## 2023-08-20 LAB — BRAIN NATRIURETIC PEPTIDE: B Natriuretic Peptide: 749.9 pg/mL — ABNORMAL HIGH (ref 0.0–100.0)

## 2023-08-20 LAB — CBC WITH DIFFERENTIAL/PLATELET
Abs Immature Granulocytes: 0.01 10*3/uL (ref 0.00–0.07)
Basophils Absolute: 0 10*3/uL (ref 0.0–0.1)
Basophils Relative: 1 %
Eosinophils Absolute: 0.1 10*3/uL (ref 0.0–0.5)
Eosinophils Relative: 1 %
HCT: 35.8 % — ABNORMAL LOW (ref 36.0–46.0)
Hemoglobin: 11.7 g/dL — ABNORMAL LOW (ref 12.0–15.0)
Immature Granulocytes: 0 %
Lymphocytes Relative: 14 %
Lymphs Abs: 0.9 10*3/uL (ref 0.7–4.0)
MCH: 30.7 pg (ref 26.0–34.0)
MCHC: 32.7 g/dL (ref 30.0–36.0)
MCV: 94 fL (ref 80.0–100.0)
Monocytes Absolute: 0.5 10*3/uL (ref 0.1–1.0)
Monocytes Relative: 8 %
Neutro Abs: 5 10*3/uL (ref 1.7–7.7)
Neutrophils Relative %: 76 %
Platelets: 170 10*3/uL (ref 150–400)
RBC: 3.81 MIL/uL — ABNORMAL LOW (ref 3.87–5.11)
RDW: 17.9 % — ABNORMAL HIGH (ref 11.5–15.5)
WBC: 6.6 10*3/uL (ref 4.0–10.5)
nRBC: 0 % (ref 0.0–0.2)

## 2023-08-20 LAB — LACTATE DEHYDROGENASE, PLEURAL OR PERITONEAL FLUID: LD, Fluid: 68 U/L — ABNORMAL HIGH (ref 3–23)

## 2023-08-20 LAB — COMPREHENSIVE METABOLIC PANEL WITH GFR
ALT: 15 U/L (ref 0–44)
AST: 30 U/L (ref 15–41)
Albumin: 2.1 g/dL — ABNORMAL LOW (ref 3.5–5.0)
Alkaline Phosphatase: 52 U/L (ref 38–126)
Anion gap: 10 (ref 5–15)
BUN: 11 mg/dL (ref 8–23)
CO2: 34 mmol/L — ABNORMAL HIGH (ref 22–32)
Calcium: 8.7 mg/dL — ABNORMAL LOW (ref 8.9–10.3)
Chloride: 97 mmol/L — ABNORMAL LOW (ref 98–111)
Creatinine, Ser: 0.86 mg/dL (ref 0.44–1.00)
GFR, Estimated: >60 mL/min (ref >60)
Glucose, Bld: 116 mg/dL — ABNORMAL HIGH (ref 70–99)
Potassium: 3.1 mmol/L — ABNORMAL LOW (ref 3.5–5.1)
Sodium: 141 mmol/L (ref 135–145)
Total Bilirubin: 0.8 mg/dL (ref 0.0–1.2)
Total Protein: 5 g/dL — ABNORMAL LOW (ref 6.5–8.1)

## 2023-08-20 LAB — CBC
HCT: 32.6 % — ABNORMAL LOW (ref 36.0–46.0)
Hemoglobin: 10.6 g/dL — ABNORMAL LOW (ref 12.0–15.0)
MCH: 31.4 pg (ref 26.0–34.0)
MCHC: 32.5 g/dL (ref 30.0–36.0)
MCV: 96.4 fL (ref 80.0–100.0)
Platelets: 168 10*3/uL (ref 150–400)
RBC: 3.38 MIL/uL — ABNORMAL LOW (ref 3.87–5.11)
RDW: 18 % — ABNORMAL HIGH (ref 11.5–15.5)
WBC: 6.8 10*3/uL (ref 4.0–10.5)
nRBC: 0 % (ref 0.0–0.2)

## 2023-08-20 LAB — BASIC METABOLIC PANEL WITH GFR
Anion gap: 9 (ref 5–15)
BUN: 11 mg/dL (ref 8–23)
CO2: 32 mmol/L (ref 22–32)
Calcium: 8.3 mg/dL — ABNORMAL LOW (ref 8.9–10.3)
Chloride: 99 mmol/L (ref 98–111)
Creatinine, Ser: 0.8 mg/dL (ref 0.44–1.00)
GFR, Estimated: >60 mL/min (ref >60)
Glucose, Bld: 106 mg/dL — ABNORMAL HIGH (ref 70–99)
Potassium: 3.4 mmol/L — ABNORMAL LOW (ref 3.5–5.1)
Sodium: 140 mmol/L (ref 135–145)

## 2023-08-20 LAB — GLUCOSE, PLEURAL OR PERITONEAL FLUID: Glucose, Fluid: 114 mg/dL

## 2023-08-20 LAB — BODY FLUID CELL COUNT WITH DIFFERENTIAL: Total Nucleated Cell Count, Fluid: 8 uL (ref 0–1000)

## 2023-08-20 LAB — RESP PANEL BY RT-PCR (RSV, FLU A&B, COVID)  RVPGX2
Influenza A by PCR: NEGATIVE
Influenza B by PCR: NEGATIVE
Resp Syncytial Virus by PCR: NEGATIVE
SARS Coronavirus 2 by RT PCR: NEGATIVE

## 2023-08-20 LAB — MAGNESIUM: Magnesium: 1.5 mg/dL — ABNORMAL LOW (ref 1.7–2.4)

## 2023-08-20 LAB — PHOSPHORUS: Phosphorus: 2.6 mg/dL (ref 2.5–4.6)

## 2023-08-20 MED ORDER — ATORVASTATIN CALCIUM 10 MG PO TABS
20.0000 mg | ORAL_TABLET | Freq: Every day | ORAL | Status: DC
  Administered 2023-08-20 – 2023-08-23 (×4): 20 mg via ORAL
  Filled 2023-08-20 (×4): qty 2

## 2023-08-20 MED ORDER — TIMOLOL MALEATE 0.5 % OP SOLN
1.0000 [drp] | Freq: Two times a day (BID) | OPHTHALMIC | Status: DC
  Administered 2023-08-20 – 2023-08-23 (×6): 1 [drp] via OPHTHALMIC
  Filled 2023-08-20: qty 5

## 2023-08-20 MED ORDER — PROCHLORPERAZINE EDISYLATE 10 MG/2ML IJ SOLN: 5.0000 mg | Freq: Four times a day (QID) | INTRAMUSCULAR | Status: DC | PRN

## 2023-08-20 MED ORDER — LIDOCAINE HCL (PF) 1 % IJ SOLN
1.0000 mL | Freq: Once | INTRAMUSCULAR | Status: DC
Start: 2023-08-20 — End: 2023-08-23

## 2023-08-20 MED ORDER — POTASSIUM CHLORIDE CRYS ER 20 MEQ PO TBCR
40.0000 meq | EXTENDED_RELEASE_TABLET | ORAL | Status: AC
  Administered 2023-08-20 (×2): 40 meq via ORAL
  Filled 2023-08-20 (×2): qty 2

## 2023-08-20 MED ORDER — LATANOPROST 0.005 % OP SOLN
1.0000 [drp] | Freq: Every day | OPHTHALMIC | Status: DC
  Administered 2023-08-20 – 2023-08-22 (×3): 1 [drp] via OPHTHALMIC
  Filled 2023-08-20: qty 2.5

## 2023-08-20 MED ORDER — POTASSIUM CHLORIDE 10 MEQ/100ML IV SOLN
10.0000 meq | INTRAVENOUS | Status: AC
  Administered 2023-08-20 (×2): 10 meq via INTRAVENOUS
  Filled 2023-08-20 (×2): qty 100

## 2023-08-20 MED ORDER — ASPIRIN 81 MG PO TBEC
81.0000 mg | DELAYED_RELEASE_TABLET | Freq: Every morning | ORAL | Status: DC
Start: 2023-08-20 — End: 2023-08-23
  Administered 2023-08-20 – 2023-08-23 (×4): 81 mg via ORAL
  Filled 2023-08-20 (×4): qty 1

## 2023-08-20 MED ORDER — ACETAMINOPHEN 325 MG PO TABS
650.0000 mg | ORAL_TABLET | Freq: Four times a day (QID) | ORAL | Status: DC | PRN
  Administered 2023-08-20 – 2023-08-21 (×4): 650 mg via ORAL
  Filled 2023-08-20 (×5): qty 2

## 2023-08-20 MED ORDER — DILTIAZEM HCL-DEXTROSE 125-5 MG/125ML-% IV SOLN (PREMIX)
5.0000 mg/h | INTRAVENOUS | Status: DC
  Administered 2023-08-20: 5 mg/h via INTRAVENOUS
  Filled 2023-08-20 (×2): qty 125

## 2023-08-20 MED ORDER — POTASSIUM CHLORIDE CRYS ER 20 MEQ PO TBCR
40.0000 meq | EXTENDED_RELEASE_TABLET | Freq: Once | ORAL | Status: AC
  Administered 2023-08-20: 40 meq via ORAL
  Filled 2023-08-20: qty 2

## 2023-08-20 MED ORDER — BRIMONIDINE TARTRATE 0.2 % OP SOLN
1.0000 [drp] | Freq: Two times a day (BID) | OPHTHALMIC | Status: DC
  Administered 2023-08-20 – 2023-08-23 (×7): 1 [drp] via OPHTHALMIC
  Filled 2023-08-20 (×2): qty 5

## 2023-08-20 MED ORDER — FUROSEMIDE 10 MG/ML IJ SOLN
20.0000 mg | Freq: Two times a day (BID) | INTRAMUSCULAR | Status: DC
  Administered 2023-08-20 (×2): 20 mg via INTRAVENOUS
  Filled 2023-08-20 (×2): qty 2

## 2023-08-20 MED ORDER — MUSCLE RUB 10-15 % EX CREA
1.0000 | TOPICAL_CREAM | Freq: Three times a day (TID) | CUTANEOUS | Status: DC
  Administered 2023-08-20 – 2023-08-23 (×3): 1 via TOPICAL
  Filled 2023-08-20: qty 85

## 2023-08-20 MED ORDER — ENOXAPARIN SODIUM 30 MG/0.3ML IJ SOSY: 30.0000 mg | PREFILLED_SYRINGE | Freq: Every day | INTRAMUSCULAR | Status: DC

## 2023-08-20 MED ORDER — DILTIAZEM LOAD VIA INFUSION
10.0000 mg | Freq: Once | INTRAVENOUS | Status: AC
  Administered 2023-08-20: 10 mg via INTRAVENOUS
  Filled 2023-08-20: qty 10

## 2023-08-20 MED ORDER — MELATONIN 5 MG PO TABS: 5.0000 mg | ORAL_TABLET | Freq: Every evening | ORAL | Status: DC | PRN

## 2023-08-20 MED ORDER — ENOXAPARIN SODIUM 30 MG/0.3ML IJ SOSY
30.0000 mg | PREFILLED_SYRINGE | Freq: Every day | INTRAMUSCULAR | Status: DC
Start: 2023-08-21 — End: 2023-08-23
  Administered 2023-08-21 – 2023-08-23 (×3): 30 mg via SUBCUTANEOUS
  Filled 2023-08-20 (×3): qty 0.3

## 2023-08-20 MED ORDER — POLYETHYLENE GLYCOL 3350 17 G PO PACK: 17.0000 g | PACK | Freq: Every day | ORAL | Status: DC | PRN

## 2023-08-20 MED ORDER — DILTIAZEM HCL ER COATED BEADS 120 MG PO CP24
120.0000 mg | ORAL_CAPSULE | Freq: Every day | ORAL | Status: DC
  Administered 2023-08-20: 120 mg via ORAL
  Filled 2023-08-20 (×2): qty 1

## 2023-08-20 MED ORDER — FOLIC ACID 1 MG PO TABS
1.0000 mg | ORAL_TABLET | Freq: Every day | ORAL | Status: DC
  Administered 2023-08-20 – 2023-08-23 (×4): 1 mg via ORAL
  Filled 2023-08-20 (×4): qty 1

## 2023-08-20 MED ORDER — MAGNESIUM SULFATE 2 GM/50ML IV SOLN
2.0000 g | Freq: Once | INTRAVENOUS | Status: AC
  Administered 2023-08-20: 2 g via INTRAVENOUS
  Filled 2023-08-20: qty 50

## 2023-08-20 MED ORDER — POTASSIUM CHLORIDE 10 MEQ/100ML IV SOLN
10.0000 meq | INTRAVENOUS | Status: AC
Start: 2023-08-20 — End: 2023-08-20
  Administered 2023-08-20 (×3): 10 meq via INTRAVENOUS
  Filled 2023-08-20 (×3): qty 100

## 2023-08-20 MED ORDER — BRIMONIDINE TARTRATE-TIMOLOL 0.2-0.5 % OP SOLN: 1.0000 [drp] | Freq: Two times a day (BID) | OPHTHALMIC | Status: DC

## 2023-08-20 NOTE — Assessment & Plan Note (Signed)
 08-20-2023 replete with IV Mg

## 2023-08-20 NOTE — ED Notes (Signed)
 Ccmd contacted  to watch this pt

## 2023-08-20 NOTE — Procedures (Signed)
 Ultrasound-guided diagnostic and therapeutic right sided thoracentesis performed yielding 550 milliliters of cloudy yellow colored fluid. No immediate complications.   Diagnostic fluid was sent to the lab for further analysis. Follow-up chest x-ray pending. EBL is < 2 ml.

## 2023-08-20 NOTE — ED Notes (Signed)
 Family at the bedside.

## 2023-08-20 NOTE — Assessment & Plan Note (Signed)
 08-20-2023 bedside thoracic U/S shows 500-750 ml right pleural effusion. 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  Continue with IV lasix. Order right thoracentesis by IR. Dtr is agreeable.

## 2023-08-20 NOTE — Assessment & Plan Note (Signed)
 08-20-2023 stable Scr 0.80.  baseline Scr 1.1-1.3

## 2023-08-20 NOTE — Assessment & Plan Note (Signed)
 08-20-2023 in looking at pt's [facility] from SNF, she is not on any HTN meds. Starting cardizem-CD 120 mg daily for HR control.

## 2023-08-20 NOTE — Assessment & Plan Note (Signed)
 08-20-2023 HR now controled with cardizem gtts. Check echo. Start cardizem CD 120 mg daily. Has known hx of afib. Not on systemic anticoagulation due to fall risks. Pt no longer walking but hx of psoas hematoma while on systemic anticoagulation.HR now controlled on IV cardizem CD. Discussed with dtr [name]. Shared decision making. I think that risk of bleeding from systemic anticoagulation outweigh any benefits gain from CVA prophylaxis in this 89 yo AAF who is bedridden.  Therefore, dtr [name] agrees not to start systemic anticoagulation.

## 2023-08-20 NOTE — Subjective & Objective (Signed)
 Pt seen and examined. Discussed with pt's dtr [name] who is at bedside. Pt has lives at [facility] for about 3-4 years now. Pt is wheelchair/bedbound. Admitted for rapid afib. Has known hx of afib. Not on systemic anticoagulation due to fall risks.  Pt no longer walking but hx of psoas hematoma while on systemic anticoagulation.  HR now controlled on IV cardizem CD. Discussed with dtr [name]. Shared decision making. I think that risk of bleeding from systemic anticoagulation outweigh any benefits gain from CVA prophylaxis in this 89 yo AAF who is bedridden.

## 2023-08-20 NOTE — H&P (Addendum)
 History and Physical  Holly Nunez QVZ:[id number] DOB: 07-Feb-1925 DOA: 08/19/2023  Referring physician: Dahlia Client. PA-EDP  PCP: Default, Provider, MD  Outpatient Specialists: Cardiology Patient coming from: SNF  Chief Complaint: Shortness of breath.  HPI: Holly Nunez is a 89 y.o. female with medical history significant for chronic HFpEF, paroxysmal A-fib not on oral anticoagulation, previously on Xarelto which was stopped due to history of frequent falls, hyperlipidemia, who presents to the ER via EMS from SNF due to shortness of breath of a few days duration, worse for the past day.  Per her daughter at bedside, she has had a decline in her appetite and has been generally weak spending most time in bed.  In the ER, tachycardic in A-fib with RVR.  Chest x-ray revealed pulmonary edema and bilateral pleural effusions.  Cardizem drip was initiated.  TRH, hospitalist service, was asked to admit.  At the time of this visit, the patient is somnolent but is arousable to voices.  She has no complaints.  Her daughter is present at bedside who confirms full code CODE STATUS.  ED Course: Temperature 97.6.  BP 108/58, pulse 125, respiration rate 10, O2 saturation 100% on 2 L.  Lab studies notable for serum potassium 3.1, serum bicarb 34, glucose 116, albumin 2.1, BNP 749.  Review of Systems: Review of systems as noted in the HPI. All other systems reviewed and are negative.   Past Medical History:  Diagnosis Date   Anemia    Arthritis    CKD (chronic kidney disease), stage III (HCC)    3B   Coronary artery disease    Dysphagia    pureed diet   Estrogen deficiency 05/27/2016   Falls    Glaucoma    Great toe pain, left 12/02/2016   H/O measles    H/O mumps    History of chicken pox    Hyperglycemia 05/27/2016   Hyperlipidemia    Hypertension    OAB (overactive bladder)    PAF (paroxysmal atrial fibrillation) (HCC)    Patent foramen ovale    Preventative health care 02/26/2015    Thrombocytopenia (HCC)    Thyroid nodule    Biopsy-negative   TIA (transient ischemic attack)    Urinary incontinence    Past Surgical History:  Procedure Laterality Date   ABDOMINAL HYSTERECTOMY     fibroid   APPENDECTOMY     CYSTOSCOPY W/ URETERAL STENT PLACEMENT Right 07/18/2023   Procedure: CYSTOSCOPY WITH RETROGRADE PYELOGRAM/URETERAL STENT PLACEMENT;  Surgeon: Holly Nunez., MD;  Location: [facility];  Service: Urology;  Laterality: Right;   CYSTOSCOPY WITH RETROGRADE PYELOGRAM, URETEROSCOPY AND STENT PLACEMENT Right 03/19/2023   Procedure: CYSTO-URETEROSCOPY; RIGHT URETERAL STENT PLACEMENT;  Surgeon: Holly Nunez., MD;  Location: [facility] OR;  Service: Urology;  Laterality: Right;   EYE SURGERY Bilateral    CATARACT REMOVAL   JOINT REPLACEMENT     REPLACEMENT TOTAL KNEE Right 2007   THYROID LOBECTOMY Left 04/25/2015   THYROID LOBECTOMY Left 04/25/2015   Procedure: LEFT THYROID LOBECTOMY;  Surgeon: Holly Level, MD;  Location: [facility] OR;  Service: General;  Laterality: Left;   TRIGGER FINGER RELEASE  2007   URETEROSCOPY WITH HOLMIUM LASER LITHOTRIPSY Right 07/18/2023   Procedure: URETEROSCOPY WITH HOLMIUM LASER LITHOTRIPSY;  Surgeon: Holly Nunez., MD;  Location: [facility];  Service: Urology;  Laterality: Right;    Social History:  reports that she has never smoked. She has never used smokeless tobacco. She reports that she does not drink alcohol and does  not use drugs.   Allergies  Allergen Reactions   Alphagan [Brimonidine] Other (See Comments)    Secondary infection and vision changes. Allergy not listed on MAR     Family History  Problem Relation Age of Onset   Heart disease Mother    Hypertension Mother    Kidney disease Sister    Mental illness Sister        suicide, depresion   Gout Brother    Hypertension Son    Cancer Maternal Grandmother    Cancer Sister    Bone cancer Sister    Cancer Sister    Cancer Sister        kidney   Cancer Daughter     Pancreatic cancer Daughter    Thyroid disease Daughter    Thyroid disease Daughter        thyroid nodules   Hypertension Daughter    Irritable bowel syndrome Daughter    Cancer Daughter        thyroid   Cancer Brother    Prostate cancer Brother       Prior to Admission medications   Medication Sig Start Date End Date Taking? Authorizing Provider  acetaminophen (TYLENOL) 650 MG CR tablet Take 2 tablets (1,300 mg total) by mouth every 6 (six) hours as needed for pain (for mild pain). Patient taking differently: Take 650 mg by mouth in the morning, at noon, and at bedtime. May also give 2 tablets every 6 hours as needed for pain 05/15/21  Yes Rizwan, Ladell Heads, MD  aspirin EC 81 MG tablet Take 81 mg by mouth in the morning.   Yes [provider]  atorvastatin (LIPITOR) 20 MG tablet Take 20 mg by mouth in the morning.   Yes [provider]  COMBIGAN 0.2-0.5 % ophthalmic solution Place 1 drop into both eyes in the morning and at bedtime. 03/14/23  Yes [provider]  folic acid (FOLVITE) 1 MG tablet Take 1 mg by mouth in the morning.   Yes [provider]  latanoprost (XALATAN) 0.005 % ophthalmic solution Place 1 drop into both eyes at bedtime. 03/10/23  Yes [provider]  Menthol, Topical Analgesic, (BIOFREEZE COOL THE PAIN) 4 % GEL Apply 1 Application topically 3 (three) times daily. Apply topically to neck   Yes [provider]  mirtazapine (REMERON) 7.5 MG tablet Take 7.5 mg by mouth at bedtime. 05/02/23  Yes [provider]  Multiple Vitamin (MULTIVITAMIN WITH MINERALS) TABS tablet Take 1 tablet by mouth in the morning.   Yes [provider]  NON FORMULARY Take 120 mLs by mouth in the morning and at bedtime. Med pass   Yes [provider]  Omega 3 1000 MG CAPS Take 1,000 mg by mouth daily.   Yes [provider]  ondansetron (ZOFRAN) 4 MG tablet Take 4 mg by mouth every 8 (eight) hours as needed for  nausea or vomiting.   Yes [provider]  polyethylene glycol (MIRALAX / GLYCOLAX) 17 g packet Take 17 g by mouth in the morning.   Yes [provider]  Probiotic Product (PROBIOTIC PO) Take 1 capsule by mouth daily.   Yes [provider]  senna-docusate (SENOKOT-S) 8.6-50 MG tablet Take 1 tablet by mouth 2 (two) times daily. 07/20/23 10/18/23 Yes [place], Eric J, DO  sodium bicarbonate 650 MG tablet Take 1 tablet (650 mg total) by mouth 3 (three) times daily. 07/20/23 10/18/23 Yes [place], Eric J, DO  Trolamine Salicylate (ASPERCREME EX) Apply  1 application  topically in the morning and at bedtime. Apply to both legs   Yes [provider]  UNABLE TO FIND Take 120 mLs by mouth in the morning, at noon, and at bedtime. Med Name: Mighty Shake   Yes [provider]  Dextran 70-Hypromellose 0.1-0.3 % SOLN Apply 2 drops to eye every 12 (twelve) hours as needed (dry, itchy, irritated eyes).    [provider]  dextromethorphan-guaiFENesin (ROBITUSSIN-DM) 10-100 MG/5ML liquid Take 10 mLs by mouth every 6 (six) hours as needed for cough.    [provider]  nitroGLYCERIN (NITROSTAT) 0.4 MG SL tablet Place 1 tablet (0.4 mg total) under the tongue every 5 (five) minutes as needed for chest pain. 10/01/20 07/01/27  Lewayne Bunting, MD    Physical Exam: BP (!) 106/56   Pulse 69   Temp 97.8 F (36.6 C) (Oral)   Resp 15   Ht 5' (1.524 m)   Wt 51.1 kg   SpO2 100%   BMI 22.00 kg/m   General: 89 y.o. year-old female well developed well nourished in no acute distress.  Somnolent, arousable to voices. Cardiovascular: Irregular rate and rhythm with no rubs or gallops.  No thyromegaly or JVD noted.  1+ pitting edema in lower extremities bilaterally.   Respiratory: Mild rales at bases with poor inspiratory effort. Abdomen: Soft nontender nondistended with normal bowel sounds x4 quadrants. Muskuloskeletal: No cyanosis or clubbing noted bilaterally Neuro:  CN II-XII intact, strength, sensation, reflexes Skin: No ulcerative lesions noted or rashes Psychiatry: Unable to assess judgment and mood due to somnolence          Labs on Admission:  Basic Metabolic Panel: Recent Labs  Lab 08/20/23 0056  NA 141  K 3.1*  CL 97*  CO2 34*  GLUCOSE 116*  BUN 11  CREATININE 0.86  CALCIUM 8.7*   Liver Function Tests: Recent Labs  Lab 08/20/23 0056  AST 30  ALT 15  ALKPHOS 52  BILITOT 0.8  PROT 5.0*  ALBUMIN 2.1*   No results for input(s): "LIPASE", "AMYLASE" in the last 168 hours. No results for input(s): "AMMONIA" in the last 168 hours. CBC: Recent Labs  Lab 08/20/23 0056  WBC 6.6  NEUTROABS 5.0  HGB 11.7*  HCT 35.8*  MCV 94.0  PLT 170   Cardiac Enzymes: No results for input(s): "CKTOTAL", "CKMB", "CKMBINDEX", "TROPONINI" in the last 168 hours.  BNP (last 3 results) Recent Labs    08/20/23 0056  BNP 749.9*    ProBNP (last 3 results) No results for input(s): "PROBNP" in the last 8760 hours.  CBG: No results for input(s): "GLUCAP" in the last 168 hours.  Radiological Exams on Admission: DG Chest Port 1 View Result Date: 08/20/2023 CLINICAL DATA:  Shortness of breath EXAM: PORTABLE CHEST 1 VIEW COMPARISON:  05/14/2021 FINDINGS: Mild cardiomegaly with small left and small moderate right pleural effusion. Bibasilar airspace disease. Aortic atherosclerosis. No pneumothorax IMPRESSION: Mild cardiomegaly with small left and small to moderate right pleural effusions. Bibasilar airspace disease, atelectasis versus pneumonia. Electronically Signed   By: Jasmine Pang M.D.   On: 08/20/2023 00:07    EKG: I independently viewed the EKG done and my findings are as followed: Atrial fibrillation rate of 130.  Nonspecific ST changes.  QTC 498  Assessment/Plan Present on Admission:  Atrial fibrillation with rapid ventricular response (HCC)  Principal Problem:   Atrial fibrillation with rapid ventricular response (HCC)  Paroxysmal  atrial fibrillation with rapid ventricular response Not anticoagulated prior  to admission Previously on Xarelto, per medical records, this was stopped due to frequent falls On diltiazem drip for rate control Follow 2D echo Monitor on telemetry  Acute on chronic HFpEF Presented with BNP greater than 740, bilateral lower extremity edema, pulmonary edema and bilateral pleural effusions seen on chest x-ray Start IV diuresing when blood pressure allows and after potassium is repleted Start strict I's and O's and daily weight Follow 2D echo  Hypokalemia Presented with serum potassium 3.1 Repleted intravenously Repeat BMP and check magnesium Nunez.  Generalized weakness Physical debility Per daughter at bedside the patient is wheelchair-bound Has been spending more time in bed recently The patient will benefit from palliative care evaluation to assist with establishing goals of care.  Severe protein calorie malnutrition Severe muscle mass loss Albumin 2.1 BMI 22 Per her daughter has had poor appetite and oral intake Encourage increase protein calorie intake  Hyperlipidemia Resume home Lipitor    Critical care time: 65 minutes.    DVT prophylaxis: Subcu Lovenox daily.  Code Status: Full code, as stated by the patient's daughter at bedside.  Family Communication: Daughter at bedside.  Disposition Plan: Admitted to telemetry cardiac unit.  Consults called: None.  Admission status: Inpatient status.   Status is: Inpatient The patient requires at least 2 midnights for further evaluation and treatment of present condition.   Darlin Drop MD [facility] Pager [phone]  If 7PM-7AM, please contact night-coverage [email or web address] Password [email or web address]  08/20/2023, 4:08 AM

## 2023-08-20 NOTE — Assessment & Plan Note (Signed)
 08-20-2023 replete with po kcl 40 meq q4h x 3 doses

## 2023-08-20 NOTE — Progress Notes (Signed)
 PROGRESS NOTE    Holly Nunez  ZOX:[id number] DOB: 02-16-1925 DOA: 08/19/2023 PCP: Default, Provider, MD  Subjective: Pt seen and examined. Discussed with pt's dtr Holly Nunez who is at bedside. Pt has lives at [facility] for about 3-4 years now. Pt is wheelchair/bedbound. Admitted for rapid afib. Has known hx of afib. Not on systemic anticoagulation due to fall risks.  Pt no longer walking but hx of psoas hematoma while on systemic anticoagulation.  HR now controlled on IV cardizem CD. Discussed with dtr Holly Nunez. Shared decision making. I think that risk of bleeding from systemic anticoagulation outweigh any benefits gain from CVA prophylaxis in this 89 yo AAF who is bedridden.   Hospital Course: HPI:  Holly Nunez is a 89 y.o. female with medical history significant for chronic HFpEF, paroxysmal A-fib not on oral anticoagulation, previously on Xarelto which was stopped due to history of frequent falls, hyperlipidemia, who presents to the ER via EMS from SNF due to shortness of breath of a few days duration, worse for the past day.  Per her daughter at bedside, she has had a decline in her appetite and has been generally weak spending most time in bed.   In the ER, tachycardic in A-fib with RVR.  Chest x-ray revealed pulmonary edema and bilateral pleural effusions.  Cardizem drip was initiated.  TRH, hospitalist service, was asked to admit.   At the time of this visit, the patient is somnolent but is arousable to voices.  She has no complaints.  Her daughter is present at bedside who confirms full code CODE STATUS.   ED Course: Temperature 97.6.  BP 108/58, pulse 125, respiration rate 10, O2 saturation 100% on 2 L.  Lab studies notable for serum potassium 3.1, serum bicarb 34, glucose 116, albumin 2.1, BNP 749.  Significant Events: Admitted 08/19/2023 for afib with RVR   Significant Labs: WBC 6.6, HgB 11.7, plt 170 Na 141, K 3.1, CO2 of 34, BUN 11, Scr 0.86, glu 116 BNP  749 Covid/flu/rsv Negative   Significant Imaging Studies: CXR Mild cardiomegaly with small left and small to moderate right pleural effusions. Bibasilar airspace disease,  atelectasis versus pneumonia.  Antibiotic Therapy: Anti-infectives (From admission, onward)    None       Procedures:   Consultants:     Assessment and Plan: * Atrial fibrillation with rapid ventricular response (HCC) 08-20-2023 HR now controled with cardizem gtts. Check echo. Start cardizem CD 120 mg daily. Has known hx of afib. Not on systemic anticoagulation due to fall risks. Pt no longer walking but hx of psoas hematoma while on systemic anticoagulation.HR now controlled on IV cardizem CD. Discussed with dtr Holly Nunez. Shared decision making. I think that risk of bleeding from systemic anticoagulation outweigh any benefits gain from CVA prophylaxis in this 89 yo AAF who is bedridden.  Therefore, dtr Holly Nunez agrees not to start systemic anticoagulation.  Pleural effusion, right 08-20-2023 bedside thoracic U/S shows 500-750 ml right pleural effusion. 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  Continue with IV lasix. Order right thoracentesis by IR. Dtr is agreeable.  Hypomagnesemia 08-20-2023 replete with IV Mg  Hypokalemia 08-20-2023 replete with po kcl 40 meq q4h x 3 doses  CKD stage 3a, GFR 45-59 ml/min (HCC) 08-20-2023 stable Scr 0.80.  baseline Scr 1.1-1.3  Essential hypertension 08-20-2023 in looking at pt's [facility] from SNF, she is not on any HTN meds. Starting cardizem-CD 120 mg daily for HR control.  DVT prophylaxis: enoxaparin (LOVENOX)  injection 30 mg Start: 08/21/23 1000    Code Status: Full Code Family Communication: discussed at bedside with dtr Holly Nunez. Verified FULL CODE status with dtr Holly Nunez Disposition Plan: return to [facility]. Pt is a long term resident there.  Reason for continuing need for hospitalization: continue with IV lasix,  right side thoracentesis for pleural effusion. Checking echo.  Objective: Vitals:   08/20/23 0623 08/20/23 0629 08/20/23 0632 08/20/23 0700  BP:  105/67  116/70  Pulse: 70   74  Resp:  12  16  Temp:   97.6 F (36.4 C)   TempSrc:   Oral   SpO2:  100%  100%  Weight:      Height:        Intake/Output Summary (Last 24 hours) at 08/20/2023 0941 Last data filed at 08/20/2023 0622 Gross per 24 hour  Intake 284.42 ml  Output --  Net 284.42 ml   Filed Weights   08/19/23 2356  Weight: 51.1 kg    Examination:  Physical Exam Vitals and nursing note reviewed.  Constitutional:      General: She is not in acute distress.    Appearance: She is not toxic-appearing.     Comments: Chronically ill appearing  HENT:     Head: Normocephalic and atraumatic.  Eyes:     General: No scleral icterus. Cardiovascular:     Rate and Rhythm: Normal rate. Rhythm irregular.  Pulmonary:     Effort: Pulmonary effort is normal.     Breath sounds: Normal breath sounds.  Abdominal:     General: Bowel sounds are normal.     Palpations: Abdomen is soft.  Musculoskeletal:     Right lower leg: Edema present.     Left lower leg: Edema present.     Comments: +1 pitting LE bilateral pretibial edema  Skin:    General: Skin is warm and dry.     Capillary Refill: Capillary refill takes less than 2 seconds.  Neurological:     General: No focal deficit present.     Data Reviewed: I have personally reviewed following labs and imaging studies  CBC: Recent Labs  Lab 08/20/23 0056 08/20/23 0546  WBC 6.6 6.8  NEUTROABS 5.0  --   HGB 11.7* 10.6*  HCT 35.8* 32.6*  MCV 94.0 96.4  PLT 170 168   Basic Metabolic Panel: Recent Labs  Lab 08/20/23 0056 08/20/23 0546  NA 141 140  K 3.1* 3.4*  CL 97* 99  CO2 34* 32  GLUCOSE 116* 106*  BUN 11 11  CREATININE 0.86 0.80  CALCIUM 8.7* 8.3*  MG  --  1.5*  PHOS  --  2.6   GFR: Estimated Creatinine Clearance: 29.5 mL/min (by C-G formula based on SCr of  0.8 mg/dL). Liver Function Tests: Recent Labs  Lab 08/20/23 0056  AST 30  ALT 15  ALKPHOS 52  BILITOT 0.8  PROT 5.0*  ALBUMIN 2.1*   BNP (last 3 results) Recent Labs    08/20/23 0056  BNP 749.9*   Recent Results (from the past 240 hours)  Resp panel by RT-PCR (RSV, Flu A&B, Covid) Anterior Nasal Swab     Status: None   Collection Time: 08/19/23 11:42 PM   Specimen: Anterior Nasal Swab  Result Value Ref Range Status   SARS Coronavirus 2 by RT PCR NEGATIVE NEGATIVE Final   Influenza A by PCR NEGATIVE NEGATIVE Final   Influenza B by PCR NEGATIVE NEGATIVE Final    Comment: (NOTE)  The Xpert Xpress SARS-CoV-2/FLU/RSV plus assay is intended as an aid in the diagnosis of influenza from Nasopharyngeal swab specimens and should not be used as a sole basis for treatment. Nasal washings and aspirates are unacceptable for Xpert Xpress SARS-CoV-2/FLU/RSV testing.  Fact Sheet for Patients: BloggerCourse.com  Fact Sheet for Healthcare Providers: SeriousBroker.it  This test is not yet approved or cleared by the [place] FDA and has been authorized for detection and/or diagnosis of SARS-CoV-2 by FDA under an Emergency Use Authorization (EUA). This EUA will remain in effect (meaning this test can be used) for the duration of the COVID-19 declaration under Section 564(b)(1) of the Act, 21 U.S.C. section 360bbb-3(b)(1), unless the authorization is terminated or revoked.     Resp Syncytial Virus by PCR NEGATIVE NEGATIVE Final    Comment: (NOTE) Fact Sheet for Patients: BloggerCourse.com  Fact Sheet for Healthcare Providers: SeriousBroker.it  This test is not yet approved or cleared by the [place] FDA and has been authorized for detection and/or diagnosis of SARS-CoV-2 by FDA under an Emergency Use Authorization (EUA). This EUA will remain in effect (meaning this test can  be used) for the duration of the COVID-19 declaration under Section 564(b)(1) of the Act, 21 U.S.C. section 360bbb-3(b)(1), unless the authorization is terminated or revoked.  Performed at [facility], [address] [place], [place] [address]      Radiology Studies: DG Chest Port 1 View Result Date: 08/20/2023 CLINICAL DATA:  Shortness of breath EXAM: PORTABLE CHEST 1 VIEW COMPARISON:  05/14/2021 FINDINGS: Mild cardiomegaly with small left and small moderate right pleural effusion. Bibasilar airspace disease. Aortic atherosclerosis. No pneumothorax IMPRESSION: Mild cardiomegaly with small left and small to moderate right pleural effusions. Bibasilar airspace disease, atelectasis versus pneumonia. Electronically Signed   By: Jasmine Pang M.D.   On: 08/20/2023 00:07    Scheduled Meds:  aspirin EC  81 mg Oral q AM   atorvastatin  20 mg Oral Daily   brimonidine  1 drop Both Eyes BID   And   timolol  1 drop Both Eyes BID   diltiazem  120 mg Oral Daily   [START ON 08/21/2023] enoxaparin (LOVENOX) injection  30 mg Subcutaneous Daily   folic acid  1 mg Oral Daily   latanoprost  1 drop Both Eyes QHS   Muscle Rub  1 Application Topical TID   Continuous Infusions:   LOS: 0 days   Time spent: 45 minutes  Carollee Herter, DO  [facility]  08/20/2023, 9:41 AM

## 2023-08-20 NOTE — Hospital Course (Signed)
 HPI:  Holly Nunez is a 89 y.o. female with medical history significant for chronic HFpEF, paroxysmal A-fib not on oral anticoagulation, previously on Xarelto which was stopped due to history of frequent falls, hyperlipidemia, who presents to the ER via EMS from SNF due to shortness of breath of a few days duration, worse for the past day.  Per her daughter at bedside, she has had a decline in her appetite and has been generally weak spending most time in bed.   In the ER, tachycardic in A-fib with RVR.  Chest x-ray revealed pulmonary edema and bilateral pleural effusions.  Cardizem drip was initiated.  TRH, hospitalist service, was asked to admit.   At the time of this visit, the patient is somnolent but is arousable to voices.  She has no complaints.  Her daughter is present at bedside who confirms full code CODE STATUS.   ED Course: Temperature 97.6.  BP 108/58, pulse 125, respiration rate 10, O2 saturation 100% on 2 L.  Lab studies notable for serum potassium 3.1, serum bicarb 34, glucose 116, albumin 2.1, BNP 749.  Significant Events: Admitted 08/19/2023 for afib with RVR   Significant Labs: WBC 6.6, HgB 11.7, plt 170 Na 141, K 3.1, CO2 of 34, BUN 11, Scr 0.86, glu 116 BNP 749 Covid/flu/rsv Negative   Significant Imaging Studies: CXR Mild cardiomegaly with small left and small to moderate right pleural effusions. Bibasilar airspace disease,  atelectasis versus pneumonia.  Antibiotic Therapy: Anti-infectives (From admission, onward)    None       Procedures:   Consultants:

## 2023-08-21 ENCOUNTER — Inpatient Hospital Stay (HOSPITAL_COMMUNITY)

## 2023-08-21 DIAGNOSIS — I509 Heart failure, unspecified: Secondary | ICD-10-CM | POA: Diagnosis not present

## 2023-08-21 DIAGNOSIS — D649 Anemia, unspecified: Secondary | ICD-10-CM | POA: Diagnosis not present

## 2023-08-21 DIAGNOSIS — I4891 Unspecified atrial fibrillation: Secondary | ICD-10-CM | POA: Diagnosis not present

## 2023-08-21 DIAGNOSIS — J90 Pleural effusion, not elsewhere classified: Secondary | ICD-10-CM | POA: Diagnosis not present

## 2023-08-21 DIAGNOSIS — I5033 Acute on chronic diastolic (congestive) heart failure: Secondary | ICD-10-CM

## 2023-08-21 LAB — COMPREHENSIVE METABOLIC PANEL WITH GFR
ALT: 16 U/L (ref 0–44)
AST: 33 U/L (ref 15–41)
Albumin: 2.1 g/dL — ABNORMAL LOW (ref 3.5–5.0)
Alkaline Phosphatase: 57 U/L (ref 38–126)
Anion gap: 7 (ref 5–15)
BUN: 11 mg/dL (ref 8–23)
CO2: 33 mmol/L — ABNORMAL HIGH (ref 22–32)
Calcium: 8.5 mg/dL — ABNORMAL LOW (ref 8.9–10.3)
Chloride: 100 mmol/L (ref 98–111)
Creatinine, Ser: 0.7 mg/dL (ref 0.44–1.00)
GFR, Estimated: >60 mL/min (ref >60)
Glucose, Bld: 114 mg/dL — ABNORMAL HIGH (ref 70–99)
Potassium: 5.8 mmol/L — ABNORMAL HIGH (ref 3.5–5.1)
Sodium: 140 mmol/L (ref 135–145)
Total Bilirubin: 0.9 mg/dL (ref 0.0–1.2)
Total Protein: 5.2 g/dL — ABNORMAL LOW (ref 6.5–8.1)

## 2023-08-21 LAB — ECHOCARDIOGRAM COMPLETE
Area-P 1/2: 4.31 cm2
Calc EF: 39.6 %
Height: 60 in
MV VTI: 1.01 cm2
S' Lateral: 2.6 cm
Single Plane A2C EF: 30.2 %
Single Plane A4C EF: 44.7 %
Weight: 1809.54 [oz_av]

## 2023-08-21 LAB — MAGNESIUM: Magnesium: 1.8 mg/dL (ref 1.7–2.4)

## 2023-08-21 LAB — PROTEIN, PLEURAL OR PERITONEAL FLUID: Total protein, fluid: <3 g/dL

## 2023-08-21 LAB — POTASSIUM
Potassium: 6.1 mmol/L — ABNORMAL HIGH (ref 3.5–5.1)
Potassium: 5.6 mmol/L — ABNORMAL HIGH (ref 3.5–5.1)

## 2023-08-21 MED ORDER — SODIUM ZIRCONIUM CYCLOSILICATE 10 G PO PACK
10.0000 g | PACK | Freq: Once | ORAL | Status: AC
  Administered 2023-08-21: 10 g via ORAL
  Filled 2023-08-21: qty 1

## 2023-08-21 MED ORDER — TRAMADOL HCL 50 MG PO TABS
50.0000 mg | ORAL_TABLET | Freq: Once | ORAL | Status: AC | PRN
  Administered 2023-08-21: 50 mg via ORAL
  Filled 2023-08-21: qty 1

## 2023-08-21 MED ORDER — SODIUM ZIRCONIUM CYCLOSILICATE 10 G PO PACK
10.0000 g | PACK | ORAL | Status: AC
  Administered 2023-08-21 (×2): 10 g via ORAL
  Filled 2023-08-21: qty 1

## 2023-08-21 MED ORDER — DILTIAZEM HCL 30 MG PO TABS: 30.0000 mg | ORAL_TABLET | Freq: Three times a day (TID) | ORAL | Status: DC

## 2023-08-21 MED ORDER — DILTIAZEM HCL 30 MG PO TABS
30.0000 mg | ORAL_TABLET | Freq: Three times a day (TID) | ORAL | Status: DC
  Administered 2023-08-21 – 2023-08-22 (×3): 30 mg via ORAL
  Filled 2023-08-21 (×3): qty 1

## 2023-08-21 MED ORDER — METOPROLOL TARTRATE 5 MG/5ML IV SOLN: 2.5000 mg | Freq: Four times a day (QID) | INTRAVENOUS | Status: DC | PRN

## 2023-08-21 NOTE — TOC Progression Note (Signed)
 Transition of Care [facility]) - Initial/Assessment Note    Patient Details  Name: Holly Nunez MRN: [id number] Date of Birth: 12/13/1924  Transition of Care [facility]) CM/SW Contact:    Ralene Bathe, LCSW Phone Number: 08/21/2023, 12:12 PM  Clinical Narrative:                 CSW contacted [place] at [employer].  Patient can return when medically ready.  TOC will continue to follow.      Patient Goals and CMS Choice            Expected Discharge Plan and Services                                              Prior Living Arrangements/Services                       Activities of Daily Living      Permission Sought/Granted                  Emotional Assessment              Admission diagnosis:  Atrial fibrillation with rapid ventricular response (HCC) [I48.91] Atrial fibrillation with RVR (HCC) [I48.91] Acute on chronic congestive heart failure, unspecified heart failure type (HCC) [I50.9] Patient Active Problem List   Diagnosis Date Noted   Atrial fibrillation with rapid ventricular response (HCC) 08/20/2023   Hypokalemia 08/20/2023   Hypomagnesemia 08/20/2023   Pleural effusion, right 08/20/2023   Pressure injury of skin 07/16/2023   History of UTI 04/04/2023   Cognitive deficits 05/18/2021   Pancreatic mass 05/18/2021   Abnormal ECG 05/15/2021   CKD stage 3a, GFR 45-59 ml/min (HCC) 05/14/2021   Unspecified atrial fibrillation (HCC) 05/14/2021   Pancreatic pseudocyst/cyst 05/14/2021   History of TIA (transient ischemic attack) 04/16/2020   Dyspepsia 08/21/2019   Debility 06/21/2017   Great toe pain, left 12/02/2016   Vitamin D deficiency 12/02/2016   Estrogen deficiency 05/27/2016   Overactive bladder 12/07/2015   Allergy 12/07/2015   Multinodular goiter (nontoxic) 04/25/2015   History of chicken pox    Essential hypertension 02/19/2014   Hyperlipidemia 02/19/2014   DJD (degenerative joint disease) 02/19/2014   S/P  hysterectomy 02/19/2014   Normocytic anemia 02/19/2014   Urinary incontinence 02/19/2014   PCP:  Default, Provider, MD Pharmacy:   [facility] - [place], [place] - [address] [place] [address] Phone: [phone] Fax: [phone]     Social Drivers of Health (SDOH) Social History: SDOH Screenings   Food Insecurity: No Food Insecurity (08/21/2023)  Housing: Low Risk  (08/21/2023)  Transportation Needs: No Transportation Needs (08/21/2023)  Utilities: Not At Risk (08/21/2023)  Alcohol Screen: Low Risk  (01/29/2021)  Depression (PHQ2-9): Low Risk  (01/29/2021)  Financial Resource Strain: Low Risk  (01/29/2021)  Physical Activity: Inactive (01/29/2021)  Social Connections: Patient Unable To Answer (08/21/2023)  Stress: No Stress Concern Present (01/29/2021)  Tobacco Use: Low Risk  (08/19/2023)   SDOH Interventions:     Readmission Risk Interventions     No data to display

## 2023-08-21 NOTE — Progress Notes (Signed)
  Echocardiogram 2D Echocardiogram has been performed.  Holly Nunez 08/21/2023, 2:12 PM

## 2023-08-21 NOTE — Progress Notes (Signed)
 [facility] PROGRESS NOTE   Holly Nunez ZOX:[id number] DOB: May 23, 1924 DOA: 08/19/2023  PCP: Default, Provider, MD  Brief History: 89 y.o. female with medical history significant for chronic HFpEF, paroxysmal A-fib not on oral anticoagulation, previously on Xarelto which was stopped due to history of frequent falls, hyperlipidemia, who presented to the ER via EMS from SNF due to shortness of breath of a few days duration, worse for the past day.  Patient was noted to be in atrial fibrillation with RVR.  Found to have pleural effusions.  She was hospitalized for further management.    Consultants: None  Procedures: Thoracentesis    Subjective/Interval History: Patient lying flat on the bed.  Noted to be on oxygen.  Patient denies any chest pain or shortness of breath.  Denies any dizziness or lightheadedness.  No nausea or vomiting.  Her daughter is at the bedside.    Assessment/Plan:  Paroxysmal atrial fibrillation with RVR Previous history of same.  Not on anticoagulation due to frequent falls. Patient was started on diltiazem infusion.  Looks like she is converted to sinus rhythm. Noted to be on oral diltiazem.  However blood pressures were noted to be low.  Dose to be held this morning. Continue to monitor on telemetry for now. Echocardiogram is pending.  Bilateral pleural effusion right more than left Underwent right-sided thoracentesis on 4/5 yielding 5 and 50 mL of cloudy yellow-colored fluid. Fluid analysis reviewed.  No significant nucleated cells noted.  LDH was 68.  Unfortunately no serum LDH has been checked.  No pleural fluid protein level has been checked.  But it does appear to be a transudative process.   No organisms noted on Gram stain. Symptomatic improvement after thoracentesis noted. Noted to be on oxygen.  Hopefully will be able to wean it off.  Acute on chronic diastolic CHF Echocardiogram is pending.  Patient was given furosemide.  Drop in blood  pressure noted.  Furosemide currently on hold. Renal function is stable. No pulmonary edema noted on chest x-ray.  Hypokalemia Potassium was 3.4 yesterday.  Aggressively repleted with potassium of 5.8 this morning.  Will recheck potassium levels since this is iatrogenic hyperkalemia.  Normocytic anemia No evidence of overt bleeding.  Drop in hemoglobin is likely dilutional.  Recheck labs tomorrow morning.  Hypomagnesemia Improved.  Chronic kidney disease stage IIIa Stable.  Essential hypertension Low blood pressures noted as mentioned above.  She was on long-acting Cardizem presumably for her atrial fibrillation which is currently changed to short acting Cardizem.  On hold this morning due to lowish blood pressures.  Continue to monitor closely.  DVT Prophylaxis: Lovenox Code Status: Full code Family Communication: Discussed with daughter Disposition Plan: From SNF  Status is: Inpatient Remains inpatient appropriate because: Hypertension, atrial fibrillation, diastolic CHF      Medications: Scheduled:  aspirin EC  81 mg Oral q AM   atorvastatin  20 mg Oral Daily   brimonidine  1 drop Both Eyes BID   And   timolol  1 drop Both Eyes BID   diltiazem  30 mg Oral Q8H   enoxaparin (LOVENOX) injection  30 mg Subcutaneous Daily   folic acid  1 mg Oral Daily   latanoprost  1 drop Both Eyes QHS   lidocaine (PF)  1 mL Other Once   Muscle Rub  1 Application Topical TID   Continuous: WJX:[id number], melatonin, metoprolol tartrate, polyethylene glycol, prochlorperazine  Antibiotics: Anti-infectives (From admission, onward)    None  Objective:  Vital Signs  Vitals:   08/21/23 0300 08/21/23 0430 08/21/23 0500 08/21/23 0745  BP: (!) 91/54 (!) 100/56  (!) 105/59  Pulse:    (!) 28  Resp: 18 17  18   Temp: 97.8 F (36.6 C)   97.9 F (36.6 C)  TempSrc: Oral   Oral  SpO2:    92%  Weight:   51.3 kg   Height:        Intake/Output Summary (Last 24 hours) at  08/21/2023 0946 Last data filed at 08/21/2023 0300 Gross per 24 hour  Intake 41.47 ml  Output 800 ml  Net -758.53 ml   Filed Weights   08/19/23 2356 08/21/23 0500  Weight: 51.1 kg 51.3 kg    General appearance: Awake alert.  In no distress Resp: Normal effort at rest.  Good air entry bilaterally with no crackles appreciated.  No wheezing or rhonchi. Cardio: S1-S2 is normal regular.  No S3-S4.  No rubs murmurs or bruit GI: Abdomen is soft.  Nontender nondistended.  Bowel sounds are present normal.  No masses organomegaly Extremities: No edema per daughter.  Physical deconditioning noted.  According to daughter she is pretty much bedbound Neurologic:   No focal neurological deficits.    Lab Results:  Data Reviewed: I have personally reviewed following labs and reports of the imaging studies  CBC: Recent Labs  Lab 08/20/23 0056 08/20/23 0546  WBC 6.6 6.8  NEUTROABS 5.0  --   HGB 11.7* 10.6*  HCT 35.8* 32.6*  MCV 94.0 96.4  PLT 170 168    Basic Metabolic Panel: Recent Labs  Lab 08/20/23 0056 08/20/23 0546 08/21/23 0320  NA 141 140 140  K 3.1* 3.4* 5.8*  CL 97* 99 100  CO2 34* 32 33*  GLUCOSE 116* 106* 114*  BUN 11 11 11   CREATININE 0.86 0.80 0.70  CALCIUM 8.7* 8.3* 8.5*  MG  --  1.5* 1.8  PHOS  --  2.6  --     GFR: Estimated Creatinine Clearance: 29.5 mL/min (by C-G formula based on SCr of 0.7 mg/dL).  Liver Function Tests: Recent Labs  Lab 08/20/23 0056 08/21/23 0320  AST 30 33  ALT 15 16  ALKPHOS 52 57  BILITOT 0.8 0.9  PROT 5.0* 5.2*  ALBUMIN 2.1* 2.1*    No results for input(s): "LIPASE", "AMYLASE" in the last 168 hours. No results for input(s): "AMMONIA" in the last 168 hours.  Coagulation Profile: No results for input(s): "INR", "PROTIME" in the last 168 hours.  Cardiac Enzymes: No results for input(s): "CKTOTAL", "CKMB", "CKMBINDEX", "TROPONINI" in the last 168 hours.  BNP (last 3 results) No results for input(s): "PROBNP" in the last  8760 hours.  HbA1C: No results for input(s): "HGBA1C" in the last 72 hours.  CBG: No results for input(s): "GLUCAP" in the last 168 hours.  Lipid Profile: No results for input(s): "CHOL", "HDL", "LDLCALC", "TRIG", "CHOLHDL", "LDLDIRECT" in the last 72 hours.  Thyroid Function Tests: No results for input(s): "TSH", "T4TOTAL", "FREET4", "T3FREE", "THYROIDAB" in the last 72 hours.  Anemia Panel: No results for input(s): "VITAMINB12", "FOLATE", "FERRITIN", "TIBC", "IRON", "RETICCTPCT" in the last 72 hours.  Recent Results (from the past 240 hours)  Resp panel by RT-PCR (RSV, Flu A&B, Covid) Anterior Nasal Swab     Status: None   Collection Time: 08/19/23 11:42 PM   Specimen: Anterior Nasal Swab  Result Value Ref Range Status   SARS Coronavirus 2 by RT PCR NEGATIVE NEGATIVE Final   Influenza  A by PCR NEGATIVE NEGATIVE Final   Influenza B by PCR NEGATIVE NEGATIVE Final    Comment: (NOTE) The Xpert Xpress SARS-CoV-2/FLU/RSV plus assay is intended as an aid in the diagnosis of influenza from Nasopharyngeal swab specimens and should not be used as a sole basis for treatment. Nasal washings and aspirates are unacceptable for Xpert Xpress SARS-CoV-2/FLU/RSV testing.  Fact Sheet for Patients: BloggerCourse.com  Fact Sheet for Healthcare Providers: SeriousBroker.it  This test is not yet approved or cleared by the [place] FDA and has been authorized for detection and/or diagnosis of SARS-CoV-2 by FDA under an Emergency Use Authorization (EUA). This EUA will remain in effect (meaning this test can be used) for the duration of the COVID-19 declaration under Section 564(b)(1) of the Act, 21 U.S.C. section 360bbb-3(b)(1), unless the authorization is terminated or revoked.     Resp Syncytial Virus by PCR NEGATIVE NEGATIVE Final    Comment: (NOTE) Fact Sheet for Patients: BloggerCourse.com  Fact Sheet for  Healthcare Providers: SeriousBroker.it  This test is not yet approved or cleared by the [place] FDA and has been authorized for detection and/or diagnosis of SARS-CoV-2 by FDA under an Emergency Use Authorization (EUA). This EUA will remain in effect (meaning this test can be used) for the duration of the COVID-19 declaration under Section 564(b)(1) of the Act, 21 U.S.C. section 360bbb-3(b)(1), unless the authorization is terminated or revoked.  Performed at [facility], [address] [place], [place] [address]   Body fluid culture w Gram Stain     Status: None (Preliminary result)   Collection Time: 08/20/23 12:38 PM   Specimen: Lung, Right; Pleural Fluid  Result Value Ref Range Status   Specimen Description PLEURAL  Final   Special Requests right lung  Final   Gram Stain NO WBC SEEN NO ORGANISMS SEEN   Final   Culture   Final    NO GROWTH < 24 HOURS Performed at [facility], [address] [place], [place] [address]    Report Status PENDING  Incomplete      Radiology Studies: US THORACENTESIS ASP PLEURAL SPACE W/IMG GUIDE Result Date: 08/20/2023 INDICATION: 89 year old female. History of CHF, A fib. Presented to the ED with shortness of breath. Found to have a right-sided pleural effusion. Request is for therapeutic and diagnostic thoracentesis EXAM: ULTRASOUND GUIDED THERAPEUTIC AND DIAGNOSTIC RIGHT-SIDED THORACENTESIS MEDICATIONS: LIDOCAINE 1% 10 ML COMPLICATIONS: None immediate. PROCEDURE: An ultrasound guided thoracentesis was thoroughly discussed with the patient and questions answered. The benefits, risks, alternatives and complications were also discussed. The patient understands and wishes to proceed with the procedure. Written consent was obtained. Ultrasound was performed to localize and mark an adequate pocket of fluid in the right chest. The area was then prepped and draped in the normal sterile fashion. 1% Lidocaine was  used for local anesthesia. Under ultrasound guidance a 6 Fr Safe-T-Centesis catheter was introduced. Thoracentesis was performed. The catheter was removed and a dressing applied. FINDINGS: A total of approximately 550 ML of cloudy yellow fluid was removed. Samples were sent to the laboratory as requested by the clinical team. IMPRESSION: Successful ultrasound guided therapeutic and diagnostic right-sided thoracentesis yielding 550 mL of pleural fluid. Performed by Anders Grant NP Electronically Signed   By: Gilmer Mor D.O.   On: 08/20/2023 14:23   DG Chest 1 View Result Date: 08/20/2023 CLINICAL DATA:  [id number] S/P thoracentesis [id number] EXAM: CHEST  1 VIEW COMPARISON:  Chest x-ray 08/19/2023, ultrasound thoracentesis 08/20/2023 FINDINGS:  The heart and mediastinal contours are unchanged. Atherosclerotic plaque. No focal consolidation. No pulmonary edema. At least small left pleural effusion. At least trace right pleural effusion. No pneumothorax. No acute osseous abnormality. IMPRESSION: 1.  At least small left pleural effusion. 2. At least trace right pleural effusion. Electronically Signed   By: Tish Frederickson M.D.   On: 08/20/2023 13:01   DG Chest Port 1 View Result Date: 08/20/2023 CLINICAL DATA:  Shortness of breath EXAM: PORTABLE CHEST 1 VIEW COMPARISON:  05/14/2021 FINDINGS: Mild cardiomegaly with small left and small moderate right pleural effusion. Bibasilar airspace disease. Aortic atherosclerosis. No pneumothorax IMPRESSION: Mild cardiomegaly with small left and small to moderate right pleural effusions. Bibasilar airspace disease, atelectasis versus pneumonia. Electronically Signed   By: Jasmine Pang M.D.   On: 08/20/2023 00:07       LOS: 1 day   Priyansh Pry  [facility] Pager on [email or web address]  08/21/2023, 9:46 AM

## 2023-08-21 NOTE — Progress Notes (Signed)
 BP 89/50, MAP 63, other vitals WDL. Pt asymptomatic, no change in mental status. Lasix 20mg  given at 2147. Opyd, MD notified. RN instructed to recheck BP in an hour and notify MD if MAP<65.

## 2023-08-21 NOTE — Consult Note (Signed)
 Please note that the [facility] nursing team is utilizing a standardized work plan to manage patient consults. We are triaging consults and will try to see the patients within 48 hours. Wound photos in the patient's chart allow [place] to consult on the patient in the most efficient and timely manner.    Guneet Delpino [facility], CNS, [employer] [phone]

## 2023-08-22 DIAGNOSIS — I4891 Unspecified atrial fibrillation: Secondary | ICD-10-CM | POA: Diagnosis not present

## 2023-08-22 LAB — CBC
HCT: 32 % — ABNORMAL LOW (ref 36.0–46.0)
Hemoglobin: 10.2 g/dL — ABNORMAL LOW (ref 12.0–15.0)
MCH: 31.2 pg (ref 26.0–34.0)
MCHC: 31.9 g/dL (ref 30.0–36.0)
MCV: 97.9 fL (ref 80.0–100.0)
Platelets: 138 10*3/uL — ABNORMAL LOW (ref 150–400)
RBC: 3.27 MIL/uL — ABNORMAL LOW (ref 3.87–5.11)
RDW: 18.4 % — ABNORMAL HIGH (ref 11.5–15.5)
WBC: 5.6 10*3/uL (ref 4.0–10.5)
nRBC: 0 % (ref 0.0–0.2)

## 2023-08-22 LAB — BASIC METABOLIC PANEL WITH GFR
Anion gap: 8 (ref 5–15)
BUN: 12 mg/dL (ref 8–23)
CO2: 27 mmol/L (ref 22–32)
Calcium: 8.1 mg/dL — ABNORMAL LOW (ref 8.9–10.3)
Chloride: 102 mmol/L (ref 98–111)
Creatinine, Ser: 0.8 mg/dL (ref 0.44–1.00)
GFR, Estimated: >60 mL/min (ref >60)
Glucose, Bld: 113 mg/dL — ABNORMAL HIGH (ref 70–99)
Potassium: 5.9 mmol/L — ABNORMAL HIGH (ref 3.5–5.1)
Sodium: 137 mmol/L (ref 135–145)

## 2023-08-22 LAB — LACTATE DEHYDROGENASE: LDH: 222 U/L — ABNORMAL HIGH (ref 98–192)

## 2023-08-22 LAB — POTASSIUM: Potassium: 4.5 mmol/L (ref 3.5–5.1)

## 2023-08-22 MED ORDER — METOPROLOL TARTRATE 25 MG PO TABS
25.0000 mg | ORAL_TABLET | Freq: Two times a day (BID) | ORAL | Status: DC
Start: 2023-08-22 — End: 2023-08-23
  Administered 2023-08-22 – 2023-08-23 (×3): 25 mg via ORAL
  Filled 2023-08-22 (×3): qty 1

## 2023-08-22 MED ORDER — SODIUM ZIRCONIUM CYCLOSILICATE 10 G PO PACK
10.0000 g | PACK | Freq: Once | ORAL | Status: AC
  Administered 2023-08-22: 10 g via ORAL
  Filled 2023-08-22: qty 1

## 2023-08-22 MED ORDER — TORSEMIDE 20 MG PO TABS
20.0000 mg | ORAL_TABLET | Freq: Every day | ORAL | Status: DC
  Administered 2023-08-22 – 2023-08-23 (×2): 20 mg via ORAL
  Filled 2023-08-22 (×2): qty 1

## 2023-08-22 NOTE — Progress Notes (Signed)
 [facility] PROGRESS NOTE   Holly Nunez OZH:[id number] DOB: 05-19-1924 DOA: 08/19/2023  PCP: Default, Provider, MD  Brief History: 89/F w chronic HFpEF, paroxysmal A-fib not on oral anticoagulation, previously on Xarelto which was stopped due to history of frequent falls, gait disorder, hyperlipidemia, who presented to the ER via EMS from SNF due to shortness of breath of a few days duration, worse for the past day.  Patient was noted to be in atrial fibrillation with RVR.  Found to have pleural effusions. -Admitted, started on diuretics, underwent thoracentesis 4/5 -4/6 and 4/7 noted to have hyperkalemia  Subjective/Interval History: -Sleeping this morning, did not get much sleep overnight, granddaughter at bedside, reports that she is breathing better overall  Assessment/Plan:  Paroxysmal atrial fibrillation with RVR Previous history of same.  Not on anticoagulation due to frequent falls. -Started on Cardizem drip on admission, subsequently converted to sinus rhythm, with worsening cardiomyopathy will change oral diltiazem to metoprolol -Supportive care, recommended conservative management only -Discharge planning,  Acute on chronic combined CHF Bilateral pleural effusions R>L -Underwent right-sided thoracentesis on 4/5 yielding 5 and 50 mL of cloudy yellow-colored fluid. -Unfortunately pleural fluid protein was not checked, pleural LDH was 68, will check serum LDH -Suspect this is transudative from CHF, cultures are negative so far -2D echo noted EF of 40-45%, grade 2 DD, normal RV -Start oral torsemide today, continue metoprolol, caution with GDMT in the setting of soft/low blood pressures, advanced age frailty, poor candidate for SGLT2i -Wean off O2  Hyperkalemia -Initially with hypokalemia which was aggressively repleted, repeat potassium remains elevated, despite Lokelma yesterday, repeat Lokelma again today -Also starting torsemide which should help -Repeat lab  later today and in a.m.  Normocytic anemia Mild, now stable  Hypomagnesemia Improved.  Chronic kidney disease stage IIIa Stable.  Essential hypertension -Soft but stable, Cardizem changed to metoprolol  DVT Prophylaxis: Lovenox Code Status: Full code, discussed with patient's daughter and granddaughter, suggested consideration of DNR, they will discuss with patient Family Communication: Discussed with granddaughter at side Disposition Plan: From SNF  Status is: Inpatient Remains inpatient appropriate because: Hypertension, atrial fibrillation, diastolic CHF      Medications: Scheduled:  aspirin EC  81 mg Oral q AM   atorvastatin  20 mg Oral Daily   brimonidine  1 drop Both Eyes BID   And   timolol  1 drop Both Eyes BID   enoxaparin (LOVENOX) injection  30 mg Subcutaneous Daily   folic acid  1 mg Oral Daily   latanoprost  1 drop Both Eyes QHS   lidocaine (PF)  1 mL Other Once   metoprolol tartrate  25 mg Oral BID   Muscle Rub  1 Application Topical TID   sodium zirconium cyclosilicate  10 g Oral Once   torsemide  20 mg Oral Daily   Continuous: GEX:[id number], melatonin, metoprolol tartrate, polyethylene glycol, prochlorperazine  Antibiotics: Anti-infectives (From admission, onward)    None       Objective:  Vital Signs  Vitals:   08/22/23 0500 08/22/23 0754 08/22/23 0909 08/22/23 0914  BP: 124/71 (!) 122/55 127/62 127/62  Pulse:  (!) 59 66 66  Resp: 14 14 13    Temp: (!) 97.3 F (36.3 C) (!) 97.4 F (36.3 C)    TempSrc: Oral Oral    SpO2: 100% 96% 96%   Weight: 52.8 kg     Height:        Intake/Output Summary (Last 24 hours) at 08/22/2023 7253 Last data filed at  08/22/2023 0500 Gross per 24 hour  Intake --  Output 500 ml  Net -500 ml   Filed Weights   08/19/23 2356 08/21/23 0500 08/22/23 0500  Weight: 51.1 kg 51.3 kg 52.8 kg   Chronically ill female laying in bed, sleepy this morning, HEENT: Positive JVD CVS: S1-S2, irregular  rhythm Lungs: Decreased breath sounds at the bases Abdomen: Soft, nontender, bowel sounds present Extremities: 1-2+ edema  Lab Results:  Data Reviewed: I have personally reviewed following labs and reports of the imaging studies  CBC: Recent Labs  Lab 08/20/23 0056 08/20/23 0546 08/22/23 0205  WBC 6.6 6.8 5.6  NEUTROABS 5.0  --   --   HGB 11.7* 10.6* 10.2*  HCT 35.8* 32.6* 32.0*  MCV 94.0 96.4 97.9  PLT 170 168 138*    Basic Metabolic Panel: Recent Labs  Lab 08/20/23 0056 08/20/23 0546 08/21/23 0320 08/21/23 1055 08/21/23 1809 08/22/23 0205  NA 141 140 140  --   --  137  K 3.1* 3.4* 5.8* 6.1* 5.6* 5.9*  CL 97* 99 100  --   --  102  CO2 34* 32 33*  --   --  27  GLUCOSE 116* 106* 114*  --   --  113*  BUN 11 11 11   --   --  12  CREATININE 0.86 0.80 0.70  --   --  0.80  CALCIUM 8.7* 8.3* 8.5*  --   --  8.1*  MG  --  1.5* 1.8  --   --   --   PHOS  --  2.6  --   --   --   --     GFR: Estimated Creatinine Clearance: 29.5 mL/min (by C-G formula based on SCr of 0.8 mg/dL).  Liver Function Tests: Recent Labs  Lab 08/20/23 0056 08/21/23 0320  AST 30 33  ALT 15 16  ALKPHOS 52 57  BILITOT 0.8 0.9  PROT 5.0* 5.2*  ALBUMIN 2.1* 2.1*    No results for input(s): "LIPASE", "AMYLASE" in the last 168 hours. No results for input(s): "AMMONIA" in the last 168 hours.  Coagulation Profile: No results for input(s): "INR", "PROTIME" in the last 168 hours.  Cardiac Enzymes: No results for input(s): "CKTOTAL", "CKMB", "CKMBINDEX", "TROPONINI" in the last 168 hours.  BNP (last 3 results) No results for input(s): "PROBNP" in the last 8760 hours.  HbA1C: No results for input(s): "HGBA1C" in the last 72 hours.  CBG: No results for input(s): "GLUCAP" in the last 168 hours.  Lipid Profile: No results for input(s): "CHOL", "HDL", "LDLCALC", "TRIG", "CHOLHDL", "LDLDIRECT" in the last 72 hours.  Thyroid Function Tests: No results for input(s): "TSH", "T4TOTAL", "FREET4",  "T3FREE", "THYROIDAB" in the last 72 hours.  Anemia Panel: No results for input(s): "VITAMINB12", "FOLATE", "FERRITIN", "TIBC", "IRON", "RETICCTPCT" in the last 72 hours.  Recent Results (from the past 240 hours)  Resp panel by RT-PCR (RSV, Flu A&B, Covid) Anterior Nasal Swab     Status: None   Collection Time: 08/19/23 11:42 PM   Specimen: Anterior Nasal Swab  Result Value Ref Range Status   SARS Coronavirus 2 by RT PCR NEGATIVE NEGATIVE Final   Influenza A by PCR NEGATIVE NEGATIVE Final   Influenza B by PCR NEGATIVE NEGATIVE Final    Comment: (NOTE) The Xpert Xpress SARS-CoV-2/FLU/RSV plus assay is intended as an aid in the diagnosis of influenza from Nasopharyngeal swab specimens and should not be used as a sole basis for treatment. Nasal washings and aspirates  are unacceptable for Xpert Xpress SARS-CoV-2/FLU/RSV testing.  Fact Sheet for Patients: BloggerCourse.com  Fact Sheet for Healthcare Providers: SeriousBroker.it  This test is not yet approved or cleared by the [place] FDA and has been authorized for detection and/or diagnosis of SARS-CoV-2 by FDA under an Emergency Use Authorization (EUA). This EUA will remain in effect (meaning this test can be used) for the duration of the COVID-19 declaration under Section 564(b)(1) of the Act, 21 U.S.C. section 360bbb-3(b)(1), unless the authorization is terminated or revoked.     Resp Syncytial Virus by PCR NEGATIVE NEGATIVE Final    Comment: (NOTE) Fact Sheet for Patients: BloggerCourse.com  Fact Sheet for Healthcare Providers: SeriousBroker.it  This test is not yet approved or cleared by the [place] FDA and has been authorized for detection and/or diagnosis of SARS-CoV-2 by FDA under an Emergency Use Authorization (EUA). This EUA will remain in effect (meaning this test can be used) for the duration of  the COVID-19 declaration under Section 564(b)(1) of the Act, 21 U.S.C. section 360bbb-3(b)(1), unless the authorization is terminated or revoked.  Performed at [facility], [address] [place], [place] [address]   Body fluid culture w Gram Stain     Status: None (Preliminary result)   Collection Time: 08/20/23 12:38 PM   Specimen: Lung, Right; Pleural Fluid  Result Value Ref Range Status   Specimen Description PLEURAL  Final   Special Requests right lung  Final   Gram Stain NO WBC SEEN NO ORGANISMS SEEN   Final   Culture   Final    NO GROWTH < 24 HOURS Performed at [facility], [address], [place], [place] [address]    Report Status PENDING  Incomplete      Radiology Studies: ECHOCARDIOGRAM COMPLETE Result Date: 08/21/2023    ECHOCARDIOGRAM REPORT   Patient Name:   Holly Nunez Date of Exam: 08/21/2023 Medical Rec #:  [id number]      Height:       60.0 in Accession #:    [id number]     Weight:       113.1 lb Date of Birth:  05-20-1924     BSA:          1.465 m Patient Age:    89 years       BP:           105/59 mmHg Patient Gender: F              HR:           65 bpm. Exam Location:  Inpatient Procedure: 2D Echo, Cardiac Doppler and Color Doppler (Both Spectral and Color            Flow Doppler were utilized during procedure). Indications:    I50.40* Unspecified combined systolic (congestive) and diastolic                 (congestive) heart failure  History:        Patient has prior history of Echocardiogram examinations, most                 recent 05/15/2024. Abnormal ECG, TIA, Arrythmias:Atrial                 Fibrillation, Signs/Symptoms:Altered Mental Status, Dyspnea and                 Shortness of Breath; Risk Factors:Hypertension and Dyslipidemia.  Sonographer:    Sheralyn Boatman RDCS Referring Phys: [phone]  CAROLE N HALL IMPRESSIONS  1. Left ventricular ejection fraction, by estimation, is 40 to 45%. The left ventricle has mildly decreased function. The left  ventricle demonstrates global hypokinesis. There is severe left ventricular hypertrophy. Left ventricular diastolic parameters  are consistent with Grade II diastolic dysfunction (pseudonormalization).  2. Right ventricular systolic function is normal. The right ventricular size is normal.  3. Left atrial size was severely dilated.  4. Large pleural effusion in the left lateral region.  5. The mitral valve is normal in structure. Moderate mitral valve regurgitation. No evidence of mitral stenosis. Moderate mitral annular calcification.  6. The aortic valve is normal in structure. There is mild calcification of the aortic valve. There is mild thickening of the aortic valve. Aortic valve regurgitation is not visualized. Aortic valve sclerosis is present, with no evidence of aortic valve stenosis.  7. The inferior vena cava is normal in size with greater than 50% respiratory variability, suggesting right atrial pressure of 3 mmHg.  8. Evidence of atrial level shunting detected by color flow Doppler. FINDINGS  Left Ventricle: Left ventricular ejection fraction, by estimation, is 40 to 45%. The left ventricle has mildly decreased function. The left ventricle demonstrates global hypokinesis. The left ventricular internal cavity size was normal in size. There is  severe left ventricular hypertrophy. Left ventricular diastolic parameters are consistent with Grade II diastolic dysfunction (pseudonormalization). Right Ventricle: The right ventricular size is normal. No increase in right ventricular wall thickness. Right ventricular systolic function is normal. Left Atrium: Left atrial size was severely dilated. Right Atrium: Right atrial size was normal in size. Pericardium: There is no evidence of pericardial effusion. Mitral Valve: The mitral valve is normal in structure. Moderate mitral annular calcification. Moderate mitral valve regurgitation. No evidence of mitral valve stenosis. MV peak gradient, 5.7 mmHg. The mean  mitral valve gradient is 2.0 mmHg. Tricuspid Valve: The tricuspid valve is normal in structure. Tricuspid valve regurgitation is mild . No evidence of tricuspid stenosis. Aortic Valve: The aortic valve is normal in structure. There is mild calcification of the aortic valve. There is mild thickening of the aortic valve. Aortic valve regurgitation is not visualized. Aortic valve sclerosis is present, with no evidence of aortic valve stenosis. Pulmonic Valve: The pulmonic valve was normal in structure. Pulmonic valve regurgitation is not visualized. No evidence of pulmonic stenosis. Aorta: The aortic root is normal in size and structure. Venous: The inferior vena cava is normal in size with greater than 50% respiratory variability, suggesting right atrial pressure of 3 mmHg. IAS/Shunts: Evidence of atrial level shunting detected by color flow Doppler. Additional Comments: There is a large pleural effusion in the left lateral region.  LEFT VENTRICLE PLAX 2D LVIDd:         3.00 cm LVIDs:         2.60 cm LV PW:         2.10 cm LV IVS:        1.50 cm LVOT diam:     1.80 cm LV SV:         32 LV SV Index:   22 LVOT Area:     2.54 cm  LV Volumes (MOD) LV vol d, MOD A2C: 47.7 ml LV vol d, MOD A4C: 49.3 ml LV vol s, MOD A2C: 33.3 ml LV vol s, MOD A4C: 27.3 ml LV SV MOD A2C:     14.4 ml LV SV MOD A4C:     49.3 ml LV SV MOD BP:  20.3 ml RIGHT VENTRICLE            IVC RV S prime:     6.64 cm/s  IVC diam: 1.30 cm TAPSE (M-mode): 1.6 cm LEFT ATRIUM             Index        RIGHT ATRIUM          Index LA diam:        3.20 cm 2.18 cm/m   RA Area:     7.77 cm LA Vol (A2C):   32.6 ml 22.25 ml/m  RA Volume:   12.60 ml 8.60 ml/m LA Vol (A4C):   49.5 ml 33.79 ml/m LA Biplane Vol: 40.7 ml 27.78 ml/m  AORTIC VALVE LVOT Vmax:   61.30 cm/s LVOT Vmean:  38.300 cm/s LVOT VTI:    0.124 m  AORTA Ao Root diam: 3.20 cm Ao Asc diam:  3.50 cm MITRAL VALVE                TRICUSPID VALVE MV Area (PHT): 4.31 cm     TR Peak grad:   27.5 mmHg  MV Area VTI:   1.01 cm     TR Vmax:        262.00 cm/s MV Peak grad:  5.7 mmHg MV Mean grad:  2.0 mmHg     SHUNTS MV Vmax:       1.19 m/s     Systemic VTI:  0.12 m MV Vmean:      62.5 cm/s    Systemic Diam: 1.80 cm MV Decel Time: 176 msec MV E velocity: 108.00 cm/s MV A velocity: 52.20 cm/s MV E/A ratio:  2.07 Donato Schultz MD Electronically signed by Donato Schultz MD Signature Date/Time: 08/21/2023/2:46:47 PM    Final    US THORACENTESIS ASP PLEURAL SPACE W/IMG GUIDE Result Date: 08/20/2023 INDICATION: 89 year old female. History of CHF, A fib. Presented to the ED with shortness of breath. Found to have a right-sided pleural effusion. Request is for therapeutic and diagnostic thoracentesis EXAM: ULTRASOUND GUIDED THERAPEUTIC AND DIAGNOSTIC RIGHT-SIDED THORACENTESIS MEDICATIONS: LIDOCAINE 1% 10 ML COMPLICATIONS: None immediate. PROCEDURE: An ultrasound guided thoracentesis was thoroughly discussed with the patient and questions answered. The benefits, risks, alternatives and complications were also discussed. The patient understands and wishes to proceed with the procedure. Written consent was obtained. Ultrasound was performed to localize and mark an adequate pocket of fluid in the right chest. The area was then prepped and draped in the normal sterile fashion. 1% Lidocaine was used for local anesthesia. Under ultrasound guidance a 6 Fr Safe-T-Centesis catheter was introduced. Thoracentesis was performed. The catheter was removed and a dressing applied. FINDINGS: A total of approximately 550 ML of cloudy yellow fluid was removed. Samples were sent to the laboratory as requested by the clinical team. IMPRESSION: Successful ultrasound guided therapeutic and diagnostic right-sided thoracentesis yielding 550 mL of pleural fluid. Performed by Anders Grant NP Electronically Signed   By: Gilmer Mor D.O.   On: 08/20/2023 14:23   DG Chest 1 View Result Date: 08/20/2023 CLINICAL DATA:  [id number] S/P thoracentesis [id number]  EXAM: CHEST  1 VIEW COMPARISON:  Chest x-ray 08/19/2023, ultrasound thoracentesis 08/20/2023 FINDINGS: The heart and mediastinal contours are unchanged. Atherosclerotic plaque. No focal consolidation. No pulmonary edema. At least small left pleural effusion. At least trace right pleural effusion. No pneumothorax. No acute osseous abnormality. IMPRESSION: 1.  At least small left pleural effusion. 2. At least trace right pleural effusion. Electronically Signed  By: Tish Frederickson M.D.   On: 08/20/2023 13:01       LOS: 2 days   Zannie Cove  [facility] Pager on [email or web address]  08/22/2023, 9:47 AM

## 2023-08-22 NOTE — TOC Progression Note (Signed)
 Transition of Care [facility]) - Progression Note    Patient Details  Name: Holly Nunez MRN: [id number] Date of Birth: [birth year]  [facility]) CM/SW Contact  Eduard Roux, [place] Phone Number: 08/22/2023, 2:35 PM  Clinical Narrative:     Per chart review patient is not fully alert. CSW called and spoke with patient's daughter,  Expected Discharge Plan: Skilled Nursing Facility Barriers to Discharge: Continued Medical Work up  Expected Discharge Plan and Services In-house Referral: Clinical Social Work                                             Social Determinants of Health (SDOH) Interventions SDOH Screenings   Food Insecurity: No Food Insecurity (08/21/2023)  Housing: Low Risk  (08/21/2023)  Transportation Needs: No Transportation Needs (08/21/2023)  Utilities: Not At Risk (08/21/2023)  Alcohol Screen: Low Risk  (01/29/2021)  Depression (PHQ2-9): Low Risk  (01/29/2021)  Financial Resource Strain: Low Risk  (01/29/2021)  Physical Activity: Inactive (01/29/2021)  Social Connections: Patient Unable To Answer (08/21/2023)  Stress: No Stress Concern Present (01/29/2021)  Tobacco Use: Low Risk  (08/19/2023)    Readmission Risk Interventions     No data to display

## 2023-08-22 NOTE — Progress Notes (Signed)
 Heart Failure Navigator Progress Note  Assessed for Heart & Vascular TOC clinic readiness.  Patient does not meet criteria due to [facility] appt on 4/18, aiming for conservative management only. Will not schedule HF TOC at this time.   Navigator available for reassessment of patient.   Sharen Hones, PharmD, BCPS Heart Failure Stewardship Pharmacist Phone [phone]

## 2023-08-22 NOTE — Progress Notes (Signed)
 Notified MD potassium 5.9 despite Lokelma x3 and tele order expired. Orders received for an additional dose of Lokelma and renewed tele orders.

## 2023-08-22 NOTE — Progress Notes (Signed)
 Patient daughter Marylouise Stacks informed the RN that patient wants to change her code status to [facility] notified

## 2023-08-22 NOTE — TOC Progression Note (Signed)
 Transition of Care [facility]) - Progression Note    Patient Details  Name: Holly Nunez MRN: [id number] Date of Birth: 1924/12/10  [facility] Care [facility]) CM/SW Contact  Eduard Roux, [place] Phone Number: 08/22/2023, 2:28 PM  Clinical Narrative:            Expected Discharge Plan and Services                                               Social Determinants of Health (SDOH) Interventions SDOH Screenings   Food Insecurity: No Food Insecurity (08/21/2023)  Housing: Low Risk  (08/21/2023)  Transportation Needs: No Transportation Needs (08/21/2023)  Utilities: Not At Risk (08/21/2023)  Alcohol Screen: Low Risk  (01/29/2021)  Depression (PHQ2-9): Low Risk  (01/29/2021)  Financial Resource Strain: Low Risk  (01/29/2021)  Physical Activity: Inactive (01/29/2021)  Social Connections: Patient Unable To Answer (08/21/2023)  Stress: No Stress Concern Present (01/29/2021)  Tobacco Use: Low Risk  (08/19/2023)    Readmission Risk Interventions     No data to display

## 2023-08-23 DIAGNOSIS — I509 Heart failure, unspecified: Secondary | ICD-10-CM | POA: Diagnosis not present

## 2023-08-23 DIAGNOSIS — J90 Pleural effusion, not elsewhere classified: Secondary | ICD-10-CM | POA: Diagnosis not present

## 2023-08-23 DIAGNOSIS — E876 Hypokalemia: Secondary | ICD-10-CM | POA: Diagnosis not present

## 2023-08-23 DIAGNOSIS — I4891 Unspecified atrial fibrillation: Secondary | ICD-10-CM | POA: Diagnosis not present

## 2023-08-23 LAB — BASIC METABOLIC PANEL WITH GFR
Anion gap: 8 (ref 5–15)
BUN: 8 mg/dL (ref 8–23)
CO2: 30 mmol/L (ref 22–32)
Calcium: 8.5 mg/dL — ABNORMAL LOW (ref 8.9–10.3)
Chloride: 97 mmol/L — ABNORMAL LOW (ref 98–111)
Creatinine, Ser: 0.71 mg/dL (ref 0.44–1.00)
GFR, Estimated: >60 mL/min (ref >60)
Glucose, Bld: 109 mg/dL — ABNORMAL HIGH (ref 70–99)
Potassium: 4.1 mmol/L (ref 3.5–5.1)
Sodium: 135 mmol/L (ref 135–145)

## 2023-08-23 LAB — CBC
HCT: 33.3 % — ABNORMAL LOW (ref 36.0–46.0)
Hemoglobin: 11.1 g/dL — ABNORMAL LOW (ref 12.0–15.0)
MCH: 31.4 pg (ref 26.0–34.0)
MCHC: 33.3 g/dL (ref 30.0–36.0)
MCV: 94.1 fL (ref 80.0–100.0)
Platelets: 184 10*3/uL (ref 150–400)
RBC: 3.54 MIL/uL — ABNORMAL LOW (ref 3.87–5.11)
RDW: 17.7 % — ABNORMAL HIGH (ref 11.5–15.5)
WBC: 4.8 10*3/uL (ref 4.0–10.5)
nRBC: 0 % (ref 0.0–0.2)

## 2023-08-23 LAB — BODY FLUID CULTURE W GRAM STAIN: Gram Stain: NONE SEEN

## 2023-08-23 LAB — CYTOLOGY - NON PAP

## 2023-08-23 MED ORDER — TORSEMIDE 20 MG PO TABS: 20.0000 mg | ORAL_TABLET | Freq: Every day | ORAL | Status: DC

## 2023-08-23 MED ORDER — MELATONIN 5 MG PO TABS: 5.0000 mg | ORAL_TABLET | Freq: Every evening | ORAL | Status: DC | PRN

## 2023-08-23 MED ORDER — ACETAMINOPHEN ER 650 MG PO TBCR
650.0000 mg | EXTENDED_RELEASE_TABLET | Freq: Three times a day (TID) | ORAL | Status: DC
Start: 2023-08-23 — End: 2023-11-18

## 2023-08-23 MED ORDER — METOPROLOL TARTRATE 25 MG PO TABS
25.0000 mg | ORAL_TABLET | Freq: Two times a day (BID) | ORAL | Status: DC
Start: 2023-08-23 — End: 2023-09-06

## 2023-08-23 NOTE — Discharge Summary (Signed)
 Physician Discharge Summary   Patient: Holly Nunez MRN: [id number] DOB: [birth year]  Admit date:     08/19/2023  Discharge date: 08/23/23  Discharge Physician: Thad Ranger, MD    PCP: Default, Provider, MD   Recommendations at discharge:   Started torsemide 20 mg daily, follow BMP in 3 days  Discharge Diagnoses:  Paroxysmal atrial fibrillation with rapid ventricular response (HCC) Acute on chronic combined CHF Bilateral pleural effusion, right > left  Hyperkalemia   Hypomagnesemia   Essential hypertension   CKD stage 3a, GFR 45-59 ml/min [facility])    Hospital Course:  89/F w chronic HFpEF, paroxysmal A-fib not on oral anticoagulation, previously on Xarelto which was stopped due to history of frequent falls, gait disorder, hyperlipidemia, who presented to the ER via EMS from SNF due to shortness of breath of a few days duration, worse for the past day.  Patient was noted to be in atrial fibrillation with RVR.  Found to have pleural effusions. -Admitted, started on diuretics, underwent thoracentesis 4/5 -4/6 and 4/7 noted to have hyperkalemia   Assessment and Plan:  Paroxysmal atrial fibrillation with RVR Previous history of same.  Not on anticoagulation due to frequent falls. -Started on Cardizem drip on admission, subsequently converted to sinus rhythm, with worsening cardiomyopathy, changed Cardizem to metoprolol.   -Supportive care, recommended conservative management only    Acute on chronic combined CHF Bilateral pleural effusions R>L -Underwent right-sided thoracentesis on 4/5 yielding 5 and 50 mL of cloudy yellow-colored fluid. -Unfortunately pleural fluid protein was not checked, pleural LDH was 68 -Suspect this is transudative from CHF, cultures are negative so far -2D echo noted EF of 40-45%, grade 2 DD, normal RV - Started on torsemide, continue metoprolol  - caution with GDMT in the setting of soft/low blood pressures, advanced age frailty, poor candidate for  SGLT2i - O2 weaned off, sats 98% on room air    Hyperkalemia -Initially with hypokalemia which was aggressively repleted, repeat potassium remains elevated, and decided to [facility] x 2  -Also starting torsemide which should help, K4.1 at discharge   Normocytic anemia Mild, now stable   Hypomagnesemia Improved.   Chronic kidney disease stage IIIa Stable.   Essential hypertension -Soft but stable, Cardizem changed to metoprolol       Pain control - [facility] Controlled Substance Reporting System database was reviewed. and patient was instructed, not to drive, operate heavy machinery, perform activities at heights, swimming or participation in water activities or provide baby-sitting services while on Pain, Sleep and Anxiety Medications; until their outpatient Physician has advised to do so again. Also recommended to not to take more than prescribed Pain, Sleep and Anxiety Medications.  Consultants:  Procedures performed: Thoracentesis Disposition: Skilled nursing facility Diet recommendation: Dysphagia 3 diet with thin liquids  DISCHARGE MEDICATION: Allergies as of 08/23/2023       Reactions   Alphagan [brimonidine] Other (See Comments)   Secondary infection and vision changes. Allergy not listed on MAR         Medication List     STOP taking these medications    sodium bicarbonate 650 MG tablet       TAKE these medications    acetaminophen 650 MG CR tablet Commonly known as: TYLENOL Take 1 tablet (650 mg total) by mouth in the morning, at noon, and at bedtime. May also give 2 tablets every 6 hours as needed for pain   ASPERCREME EX Apply 1 application  topically in the morning and at bedtime. Apply  to both legs   aspirin EC 81 MG tablet Take 81 mg by mouth in the morning.   atorvastatin 20 MG tablet Commonly known as: LIPITOR Take 20 mg by mouth in the morning.   Biofreeze Cool The Pain 4 % Gel Generic drug: Menthol (Topical Analgesic) Apply 1  Application topically 3 (three) times daily. Apply topically to neck   Combigan 0.2-0.5 % ophthalmic solution Generic drug: brimonidine-timolol Place 1 drop into both eyes in the morning and at bedtime.   Dextran 70-Hypromellose 0.1-0.3 % Soln Apply 2 drops to eye every 12 (twelve) hours as needed (dry, itchy, irritated eyes).   dextromethorphan-guaiFENesin 10-100 MG/5ML liquid Commonly known as: ROBITUSSIN-DM Take 10 mLs by mouth every 6 (six) hours as needed for cough.   folic acid 1 MG tablet Commonly known as: FOLVITE Take 1 mg by mouth in the morning.   latanoprost 0.005 % ophthalmic solution Commonly known as: XALATAN Place 1 drop into both eyes at bedtime.   melatonin 5 MG Tabs Take 1 tablet (5 mg total) by mouth at bedtime as needed.   metoprolol tartrate 25 MG tablet Commonly known as: LOPRESSOR Take 1 tablet (25 mg total) by mouth 2 (two) times daily.   mirtazapine 7.5 MG tablet Commonly known as: REMERON Take 7.5 mg by mouth at bedtime.   multivitamin with minerals Tabs tablet Take 1 tablet by mouth in the morning.   nitroGLYCERIN 0.4 MG SL tablet Commonly known as: NITROSTAT Place 1 tablet (0.4 mg total) under the tongue every 5 (five) minutes as needed for chest pain.   NON FORMULARY Take 120 mLs by mouth in the morning and at bedtime. Med pass   Omega 3 1000 MG Caps Take 1,000 mg by mouth daily.   ondansetron 4 MG tablet Commonly known as: ZOFRAN Take 4 mg by mouth every 8 (eight) hours as needed for nausea or vomiting.   polyethylene glycol 17 g packet Commonly known as: MIRALAX / GLYCOLAX Take 17 g by mouth in the morning.   PROBIOTIC PO Take 1 capsule by mouth daily.   senna-docusate 8.6-50 MG tablet Commonly known as: Senokot-S Take 1 tablet by mouth 2 (two) times daily.   torsemide 20 MG tablet Commonly known as: DEMADEX Take 1 tablet (20 mg total) by mouth daily. Start taking on: August 24, 2023   UNABLE TO FIND Take 120 mLs by mouth  in the morning, at noon, and at bedtime. Med Name: Mighty Shake        Contact information for after-discharge care     Destination     [facility] Preferred SNF .   Service: Skilled Nursing Contact information: [address] [place] [address] [phone]                    Discharge Exam: Ceasar Mons Weights   08/21/23 0500 08/22/23 0500 08/23/23 0708  Weight: 51.3 kg 52.8 kg 52.4 kg   S: No acute complaints, daughter at the bedside.  Feels ready to discharge to SNF.  BP (!) 107/58   Pulse 100   Temp 97.7 F (36.5 C) (Oral)   Resp 20   Ht 5' (1.524 m)   Wt 52.4 kg   SpO2 98%   BMI 22.56 kg/m   Physical Exam General: Alert and oriented x 3, NAD Cardiovascular: Irregular Respiratory: CTAB, no wheezing Gastrointestinal: Soft, nontender, nondistended, NBS Ext: no pedal edema bilaterally Neuro: no new deficits Psych: Normal affect    Condition  at discharge: fair  The results of significant diagnostics from this hospitalization (including imaging, microbiology, ancillary and laboratory) are listed below for reference.   Imaging Studies: ECHOCARDIOGRAM COMPLETE Result Date: 08/21/2023    ECHOCARDIOGRAM REPORT   Patient Name:   JABREE PERNICE Date of Exam: 08/21/2023 Medical Rec #:  [id number]      Height:       60.0 in Accession #:    [id number]     Weight:       113.1 lb Date of Birth:  07-Nov-1924     BSA:          1.465 m Patient Age:    89 years       BP:           105/59 mmHg Patient Gender: F              HR:           65 bpm. Exam Location:  Inpatient Procedure: 2D Echo, Cardiac Doppler and Color Doppler (Both Spectral and Color            Flow Doppler were utilized during procedure). Indications:    I50.40* Unspecified combined systolic (congestive) and diastolic                 (congestive) heart failure  History:        Patient has prior history of Echocardiogram examinations, most                 recent 05/15/2024. Abnormal ECG,  TIA, Arrythmias:Atrial                 Fibrillation, Signs/Symptoms:Altered Mental Status, Dyspnea and                 Shortness of Breath; Risk Factors:Hypertension and Dyslipidemia.  Sonographer:    Sheralyn Boatman RDCS Referring Phys: [id number] CAROLE N HALL IMPRESSIONS  1. Left ventricular ejection fraction, by estimation, is 40 to 45%. The left ventricle has mildly decreased function. The left ventricle demonstrates global hypokinesis. There is severe left ventricular hypertrophy. Left ventricular diastolic parameters  are consistent with Grade II diastolic dysfunction (pseudonormalization).  2. Right ventricular systolic function is normal. The right ventricular size is normal.  3. Left atrial size was severely dilated.  4. Large pleural effusion in the left lateral region.  5. The mitral valve is normal in structure. Moderate mitral valve regurgitation. No evidence of mitral stenosis. Moderate mitral annular calcification.  6. The aortic valve is normal in structure. There is mild calcification of the aortic valve. There is mild thickening of the aortic valve. Aortic valve regurgitation is not visualized. Aortic valve sclerosis is present, with no evidence of aortic valve stenosis.  7. The inferior vena cava is normal in size with greater than 50% respiratory variability, suggesting right atrial pressure of 3 mmHg.  8. Evidence of atrial level shunting detected by color flow Doppler. FINDINGS  Left Ventricle: Left ventricular ejection fraction, by estimation, is 40 to 45%. The left ventricle has mildly decreased function. The left ventricle demonstrates global hypokinesis. The left ventricular internal cavity size was normal in size. There is  severe left ventricular hypertrophy. Left ventricular diastolic parameters are consistent with Grade II diastolic dysfunction (pseudonormalization). Right Ventricle: The right ventricular size is normal. No increase in right ventricular wall thickness. Right ventricular  systolic function is normal. Left Atrium: Left atrial size was severely dilated. Right Atrium: Right atrial size was normal in size. Pericardium: There is  no evidence of pericardial effusion. Mitral Valve: The mitral valve is normal in structure. Moderate mitral annular calcification. Moderate mitral valve regurgitation. No evidence of mitral valve stenosis. MV peak gradient, 5.7 mmHg. The mean mitral valve gradient is 2.0 mmHg. Tricuspid Valve: The tricuspid valve is normal in structure. Tricuspid valve regurgitation is mild . No evidence of tricuspid stenosis. Aortic Valve: The aortic valve is normal in structure. There is mild calcification of the aortic valve. There is mild thickening of the aortic valve. Aortic valve regurgitation is not visualized. Aortic valve sclerosis is present, with no evidence of aortic valve stenosis. Pulmonic Valve: The pulmonic valve was normal in structure. Pulmonic valve regurgitation is not visualized. No evidence of pulmonic stenosis. Aorta: The aortic root is normal in size and structure. Venous: The inferior vena cava is normal in size with greater than 50% respiratory variability, suggesting right atrial pressure of 3 mmHg. IAS/Shunts: Evidence of atrial level shunting detected by color flow Doppler. Additional Comments: There is a large pleural effusion in the left lateral region.  LEFT VENTRICLE PLAX 2D LVIDd:         3.00 cm LVIDs:         2.60 cm LV PW:         2.10 cm LV IVS:        1.50 cm LVOT diam:     1.80 cm LV SV:         32 LV SV Index:   22 LVOT Area:     2.54 cm  LV Volumes (MOD) LV vol d, MOD A2C: 47.7 ml LV vol d, MOD A4C: 49.3 ml LV vol s, MOD A2C: 33.3 ml LV vol s, MOD A4C: 27.3 ml LV SV MOD A2C:     14.4 ml LV SV MOD A4C:     49.3 ml LV SV MOD BP:      20.3 ml RIGHT VENTRICLE            IVC RV S prime:     6.64 cm/s  IVC diam: 1.30 cm TAPSE (M-mode): 1.6 cm LEFT ATRIUM             Index        RIGHT ATRIUM          Index LA diam:        3.20 cm 2.18 cm/m    RA Area:     7.77 cm LA Vol (A2C):   32.6 ml 22.25 ml/m  RA Volume:   12.60 ml 8.60 ml/m LA Vol (A4C):   49.5 ml 33.79 ml/m LA Biplane Vol: 40.7 ml 27.78 ml/m  AORTIC VALVE LVOT Vmax:   61.30 cm/s LVOT Vmean:  38.300 cm/s LVOT VTI:    0.124 m  AORTA Ao Root diam: 3.20 cm Ao Asc diam:  3.50 cm MITRAL VALVE                TRICUSPID VALVE MV Area (PHT): 4.31 cm     TR Peak grad:   27.5 mmHg MV Area VTI:   1.01 cm     TR Vmax:        262.00 cm/s MV Peak grad:  5.7 mmHg MV Mean grad:  2.0 mmHg     SHUNTS MV Vmax:       1.19 m/s     Systemic VTI:  0.12 m MV Vmean:      62.5 cm/s    Systemic Diam: 1.80 cm MV Decel Time: 176 msec MV E  velocity: 108.00 cm/s MV A velocity: 52.20 cm/s MV E/A ratio:  2.07 Donato Schultz MD Electronically signed by Donato Schultz MD Signature Date/Time: 08/21/2023/2:46:47 PM    Final    US THORACENTESIS ASP PLEURAL SPACE W/IMG GUIDE Result Date: 08/20/2023 INDICATION: 89 year old female. History of CHF, A fib. Presented to the ED with shortness of breath. Found to have a right-sided pleural effusion. Request is for therapeutic and diagnostic thoracentesis EXAM: ULTRASOUND GUIDED THERAPEUTIC AND DIAGNOSTIC RIGHT-SIDED THORACENTESIS MEDICATIONS: LIDOCAINE 1% 10 ML COMPLICATIONS: None immediate. PROCEDURE: An ultrasound guided thoracentesis was thoroughly discussed with the patient and questions answered. The benefits, risks, alternatives and complications were also discussed. The patient understands and wishes to proceed with the procedure. Written consent was obtained. Ultrasound was performed to localize and mark an adequate pocket of fluid in the right chest. The area was then prepped and draped in the normal sterile fashion. 1% Lidocaine was used for local anesthesia. Under ultrasound guidance a 6 Fr Safe-T-Centesis catheter was introduced. Thoracentesis was performed. The catheter was removed and a dressing applied. FINDINGS: A total of approximately 550 ML of cloudy yellow fluid was  removed. Samples were sent to the laboratory as requested by the clinical team. IMPRESSION: Successful ultrasound guided therapeutic and diagnostic right-sided thoracentesis yielding 550 mL of pleural fluid. Performed by Anders Grant NP Electronically Signed   By: Gilmer Mor D.O.   On: 08/20/2023 14:23   DG Chest 1 View Result Date: 08/20/2023 CLINICAL DATA:  [id number] S/P thoracentesis [id number] EXAM: CHEST  1 VIEW COMPARISON:  Chest x-ray 08/19/2023, ultrasound thoracentesis 08/20/2023 FINDINGS: The heart and mediastinal contours are unchanged. Atherosclerotic plaque. No focal consolidation. No pulmonary edema. At least small left pleural effusion. At least trace right pleural effusion. No pneumothorax. No acute osseous abnormality. IMPRESSION: 1.  At least small left pleural effusion. 2. At least trace right pleural effusion. Electronically Signed   By: Tish Frederickson M.D.   On: 08/20/2023 13:01   DG Chest Port 1 View Result Date: 08/20/2023 CLINICAL DATA:  Shortness of breath EXAM: PORTABLE CHEST 1 VIEW COMPARISON:  05/14/2021 FINDINGS: Mild cardiomegaly with small left and small moderate right pleural effusion. Bibasilar airspace disease. Aortic atherosclerosis. No pneumothorax IMPRESSION: Mild cardiomegaly with small left and small to moderate right pleural effusions. Bibasilar airspace disease, atelectasis versus pneumonia. Electronically Signed   By: Jasmine Pang M.D.   On: 08/20/2023 00:07    Microbiology: Results for orders placed or performed during the hospital encounter of 08/19/23  Resp panel by RT-PCR (RSV, Flu A&B, Covid) Anterior Nasal Swab     Status: None   Collection Time: 08/19/23 11:42 PM   Specimen: Anterior Nasal Swab  Result Value Ref Range Status   SARS Coronavirus 2 by RT PCR NEGATIVE NEGATIVE Final   Influenza A by PCR NEGATIVE NEGATIVE Final   Influenza B by PCR NEGATIVE NEGATIVE Final    Comment: (NOTE) The Xpert Xpress SARS-CoV-2/FLU/RSV plus assay is intended as  an aid in the diagnosis of influenza from Nasopharyngeal swab specimens and should not be used as a sole basis for treatment. Nasal washings and aspirates are unacceptable for Xpert Xpress SARS-CoV-2/FLU/RSV testing.  Fact Sheet for Patients: BloggerCourse.com  Fact Sheet for Healthcare Providers: SeriousBroker.it  This test is not yet approved or cleared by the [place] FDA and has been authorized for detection and/or diagnosis of SARS-CoV-2 by FDA under an Emergency Use Authorization (EUA). This EUA will remain in effect (meaning this test can be used) for the  duration of the COVID-19 declaration under Section 564(b)(1) of the Act, 21 U.S.C. section 360bbb-3(b)(1), unless the authorization is terminated or revoked.     Resp Syncytial Virus by PCR NEGATIVE NEGATIVE Final    Comment: (NOTE) Fact Sheet for Patients: BloggerCourse.com  Fact Sheet for Healthcare Providers: SeriousBroker.it  This test is not yet approved or cleared by the [place] FDA and has been authorized for detection and/or diagnosis of SARS-CoV-2 by FDA under an Emergency Use Authorization (EUA). This EUA will remain in effect (meaning this test can be used) for the duration of the COVID-19 declaration under Section 564(b)(1) of the Act, 21 U.S.C. section 360bbb-3(b)(1), unless the authorization is terminated or revoked.  Performed at [facility], [address] [place], [place] [address]   Body fluid culture w Gram Stain     Status: None (Preliminary result)   Collection Time: 08/20/23 12:38 PM   Specimen: Lung, Right; Pleural Fluid  Result Value Ref Range Status   Specimen Description PLEURAL  Final   Special Requests right lung  Final   Gram Stain NO WBC SEEN NO ORGANISMS SEEN   Final   Culture   Final    NO GROWTH 3 DAYS Performed at [facility], [address]  [place], [place] [address]    Report Status PENDING  Incomplete    Labs: CBC: Recent Labs  Lab 08/20/23 0056 08/20/23 0546 08/22/23 0205 08/23/23 0332  WBC 6.6 6.8 5.6 4.8  NEUTROABS 5.0  --   --   --   HGB 11.7* 10.6* 10.2* 11.1*  HCT 35.8* 32.6* 32.0* 33.3*  MCV 94.0 96.4 97.9 94.1  PLT 170 168 138* 184   Basic Metabolic Panel: Recent Labs  Lab 08/20/23 0056 08/20/23 0546 08/21/23 0320 08/21/23 1055 08/21/23 1809 08/22/23 0205 08/22/23 1541 08/23/23 0332  NA 141 140 140  --   --  137  --  135  K 3.1* 3.4* 5.8* 6.1* 5.6* 5.9* 4.5 4.1  CL 97* 99 100  --   --  102  --  97*  CO2 34* 32 33*  --   --  27  --  30  GLUCOSE 116* 106* 114*  --   --  113*  --  109*  BUN 11 11 11   --   --  12  --  8  CREATININE 0.86 0.80 0.70  --   --  0.80  --  0.71  CALCIUM 8.7* 8.3* 8.5*  --   --  8.1*  --  8.5*  MG  --  1.5* 1.8  --   --   --   --   --   PHOS  --  2.6  --   --   --   --   --   --    Liver Function Tests: Recent Labs  Lab 08/20/23 0056 08/21/23 0320  AST 30 33  ALT 15 16  ALKPHOS 52 57  BILITOT 0.8 0.9  PROT 5.0* 5.2*  ALBUMIN 2.1* 2.1*   CBG: No results for input(s): "GLUCAP" in the last 168 hours.  Discharge time spent: greater than 30 minutes.  Signed: Thad Ranger, MD [facility] 08/23/2023

## 2023-08-23 NOTE — Plan of Care (Signed)

## 2023-08-23 NOTE — Progress Notes (Signed)
 Report given to [facility],all questions were answered

## 2023-08-23 NOTE — TOC Transition Note (Signed)
 Transition of Care [facility]) - Discharge Note   Patient Details  Name: Holly Nunez MRN: [id number] Date of Birth: 09-Jul-1924  [facility] CM/SW Contact:  Eduard Roux, LCSW Phone Number: 08/23/2023, 11:37 AM   Clinical Narrative:     Patient will Discharge to: [facility] Discharge Date: 08/23/23 Family Notified: [name],daughter Transport NW:[facility]  Per MD patient is ready for discharge. RN, patient, and facility notified of discharge. Discharge Summary sent to facility. RN given number for [phone]. Ambulance transport requested for patient.   Clinical Social Worker signing off.  Antony Blackbird, MSW, LCSW Clinical Social Worker     Final next level of care: Skilled Nursing Facility Barriers to Discharge: Barriers Resolved   Patient Goals and CMS Choice            Discharge Placement              Patient chooses bed at: [facility] Patient to be transferred to facility by: [facility] Name of family member notified: daughter, Marylouise Stacks Patient and family notified of of transfer: 08/23/23  Discharge Plan and Services Additional resources added to the After Visit Summary for   In-house Referral: Clinical Social Work                                   Social Drivers of Health (SDOH) Interventions SDOH Screenings   Food Insecurity: No Food Insecurity (08/21/2023)  Housing: Low Risk  (08/21/2023)  Transportation Needs: No Transportation Needs (08/21/2023)  Utilities: Not At Risk (08/21/2023)  Alcohol Screen: Low Risk  (01/29/2021)  Depression (PHQ2-9): Low Risk  (01/29/2021)  Financial Resource Strain: Low Risk  (01/29/2021)  Physical Activity: Inactive (01/29/2021)  Social Connections: Patient Unable To Answer (08/21/2023)  Stress: No Stress Concern Present (01/29/2021)  Tobacco Use: Low Risk  (08/19/2023)     Readmission Risk Interventions     No data to display

## 2023-09-01 NOTE — Progress Notes (Signed)
 Cardiology Office Note    Date:  09/02/2023  ID:  Valarie Farace, DOB 11-May-1925, MRN [id number] PCP:  Default, Provider, MD  Cardiologist:  Alexandria Angel, MD  Electrophysiologist:  None   Chief Complaint: Hospital follow up for atrial fibrillation   History of Present Illness: .    Sharyn Brilliant is a 89 y.o. female with visit-pertinent history of paroxysmal atrial fibrillation, heart failure with mildly reduced EF, TIA, hypertension, hyperlipidemia, and patent foramen ovale.  Echocardiogram in November 2014 showed normal LV function, mild to moderate LVH, grade 2 DD, aneurysmal atrial septum with PFO.  Patient with a known history of TIAs.  She was previously on Plavix and aspirin  however she had recurrence and is now on a low-dose Xarelto .  In 2016 she had coronary calcification noted on the CT scan.  She underwent nuclear stress test in 02/2015 with normal perfusion.  She was last seen by Dr. Audery Blazing in 2022 and was doing well at that time.  On 04/17/2023 she was brought to the ER after she was found in her wheelchair to be difficult to arouse.  She was treated for a UTI.  On 06/09/2023 patient presented to the office for preoperative cardiac evaluation in order to undergo cystoscopy with lithotripsy and stent placement.  It was noted that as patient was to undergo cystoscopy with lithotripsy and stent placement given kidney stones, frequent UTIs and ED evaluations and that she been optimized from a cardiac standpoint she could proceed with procedure without any further cardiovascular testing.   On chart review in 06/2023 patient was admitted to [facility] after being found with acute renal failure prior to planned lithotripsy and right ureteral stent exchange on 07/12/2023.  On presentation for day of surgery her creatinine was 5.9 and potassium 4.5 patient was directly admitted.  Patient underwent lithotripsy with right ureteral stent removal on 08/14/2023.  CT abdomen pelvis noted  right ureteral stent, interval dilution of right hydroureteronephrosis, nonobstructing bilateral 1-3 either nephrolithiasis.  On day of discharge patient's creatinine was 2.22.  Patient was discharged on 07/20/2023.  On 08/19/23 patient presented to the emergency department with increased shortness of breath, requiring oxygen more often than was normal.  On arrival to the ED she was noted to be in atrial fibrillation with RVR at a rate of 130 bpm.  Chest x-ray showed cardiomegaly with small left and moderate right pleural effusions, BNP was elevated at 769.  Patient was started on IV Cardizem  with improvement in heart rates.  Patient previously not on anticoagulation given falls risk and history of psoas hematoma while on systemic anticoagulation, through shared decision making with patient's daughter and hospitalist she was not restarted on anticoagulation.  Patient underwent right sided thoracentesis on 4/5 yielding 50 mL of cloudy yellow-colored fluid, appeared transudative process.  While on Cardizem  she converted to sinus rhythm.  Echocardiogram on 08/21/2023 indicated LVEF of 40 to 45%, global hypokinesis, severe LVH, grade 2 diastolic dysfunction, RV systolic function was normal, left atrium was severely dilated, moderate mitral valve regurgitation, no evidence of mitral stenosis, moderate mitral annular calcification, mild calcification of the aortic valve, mild thickening of the aortic valve.  Given reduction in LVEF Cardizem  was discontinued and she was started on metoprolol .  Patient was discharged on 08/23/23.  Today she presents for follow-up with her daughter.  Patient denies any chest pain.  She does report some shortness of breath, her daughter is concerned that she appears to have worsened breathing compared to her  baseline. Patient endorses some difficulty breathing laying down. They deny any recent lower extremity edema, note significantly improved from prior to recent admission.  Patient's EKG today  indicates atrial fibrillation with RVR.  ROS: .   Today she denies chest pain, lower extremity edema, palpitations, melena, hematuria, hemoptysis, diaphoresis, presyncope, syncope, and PND.  All other systems are reviewed and otherwise negative. Studies Reviewed: Aaron Aas   EKG:  EKG is ordered today, personally reviewed, demonstrating  EKG Interpretation Date/Time:  Friday September 02 2023 10:08:26 EDT Ventricular Rate:  151 PR Interval:    QRS Duration:  72 QT Interval:  256 QTC Calculation: 405 R Axis:   123  Text Interpretation: Atrial fibrillation with rapid ventricular response Right axis deviation ST & T wave abnormality, consider lateral ischemia Confirmed by Moon Budde [phone]) on 09/02/2023 11:12:43 AM   CV Studies: Cardiac studies reviewed are outlined and summarized above. Otherwise please see EMR for full report. Cardiac Studies & Procedures   ______________________________________________________________________________________________   STRESS TESTS  MYOCARDIAL PERFUSION IMAGING 02/27/2015  Narrative  The left ventricular ejection fraction is hyperdynamic (>65%).  Nuclear stress EF: 75%.  There was no ST segment deviation noted during stress.  The study is normal.  This is a low risk study.  Normal lexiscan  nuclear stress test demonstrating normal myocardial perfusion and function: EF 75%.   ECHOCARDIOGRAM  ECHOCARDIOGRAM COMPLETE 08/21/2023  Narrative ECHOCARDIOGRAM REPORT    Patient Name:   LILIAHNA CUDD Date of Exam: 08/21/2023 Medical Rec #:  [id number]      Height:       60.0 in Accession #:    [id number]     Weight:       113.1 lb Date of Birth:  07-28-1924     BSA:          1.465 m Patient Age:    89 years       BP:           105/59 mmHg Patient Gender: F              HR:           65 bpm. Exam Location:  Inpatient  Procedure: 2D Echo, Cardiac Doppler and Color Doppler (Both Spectral and Color Flow Doppler were utilized during  procedure).  Indications:    I50.40* Unspecified combined systolic (congestive) and diastolic (congestive) heart failure  History:        Patient has prior history of Echocardiogram examinations, most recent 05/15/2024. Abnormal ECG, TIA, Arrythmias:Atrial Fibrillation, Signs/Symptoms:Altered Mental Status, Dyspnea and Shortness of Breath; Risk Factors:Hypertension and Dyslipidemia.  Sonographer:    Raynelle Callow RDCS Referring Phys: [id number] CAROLE N HALL  IMPRESSIONS   1. Left ventricular ejection fraction, by estimation, is 40 to 45%. The left ventricle has mildly decreased function. The left ventricle demonstrates global hypokinesis. There is severe left ventricular hypertrophy. Left ventricular diastolic parameters are consistent with Grade II diastolic dysfunction (pseudonormalization). 2. Right ventricular systolic function is normal. The right ventricular size is normal. 3. Left atrial size was severely dilated. 4. Large pleural effusion in the left lateral region. 5. The mitral valve is normal in structure. Moderate mitral valve regurgitation. No evidence of mitral stenosis. Moderate mitral annular calcification. 6. The aortic valve is normal in structure. There is mild calcification of the aortic valve. There is mild thickening of the aortic valve. Aortic valve regurgitation is not visualized. Aortic valve sclerosis is present, with no evidence of aortic valve stenosis. 7. The inferior vena cava  is normal in size with greater than 50% respiratory variability, suggesting right atrial pressure of 3 mmHg. 8. Evidence of atrial level shunting detected by color flow Doppler.  FINDINGS Left Ventricle: Left ventricular ejection fraction, by estimation, is 40 to 45%. The left ventricle has mildly decreased function. The left ventricle demonstrates global hypokinesis. The left ventricular internal cavity size was normal in size. There is severe left ventricular hypertrophy. Left ventricular  diastolic parameters are consistent with Grade II diastolic dysfunction (pseudonormalization).  Right Ventricle: The right ventricular size is normal. No increase in right ventricular wall thickness. Right ventricular systolic function is normal.  Left Atrium: Left atrial size was severely dilated.  Right Atrium: Right atrial size was normal in size.  Pericardium: There is no evidence of pericardial effusion.  Mitral Valve: The mitral valve is normal in structure. Moderate mitral annular calcification. Moderate mitral valve regurgitation. No evidence of mitral valve stenosis. MV peak gradient, 5.7 mmHg. The mean mitral valve gradient is 2.0 mmHg.  Tricuspid Valve: The tricuspid valve is normal in structure. Tricuspid valve regurgitation is mild . No evidence of tricuspid stenosis.  Aortic Valve: The aortic valve is normal in structure. There is mild calcification of the aortic valve. There is mild thickening of the aortic valve. Aortic valve regurgitation is not visualized. Aortic valve sclerosis is present, with no evidence of aortic valve stenosis.  Pulmonic Valve: The pulmonic valve was normal in structure. Pulmonic valve regurgitation is not visualized. No evidence of pulmonic stenosis.  Aorta: The aortic root is normal in size and structure.  Venous: The inferior vena cava is normal in size with greater than 50% respiratory variability, suggesting right atrial pressure of 3 mmHg.  IAS/Shunts: Evidence of atrial level shunting detected by color flow Doppler.  Additional Comments: There is a large pleural effusion in the left lateral region.   LEFT VENTRICLE PLAX 2D LVIDd:         3.00 cm LVIDs:         2.60 cm LV PW:         2.10 cm LV IVS:        1.50 cm LVOT diam:     1.80 cm LV SV:         32 LV SV Index:   22 LVOT Area:     2.54 cm  LV Volumes (MOD) LV vol d, MOD A2C: 47.7 ml LV vol d, MOD A4C: 49.3 ml LV vol s, MOD A2C: 33.3 ml LV vol s, MOD A4C: 27.3 ml LV SV  MOD A2C:     14.4 ml LV SV MOD A4C:     49.3 ml LV SV MOD BP:      20.3 ml  RIGHT VENTRICLE            IVC RV S prime:     6.64 cm/s  IVC diam: 1.30 cm TAPSE (M-mode): 1.6 cm  LEFT ATRIUM             Index        RIGHT ATRIUM          Index LA diam:        3.20 cm 2.18 cm/m   RA Area:     7.77 cm LA Vol (A2C):   32.6 ml 22.25 ml/m  RA Volume:   12.60 ml 8.60 ml/m LA Vol (A4C):   49.5 ml 33.79 ml/m LA Biplane Vol: 40.7 ml 27.78 ml/m AORTIC VALVE LVOT Vmax:   61.30 cm/s LVOT Vmean:  38.300 cm/s LVOT VTI:    0.124 m  AORTA Ao Root diam: 3.20 cm Ao Asc diam:  3.50 cm  MITRAL VALVE                TRICUSPID VALVE MV Area (PHT): 4.31 cm     TR Peak grad:   27.5 mmHg MV Area VTI:   1.01 cm     TR Vmax:        262.00 cm/s MV Peak grad:  5.7 mmHg MV Mean grad:  2.0 mmHg     SHUNTS MV Vmax:       1.19 m/s     Systemic VTI:  0.12 m MV Vmean:      62.5 cm/s    Systemic Diam: 1.80 cm MV Decel Time: 176 msec MV E velocity: 108.00 cm/s MV A velocity: 52.20 cm/s MV E/A ratio:  2.07  Dorothye Gathers MD Electronically signed by Dorothye Gathers MD Signature Date/Time: 08/21/2023/2:46:47 PM    Final          ______________________________________________________________________________________________      Current Reported Medications:.    Current Meds  Medication Sig   acetaminophen  (TYLENOL ) 650 MG CR tablet Take 1 tablet (650 mg total) by mouth in the morning, at noon, and at bedtime. May also give 2 tablets every 6 hours as needed for pain   aspirin  EC 81 MG tablet Take 81 mg by mouth in the morning.   atorvastatin  (LIPITOR) 20 MG tablet Take 20 mg by mouth in the morning.   COMBIGAN  0.2-0.5 % ophthalmic solution Place 1 drop into both eyes in the morning and at bedtime.   Dextran 70-Hypromellose 0.1-0.3 % SOLN Apply 2 drops to eye every 12 (twelve) hours as needed (dry, itchy, irritated eyes).   dextromethorphan-guaiFENesin  (ROBITUSSIN-DM) 10-100 MG/5ML liquid Take 10 mLs by  mouth every 6 (six) hours as needed for cough.   folic acid  (FOLVITE ) 1 MG tablet Take 1 mg by mouth in the morning.   latanoprost  (XALATAN ) 0.005 % ophthalmic solution Place 1 drop into both eyes at bedtime.   melatonin 5 MG TABS Take 1 tablet (5 mg total) by mouth at bedtime as needed.   Menthol , Topical Analgesic, (BIOFREEZE COOL THE PAIN) 4 % GEL Apply 1 Application topically 3 (three) times daily. Apply topically to neck   metoprolol  tartrate (LOPRESSOR ) 25 MG tablet Take 1 tablet (25 mg total) by mouth 2 (two) times daily.   mirtazapine (REMERON) 7.5 MG tablet Take 7.5 mg by mouth at bedtime.   Multiple Vitamin (MULTIVITAMIN WITH MINERALS) TABS tablet Take 1 tablet by mouth in the morning.   nitroGLYCERIN  (NITROSTAT ) 0.4 MG SL tablet Place 1 tablet (0.4 mg total) under the tongue every 5 (five) minutes as needed for chest pain.   NON FORMULARY Take 120 mLs by mouth in the morning and at bedtime. Med pass   Omega 3 1000 MG CAPS Take 1,000 mg by mouth daily.   ondansetron  (ZOFRAN ) 4 MG tablet Take 4 mg by mouth every 8 (eight) hours as needed for nausea or vomiting.   polyethylene glycol (MIRALAX  / GLYCOLAX ) 17 g packet Take 17 g by mouth in the morning.   Probiotic Product (PROBIOTIC PO) Take 1 capsule by mouth daily.   senna-docusate (SENOKOT-S) 8.6-50 MG tablet Take 1 tablet by mouth 2 (two) times daily.   torsemide  (DEMADEX ) 20 MG tablet Take 1 tablet (20 mg total) by mouth daily.   Trolamine Salicylate (ASPERCREME EX) Apply 1 application  topically  in the morning and at bedtime. Apply to both legs   UNABLE TO FIND Take 120 mLs by mouth in the morning, at noon, and at bedtime. Med Name: Karrie Paddy   Physical Exam:    VS:  BP 90/70   Pulse (!) 151   Ht 5' (1.524 m)   Wt 113 lb (51.3 kg)   SpO2 95%   BMI 22.07 kg/m    Wt Readings from Last 3 Encounters:  09/02/23 113 lb (51.3 kg)  08/23/23 115 lb 8.3 oz (52.4 kg)  07/20/23 112 lb 10.5 oz (51.1 kg)    GEN: Frail, in no acute  distress NECK: No JVD; No carotid bruits CARDIAC: Irregular RR, no murmurs, rubs, gallops RESPIRATORY:  Diminished breath sounds in bases without wheezing or rhonchi  ABDOMEN: Soft, non-tender, non-distended EXTREMITIES:  No edema; No acute deformity     Asessement and Plan:.    Paroxysmal atrial fibrillation: Patient with history of paroxysmal atrial fibrillation, no longer on anticoagulation given age and history of falls, she is now wheelchair/bedbound.  Patient was previously on Cardizem , discontinued during recent hospitalization given reduction in LVEF to 40 to 45%, started on metoprolol  tartrate 25 mg twice daily.  EKG today indicates atrial fibrillation with RVR.  Patient denies any palpitations or chest pain.  Blood pressure is overall soft.  Discussed with Dr. Deanna ExposeAndree Kayser, DOD at [facility] office today, recommended emergency room evaluation and consideration of digoxin . Discussed with both of patient's daughters, both in agreement that patient needs to be evaluated in the emergency department. Patient transported to [facility] by EMS, on EMS arrival EKG indicated atrial fibrillation with heart rates in the 140s to 150s.  HFrEF/Severe LVH: Echocardiogram on 08/21/2023 indicated LVEF of 40 to 45%, global hypokinesis, severe LVH, grade 2 diastolic dysfunction, RV systolic function was normal, left atrium was severely dilated, moderate mitral valve regurgitation, no evidence of mitral stenosis, moderate mitral annular calcification, mild calcification of the aortic valve, mild thickening of the aortic valve.  Patient not started on GDMT during hospitalization given soft blood pressures, she is not a SGLT2 inhibitor candidate given frequent UTIs.  Hypertension: Initial blood pressure today 108/68, on arrival of EMS was 90/70.   Coronary calcification: Previously noted on CT scan.  She underwent nuclear stress test in 02/2015 with normal perfusion.  Patient with no history of MI, PCI or sternotomy.   Patient denies any chest pain.  Pleural effusions: Patient underwent right thoracentesis during recent admission on 4/5.  Chest x-ray following indicated a small left pleural effusion, at least trace right pleural effusion.  Echo on 4/6 indicated a large pleural effusion in the left lateral region.  Breath sounds diminished bilaterally, patient to present to the emergency department for further evaluation.   Disposition: Patient to present to the emergency department for evaluation   Signed, Knox Cervi D Avice Funchess, NP

## 2023-09-02 ENCOUNTER — Emergency Department (HOSPITAL_COMMUNITY)

## 2023-09-02 ENCOUNTER — Other Ambulatory Visit: Payer: Self-pay

## 2023-09-02 ENCOUNTER — Encounter (HOSPITAL_COMMUNITY): Payer: Self-pay

## 2023-09-02 ENCOUNTER — Ambulatory Visit (INDEPENDENT_AMBULATORY_CARE_PROVIDER_SITE_OTHER): Admitting: Cardiology

## 2023-09-02 ENCOUNTER — Inpatient Hospital Stay (HOSPITAL_COMMUNITY)
Admission: EM | Admit: 2023-09-02 | Discharge: 2023-09-06 | DRG: 308 | Disposition: A | Discharge status: Skilled Nursing Facility | Source: Ambulatory Visit | Attending: Student | Admitting: Student

## 2023-09-02 ENCOUNTER — Encounter: Payer: Self-pay | Admitting: Cardiology

## 2023-09-02 VITALS — BP 90/70 | HR 151 | Ht 60.0 in | Wt 113.0 lb

## 2023-09-02 DIAGNOSIS — J189 Pneumonia, unspecified organism: Secondary | ICD-10-CM | POA: Diagnosis present

## 2023-09-02 DIAGNOSIS — Z993 Dependence on wheelchair: Secondary | ICD-10-CM

## 2023-09-02 DIAGNOSIS — R7989 Other specified abnormal findings of blood chemistry: Secondary | ICD-10-CM | POA: Diagnosis present

## 2023-09-02 DIAGNOSIS — Z841 Family history of disorders of kidney and ureter: Secondary | ICD-10-CM

## 2023-09-02 DIAGNOSIS — Z8042 Family history of malignant neoplasm of prostate: Secondary | ICD-10-CM

## 2023-09-02 DIAGNOSIS — Q2112 Patent foramen ovale: Secondary | ICD-10-CM

## 2023-09-02 DIAGNOSIS — I5020 Unspecified systolic (congestive) heart failure: Secondary | ICD-10-CM

## 2023-09-02 DIAGNOSIS — I4719 Other supraventricular tachycardia: Secondary | ICD-10-CM | POA: Diagnosis not present

## 2023-09-02 DIAGNOSIS — Z800 Family history of malignant neoplasm of digestive organs: Secondary | ICD-10-CM

## 2023-09-02 DIAGNOSIS — I10 Essential (primary) hypertension: Secondary | ICD-10-CM

## 2023-09-02 DIAGNOSIS — I2510 Atherosclerotic heart disease of native coronary artery without angina pectoris: Secondary | ICD-10-CM | POA: Diagnosis present

## 2023-09-02 DIAGNOSIS — I130 Hypertensive heart and chronic kidney disease with heart failure and stage 1 through stage 4 chronic kidney disease, or unspecified chronic kidney disease: Secondary | ICD-10-CM | POA: Diagnosis present

## 2023-09-02 DIAGNOSIS — Z66 Do not resuscitate: Secondary | ICD-10-CM | POA: Diagnosis present

## 2023-09-02 DIAGNOSIS — Z515 Encounter for palliative care: Secondary | ICD-10-CM

## 2023-09-02 DIAGNOSIS — D631 Anemia in chronic kidney disease: Secondary | ICD-10-CM | POA: Diagnosis present

## 2023-09-02 DIAGNOSIS — I4891 Unspecified atrial fibrillation: Principal | ICD-10-CM

## 2023-09-02 DIAGNOSIS — Z7982 Long term (current) use of aspirin: Secondary | ICD-10-CM

## 2023-09-02 DIAGNOSIS — I959 Hypotension, unspecified: Secondary | ICD-10-CM | POA: Diagnosis present

## 2023-09-02 DIAGNOSIS — Z8673 Personal history of transient ischemic attack (TIA), and cerebral infarction without residual deficits: Secondary | ICD-10-CM

## 2023-09-02 DIAGNOSIS — N3281 Overactive bladder: Secondary | ICD-10-CM | POA: Diagnosis present

## 2023-09-02 DIAGNOSIS — I5042 Chronic combined systolic (congestive) and diastolic (congestive) heart failure: Secondary | ICD-10-CM | POA: Diagnosis present

## 2023-09-02 DIAGNOSIS — B952 Enterococcus as the cause of diseases classified elsewhere: Secondary | ICD-10-CM | POA: Diagnosis present

## 2023-09-02 DIAGNOSIS — I480 Paroxysmal atrial fibrillation: Secondary | ICD-10-CM | POA: Insufficient documentation

## 2023-09-02 DIAGNOSIS — Z8249 Family history of ischemic heart disease and other diseases of the circulatory system: Secondary | ICD-10-CM

## 2023-09-02 DIAGNOSIS — R262 Difficulty in walking, not elsewhere classified: Secondary | ICD-10-CM | POA: Diagnosis present

## 2023-09-02 DIAGNOSIS — R54 Age-related physical debility: Secondary | ICD-10-CM | POA: Diagnosis present

## 2023-09-02 DIAGNOSIS — N1832 Chronic kidney disease, stage 3b: Secondary | ICD-10-CM | POA: Diagnosis present

## 2023-09-02 DIAGNOSIS — Z818 Family history of other mental and behavioral disorders: Secondary | ICD-10-CM

## 2023-09-02 DIAGNOSIS — Z79899 Other long term (current) drug therapy: Secondary | ICD-10-CM

## 2023-09-02 DIAGNOSIS — Z888 Allergy status to other drugs, medicaments and biological substances status: Secondary | ICD-10-CM

## 2023-09-02 DIAGNOSIS — Z7401 Bed confinement status: Secondary | ICD-10-CM

## 2023-09-02 DIAGNOSIS — Z87442 Personal history of urinary calculi: Secondary | ICD-10-CM

## 2023-09-02 DIAGNOSIS — L89152 Pressure ulcer of sacral region, stage 2: Secondary | ICD-10-CM | POA: Diagnosis present

## 2023-09-02 DIAGNOSIS — Z1152 Encounter for screening for COVID-19: Secondary | ICD-10-CM

## 2023-09-02 DIAGNOSIS — J90 Pleural effusion, not elsewhere classified: Secondary | ICD-10-CM | POA: Insufficient documentation

## 2023-09-02 DIAGNOSIS — I340 Nonrheumatic mitral (valve) insufficiency: Secondary | ICD-10-CM | POA: Diagnosis present

## 2023-09-02 DIAGNOSIS — E785 Hyperlipidemia, unspecified: Secondary | ICD-10-CM | POA: Diagnosis present

## 2023-09-02 DIAGNOSIS — K5900 Constipation, unspecified: Secondary | ICD-10-CM | POA: Diagnosis present

## 2023-09-02 DIAGNOSIS — Z96651 Presence of right artificial knee joint: Secondary | ICD-10-CM | POA: Diagnosis present

## 2023-09-02 DIAGNOSIS — N3000 Acute cystitis without hematuria: Secondary | ICD-10-CM | POA: Diagnosis present

## 2023-09-02 DIAGNOSIS — D638 Anemia in other chronic diseases classified elsewhere: Secondary | ICD-10-CM | POA: Diagnosis present

## 2023-09-02 DIAGNOSIS — Z90710 Acquired absence of both cervix and uterus: Secondary | ICD-10-CM

## 2023-09-02 DIAGNOSIS — I2489 Other forms of acute ischemic heart disease: Secondary | ICD-10-CM | POA: Diagnosis present

## 2023-09-02 LAB — URINALYSIS, W/ REFLEX TO CULTURE (INFECTION SUSPECTED)
Bilirubin Urine: NEGATIVE
Glucose, UA: NEGATIVE mg/dL
Hgb urine dipstick: NEGATIVE
Ketones, ur: NEGATIVE mg/dL
Nitrite: NEGATIVE
Protein, ur: 100 mg/dL — AB
Specific Gravity, Urine: 1.018 (ref 1.005–1.030)
WBC, UA: >50 WBC/hpf (ref 0–5)
pH: 6 (ref 5.0–8.0)

## 2023-09-02 LAB — COMPREHENSIVE METABOLIC PANEL WITH GFR
ALT: 16 U/L (ref 0–44)
AST: 38 U/L (ref 15–41)
Albumin: 2.2 g/dL — ABNORMAL LOW (ref 3.5–5.0)
Alkaline Phosphatase: 56 U/L (ref 38–126)
Anion gap: 10 (ref 5–15)
BUN: 10 mg/dL (ref 8–23)
CO2: 25 mmol/L (ref 22–32)
Calcium: 8.6 mg/dL — ABNORMAL LOW (ref 8.9–10.3)
Chloride: 100 mmol/L (ref 98–111)
Creatinine, Ser: 0.83 mg/dL (ref 0.44–1.00)
GFR, Estimated: >60 mL/min (ref >60)
Glucose, Bld: 118 mg/dL — ABNORMAL HIGH (ref 70–99)
Potassium: 4.3 mmol/L (ref 3.5–5.1)
Sodium: 135 mmol/L (ref 135–145)
Total Bilirubin: 0.9 mg/dL (ref 0.0–1.2)
Total Protein: 5.3 g/dL — ABNORMAL LOW (ref 6.5–8.1)

## 2023-09-02 LAB — CBC WITH DIFFERENTIAL/PLATELET
Abs Immature Granulocytes: 0.03 10*3/uL (ref 0.00–0.07)
Basophils Absolute: 0 10*3/uL (ref 0.0–0.1)
Basophils Relative: 0 %
Eosinophils Absolute: 0.2 10*3/uL (ref 0.0–0.5)
Eosinophils Relative: 3 %
HCT: 34.3 % — ABNORMAL LOW (ref 36.0–46.0)
Hemoglobin: 11.2 g/dL — ABNORMAL LOW (ref 12.0–15.0)
Immature Granulocytes: 1 %
Lymphocytes Relative: 11 %
Lymphs Abs: 0.7 10*3/uL (ref 0.7–4.0)
MCH: 31.9 pg (ref 26.0–34.0)
MCHC: 32.7 g/dL (ref 30.0–36.0)
MCV: 97.7 fL (ref 80.0–100.0)
Monocytes Absolute: 0.4 10*3/uL (ref 0.1–1.0)
Monocytes Relative: 5 %
Neutro Abs: 5.3 10*3/uL (ref 1.7–7.7)
Neutrophils Relative %: 80 %
Platelets: 221 10*3/uL (ref 150–400)
RBC: 3.51 MIL/uL — ABNORMAL LOW (ref 3.87–5.11)
RDW: 18.1 % — ABNORMAL HIGH (ref 11.5–15.5)
WBC: 6.6 10*3/uL (ref 4.0–10.5)
nRBC: 0 % (ref 0.0–0.2)

## 2023-09-02 LAB — RESP PANEL BY RT-PCR (RSV, FLU A&B, COVID)  RVPGX2
Influenza A by PCR: NEGATIVE
Influenza B by PCR: NEGATIVE
Resp Syncytial Virus by PCR: NEGATIVE
SARS Coronavirus 2 by RT PCR: NEGATIVE

## 2023-09-02 LAB — TROPONIN I (HIGH SENSITIVITY)
Troponin I (High Sensitivity): 103 ng/L (ref <18)
Troponin I (High Sensitivity): 113 ng/L (ref <18)

## 2023-09-02 LAB — LIPASE, BLOOD: Lipase: 22 U/L (ref 11–51)

## 2023-09-02 LAB — BRAIN NATRIURETIC PEPTIDE: B Natriuretic Peptide: 419.6 pg/mL — ABNORMAL HIGH (ref 0.0–100.0)

## 2023-09-02 MED ORDER — METOPROLOL TARTRATE 5 MG/5ML IV SOLN
5.0000 mg | Freq: Once | INTRAVENOUS | Status: AC
  Administered 2023-09-02: 2.5 mg via INTRAVENOUS
  Filled 2023-09-02: qty 5

## 2023-09-02 NOTE — ED Notes (Signed)
 Pt requesting food, MD Long messaged at this time for diet order.

## 2023-09-02 NOTE — ED Notes (Signed)
 Pt readjusted in bed for comfort at this time. Pt informed that this RN inquired about diet order. Pt denies any CP/SOB at this time, states she is comfortable. Family at bedside.

## 2023-09-02 NOTE — Consult Note (Addendum)
 Cardiology Consultation   Patient ID: Holly Nunez MRN: [id number]; DOB: 12-10-1924  Admit date: 09/02/2023 Date of Consult: 09/02/2023  PCP:  Default, Provider, MD   [facility] Providers Cardiologist:  Holly Angel, MD   Patient Profile:   Holly Nunez is a 89 y.o. female with a hx of PAF, chronic systolic heart failure, TIA, hypertension, hyperlipidemia, PFO who is being seen 09/02/2023 for the evaluation of A-fib with RVR at the request of Dr. Dolan Freiberg .  History of Present Illness:   Holly Nunez had a prior echocardiogram November 2014 that showed normal LVEF with mild to moderate LVH, grade 2 DD, aneurysmal atrial septum with PFO.  She has a known history of TIAs.  She was previously on DAPT with aspirin  and Plavix however had recurrence of TIA and placed on low-dose Xarelto .  On 716 she had coronary calcifications on CT scan and underwent nuclear stress test 02/2015 with normal perfusion.   She was hospitalized 04/17/2019 for and treated for UTI.  She was evaluated by 06/09/2023 for preoperative risk evaluation prior to cystoscopy with lithotripsy and stent placement.  Creatinine has been elevated.   She was most recently hospitalized 9//25 with shortness of breath and hypoxia found to be in A-fib RVR.  She was placed on IV Cardizem  and subsequently converted to sinus rhythm.  Through shared decision making she was not restarted on Xarelto .  She underwent right-sided thoracentesis with only 50 cc transudative fluid.  Echocardiogram showed an LVEF 40-45% with global hypokinesis and severe LVH, grade 2 DD, normal RV function, severe LAE, moderate MR, moderate MAC.  Given reduction in EF, Cardizem  was subsequently discontinued and she was started on metoprolol  25 mg twice daily.  She was discharged on 08/23/2023.  She was seen in clinic today/18/2025 and was back in A-fib RVR with shortness of breath.  She was referred back to the ER for further management.  On arrival to the ER,  EKG shows A-fib with VR 134.  Cardiology was asked to evaluate.  BP 129/85 Creatinine 0.83, K4.3 Albumin  2.2  During my interview, telemetry appears to have p waves, possibly atrial bigeminy vs MAT.   Daughter is bedside. Pt resides at [facility], she is unsure if she has been having symptoms. Pt reports mild orthopnea. She is now bedbound and mostly sleeping on exam. No chest pain and seems asymptomatic with heart rate.    Past Medical History:  Diagnosis Date   Anemia    Arthritis    CKD (chronic kidney disease), stage III (HCC)    3B   Coronary artery disease    Dysphagia    pureed diet   Estrogen deficiency 05/27/2016   Falls    Glaucoma    Great toe pain, left 12/02/2016   H/O measles    H/O mumps    History of chicken pox    Hyperglycemia 05/27/2016   Hyperlipidemia    Hypertension    OAB (overactive bladder)    PAF (paroxysmal atrial fibrillation) (HCC)    Patent foramen ovale    Preventative health care 02/26/2015   Psoas hematoma, left, secondary to anticoagulant therapy 05/14/2021   Thrombocytopenia (HCC)    Thyroid  nodule    Biopsy-negative   TIA (transient ischemic attack)    Urinary incontinence    Urolithiasis 07/12/2023    Past Surgical History:  Procedure Laterality Date   ABDOMINAL HYSTERECTOMY     fibroid   APPENDECTOMY     CYSTOSCOPY W/ URETERAL STENT PLACEMENT  Right 07/18/2023   Procedure: CYSTOSCOPY WITH RETROGRADE PYELOGRAM/URETERAL STENT PLACEMENT;  Surgeon: Mellie Sprinkle., MD;  Location: [facility];  Service: Urology;  Laterality: Right;   CYSTOSCOPY WITH RETROGRADE PYELOGRAM, URETEROSCOPY AND STENT PLACEMENT Right 03/19/2023   Procedure: CYSTO-URETEROSCOPY; RIGHT URETERAL STENT PLACEMENT;  Surgeon: Mellie Sprinkle., MD;  Location: [facility] OR;  Service: Urology;  Laterality: Right;   EYE SURGERY Bilateral    CATARACT REMOVAL   JOINT REPLACEMENT     REPLACEMENT TOTAL KNEE Right 2007   THYROID  LOBECTOMY Left 04/25/2015   THYROID  LOBECTOMY  Left 04/25/2015   Procedure: LEFT THYROID  LOBECTOMY;  Surgeon: Oralee Billow, MD;  Location: [facility] OR;  Service: General;  Laterality: Left;   TRIGGER FINGER RELEASE  2007   URETEROSCOPY WITH HOLMIUM LASER LITHOTRIPSY Right 07/18/2023   Procedure: URETEROSCOPY WITH HOLMIUM LASER LITHOTRIPSY;  Surgeon: Mellie Sprinkle., MD;  Location: [facility];  Service: Urology;  Laterality: Right;     Home Medications:  Prior to Admission medications   Medication Sig Start Date End Date Taking? Authorizing Provider  acetaminophen  (TYLENOL ) 650 MG CR tablet Take 1 tablet (650 mg total) by mouth in the morning, at noon, and at bedtime. May also give 2 tablets every 6 hours as needed for pain 08/23/23   Rai, Hurman Maiden, MD  aspirin  EC 81 MG tablet Take 81 mg by mouth in the morning.    [provider]  atorvastatin  (LIPITOR) 20 MG tablet Take 20 mg by mouth in the morning.    [provider]  COMBIGAN  0.2-0.5 % ophthalmic solution Place 1 drop into both eyes in the morning and at bedtime. 03/14/23   [provider]  Dextran 70-Hypromellose 0.1-0.3 % SOLN Apply 2 drops to eye every 12 (twelve) hours as needed (dry, itchy, irritated eyes).    [provider]  dextromethorphan-guaiFENesin  (ROBITUSSIN-DM) 10-100 MG/5ML liquid Take 10 mLs by mouth every 6 (six) hours as needed for cough.    [provider]  folic acid  (FOLVITE ) 1 MG tablet Take 1 mg by mouth in the morning.    [provider]  latanoprost  (XALATAN ) 0.005 % ophthalmic solution Place 1 drop into both eyes at bedtime. 03/10/23   [provider]  melatonin 5 MG TABS Take 1 tablet (5 mg total) by mouth at bedtime as needed. 08/23/23   Rai, Hurman Maiden, MD  Menthol , Topical Analgesic, (BIOFREEZE COOL THE PAIN) 4 % GEL Apply 1 Application topically 3 (three) times daily. Apply topically to neck    [provider]  metoprolol  tartrate (LOPRESSOR ) 25 MG tablet Take 1 tablet (25 mg total) by mouth 2  (two) times daily. 08/23/23   Rai, Ripudeep K, MD  mirtazapine (REMERON) 7.5 MG tablet Take 7.5 mg by mouth at bedtime. 05/02/23   [provider]  Multiple Vitamin (MULTIVITAMIN WITH MINERALS) TABS tablet Take 1 tablet by mouth in the morning.    [provider]  nitroGLYCERIN  (NITROSTAT ) 0.4 MG SL tablet Place 1 tablet (0.4 mg total) under the tongue every 5 (five) minutes as needed for chest pain. 10/01/20 07/01/27  Lenise Quince, MD  NON FORMULARY Take 120 mLs by mouth in the morning and at bedtime. Med pass    [provider]  Omega 3 1000 MG CAPS Take 1,000 mg by mouth daily.    [provider]  ondansetron  (ZOFRAN ) 4 MG tablet Take 4 mg by mouth every 8 (eight) hours as needed for nausea or vomiting.    [provider]  polyethylene glycol (MIRALAX  / GLYCOLAX ) 17 g packet Take 17 g by mouth in the morning.    [provider]  Probiotic Product (PROBIOTIC PO) Take 1 capsule by mouth daily.    [provider]  senna-docusate (SENOKOT-S) 8.6-50 MG tablet Take 1 tablet by mouth 2 (two) times daily. 07/20/23 10/18/23  [place], Eric J, DO  torsemide  (DEMADEX ) 20 MG tablet Take 1 tablet (20 mg total) by mouth daily. 08/24/23   Rai, Ripudeep K, MD  Trolamine Salicylate (ASPERCREME EX) Apply 1 application  topically in the morning and at bedtime. Apply to both legs    [provider]  UNABLE TO FIND Take 120 mLs by mouth in the morning, at noon, and at bedtime. Med Name: [facility]    [provider]    Inpatient Medications: Scheduled Meds:  Continuous Infusions:  PRN Meds:   Allergies:    Allergies  Allergen Reactions   Alphagan  [Brimonidine ] Other (See Comments)    Secondary infection and vision changes. Allergy not listed on MAR     Social History:   Social History   Socioeconomic History   Marital status: Widowed    Spouse name: Not on file   Number of children: 4   Years of education: Not on file    Highest education level: Not on file  Occupational History   Not on file  Tobacco Use   Smoking status: Never   Smokeless tobacco: Never  Vaping Use   Vaping status: Never Used  Substance and Sexual Activity   Alcohol  use: No   Drug use: No   Sexual activity: Not Currently    Partners: Male    Comment: no dietary restrictions, lives with 2 daughters  Other Topics Concern   Not on file  Social History Narrative   No dietary restrictions and lives with daughters.   Social Drivers of Corporate investment banker Strain: Low Risk  (01/29/2021)   Overall Financial Resource Strain (CARDIA)    Difficulty of Paying Living Expenses: Not hard at all  Food Insecurity: No Food Insecurity (08/21/2023)   Hunger Vital Sign    Worried About Running Out of Food in the Last Year: Never true    Ran Out of Food in the Last Year: Never true  Transportation Needs: No Transportation Needs (08/21/2023)   PRAPARE - Administrator, Civil Service (Medical): No    Lack of Transportation (Non-Medical): No  Physical Activity: Inactive (01/29/2021)   Exercise Vital Sign    Days of Exercise per Week: 0 days    Minutes of Exercise per Session: 0 min  Stress: No Stress Concern Present (01/29/2021)   [employer] of Occupational Health - Occupational Stress Questionnaire    Feeling of Stress : Not at all  Social Connections: Patient Unable To Answer (08/21/2023)   Social Connection and Isolation Panel [NHANES]    Frequency of Communication with Friends and Family: Patient unable to answer    Frequency of Social Gatherings with Friends and Family: Patient unable to answer    Attends Religious Services: Patient unable to answer    Active Member of Clubs or Organizations: Patient unable to answer    Attends Banker Meetings: Patient unable to answer    Marital Status: Patient unable to answer  Intimate Partner Violence: Not At Risk (08/21/2023)   Humiliation, Afraid, Rape, and Kick  questionnaire    Fear of Current or Ex-Partner: No    Emotionally  Abused: No    Physically Abused: No    Sexually Abused: No    Family History:    Family History  Problem Relation Age of Onset   Heart disease Mother    Hypertension Mother    Kidney disease Sister    Mental illness Sister        suicide, depresion   Gout Brother    Hypertension Son    Cancer Maternal Grandmother    Cancer Sister    Bone cancer Sister    Cancer Sister    Cancer Sister        kidney   Cancer Daughter    Pancreatic cancer Daughter    Thyroid  disease Daughter    Thyroid  disease Daughter        thyroid  nodules   Hypertension Daughter    Irritable bowel syndrome Daughter    Cancer Daughter        thyroid    Cancer Brother    Prostate cancer Brother      ROS:  Please see the history of present illness.   All other ROS reviewed and negative.     Physical Exam/Data:   Vitals:   09/02/23 1141 09/02/23 1145  BP:  129/85  Pulse:  72  Resp:  17  Temp:  97.7 F (36.5 C)  TempSrc:  Oral  SpO2: 98% 100%   No intake or output data in the 24 hours ending 09/02/23 1428    09/02/2023   10:02 AM 08/23/2023    7:08 AM 08/22/2023    5:00 AM  Last 3 Weights  Weight (lbs) 113 lb 115 lb 8.3 oz 116 lb 6.5 oz  Weight (kg) 51.256 kg 52.4 kg 52.8 kg     There is no height or weight on file to calculate BMI.  General:  elderly female, frail, cachectic  HEENT: normal Neck: no JVD Vascular: No carotid bruits; Distal pulses 2+ bilaterally Cardiac:  irregular rhythm, tachycardic rate Lungs:  clear to auscultation bilaterally, no wheezing, rhonchi or rales  Abd: soft, nontender, no hepatomegaly  Ext: no edema Musculoskeletal:  No deformities, BUE and BLE strength normal and equal Skin: warm and dry  Neuro:  CNs 2-12 intact, no focal abnormalities noted Psych:  Normal affect   EKG:  The EKG was personally reviewed and demonstrates:  Afib with VR 137 Telemetry:  Telemetry was personally reviewed and  demonstrates:  appears initially Afib with RVR, now p waves present, question atrial bigeminy vs SVT  Relevant CV Studies:  Echo 08/21/23:  1. Left ventricular ejection fraction, by estimation, is 40 to 45%. The  left ventricle has mildly decreased function. The left ventricle  demonstrates global hypokinesis. There is severe left ventricular  hypertrophy. Left ventricular diastolic parameters   are consistent with Grade II diastolic dysfunction (pseudonormalization).   2. Right ventricular systolic function is normal. The right ventricular  size is normal.   3. Left atrial size was severely dilated.   4. Large pleural effusion in the left lateral region.   5. The mitral valve is normal in structure. Moderate mitral valve  regurgitation. No evidence of mitral stenosis. Moderate mitral annular  calcification.   6. The aortic valve is normal in structure. There is mild calcification  of the aortic valve. There is mild thickening of the aortic valve. Aortic  valve regurgitation is not visualized. Aortic valve sclerosis is present,  with no evidence of aortic valve  stenosis.   7. The inferior vena cava is normal in  size with greater than 50%  respiratory variability, suggesting right atrial pressure of 3 mmHg.   8. Evidence of atrial level shunting detected by color flow Doppler.    Laboratory Data:  High Sensitivity Troponin:   Recent Labs  Lab 09/02/23 1228  TROPONINIHS 103*     Chemistry Recent Labs  Lab 09/02/23 1228  NA 135  K 4.3  CL 100  CO2 25  GLUCOSE 118*  BUN 10  CREATININE 0.83  CALCIUM  8.6*  GFRNONAA >60  ANIONGAP 10    Recent Labs  Lab 09/02/23 1228  PROT 5.3*  ALBUMIN  2.2*  AST 38  ALT 16  ALKPHOS 56  BILITOT 0.9   Lipids No results for input(s): "CHOL", "TRIG", "HDL", "LABVLDL", "LDLCALC", "CHOLHDL" in the last 168 hours.  HematologyNo results for input(s): "WBC", "RBC", "HGB", "HCT", "MCV", "MCH", "MCHC", "RDW", "PLT" in the last 168  hours. Thyroid  No results for input(s): "TSH", "FREET4" in the last 168 hours.  BNPNo results for input(s): "BNP", "PROBNP" in the last 168 hours.  DDimer No results for input(s): "DDIMER" in the last 168 hours.   Radiology/Studies:  No results found.   Assessment and Plan:   Atrial bigeminy vs MAT - on lopressor  25 mg BID - cardizem  discontinued due to reduced EF - BP labile - was hypotensive on initial arrival - will increase lopressor  to four times daily   PAF I am not sure its safe to use digoxin  in this patient without frequent lab checks. She is not on anticoagulation (hx of bleed with psoas hematoma), no longer a fall risk due to bedbound status. Family confirms no anticoagulation.   Chronic systolic and diastolic heart failure Moderate MR Severe LVH - LVEF 40-45% with sever LVH, grade 2 DD - has been on 20 mg torsemide , per chart - BNP 420 - does not appear volume up, question of mild orthopnea per patient report - continue 20 mg torsemide , will not add IV diuresis at this point   Hypertension - only on 25 mg lopressor  BID   Appears she is in a multifocal atrial tachycardia.  I think at this point options are limited - given severe LVH and diastolic dysfunction, I think restoration of SR or at least rate control would hopefully prevent CHF exacerbation.  Will for now increase her BB.     Risk Assessment/Risk Scores:        For questions or updates, please contact [facility] Please consult [email or web address] for contact info under    Signed, Lamond Pilot, PA  09/02/2023 2:28 PM  History and all data above reviewed.  Patient examined.  I agree with the findings as above.  The patient is at a nursing home.  She complains of some aches and pains and told her daughter she has been complaining of suprapubic pain.  The patient really cannot describe this.  Her daughter is at the bedside.  Another daughter on the phone is a Engineer, civil (consulting).  She has not had any  new acute shortness of breath that anybody is witnessed.  She has not had any chest pressure, neck or arm discomfort.  She has been sleeping relatively flat.  She denies symptoms and her daughters do give the history because the patient has some at least short-term memory deficit.  She came to the office where she was found to have tachycardia felt to be atrial fibrillation.The patient exam reveals [id number] rate and rhythm tachycardia with ectopy  ,  Lungs: Decreased breath sounds at  the bases  ,  Abd: Positive bowel sounds, no rebound no guarding, Ext No edema  .  All available labs, radiology testing, previous records reviewed. Agree with documented assessment and plan.   Tachycardia:  the EKG and rhythm strips show organized P wave activity indicating MAT or sinus tach with PACs.  She does however have a history of PAF and has been deemed not to be an anticoagulation candidate.  She has had prior bleeding on anticoagulation.  The plan therefore would be rate control which will be somewhat difficult secondary to her BP which is labile.  We can see if she tolerates a slightly increased dose of beta blocker.  Chronic diastolic HF:  Her BNP is mildly elevated.  However, her presentation is not one of volume overload.   She has pleural effusions likely on CXR and had this on the last presentation earlier this month.  She had thoracentesis.  I don't think this will be indicated again.  In the absence of acute respiratory decompensation and her labile condition I would avoid over diuresis and agree with continued low dose PO diuretic.    Suprapubic pain:  UA is pending.    Royston Cornea Truitt Cruey  4:23 PM  09/02/2023

## 2023-09-02 NOTE — ED Provider Notes (Signed)
 Emergency Department Provider Note   I have reviewed the triage vital signs and the nursing notes.   HISTORY  Chief Complaint A-fib w/RVR   HPI Holly Nunez is a 89 y.o. female with past history of CAD, congestive heart failure, paroxysmal A-fib not anticoagulated due to fall risk presents with increased shortness of breath from heart failure clinic.  She has had symptoms over the past 1 to 2 days.  Symptoms are worse with exertion.  She has been compliant with her home medications.  In clinic today she was found to be in A-fib with RVR.  She had a similar admission this past month with initial response to diltiazem .  This was discontinued as her echo showed reduced ejection fraction.  Patient denies any chest pain.   Past Medical History:  Diagnosis Date   Anemia    Arthritis    CKD (chronic kidney disease), stage III (HCC)    3B   Coronary artery disease    Dysphagia    pureed diet   Glaucoma    Hyperlipidemia    Hypertension    OAB (overactive bladder)    PAF (paroxysmal atrial fibrillation) (HCC)    Patent foramen ovale    Psoas hematoma, left, secondary to anticoagulant therapy 05/14/2021   Thrombocytopenia (HCC)    Thyroid  nodule    Biopsy-negative   TIA (transient ischemic attack)    Urolithiasis 07/12/2023    Review of Systems  Constitutional: No fever/chills Cardiovascular: Denies chest pain. Respiratory: Positive shortness of breath. Gastrointestinal: No abdominal pain.  No nausea, no vomiting.  Musculoskeletal: Negative for back pain. Skin: Negative for rash. Neurological: Negative for headaches, focal weakness or numbness.  ____________________________________________   PHYSICAL EXAM:  VITAL SIGNS: ED Triage Vitals  Encounter Vitals Group     BP 09/02/23 1145 129/85     Pulse Rate 09/02/23 1145 72     Resp 09/02/23 1145 17     Temp 09/02/23 1145 97.7 F (36.5 C)     Temp Source 09/02/23 1145 Oral     SpO2 09/02/23 1141 98 %    Constitutional: Alert and oriented. Well appearing and in no acute distress. Eyes: Conjunctivae are normal. Head: Atraumatic. Nose: No congestion/rhinnorhea. Mouth/Throat: Mucous membranes are moist. Neck: No stridor.   Cardiovascular: A fib RVR. Good peripheral circulation. Grossly normal heart sounds.   Respiratory: Normal respiratory effort.  No retractions. Lungs CTAB. Gastrointestinal: Soft and nontender. No distention.  Musculoskeletal: No lower extremity tenderness nor edema. No gross deformities of extremities. Neurologic:  Normal speech and language.  Skin:  Skin is warm, dry and intact. No rash noted.  ____________________________________________   LABS (all labs ordered are listed, but only abnormal results are displayed)  Labs Reviewed  COMPREHENSIVE METABOLIC PANEL WITH GFR - Abnormal; Notable for the following components:      Result Value   Glucose, Bld 118 (*)    Calcium  8.6 (*)    Total Protein 5.3 (*)    Albumin  2.2 (*)    All other components within normal limits  URINALYSIS, W/ REFLEX TO CULTURE (INFECTION SUSPECTED) - Abnormal; Notable for the following components:   Color, Urine AMBER (*)    APPearance CLOUDY (*)    Protein, ur 100 (*)    Leukocytes,Ua LARGE (*)    Bacteria, UA RARE (*)    Non Squamous Epithelial 0-5 (*)    Crystals PRESENT (*)    All other components within normal limits  CBC WITH DIFFERENTIAL/PLATELET - Abnormal; Notable  for the following components:   RBC 3.51 (*)    Hemoglobin 11.2 (*)    HCT 34.3 (*)    RDW 18.1 (*)    All other components within normal limits  BRAIN NATRIURETIC PEPTIDE - Abnormal; Notable for the following components:   B Natriuretic Peptide 419.6 (*)    All other components within normal limits  CBC WITH DIFFERENTIAL/PLATELET - Abnormal; Notable for the following components:   RBC 3.20 (*)    Hemoglobin 10.2 (*)    HCT 30.6 (*)    RDW 18.0 (*)    All other components within normal limits   COMPREHENSIVE METABOLIC PANEL WITH GFR - Abnormal; Notable for the following components:   Calcium  8.7 (*)    Total Protein 5.1 (*)    Albumin  2.1 (*)    All other components within normal limits  MAGNESIUM  - Abnormal; Notable for the following components:   Magnesium  1.5 (*)    All other components within normal limits  BRAIN NATRIURETIC PEPTIDE - Abnormal; Notable for the following components:   B Natriuretic Peptide 449.9 (*)    All other components within normal limits  TROPONIN I (HIGH SENSITIVITY) - Abnormal; Notable for the following components:   Troponin I (High Sensitivity) 103 (*)    All other components within normal limits  TROPONIN I (HIGH SENSITIVITY) - Abnormal; Notable for the following components:   Troponin I (High Sensitivity) 113 (*)    All other components within normal limits  TROPONIN I (HIGH SENSITIVITY) - Abnormal; Notable for the following components:   Troponin I (High Sensitivity) 165 (*)    All other components within normal limits  RESP PANEL BY RT-PCR (RSV, FLU A&B, COVID)  [id number]  URINE CULTURE  LIPASE, BLOOD  TSH  PROCALCITONIN  CBC WITH DIFFERENTIAL/PLATELET   ____________________________________________  EKG   EKG Interpretation Date/Time:  Friday September 02 2023 12:00:25 EDT Ventricular Rate:  134 PR Interval:    QRS Duration:  72 QT Interval:  342 QTC Calculation: 511 R Axis:   87  Text Interpretation: Atrial fibrillation Low voltage, extremity leads Prolonged QT interval Confirmed by Darra Chew [phone]) on 09/02/2023 2:27:09 PM        ____________________________________________  RADIOLOGY  DG Chest Portable 1 View Result Date: 09/02/2023 CLINICAL DATA:  Shortness of breath EXAM: PORTABLE CHEST 1 VIEW COMPARISON:  Chest radiograph dated 08/20/2023 FINDINGS: Normal lung volumes. Dense left retrocardiac opacity. Suspected layering bilateral pleural effusions. No pneumothorax. The heart size and mediastinal contours are within  normal limits. No acute osseous abnormality. Surgical clips along the left neck. IMPRESSION: 1. Dense left retrocardiac opacity, which may represent atelectasis, aspiration, or pneumonia. 2. Suspected layering bilateral pleural effusions. Electronically Signed   By: Limin  Xu M.D.   On: 09/02/2023 14:26    ____________________________________________   PROCEDURES  Procedure(s) performed:   Procedures  CRITICAL CARE Performed by: Chew KANDICE Darra Total critical care time: 35 minutes Critical care time was exclusive of separately billable procedures and treating other patients. Critical care was necessary to treat or prevent imminent or life-threatening deterioration. Critical care was time spent personally by me on the following activities: development of treatment plan with patient and/or surrogate as well as nursing, discussions with consultants, evaluation of patient's response to treatment, examination of patient, obtaining history from patient or surrogate, ordering and performing treatments and interventions, ordering and review of laboratory studies, ordering and review of radiographic studies, pulse oximetry and re-evaluation of patient's condition.  Chew Darra, MD  Emergency Medicine  ____________________________________________   INITIAL IMPRESSION / ASSESSMENT AND PLAN / ED COURSE  Pertinent labs & imaging results that were available during my care of the patient were reviewed by me and considered in my medical decision making (see chart for details).   This patient is Presenting for Evaluation of SOB, which does require a range of treatment options, and is a complaint that involves a high risk of morbidity and mortality.  The Differential Diagnoses includes but is not exclusive to acute coronary syndrome, aortic dissection, pulmonary embolism, cardiac tamponade, community-acquired pneumonia, pericarditis, musculoskeletal chest wall pain, etc.   Critical Interventions-     Medications  acetaminophen  (TYLENOL ) tablet 650 mg (has no administration in time range)    Or  acetaminophen  (TYLENOL ) suppository 650 mg (has no administration in time range)  melatonin tablet 3 mg (has no administration in time range)  ondansetron  (ZOFRAN ) injection 4 mg (has no administration in time range)  metoprolol  tartrate (LOPRESSOR ) tablet 25 mg (25 mg Oral Given 09/03/23 0640)  metoprolol  tartrate (LOPRESSOR ) injection 2.5 mg (2.5 mg Intravenous Given 09/03/23 0350)  cefTRIAXone  (ROCEPHIN ) 2 g in sodium chloride  0.9 % 100 mL IVPB (0 g Intravenous Stopped 09/03/23 0322)  aspirin  EC tablet 81 mg (81 mg Oral Given 09/03/23 0859)  atorvastatin  (LIPITOR) tablet 20 mg (20 mg Oral Given 09/03/23 0859)  potassium chloride  (KLOR-CON  M) CR tablet 10 mEq (has no administration in time range)  Oral care mouth rinse (has no administration in time range)  feeding supplement (ENSURE ENLIVE / ENSURE PLUS) liquid 237 mL (237 mLs Oral Given 09/03/23 0902)  torsemide  (DEMADEX ) tablet 20 mg (has no administration in time range)  metoprolol  tartrate (LOPRESSOR ) injection 5 mg (2.5 mg Intravenous Given 09/02/23 1256)  magnesium  sulfate IVPB 2 g 50 mL (2 g Intravenous New Bag/Given 09/03/23 0858)    Reassessment after intervention:  HR improved.    I did obtain Additional Historical Information from daughter at bedside.  I decided to review pertinent External Data, and in summary patient seen in heart failure clinic earlier. A fib RVR on arrival. Question starting Digoxin  in review of note. Will consult with Cardiology.   Clinical Laboratory Tests Ordered, included mg slightly low at 1.5. Normal K. No AKI. Troponin mildly elevated. Likely demand in the setting of a fib RVR.   Radiologic Tests Ordered, included CXR. I independently interpreted the images and agree with radiology interpretation.   Cardiac Monitor Tracing which shows NSR.    Social Determinants of Health Risk patient patient is a  non-smoker.   Consult complete with Cardiology. Plan for admit.   Medical Decision Making: Summary:  Patient sent over from heart failure clinic with A-fib RVR.  Digoxin  not a good option due to reduced ejection fraction on recent echo.  Will give metoprolol  and consult cardiology for admission.  Patient's presentation is most consistent with acute presentation with potential threat to life or bodily function.   Disposition: admit  ____________________________________________  FINAL CLINICAL IMPRESSION(S) / ED DIAGNOSES  Final diagnoses:  Atrial fibrillation with RVR (HCC)     Note:  This document was prepared using Dragon voice recognition software and may include unintentional dictation errors.  Fonda Law, MD, [facility] Emergency Medicine    Erline Siddoway, Fonda MATSU, MD 09/04/23 [phone]

## 2023-09-02 NOTE — ED Triage Notes (Signed)
 Pt arrived via [facility] from Heart failure clinic due to A-fib w/RVR. Per EMS, highest HR 186-190, but mainly stayed in 130-170. Pt lives at [facility].

## 2023-09-03 ENCOUNTER — Encounter (HOSPITAL_COMMUNITY): Payer: Self-pay | Admitting: Internal Medicine

## 2023-09-03 DIAGNOSIS — Z1152 Encounter for screening for COVID-19: Secondary | ICD-10-CM | POA: Diagnosis not present

## 2023-09-03 DIAGNOSIS — N3281 Overactive bladder: Secondary | ICD-10-CM | POA: Diagnosis present

## 2023-09-03 DIAGNOSIS — I5042 Chronic combined systolic (congestive) and diastolic (congestive) heart failure: Secondary | ICD-10-CM | POA: Diagnosis present

## 2023-09-03 DIAGNOSIS — L89152 Pressure ulcer of sacral region, stage 2: Secondary | ICD-10-CM | POA: Diagnosis present

## 2023-09-03 DIAGNOSIS — E785 Hyperlipidemia, unspecified: Secondary | ICD-10-CM | POA: Diagnosis present

## 2023-09-03 DIAGNOSIS — N3000 Acute cystitis without hematuria: Secondary | ICD-10-CM | POA: Diagnosis present

## 2023-09-03 DIAGNOSIS — I4719 Other supraventricular tachycardia: Secondary | ICD-10-CM | POA: Diagnosis present

## 2023-09-03 DIAGNOSIS — I959 Hypotension, unspecified: Secondary | ICD-10-CM

## 2023-09-03 DIAGNOSIS — R7989 Other specified abnormal findings of blood chemistry: Secondary | ICD-10-CM | POA: Diagnosis present

## 2023-09-03 DIAGNOSIS — J189 Pneumonia, unspecified organism: Secondary | ICD-10-CM | POA: Diagnosis present

## 2023-09-03 DIAGNOSIS — I4891 Unspecified atrial fibrillation: Secondary | ICD-10-CM | POA: Diagnosis present

## 2023-09-03 DIAGNOSIS — K5900 Constipation, unspecified: Secondary | ICD-10-CM | POA: Diagnosis present

## 2023-09-03 DIAGNOSIS — I480 Paroxysmal atrial fibrillation: Secondary | ICD-10-CM

## 2023-09-03 DIAGNOSIS — I2510 Atherosclerotic heart disease of native coronary artery without angina pectoris: Secondary | ICD-10-CM | POA: Diagnosis present

## 2023-09-03 DIAGNOSIS — D638 Anemia in other chronic diseases classified elsewhere: Secondary | ICD-10-CM | POA: Diagnosis not present

## 2023-09-03 DIAGNOSIS — I340 Nonrheumatic mitral (valve) insufficiency: Secondary | ICD-10-CM | POA: Diagnosis present

## 2023-09-03 DIAGNOSIS — B952 Enterococcus as the cause of diseases classified elsewhere: Secondary | ICD-10-CM | POA: Diagnosis present

## 2023-09-03 DIAGNOSIS — E782 Mixed hyperlipidemia: Secondary | ICD-10-CM | POA: Diagnosis not present

## 2023-09-03 DIAGNOSIS — Z993 Dependence on wheelchair: Secondary | ICD-10-CM | POA: Diagnosis not present

## 2023-09-03 DIAGNOSIS — Z7189 Other specified counseling: Secondary | ICD-10-CM | POA: Diagnosis not present

## 2023-09-03 DIAGNOSIS — Z79899 Other long term (current) drug therapy: Secondary | ICD-10-CM | POA: Diagnosis not present

## 2023-09-03 DIAGNOSIS — Z66 Do not resuscitate: Secondary | ICD-10-CM | POA: Diagnosis present

## 2023-09-03 DIAGNOSIS — Z515 Encounter for palliative care: Secondary | ICD-10-CM | POA: Diagnosis not present

## 2023-09-03 DIAGNOSIS — Z96651 Presence of right artificial knee joint: Secondary | ICD-10-CM | POA: Diagnosis present

## 2023-09-03 DIAGNOSIS — Q2112 Patent foramen ovale: Secondary | ICD-10-CM | POA: Diagnosis not present

## 2023-09-03 DIAGNOSIS — D631 Anemia in chronic kidney disease: Secondary | ICD-10-CM | POA: Diagnosis present

## 2023-09-03 DIAGNOSIS — I130 Hypertensive heart and chronic kidney disease with heart failure and stage 1 through stage 4 chronic kidney disease, or unspecified chronic kidney disease: Secondary | ICD-10-CM | POA: Diagnosis present

## 2023-09-03 DIAGNOSIS — N1832 Chronic kidney disease, stage 3b: Secondary | ICD-10-CM | POA: Diagnosis present

## 2023-09-03 DIAGNOSIS — I2489 Other forms of acute ischemic heart disease: Secondary | ICD-10-CM | POA: Diagnosis present

## 2023-09-03 LAB — COMPREHENSIVE METABOLIC PANEL WITH GFR
ALT: 12 U/L (ref 0–44)
AST: 25 U/L (ref 15–41)
Albumin: 2.1 g/dL — ABNORMAL LOW (ref 3.5–5.0)
Alkaline Phosphatase: 52 U/L (ref 38–126)
Anion gap: 12 (ref 5–15)
BUN: 11 mg/dL (ref 8–23)
CO2: 26 mmol/L (ref 22–32)
Calcium: 8.7 mg/dL — ABNORMAL LOW (ref 8.9–10.3)
Chloride: 99 mmol/L (ref 98–111)
Creatinine, Ser: 0.84 mg/dL (ref 0.44–1.00)
GFR, Estimated: >60 mL/min (ref >60)
Glucose, Bld: 95 mg/dL (ref 70–99)
Potassium: 4.3 mmol/L (ref 3.5–5.1)
Sodium: 137 mmol/L (ref 135–145)
Total Bilirubin: 0.7 mg/dL (ref 0.0–1.2)
Total Protein: 5.1 g/dL — ABNORMAL LOW (ref 6.5–8.1)

## 2023-09-03 LAB — CBC WITH DIFFERENTIAL/PLATELET
Abs Immature Granulocytes: 0.03 10*3/uL (ref 0.00–0.07)
Basophils Absolute: 0 10*3/uL (ref 0.0–0.1)
Basophils Relative: 1 %
Eosinophils Absolute: 0 10*3/uL (ref 0.0–0.5)
Eosinophils Relative: 0 %
HCT: 30.6 % — ABNORMAL LOW (ref 36.0–46.0)
Hemoglobin: 10.2 g/dL — ABNORMAL LOW (ref 12.0–15.0)
Immature Granulocytes: 1 %
Lymphocytes Relative: 20 %
Lymphs Abs: 1.1 10*3/uL (ref 0.7–4.0)
MCH: 31.9 pg (ref 26.0–34.0)
MCHC: 33.3 g/dL (ref 30.0–36.0)
MCV: 95.6 fL (ref 80.0–100.0)
Monocytes Absolute: 0.5 10*3/uL (ref 0.1–1.0)
Monocytes Relative: 9 %
Neutro Abs: 3.7 10*3/uL (ref 1.7–7.7)
Neutrophils Relative %: 69 %
Platelets: 214 10*3/uL (ref 150–400)
RBC: 3.2 MIL/uL — ABNORMAL LOW (ref 3.87–5.11)
RDW: 18 % — ABNORMAL HIGH (ref 11.5–15.5)
WBC: 5.3 10*3/uL (ref 4.0–10.5)
nRBC: 0 % (ref 0.0–0.2)

## 2023-09-03 LAB — TSH: TSH: 1.487 u[IU]/mL (ref 0.350–4.500)

## 2023-09-03 LAB — MAGNESIUM: Magnesium: 1.5 mg/dL — ABNORMAL LOW (ref 1.7–2.4)

## 2023-09-03 LAB — TROPONIN I (HIGH SENSITIVITY): Troponin I (High Sensitivity): 165 ng/L (ref <18)

## 2023-09-03 LAB — BRAIN NATRIURETIC PEPTIDE: B Natriuretic Peptide: 449.9 pg/mL — ABNORMAL HIGH (ref 0.0–100.0)

## 2023-09-03 LAB — PROCALCITONIN: Procalcitonin: 0.99 ng/mL

## 2023-09-03 MED ORDER — ORAL CARE MOUTH RINSE: 15.0000 mL | OROMUCOSAL | Status: DC | PRN

## 2023-09-03 MED ORDER — ACETAMINOPHEN 650 MG RE SUPP: 650.0000 mg | Freq: Four times a day (QID) | RECTAL | Status: DC | PRN

## 2023-09-03 MED ORDER — TORSEMIDE 20 MG PO TABS
20.0000 mg | ORAL_TABLET | ORAL | Status: DC
  Filled 2023-09-03: qty 1

## 2023-09-03 MED ORDER — ATORVASTATIN CALCIUM 10 MG PO TABS
20.0000 mg | ORAL_TABLET | Freq: Every morning | ORAL | Status: DC
  Administered 2023-09-03 – 2023-09-06 (×4): 20 mg via ORAL
  Filled 2023-09-03 (×4): qty 2

## 2023-09-03 MED ORDER — POTASSIUM CHLORIDE CRYS ER 10 MEQ PO TBCR
10.0000 meq | EXTENDED_RELEASE_TABLET | Freq: Every day | ORAL | Status: DC
  Administered 2023-09-03: 10 meq via ORAL
  Filled 2023-09-03: qty 1

## 2023-09-03 MED ORDER — METOPROLOL TARTRATE 5 MG/5ML IV SOLN
2.5000 mg | INTRAVENOUS | Status: DC | PRN
  Administered 2023-09-03: 2.5 mg via INTRAVENOUS
  Filled 2023-09-03: qty 5

## 2023-09-03 MED ORDER — ENSURE ENLIVE PO LIQD
237.0000 mL | Freq: Two times a day (BID) | ORAL | Status: DC
  Administered 2023-09-03 – 2023-09-06 (×6): 237 mL via ORAL

## 2023-09-03 MED ORDER — ASPIRIN 81 MG PO TBEC
81.0000 mg | DELAYED_RELEASE_TABLET | Freq: Every morning | ORAL | Status: DC
  Administered 2023-09-03 – 2023-09-06 (×4): 81 mg via ORAL
  Filled 2023-09-03 (×4): qty 1

## 2023-09-03 MED ORDER — METOPROLOL TARTRATE 25 MG PO TABS
25.0000 mg | ORAL_TABLET | Freq: Four times a day (QID) | ORAL | Status: DC
  Administered 2023-09-03 (×2): 25 mg via ORAL
  Filled 2023-09-03 (×2): qty 1

## 2023-09-03 MED ORDER — ACETAMINOPHEN 325 MG PO TABS
650.0000 mg | ORAL_TABLET | Freq: Four times a day (QID) | ORAL | Status: DC | PRN
  Administered 2023-09-03 – 2023-09-05 (×5): 650 mg via ORAL
  Filled 2023-09-03 (×6): qty 2

## 2023-09-03 MED ORDER — ONDANSETRON HCL 4 MG/2ML IJ SOLN: 4.0000 mg | Freq: Four times a day (QID) | INTRAMUSCULAR | Status: DC | PRN

## 2023-09-03 MED ORDER — MELATONIN 3 MG PO TABS
3.0000 mg | ORAL_TABLET | Freq: Every evening | ORAL | Status: DC | PRN
  Administered 2023-09-03: 3 mg via ORAL
  Filled 2023-09-03 (×2): qty 1

## 2023-09-03 MED ORDER — SODIUM CHLORIDE 0.9 % IV SOLN
2.0000 g | INTRAVENOUS | Status: DC
  Administered 2023-09-03 – 2023-09-04 (×2): 2 g via INTRAVENOUS
  Filled 2023-09-03 (×2): qty 20

## 2023-09-03 MED ORDER — MAGNESIUM SULFATE 2 GM/50ML IV SOLN
2.0000 g | Freq: Once | INTRAVENOUS | Status: AC
  Administered 2023-09-03: 2 g via INTRAVENOUS
  Filled 2023-09-03: qty 50

## 2023-09-03 MED ORDER — TORSEMIDE 20 MG PO TABS
20.0000 mg | ORAL_TABLET | Freq: Every morning | ORAL | Status: DC
  Administered 2023-09-03: 20 mg via ORAL
  Filled 2023-09-03: qty 1

## 2023-09-03 NOTE — H&P (Signed)
 History and Physical      Holly Nunez FAO:[id number] DOB: 01-Feb-1925 DOA: 09/02/2023; DOS: 09/03/2023  PCP: Default, Provider, MD  Patient coming from: home   I have personally briefly reviewed patient's old medical records in [facility]  Chief Complaint: Tachycardia  HPI: Holly Nunez is a 89 y.o. female with medical history significant for paroxysmal atrial fibrillation, chronic systolic/diastolic heart failure, moderate mitral regurgitation, hyperlipidemia, anemia of chronic disease associated baseline hemoglobin range 10-12, who is admitted to [facility] on 09/02/2023 with tachycardia, felt to be multifocal atrial tachycardia versus sinus tachycardia with PACs, after presenting from outpatient heart failure clinic to [facility] ED for evaluation of tachycardia.  The patient was seen in outpatient heart failure clinic on 09/02/2023, at which time she was noted to be tachycardic, with reported heart rates at that time in the 180s to 190s.  She was noted to be mildly short of breath at that time, but otherwise without acute complaint, including no associated chest discomfort.  Denies any recent subjective fever, chills, rigors, generalized myalgias.  No recent palpitations, nausea, vomiting, diaphoresis.   She has a history of paroxysmal atrial fibrillation for which she is on metoprolol  tartrate 25 mg p.o. twice daily.  She is not formally anticoagulated given a history of fall risk as well as a history of left psoas hematoma in December 2022.  Rather, she is on daily baby aspirin .  She also has a history of chronic systolic/diastolic heart failure, and her most recent echocardiogram occurred on 08/21/2023, which was notable for LVEF 40 to 45%, severe left ventricular hypertrophy, grade 2 diastolic dysfunction, normal right ventricular systolic function, severely dilated left atrium, and moderate mitral regurgitation.  Her outpatient diuretic regimen consists of torsemide  20 mg p.o.  daily.  In the setting of her tachycardia, she was sent from heart failure clinic to the [facility] emergency department for further evaluation management thereof.    ED Course:  Vital signs in the ED were notable for the following: Afebrile; initial heart rates in the 180s to 190s, subsequent decreasing into the low 100s 130s following dose of IV Lopressor , as further described below; systolic blood pressures in the low 100s to 120s; respiratory rate 17-22, oxygen saturation 98 to 100% on room air.  Labs were notable for the following: CMP was notable for the following: Sodium 135, potassium 4.3, bicarbonate 25, creatinine 0.83 compared to 0.71 on 08/23/2023, calcium  adjusted for mild hypoalbuminemia noted to be 9.9, avidin 2.2.  Otherwise, liver enzymes were within normal limits.  High sensitive troponin I was initially 103, with repeat value trending up slightly to 113, without any prior high-sensitivity troponin I data points available for point of comparison.  BNP 419 compared to most recent prior value of 749 on 08/20/2023.  CBC notable for white blood cell count 6600, hemoglobin 11.2 CSF Neuraceq/Norocarp properties and relative demonstration prior hemoglobin data point of 11.1 on 08/23/2023, platelet count 221.  Urinalysis shows cloudy appearing specimen with greater than 50 white blood cells, large leukocyte esterase, and no evidence of squamous epithelial cells.  Urine culture collected, and COVID, influenza, RSV PCR were all negative.  Per my interpretation, EKG in ED demonstrated the following: EKG read as atrial fibrillation with heart rate 134, and no evidence of T wave or ST changes, Cleen evidence of ST elevation.  Imaging in the ED, per corresponding formal radiology read, was notable for the following: 1 view chest x-ray, in comparison to most recent prior chest x-ray  from 08/20/2023 showed interval development of left retrocardiac airspace opacity felt to represent atelectasis versus  infiltrate, while showing small bilateral pleural effusions in the absence of any evidence of pulmonary edema or pneumothorax.  EDP d/w on-call cardiology, Dr. Lavonne Prairie, who felt that patient's tachycardia was most consistent with MFAT vs sinus tach with PAC's. Dr. Lavonne Prairie recommended TRH admission and conveyed that cardiology will formally consult. He recommends increasing metoprolol  tartrate from 25 mg po bid to 25 mg po q6h, and found the patient to be relatively euvolemic, recommending continuation of existing torsemide  20 g p.o. daily. Dr. Lavonne Prairie also confirms that the pt's family does not want her to be on anticoagulation in the setting of her h/o paroxysmal atrial fibrillation.    While in the ED, the following were administered: Lopressor  5 mg IV x 1 dose.  Subsequently, the patient was admitted for further evaluation management of presenting tachycardia, felt to be less likely multifocal atrial tachycardia versus sinus tachycardia with PACs, with presenting labs notable for mildly elevated troponin as well as concern for acute cystitis.    Review of Systems: As per HPI otherwise 10 point review of systems negative.   Past Medical History:  Diagnosis Date   Anemia    Arthritis    CKD (chronic kidney disease), stage III (HCC)    3B   Coronary artery disease    Dysphagia    pureed diet   Glaucoma    Hyperlipidemia    Hypertension    OAB (overactive bladder)    PAF (paroxysmal atrial fibrillation) (HCC)    Patent foramen ovale    Psoas hematoma, left, secondary to anticoagulant therapy 05/14/2021   Thrombocytopenia (HCC)    Thyroid  nodule    Biopsy-negative   TIA (transient ischemic attack)    Urolithiasis 07/12/2023    Past Surgical History:  Procedure Laterality Date   ABDOMINAL HYSTERECTOMY     fibroid   APPENDECTOMY     CYSTOSCOPY W/ URETERAL STENT PLACEMENT Right 07/18/2023   Procedure: CYSTOSCOPY WITH RETROGRADE PYELOGRAM/URETERAL STENT PLACEMENT;  Surgeon:  Mellie Sprinkle., MD;  Location: [facility];  Service: Urology;  Laterality: Right;   CYSTOSCOPY WITH RETROGRADE PYELOGRAM, URETEROSCOPY AND STENT PLACEMENT Right 03/19/2023   Procedure: CYSTO-URETEROSCOPY; RIGHT URETERAL STENT PLACEMENT;  Surgeon: Mellie Sprinkle., MD;  Location: [facility] OR;  Service: Urology;  Laterality: Right;   EYE SURGERY Bilateral    CATARACT REMOVAL   JOINT REPLACEMENT     REPLACEMENT TOTAL KNEE Right 2007   THYROID  LOBECTOMY Left 04/25/2015   THYROID  LOBECTOMY Left 04/25/2015   Procedure: LEFT THYROID  LOBECTOMY;  Surgeon: Oralee Billow, MD;  Location: [facility] OR;  Service: General;  Laterality: Left;   TRIGGER FINGER RELEASE  2007   URETEROSCOPY WITH HOLMIUM LASER LITHOTRIPSY Right 07/18/2023   Procedure: URETEROSCOPY WITH HOLMIUM LASER LITHOTRIPSY;  Surgeon: Mellie Sprinkle., MD;  Location: [facility];  Service: Urology;  Laterality: Right;    Social History:  reports that she has never smoked. She has never used smokeless tobacco. She reports that she does not drink alcohol  and does not use drugs.   Allergies  Allergen Reactions   Alphagan  [Brimonidine ] Other (See Comments)    Secondary infection and vision changes. Allergy not listed on MAR     Family History  Problem Relation Age of Onset   Heart disease Mother    Hypertension Mother    Kidney disease Sister    Mental illness Sister        suicide, depresion  Gout Brother    Hypertension Son    Cancer Maternal Grandmother    Cancer Sister    Bone cancer Sister    Cancer Sister    Cancer Sister        kidney   Cancer Daughter    Pancreatic cancer Daughter    Thyroid  disease Daughter    Thyroid  disease Daughter        thyroid  nodules   Hypertension Daughter    Irritable bowel syndrome Daughter    Cancer Daughter        thyroid    Cancer Brother    Prostate cancer Brother     Family history reviewed and not pertinent    Prior to Admission medications   Medication Sig Start Date End Date  Taking? Authorizing Provider  acetaminophen  (TYLENOL ) 650 MG CR tablet Take 1 tablet (650 mg total) by mouth in the morning, at noon, and at bedtime. May also give 2 tablets every 6 hours as needed for pain 08/23/23  Yes Rai, Ripudeep K, MD  aspirin  EC 81 MG tablet Take 81 mg by mouth in the morning.   Yes [provider]  atorvastatin  (LIPITOR) 20 MG tablet Take 20 mg by mouth in the morning.   Yes [provider]  COMBIGAN  0.2-0.5 % ophthalmic solution Place 1 drop into both eyes in the morning and at bedtime. 03/14/23  Yes [provider]  Dextran 70-Hypromellose 0.1-0.3 % SOLN Apply 2 drops to eye every 12 (twelve) hours as needed (dry, itchy, irritated eyes).   Yes [provider]  folic acid  (FOLVITE ) 1 MG tablet Take 1 mg by mouth in the morning.   Yes [provider]  latanoprost  (XALATAN ) 0.005 % ophthalmic solution Place 1 drop into both eyes at bedtime. 03/10/23  Yes [provider]  melatonin 5 MG TABS Take 1 tablet (5 mg total) by mouth at bedtime as needed. 08/23/23  Yes Rai, Ripudeep K, MD  Menthol , Topical Analgesic, (BIOFREEZE COOL THE PAIN) 4 % GEL Apply 1 Application topically 3 (three) times daily. Apply topically to neck   Yes [provider]  metoprolol  tartrate (LOPRESSOR ) 25 MG tablet Take 1 tablet (25 mg total) by mouth 2 (two) times daily. 08/23/23  Yes Rai, Ripudeep K, MD  mirtazapine (REMERON) 7.5 MG tablet Take 7.5 mg by mouth at bedtime. 05/02/23  Yes [provider]  Multiple Vitamin (MULTIVITAMIN WITH MINERALS) TABS tablet Take 1 tablet by mouth in the morning.   Yes [provider]  nitroGLYCERIN  (NITROSTAT ) 0.4 MG SL tablet Place 1 tablet (0.4 mg total) under the tongue every 5 (five) minutes as needed for chest pain. 10/01/20 07/01/27 Yes Lenise Quince, MD  NON FORMULARY Take 120 mLs by mouth in the morning, at noon, and at bedtime. Med pass   Yes [provider]  Omega 3 1000 MG  CAPS Take 1,000 mg by mouth in the morning.   Yes [provider]  ondansetron  (ZOFRAN ) 4 MG tablet Take 4 mg by mouth every 8 (eight) hours as needed for nausea or vomiting.   Yes [provider]  polyethylene glycol (MIRALAX  / GLYCOLAX ) 17 g packet Take 17 g by mouth in the morning.   Yes [provider]  potassium chloride  (KLOR-CON ) 10 MEQ tablet Take 10 mEq by mouth at bedtime.   Yes [provider]  senna-docusate (SENOKOT-S) 8.6-50 MG tablet Take 1 tablet by mouth 2 (two) times daily. 07/20/23 10/18/23 Yes [place], Rema Care, DO  torsemide  (DEMADEX ) 20 MG tablet Take 1 tablet (20 mg total) by mouth daily. Patient taking differently: Take 20 mg by mouth in the morning. 08/24/23  Yes Rai, Hurman Maiden, MD  Trolamine Salicylate (ASPERCREME EX) Apply 1 application  topically in the morning and at bedtime. Apply to both legs   Yes [provider]  UNABLE TO FIND Take 120 mLs by mouth in the morning, at noon, and at bedtime. Med Name: [facility]   Yes [provider]     Objective    Physical Exam: Vitals:   09/02/23 1900 09/02/23 1915 09/02/23 2000 09/02/23 2300  BP: 107/83 (!) 108/58 (!) 102/55 111/63  Pulse: 67 65 61 82  Resp: (!) 22 (!) 21 (!) 22 (!) 23  Temp:      TempSrc:      SpO2: 100% 99% 99% 100%    General: appears to be stated age; alert Skin: warm, dry, no rash Head:  AT/[place] Mouth:  Oral mucosa membranes appear moist, normal dentition Neck: supple; trachea midline Heart: Tachycardic; did not appreciate any M/R/G Lungs: CTAB, did not appreciate any wheezes, rales, or rhonchi Abdomen: + BS; soft, ND, NT Vascular: 2+ pedal pulses b/l; 2+ radial pulses b/l Extremities: no peripheral edema, no muscle wasting Neuro: strength and sensation intact in upper and lower extremities b/l    Labs on Admission: I have personally reviewed following labs and imaging studies  CBC: Recent Labs  Lab 09/02/23 1353  WBC 6.6  NEUTROABS  5.3  HGB 11.2*  HCT 34.3*  MCV 97.7  PLT 221   Basic Metabolic Panel: Recent Labs  Lab 09/02/23 1228  NA 135  K 4.3  CL 100  CO2 25  GLUCOSE 118*  BUN 10  CREATININE 0.83  CALCIUM  8.6*   GFR: Estimated Creatinine Clearance: 28.5 mL/min (by C-G formula based on SCr of 0.83 mg/dL). Liver Function Tests: Recent Labs  Lab 09/02/23 1228  AST 38  ALT 16  ALKPHOS 56  BILITOT 0.9  PROT 5.3*  ALBUMIN  2.2*   Recent Labs  Lab 09/02/23 1228  LIPASE 22   No results for input(s): "AMMONIA" in the last 168 hours. Coagulation Profile: No results for input(s): "INR", "PROTIME" in the last 168 hours. Cardiac Enzymes: No results for input(s): "CKTOTAL", "CKMB", "CKMBINDEX", "TROPONINI" in the last 168 hours. BNP (last 3 results) No results for input(s): "PROBNP" in the last 8760 hours. HbA1C: No results for input(s): "HGBA1C" in the last 72 hours. CBG: No results for input(s): "GLUCAP" in the last 168 hours. Lipid Profile: No results for input(s): "CHOL", "HDL", "LDLCALC", "TRIG", "CHOLHDL", "LDLDIRECT" in the last 72 hours. Thyroid  Function Tests: No results for input(s): "TSH", "T4TOTAL", "FREET4", "T3FREE", "THYROIDAB" in the last 72 hours. Anemia Panel: No results for input(s): "VITAMINB12", "FOLATE", "FERRITIN", "TIBC", "IRON", "RETICCTPCT" in the last 72 hours. Urine analysis:    Component Value Date/Time   COLORURINE AMBER (A) 09/02/2023 1734   APPEARANCEUR CLOUDY (A) 09/02/2023 1734   LABSPEC 1.018 09/02/2023 1734   PHURINE 6.0 09/02/2023 1734   GLUCOSEU NEGATIVE 09/02/2023 1734   HGBUR NEGATIVE 09/02/2023 1734   BILIRUBINUR NEGATIVE 09/02/2023 1734   KETONESUR NEGATIVE 09/02/2023 1734   PROTEINUR 100 (A) 09/02/2023 1734   NITRITE NEGATIVE 09/02/2023 1734   LEUKOCYTESUR LARGE (A) 09/02/2023 1734    Radiological Exams on Admission: DG Chest Portable 1 View Result Date: 09/02/2023 CLINICAL DATA:  Shortness of breath EXAM: PORTABLE CHEST 1 VIEW COMPARISON:   Chest radiograph dated 08/20/2023 FINDINGS:  Normal lung volumes. Dense left retrocardiac opacity. Suspected layering bilateral pleural effusions. No pneumothorax. The heart size and mediastinal contours are within normal limits. No acute osseous abnormality. Surgical clips along the left neck. IMPRESSION: 1. Dense left retrocardiac opacity, which may represent atelectasis, aspiration, or pneumonia. 2. Suspected layering bilateral pleural effusions. Electronically Signed   By: Limin  Xu M.D.   On: 09/02/2023 14:26      Assessment/Plan   Principal Problem:   Multifocal atrial tachycardia (HCC) Active Problems:   Paroxysmal atrial fibrillation (HCC)   HLD (hyperlipidemia)   Elevated troponin   Acute cystitis   Chronic combined systolic and diastolic heart failure (HCC)   Anemia of chronic disease     #) Multifocal atrial tachycardia: The patient presented to the emergency department from heart failure clinic with tachycardia, with initial heart rates in the 180s to 190s, which, following cardiology review of EKGs and rhythm strips, was felt to be most likely representative of multifocal atrial tachycardia versus sinus tachycardia with PACs.  After receiving a dose of IV Lopressor , heart rate has improved into the low 100s to 130s, with slight improvement in systolic blood pressures into the low 100s to 1 teens.  This has been associated with improvement in the patient's previously reported mild shortness of breath.   EDP d/w on-call cardiology, Dr. Lavonne Prairie, who recommended TRH admission. Dr. Lavonne Prairie recommends rate control strategy, with gentle increase in the patient's home metoprolol  tartrate from 25 mg p.o. twice daily to metoprolol  tartrate 25 mg p.o. every 6 hours, with cardiology recommending this gentle increase given the patient's soft blood pressures.   Cardiology felt that the patient was relatively euvolemic, and did not recommend increasing current outpatient diuretic regimen at  this time.  She is at risk for ensuing development of acutely decompensated heart failure in the setting of her elevated heart rates.  Will continue to monitor volume status closely, as further detailed below.  In terms of other potential contributing factors leading to her suspected multifocal atrial tachycardia, there is the possibility of underlying infection, given her presenting urinalysis showing significant pyuria.  Will initiate Rocephin  for empiric coverage of acute cystitis, closely monitor for result of urine culture.  Additionally, presenting chest x-ray shows interval development of left retrocardiac airspace opacity, which was felt to be representative of atelectasis versus infiltrate.  Will add on procalcitonin level to further assess for potential underlying pneumonia.  If procalcitonin level is elevated, will consider adding azithromycin to existing Rocephin  for empiric coverage of community-acquired pneumonia.  ACS is felt to be less likely in the absence of any recent chest pain, while presenting EKG shows no evidence of overt acute ischemic changes, including no evidence of ST elevation.   Plan: Cardiology to formally consult, as above.  Per cardiology recommendations, will increase metoprolol  to tartrate 25 mg p.o. twice daily to 25 mg p.o. every 6 hours.  Lopressor  2.5 mg IV as needed for sustained heart rates greater than 130.  Monitor on telemetry.  Check TSH, procalcitonin level.  Start Rocephin  for potential acute cystitis, and follow-up result of urine culture, as further detailed below.  Check serum magnesium  level.  Repeat BMP in the morning.  Repeat troponin in the morning.  CMP, CBC in the morning.  Continue torsemide  10 mg p.o. daily per cardiology recommendation.                 #) Elevated troponin: mildly elevated initial troponin of 103, 3B value trending up slightly to  113. No prior high sensitivity troponin I value available for point of comparison.   Suspect that this mildly elevated troponin is on the basis of supply demand mismatch in the setting of her presenting tachycardia, resulting in diminished diastolic filling time leading to diminished coronary artery filling time , as opposed to representing a type I process due to acute plaque rupture.   EKG shows no evidence of acute ischemic changes, including no evidence of STEMI. additionally, presentation is not associated with any recent chest pain.  Overall, ACS is felt to be less likely relative to type 2 supply demand mismatch, as above, but will closely monitor on telemetry overnight while treating suspected underlying tachycardia, as above. Presentation clinically less suggestive of acute PE .   Plan: repeat troponin in the AM. Monitor on telemetry. PRN EKG for development of chest pain.  Further evaluation management of presenting suspected multifocal atrial tachycardia, as above, including increasing of metoprolol  to tartrate to 25 mg p.o. every 6 hours per recommendation of cardiology.  Continue daily baby aspirin , continue outpatient atorvastatin .  Check serum Mg level and repeat BMP in the morning.  Repeat CBC in the AM.                      #) Significant pyuria: Presenting urinalysis is associated with quantitatively significant pyuria in the setting of greater than 50 white blood cells.  In tandem with a cloudy appearing specimen and large leukocyte esterase, in the absence of any symptoms epithelial cells to suggest a contaminated specimen, there is concern for potential acute cystitis, although the patient denies any recent acute urinary symptoms.  However, in the setting of her presenting suspected multifocal atrial tachycardia, will treat these findings as potential acute cystitis for now, while monitoring for result of urine culture.  Of note, in the absence of objective fever and in the absence of leukocytosis, SIRS criteria for sepsis are not currently  met.  Plan: Follow for result of urine culture.  Start Rocephin  for 3 to 5-day course for coverage of potential acute cystitis.  Repeat CBC in the morning.                    #) Paroxysmal atrial fibrillation: Documented history of such. In setting of CHA2DS2-VASc score of  4, there is an indication for chronic anticoagulation for thromboembolic prophylaxis.  However, in the setting of the patient's history of fall risk as well as history of left psoas hematoma in December 2022, the patient's family has confirmed to not be anticoagulated .  Dr.Hochrein or cardiology confirmed again today that patient's family does not want the patient to be formally anticoagulated.  Rather, she is on a daily baby aspirin . Home AV nodal blocking regimen: Metoprolol  tartrate 25 mg p.o. twice daily, which cardiology recommends increasing the frequency thereof to every 6 hours in the context of presenting suspected multifocal atrial tachycardia, as above.  Most recent echocardiogram was performed on 08/21/2023 notable for LVEF 40 to 45%, grade 2 diastolic dysfunction, severely dilated the left atrium, predisposing to recurrent atrial fibrillation, along with moderate mitral regurgitation.  Per today's cardiology consult, per cardiology's review of rhythm strips, her presenting tachycardia appears most consistent with multifocal atrial tachycardia versus sinus tachycardia with PACs, as further detailed above.   Plan: monitor strict I's & O's and daily weights. CMP/CBC in AM. Check serum mag level.  Increase home metoprolol  tartrate from 25 mg p.o. twice daily to 25 mg p.o.  every 6 hours per cardiology recommendation.  Refraining from formal anticoagulation per family's request, as above.  Monitor on telemetry.                     #) Chronic combined systolic and diastolic heart failure: documented history of such, with most recent echocardiogram performed on 08/21/2023, notable for LVEF 40 to 45%,  grade 2 diastolic dysfunction with additional results as conveyed above.  Presenting BNP has declined to the interval since most recent prior value on 08/20/2023, as further detailed above.  No evidence of pulmonary edema on presenting chest x-ray.  Per formal cardiology consult, cardiology feels that the patient is relatively euvolemic, and recommends continuation of existing torsemide  20 mg p.o. daily.  Patient also noted to be on potassium chloride  10 mill equivalents p.o. daily.  Plan: monitor strict I's & O's and daily weights. Repeat BMP in the morning. Check serum magnesium  level. Continue home diuretic regimen. Continue home potassium chloride  10 mill colons p.o. daily.  Repeat BNP in the morning.                   #) Hyperlipidemia: documented h/o such. On atorvastatin  as outpatient.   Plan: continue home statin.                     #) Anemia of chronic disease: Documented history of such, a/w with baseline hgb range 10-12, with presenting hgb consistent with this range, in the absence of any overt evidence of active bleed.     Plan: Repeat CBC in the morning.     DVT prophylaxis: SCD's   Code Status: DNR/DNI, consistent with code status documentation from most recent prior hospitalization Family Communication: I discussed the patient's case with her daughter, who was present at bedside;  Disposition Plan: Per Rounding Team Consults called: EDP d/w on-call cardiology, Dr. Lavonne Prairie, who recommended TRH admission and conveyed that cardiology will formally consult. ;  Admission status: Inpatient     I SPENT GREATER THAN 75  MINUTES IN CLINICAL CARE TIME/MEDICAL DECISION-MAKING IN COMPLETING THIS ADMISSION.      Esme Freund B Saudia Smyser DO [facility]  From 7PM - 7AM   09/03/2023, 1:00 AM

## 2023-09-03 NOTE — Progress Notes (Signed)
 Progress Note  Patient Name: Holly Nunez Date of Encounter: 09/03/2023  Primary Cardiologist: Alexandria Angel, MD   Subjective   Patient seen and examined at her bedside. Family members at the bedside. She offers no complaints at this time.   Inpatient Medications    Scheduled Meds:  aspirin  EC  81 mg Oral q AM   atorvastatin   20 mg Oral q AM   feeding supplement  237 mL Oral BID BM   metoprolol  tartrate  25 mg Oral Q6H   potassium chloride   10 mEq Oral QHS   torsemide   20 mg Oral q AM   Continuous Infusions:  cefTRIAXone  (ROCEPHIN )  IV Stopped (09/03/23 0322)   PRN Meds: acetaminophen  **OR** acetaminophen , melatonin, metoprolol  tartrate, ondansetron  (ZOFRAN ) IV, mouth rinse   Vital Signs    Vitals:   09/03/23 0339 09/03/23 0509 09/03/23 0640 09/03/23 0848  BP: 127/73 100/71 109/62 (!) 87/56  Pulse: (!) 140 67 (!) 134 (!) 129  Resp: 18   18  Temp: 98.9 F (37.2 C)   97.7 F (36.5 C)  TempSrc: Oral   Oral  SpO2: 99% 100%  100%  Weight: 51.7 kg     Height: 5' (1.524 m)       Intake/Output Summary (Last 24 hours) at 09/03/2023 1036 Last data filed at 09/03/2023 0900 Gross per 24 hour  Intake 220 ml  Output --  Net 220 ml   Filed Weights   09/03/23 0339  Weight: 51.7 kg    Telemetry    Afib with rvr  - Personally Reviewed  ECG     - Personally Reviewed  Physical Exam    General: Comfortable Head: Atraumatic, normal size, poor dentition Neck: Supple, normal JVD Cardiac: Normal S1, S2; RRR; no murmurs, rubs, or gallops Lungs: Clear to auscultation bilaterally Abd: Soft, nontender, no hepatomegaly  Ext: warm, no edema Skin: Warm and dry, no rashes      Labs    Chemistry Recent Labs  Lab 09/02/23 1228 09/03/23 0507  NA 135 137  K 4.3 4.3  CL 100 99  CO2 25 26  GLUCOSE 118* 95  BUN 10 11  CREATININE 0.83 0.84  CALCIUM  8.6* 8.7*  PROT 5.3* 5.1*  ALBUMIN  2.2* 2.1*  AST 38 25  ALT 16 12  ALKPHOS 56 52  BILITOT 0.9 0.7  GFRNONAA  >60 >60  ANIONGAP 10 12     Hematology Recent Labs  Lab 09/02/23 1353 09/03/23 0507  WBC 6.6 5.3  RBC 3.51* 3.20*  HGB 11.2* 10.2*  HCT 34.3* 30.6*  MCV 97.7 95.6  MCH 31.9 31.9  MCHC 32.7 33.3  RDW 18.1* 18.0*  PLT 221 214    Cardiac EnzymesNo results for input(s): "TROPONINI" in the last 168 hours. No results for input(s): "TROPIPOC" in the last 168 hours.   BNP Recent Labs  Lab 09/02/23 1353 09/03/23 0505  BNP 419.6* 449.9*     DDimer No results for input(s): "DDIMER" in the last 168 hours.   Radiology    DG Chest Portable 1 View Result Date: 09/02/2023 CLINICAL DATA:  Shortness of breath EXAM: PORTABLE CHEST 1 VIEW COMPARISON:  Chest radiograph dated 08/20/2023 FINDINGS: Normal lung volumes. Dense left retrocardiac opacity. Suspected layering bilateral pleural effusions. No pneumothorax. The heart size and mediastinal contours are within normal limits. No acute osseous abnormality. Surgical clips along the left neck. IMPRESSION: 1. Dense left retrocardiac opacity, which may represent atelectasis, aspiration, or pneumonia. 2. Suspected layering bilateral pleural effusions. Electronically  Signed   By: Limin  Xu M.D.   On: 09/02/2023 14:26    Cardiac Studies   Echo   Patient Profile     89 y.o. female with hx of PAF, chronic systolic heart failure, TIA, hypertension, hyperlipidemia, PFO.   Assessment & Plan    PAF ( noted episodes of MAT) Chronic combined heart failure  Moderate MR Hypertension  Clinically she does not appear to be volume overloaded.  Will recommend transitioning her torsemide  to every other day. In terms of her atrial arrhythmia previous decisions has been made and patient was staying not a candidate for anticoagulation due to bleeding. Blood pressure is on the lower side today.  At this time we will sign off.  [facility] will sign off.   Medication Recommendations: torsemide  20 mg every other day, continue Lopressor  at current  dose Other recommendations (labs, testing, etc): None Follow up as an outpatient: Routine follow-up      For questions or updates, please contact [facility] Please consult [email or web address] for contact info under Cardiology/STEMI.      Signed, Melika Reder, DO  09/03/2023, 10:36 AM

## 2023-09-03 NOTE — Progress Notes (Signed)
 MEWS Progress Note  Patient Details Name: Holly Nunez MRN: [id number] DOB: July 07, 1924 Today's Date: 09/03/2023   MEWS Flowsheet Documentation:  Assess: MEWS Score Temp: 97.7 F (36.5 C) BP: (!) 87/56 MAP (mmHg): 67 Pulse Rate: (!) 129 ECG Heart Rate: (!) 120 Resp: 18 Level of Consciousness: Alert SpO2: 100 % O2 Device: Room Air Assess: MEWS Score MEWS Temp: 0 MEWS Systolic: 1 MEWS Pulse: 2 MEWS RR: 0 MEWS LOC: 0 MEWS Score: 3 MEWS Score Color: Yellow Assess: SIRS CRITERIA SIRS Temperature : 0 SIRS Respirations : 0 SIRS Pulse: 1 SIRS WBC: 0 SIRS Score Sum : 1 Assess: if the MEWS score is Yellow or Red Were vital signs accurate and taken at a resting state?: Yes Does the patient meet 2 or more of the SIRS criteria?: No MEWS guidelines implemented : Yes, yellow Treat MEWS Interventions: Considered administering scheduled or prn medications/treatments as ordered Take Vital Signs Increase Vital Sign Frequency : Yellow: Q2hr x1, continue Q4hrs until patient remains green for 12hrs Escalate MEWS: Escalate: Yellow: Discuss with charge nurse and consider notifying provider and/or RRT Notify: Charge Nurse/RN Name of Charge Nurse/RN Notified: Scheryl Cushing Provider Notification Provider Name/Title: Dr. Mae Schlossman Date Provider Notified: 09/03/23 Time Provider Notified: [id number] Method of Notification: Face-to-face Notification Reason: Change in status (BP soft) Provider response: At bedside Date of Provider Response: 09/03/23 Time of Provider Response: [id number]      Harvis Ling 09/03/2023, 9:04 AM

## 2023-09-03 NOTE — Plan of Care (Signed)
   Problem: Education: Goal: Knowledge of General Education information will improve Description Including pain rating scale, medication(s)/side effects and non-pharmacologic comfort measures Outcome: Progressing   Problem: Health Behavior/Discharge Planning: Goal: Ability to manage health-related needs will improve Outcome: Progressing

## 2023-09-03 NOTE — Progress Notes (Signed)
 PROGRESS NOTE  Holly Nunez:[id number] DOB: [birth year]   PCP: Default, Provider, MD  Patient is from: SNF.  Per daughter, basically bedbound since she went to nursing home.  DOA: 09/02/2023 LOS: 0  Chief complaints Chief Complaint  Patient presents with   A-fib w/RVR     Brief Narrative / Interim history: 89 year old F with PMH of combined CHF, moderate MR, PAF not on anticoagulation due to fall risk, anemia, HLD and ambulatory dysfunction sent to ED from heart failure clinic due to tachycardia with HR in 180s to 190s, and admitted with working diagnosis of multifocal atrial tachycardia.  Troponin was slightly elevated to 101 and 103.  BNP was 420 with seem to be lower than prior value.  UA with pyuria but patient without UTI symptoms.  RVP negative.  CXR suggested left retrocardiac opacity with small bilateral effusion.  Urine culture obtained.  Started on IV ceftriaxone  for possible UTI and pneumonia.  Cardiology consulted and recommended metoprolol  to every 6 hours.   The next day, remained tachycardic with HR 120s.  Soft blood pressure.  Cardiology gave recommendation and signed off.  Continued on IV ceftriaxone  for possible UTI and pneumonia.  Subjective: Seen and examined earlier this morning.  No major events overnight of this morning.  Tachycardic with HR to 120s.  Soft blood pressure.  Feels tired but denies dizziness, chest pain, shortness of breath or palpitation.  Denies GI or UTI symptoms.   She is awake and alert.  She is oriented to self, person, place and her date of birth but not a great historian.  Patient's daughter at bedside.  Objective: Vitals:   09/03/23 0509 09/03/23 0640 09/03/23 0848 09/03/23 1122  BP: 100/71 109/62 (!) 87/56 101/76  Pulse: 67 (!) 134 (!) 129 (!) 122  Resp:   18 18  Temp:   97.7 F (36.5 C) 97.8 F (36.6 C)  TempSrc:   Oral Axillary  SpO2: 100%  100% 98%  Weight:      Height:        Examination:  GENERAL: No apparent distress.   Nontoxic. HEENT: MMM.  Vision and hearing grossly intact.  NECK: Supple.  No apparent JVD.  RESP:  No IWOB.  Fair aeration bilaterally. CVS:  RRR. Heart sounds normal.  ABD/GI/GU: BS+. Abd soft, NTND.  MSK/EXT:  Moves extremities. No apparent deformity. No edema.  SKIN: no apparent skin lesion or wound NEURO: Awake, alert and oriented appropriately.  No apparent focal neuro deficit. PSYCH: Calm. Normal affect.   Consultants:  Cardiology  Procedures: None  Microbiology summarized: COVID-19, influenza and RSV PCR nonreactive Urine culture pending  Assessment and plan: Multifocal atrial tachycardia/paroxysmal A-fib-sent to ED from heart failure clinic with heart rate of 180-190s.  Unclear etiology.  Doubt PE without respiratory distress or symptoms.  Mildly elevated troponin likely demand ischemia.  BNP better than baseline.  UA with pyuria.  CXR raises concern for left retrocardiac opacity.  TSH normal. -Appreciate recommendation by cardiology -Continue metoprolol  25 mg every 6 hours -Change torsemide  to every other day -Not a candidate for anticoagulation due to bleeding -Continue ceftriaxone  for possible UTI and pneumonia -Optimize electrolytes  Chronic combined CHF: TTE on 4/6 with LVEF of 40 to 45%, G2 DD, severe LVH, severe LAE, moderate MR.  On torsemide  20 mg daily at home.  Appears euvolemic on exam.  So BP. -Cardiology recommended decreasing torsemide  to every other day -Strict intake and output, daily weight, renal functions and electrolytes  Community-acquired pneumonia: Chest  x-ray suggested retrocardiac opacity.  Has no fever or leukocytosis but mildly elevated Pro-Cal to 0.99 - Continue IV ceftriaxone   Pyuria: UA with pyuria.  Denies UTI symptoms but not reliable historian - Should be covered on ceftriaxone  - Follow urine culture  Elevated troponin/demand ischemia: Likely demand ischemia in the setting of #1 -Management as above  Hypotension: In the setting of  tachycardia increased dose of metoprolol  -Cardiology suggested decreasing torsemide  to every other day -Continue monitoring  Moderate mitral valve regurgitation-noted on recent TTE.  Anemia of chronic disease: Stable -Continue monitoring  Ambulatory dysfunction: Lately bedbound.  Per daughter, was able to ambulate with rolling walker before she went to SNF. - PT/OT - Palliative consult  Hypomagnesemia - Monitor replenish as appropriate  Body mass index is 22.26 kg/m.        Pressure skin injury: Present on arrival Pressure Injury 09/03/23 Sacrum Right;Left;Mid Stage 2 -  Partial thickness loss of dermis presenting as a shallow open injury with a red, pink wound bed without slough. (Active)  09/03/23 0730  Location: Sacrum  Location Orientation: Right;Left;Mid  Staging: Stage 2 -  Partial thickness loss of dermis presenting as a shallow open injury with a red, pink wound bed without slough.  Wound Description (Comments):   Present on Admission: Yes  Dressing Type Foam - Lift dressing to assess site every shift 09/03/23 0730   DVT prophylaxis:  SCDs Start: 09/03/23 0052  Code Status: DNR-Limited Family Communication: Updated patient's daughter at bedside Level of care: Progressive Status is: Inpatient Remains inpatient appropriate because: Tachycardia, hypotension, pneumonia and possible UTI   Final disposition: Likely back to SNF   55 minutes with more than 50% spent in reviewing records, counseling patient/family and coordinating care.   Sch Meds:  Scheduled Meds:  aspirin  EC  81 mg Oral q AM   atorvastatin   20 mg Oral q AM   feeding supplement  237 mL Oral BID BM   metoprolol  tartrate  25 mg Oral Q6H   potassium chloride   10 mEq Oral QHS   [START ON 09/05/2023] torsemide   20 mg Oral QODAY   Continuous Infusions:  cefTRIAXone  (ROCEPHIN )  IV Stopped (09/03/23 0322)   PRN Meds:.acetaminophen  **OR** acetaminophen , melatonin, metoprolol  tartrate, ondansetron   (ZOFRAN ) IV, mouth rinse  Antimicrobials: Anti-infectives (From admission, onward)    Start     Dose/Rate Route Frequency Ordered Stop   09/03/23 0200  cefTRIAXone  (ROCEPHIN ) 2 g in sodium chloride  0.9 % 100 mL IVPB        2 g 200 mL/hr over 30 Minutes Intravenous Every 24 hours 09/03/23 0125          I have personally reviewed the following labs and images: CBC: Recent Labs  Lab 09/02/23 1353 09/03/23 0507  WBC 6.6 5.3  NEUTROABS 5.3 3.7  HGB 11.2* 10.2*  HCT 34.3* 30.6*  MCV 97.7 95.6  PLT 221 214   BMP &GFR Recent Labs  Lab 09/02/23 1228 09/03/23 0507  NA 135 137  K 4.3 4.3  CL 100 99  CO2 25 26  GLUCOSE 118* 95  BUN 10 11  CREATININE 0.83 0.84  CALCIUM  8.6* 8.7*  MG  --  1.5*   Estimated Creatinine Clearance: 28.1 mL/min (by C-G formula based on SCr of 0.84 mg/dL). Liver & Pancreas: Recent Labs  Lab 09/02/23 1228 09/03/23 0507  AST 38 25  ALT 16 12  ALKPHOS 56 52  BILITOT 0.9 0.7  PROT 5.3* 5.1*  ALBUMIN  2.2* 2.1*   Recent  Labs  Lab 09/02/23 1228  LIPASE 22   No results for input(s): "AMMONIA" in the last 168 hours. Diabetic: No results for input(s): "HGBA1C" in the last 72 hours. No results for input(s): "GLUCAP" in the last 168 hours. Cardiac Enzymes: No results for input(s): "CKTOTAL", "CKMB", "CKMBINDEX", "TROPONINI" in the last 168 hours. No results for input(s): "PROBNP" in the last 8760 hours. Coagulation Profile: No results for input(s): "INR", "PROTIME" in the last 168 hours. Thyroid  Function Tests: Recent Labs    09/03/23 0505  TSH 1.487   Lipid Profile: No results for input(s): "CHOL", "HDL", "LDLCALC", "TRIG", "CHOLHDL", "LDLDIRECT" in the last 72 hours. Anemia Panel: No results for input(s): "VITAMINB12", "FOLATE", "FERRITIN", "TIBC", "IRON", "RETICCTPCT" in the last 72 hours. Urine analysis:    Component Value Date/Time   COLORURINE AMBER (A) 09/02/2023 1734   APPEARANCEUR CLOUDY (A) 09/02/2023 1734   LABSPEC 1.018  09/02/2023 1734   PHURINE 6.0 09/02/2023 1734   GLUCOSEU NEGATIVE 09/02/2023 1734   HGBUR NEGATIVE 09/02/2023 1734   BILIRUBINUR NEGATIVE 09/02/2023 1734   KETONESUR NEGATIVE 09/02/2023 1734   PROTEINUR 100 (A) 09/02/2023 1734   NITRITE NEGATIVE 09/02/2023 1734   LEUKOCYTESUR LARGE (A) 09/02/2023 1734   Sepsis Labs: Invalid input(s): "PROCALCITONIN", "LACTICIDVEN"  Microbiology: Recent Results (from the past 240 hours)  Resp panel by RT-PCR (RSV, Flu A&B, Covid) Anterior Nasal Swab     Status: None   Collection Time: 09/02/23 12:28 PM   Specimen: Anterior Nasal Swab  Result Value Ref Range Status   SARS Coronavirus 2 by RT PCR NEGATIVE NEGATIVE Final   Influenza A by PCR NEGATIVE NEGATIVE Final   Influenza B by PCR NEGATIVE NEGATIVE Final    Comment: (NOTE) The Xpert Xpress SARS-CoV-2/FLU/RSV plus assay is intended as an aid in the diagnosis of influenza from Nasopharyngeal swab specimens and should not be used as a sole basis for treatment. Nasal washings and aspirates are unacceptable for Xpert Xpress SARS-CoV-2/FLU/RSV testing.  Fact Sheet for Patients: BloggerCourse.com  Fact Sheet for Healthcare Providers: SeriousBroker.it  This test is not yet approved or cleared by the [place]  FDA and has been authorized for detection and/or diagnosis of SARS-CoV-2 by FDA under an Emergency Use Authorization (EUA). This EUA will remain in effect (meaning this test can be used) for the duration of the COVID-19 declaration under Section 564(b)(1) of the Act, 21 U.S.C. section 360bbb-3(b)(1), unless the authorization is terminated or revoked.     Resp Syncytial Virus by PCR NEGATIVE NEGATIVE Final    Comment: (NOTE) Fact Sheet for Patients: BloggerCourse.com  Fact Sheet for Healthcare Providers: SeriousBroker.it  This test is not yet approved or cleared by the [place]   FDA and has been authorized for detection and/or diagnosis of SARS-CoV-2 by FDA under an Emergency Use Authorization (EUA). This EUA will remain in effect (meaning this test can be used) for the duration of the COVID-19 declaration under Section 564(b)(1) of the Act, 21 U.S.C. section 360bbb-3(b)(1), unless the authorization is terminated or revoked.  Performed at [facility], [address], [place], [place] [address]     Radiology Studies: No results found.     Shylo Dillenbeck T. Jaquavius Hudler [facility]  If 7PM-7AM, please contact night-coverage [email or web address] 09/03/2023, 12:22 PM

## 2023-09-03 NOTE — Progress Notes (Signed)
 Patient with soft blood pressures during this shift. Patient remains awake and alert and asymptomatic. BP was 101/76 prior to 1200 dose of metoprolol , and then dropped to 82/62 one hour later. BP currently 91/62. RN requested hold parameters to be placed on PO metoprolol . Dr. Emmette Harms placed hold parameters; see MAR. Dr. Gonfa also aware.

## 2023-09-04 ENCOUNTER — Other Ambulatory Visit: Payer: Self-pay

## 2023-09-04 DIAGNOSIS — N3000 Acute cystitis without hematuria: Secondary | ICD-10-CM | POA: Diagnosis not present

## 2023-09-04 DIAGNOSIS — D638 Anemia in other chronic diseases classified elsewhere: Secondary | ICD-10-CM | POA: Diagnosis not present

## 2023-09-04 DIAGNOSIS — I4719 Other supraventricular tachycardia: Secondary | ICD-10-CM | POA: Diagnosis not present

## 2023-09-04 DIAGNOSIS — R7989 Other specified abnormal findings of blood chemistry: Secondary | ICD-10-CM | POA: Diagnosis not present

## 2023-09-04 LAB — RENAL FUNCTION PANEL
Albumin: 1.9 g/dL — ABNORMAL LOW (ref 3.5–5.0)
Anion gap: 10 (ref 5–15)
BUN: 12 mg/dL (ref 8–23)
CO2: 25 mmol/L (ref 22–32)
Calcium: 8.5 mg/dL — ABNORMAL LOW (ref 8.9–10.3)
Chloride: 103 mmol/L (ref 98–111)
Creatinine, Ser: 0.78 mg/dL (ref 0.44–1.00)
GFR, Estimated: >60 mL/min (ref >60)
Glucose, Bld: 110 mg/dL — ABNORMAL HIGH (ref 70–99)
Phosphorus: 2.7 mg/dL (ref 2.5–4.6)
Potassium: 3.6 mmol/L (ref 3.5–5.1)
Sodium: 138 mmol/L (ref 135–145)

## 2023-09-04 LAB — CBC
HCT: 27.5 % — ABNORMAL LOW (ref 36.0–46.0)
Hemoglobin: 9.3 g/dL — ABNORMAL LOW (ref 12.0–15.0)
MCH: 32.3 pg (ref 26.0–34.0)
MCHC: 33.8 g/dL (ref 30.0–36.0)
MCV: 95.5 fL (ref 80.0–100.0)
Platelets: 179 10*3/uL (ref 150–400)
RBC: 2.88 MIL/uL — ABNORMAL LOW (ref 3.87–5.11)
RDW: 18.4 % — ABNORMAL HIGH (ref 11.5–15.5)
WBC: 5.2 10*3/uL (ref 4.0–10.5)
nRBC: 0 % (ref 0.0–0.2)

## 2023-09-04 LAB — MAGNESIUM: Magnesium: 1.9 mg/dL (ref 1.7–2.4)

## 2023-09-04 LAB — PROCALCITONIN: Procalcitonin: 0.53 ng/mL

## 2023-09-04 MED ORDER — METOPROLOL TARTRATE 12.5 MG HALF TABLET
12.5000 mg | ORAL_TABLET | Freq: Four times a day (QID) | ORAL | Status: DC
  Administered 2023-09-04 – 2023-09-05 (×3): 12.5 mg via ORAL
  Filled 2023-09-04 (×3): qty 1

## 2023-09-04 MED ORDER — SENNOSIDES-DOCUSATE SODIUM 8.6-50 MG PO TABS
1.0000 | ORAL_TABLET | Freq: Once | ORAL | Status: AC
  Administered 2023-09-04: 1 via ORAL
  Filled 2023-09-04: qty 1

## 2023-09-04 MED ORDER — POTASSIUM CHLORIDE 20 MEQ PO PACK
40.0000 meq | PACK | Freq: Once | ORAL | Status: AC
  Administered 2023-09-04: 40 meq via ORAL
  Filled 2023-09-04: qty 2

## 2023-09-04 MED ORDER — POLYETHYLENE GLYCOL 3350 17 G PO PACK: 17.0000 g | PACK | Freq: Two times a day (BID) | ORAL | Status: DC | PRN

## 2023-09-04 MED ORDER — METOPROLOL TARTRATE 12.5 MG HALF TABLET: 12.5000 mg | ORAL_TABLET | Freq: Four times a day (QID) | ORAL | Status: DC

## 2023-09-04 MED ORDER — SENNOSIDES-DOCUSATE SODIUM 8.6-50 MG PO TABS: 1.0000 | ORAL_TABLET | Freq: Two times a day (BID) | ORAL | Status: DC | PRN

## 2023-09-04 MED ORDER — MIDODRINE HCL 5 MG PO TABS
5.0000 mg | ORAL_TABLET | Freq: Two times a day (BID) | ORAL | Status: DC
  Administered 2023-09-04 – 2023-09-05 (×3): 5 mg via ORAL
  Filled 2023-09-04 (×3): qty 1

## 2023-09-04 MED ORDER — AMOXICILLIN 500 MG PO CAPS
500.0000 mg | ORAL_CAPSULE | Freq: Two times a day (BID) | ORAL | Status: DC
  Administered 2023-09-04 – 2023-09-06 (×4): 500 mg via ORAL
  Filled 2023-09-04 (×5): qty 1

## 2023-09-04 MED ORDER — POLYETHYLENE GLYCOL 3350 17 G PO PACK
17.0000 g | PACK | Freq: Once | ORAL | Status: AC
  Administered 2023-09-04: 17 g via ORAL
  Filled 2023-09-04: qty 1

## 2023-09-04 NOTE — Progress Notes (Signed)
 OT Cancellation Note  Patient Details Name: Holly Nunez MRN: [id number] DOB: [birth year]   Cancelled Treatment:    Reason Eval/Treat Not Completed: Medical issues which prohibited therapy.  Spoke with MD exiting room, advised to hold today due to BP and HR.  Okay to wait until tomorrow.    Octavia Belton Blong Busk 09/04/2023, 9:38 AM 09/04/2023  RP, OTR/L  Acute Rehabilitation Services  Office:  [phone]

## 2023-09-04 NOTE — Progress Notes (Signed)
 PROGRESS NOTE  Melysa Schroyer ONG:[id number] DOB: 1925/02/21   PCP: Default, Provider, MD  Patient is from: SNF.  Per daughter, basically bedbound since she went to nursing home.  DOA: 09/02/2023 LOS: 1  Chief complaints Chief Complaint  Patient presents with   A-fib w/RVR     Brief Narrative / Interim history: 89 year old F with PMH of combined CHF, moderate MR, PAF not on anticoagulation due to fall risk, anemia, HLD and ambulatory dysfunction sent to ED from heart failure clinic due to tachycardia with HR in 180s to 190s, and admitted with working diagnosis of multifocal atrial tachycardia.  Troponin was slightly elevated to 101 and 103.  BNP was 420 with seem to be lower than prior value.  UA with pyuria but patient without UTI symptoms.  RVP negative.  CXR suggested left retrocardiac opacity with small bilateral effusion.  Urine culture obtained.  Started on IV ceftriaxone  for possible UTI and pneumonia.  Cardiology consulted and recommended metoprolol  to every 6 hours.   The next day, remained tachycardic with HR 120s.  Soft blood pressure.  Cardiology gave recommendation and signed off.  However, she remains tachycardic with soft blood pressure.  Adjusted beta-blocker and added low-dose midodrine .   Continued on IV ceftriaxone  for possible UTI and pneumonia.  Urine culture with Enterococcus faecalis.  Antibiotic changed to amoxicillin   Subjective: Seen and examined earlier this morning.  No major events overnight of this morning.  No complaints.  Remains tachycardic with heart rate ranging from 110s to 130s.  Soft blood pressures.  She did not receive metoprolol  since 11 AM yesterday due to hypotension.  Objective: Vitals:   09/03/23 1513 09/03/23 1808 09/03/23 2030 09/04/23 0529  BP: 91/62 105/76 (!) 86/53 (!) 98/48  Pulse: 100 (!) 124 (!) 116 84  Resp: 18  16 16   Temp: (!) 97.5 F (36.4 C)  97.7 F (36.5 C) 97.8 F (36.6 C)  TempSrc: Axillary  Oral Oral  SpO2: 100% 100%  99% 97%  Weight:    53.6 kg  Height:        Examination:  GENERAL: No apparent distress.  Appears frail. HEENT: MMM.  Vision and hearing grossly intact.  NECK: Supple.  No apparent JVD.  RESP:  No IWOB.  Fair aeration bilaterally. CVS: Irregular rhythm.  Tachycardic to 130s.  Heart sounds normal.  ABD/GI/GU: BS+. Abd soft, NTND.  MSK/EXT:  Moves extremities.  Bilateral lower extremity weakness.  No edema. SKIN: no apparent skin lesion or wound NEURO: Awake, alert and oriented appropriately.  No apparent focal neuro deficit. PSYCH: Calm. Normal affect.   Consultants:  Cardiology  Procedures: None  Microbiology summarized: COVID-19, influenza and RSV PCR nonreactive Urine culture with Enterococcus faecalis.  Assessment and plan: Multifocal atrial tachycardia/paroxysmal A-fib-sent to ED from heart failure clinic with heart rate of 180-190s.  Unclear etiology.  Doubt PE without respiratory distress or symptoms.  Mildly elevated troponin likely demand ischemia.  BNP better than baseline.  UA with pyuria.  CXR raises concern for left retrocardiac opacity.  TSH normal. -Cardiology gave recommendation and signed off -Did not receive metoprolol  since 11 AM yesterday due to soft BP on holding parameters -Decrease metoprolol  to 12.5 mg every 6 hours.  Adjusted holding parameters -Start low-dose midodrine  -Treat UTI and possible pneumonia as below -Optimize electrolytes  Chronic combined CHF: TTE on 4/6 with LVEF of 40 to 45%, G2 DD, severe LVH, severe LAE, moderate MR.  On torsemide  20 mg daily at home.  Appears euvolemic on  exam.  Soft BP. -Cardiology recommended decreasing torsemide  to every other day -Strict intake and output, daily weight, renal functions and electrolytes  Community-acquired pneumonia: Chest x-ray suggested retrocardiac opacity.  Has no fever or leukocytosis but mildly elevated Pro-Cal to 0.99 - Continue IV ceftriaxone >> p.o. amoxicillin   Urinary tract  infection:: UA with pyuria.  Urine culture with Enterococcus faecalis..  Denies UTI symptoms but not reliable historian - Ceftriaxone >> p.o. amoxicillin   Elevated troponin/demand ischemia: Likely demand ischemia in the setting of #1 -Management as above  Hypotension: In the setting of tachycardia increased dose of metoprolol  -Cardiology suggested decreasing torsemide  to every other day - Adjusted metoprolol  and started midodrine  as above  Moderate mitral valve regurgitation-noted on recent TTE.  Anemia of chronic disease: Stable -Continue monitoring  Ambulatory dysfunction: Lately bedbound.  Per daughter, was able to ambulate with rolling walker before she went to SNF. - PT/OT  Goal of care discussion: DNR limited appropriate.  Patient is frail with advanced age and significant comorbidity as above.  Consulted palliative care after discussion with patient's daughters  Hypomagnesemia -Monitor replenish as appropriate  Constipation - Bowel regimen  Body mass index is 23.08 kg/m.        Pressure skin injury: Present on arrival Pressure Injury 09/03/23 Sacrum Right;Left;Mid Stage 2 -  Partial thickness loss of dermis presenting as a shallow open injury with a red, pink wound bed without slough. (Active)  09/03/23 0730  Location: Sacrum  Location Orientation: Right;Left;Mid  Staging: Stage 2 -  Partial thickness loss of dermis presenting as a shallow open injury with a red, pink wound bed without slough.  Wound Description (Comments):   Present on Admission: Yes  Dressing Type Foam - Lift dressing to assess site every shift 09/03/23 0730   DVT prophylaxis:  SCDs Start: 09/03/23 0052  Code Status: DNR-Limited Family Communication: Updated daughters, Shelanda at bedside and [name] over the phone Level of care: Telemetry Cardiac Status is: Inpatient Remains inpatient appropriate because: Tachycardia, hypotension, pneumonia and possible UTI   Final disposition: Likely back to  SNF   55 minutes with more than 50% spent in reviewing records, counseling patient/family and coordinating care.   Sch Meds:  Scheduled Meds:  amoxicillin   500 mg Oral Q12H   aspirin  EC  81 mg Oral q AM   atorvastatin   20 mg Oral q AM   feeding supplement  237 mL Oral BID BM   metoprolol  tartrate  12.5 mg Oral Q6H   midodrine   5 mg Oral BID WC   potassium chloride   40 mEq Oral Once   [START ON 09/05/2023] torsemide   20 mg Oral QODAY   Continuous Infusions:   PRN Meds:.acetaminophen  **OR** acetaminophen , melatonin, metoprolol  tartrate, ondansetron  (ZOFRAN ) IV, mouth rinse, [COMPLETED] polyethylene glycol **FOLLOWED BY** polyethylene glycol, [COMPLETED] senna-docusate **FOLLOWED BY** senna-docusate  Antimicrobials: Anti-infectives (From admission, onward)    Start     Dose/Rate Route Frequency Ordered Stop   09/04/23 1745  amoxicillin  (AMOXIL ) capsule 500 mg        500 mg Oral Every 12 hours 09/04/23 1658     09/03/23 0200  cefTRIAXone  (ROCEPHIN ) 2 g in sodium chloride  0.9 % 100 mL IVPB  Status:  Discontinued        2 g 200 mL/hr over 30 Minutes Intravenous Every 24 hours 09/03/23 0125 09/04/23 1658        I have personally reviewed the following labs and images: CBC: Recent Labs  Lab 09/02/23 1353 09/03/23 0507 09/04/23 0549  WBC  6.6 5.3 5.2  NEUTROABS 5.3 3.7  --   HGB 11.2* 10.2* 9.3*  HCT 34.3* 30.6* 27.5*  MCV 97.7 95.6 95.5  PLT 221 214 179   BMP &GFR Recent Labs  Lab 09/02/23 1228 09/03/23 0507 09/04/23 0549  NA 135 137 138  K 4.3 4.3 3.6  CL 100 99 103  CO2 25 26 25   GLUCOSE 118* 95 110*  BUN 10 11 12   CREATININE 0.83 0.84 0.78  CALCIUM  8.6* 8.7* 8.5*  MG  --  1.5* 1.9  PHOS  --   --  2.7   Estimated Creatinine Clearance: 29.5 mL/min (by C-G formula based on SCr of 0.78 mg/dL). Liver & Pancreas: Recent Labs  Lab 09/02/23 1228 09/03/23 0507 09/04/23 0549  AST 38 25  --   ALT 16 12  --   ALKPHOS 56 52  --   BILITOT 0.9 0.7  --   PROT  5.3* 5.1*  --   ALBUMIN  2.2* 2.1* 1.9*   Recent Labs  Lab 09/02/23 1228  LIPASE 22   No results for input(s): "AMMONIA" in the last 168 hours. Diabetic: No results for input(s): "HGBA1C" in the last 72 hours. No results for input(s): "GLUCAP" in the last 168 hours. Cardiac Enzymes: No results for input(s): "CKTOTAL", "CKMB", "CKMBINDEX", "TROPONINI" in the last 168 hours. No results for input(s): "PROBNP" in the last 8760 hours. Coagulation Profile: No results for input(s): "INR", "PROTIME" in the last 168 hours. Thyroid  Function Tests: Recent Labs    09/03/23 0505  TSH 1.487   Lipid Profile: No results for input(s): "CHOL", "HDL", "LDLCALC", "TRIG", "CHOLHDL", "LDLDIRECT" in the last 72 hours. Anemia Panel: No results for input(s): "VITAMINB12", "FOLATE", "FERRITIN", "TIBC", "IRON", "RETICCTPCT" in the last 72 hours. Urine analysis:    Component Value Date/Time   COLORURINE AMBER (A) 09/02/2023 1734   APPEARANCEUR CLOUDY (A) 09/02/2023 1734   LABSPEC 1.018 09/02/2023 1734   PHURINE 6.0 09/02/2023 1734   GLUCOSEU NEGATIVE 09/02/2023 1734   HGBUR NEGATIVE 09/02/2023 1734   BILIRUBINUR NEGATIVE 09/02/2023 1734   KETONESUR NEGATIVE 09/02/2023 1734   PROTEINUR 100 (A) 09/02/2023 1734   NITRITE NEGATIVE 09/02/2023 1734   LEUKOCYTESUR LARGE (A) 09/02/2023 1734   Sepsis Labs: Invalid input(s): "PROCALCITONIN", "LACTICIDVEN"  Microbiology: Recent Results (from the past 240 hours)  Resp panel by RT-PCR (RSV, Flu A&B, Covid) Anterior Nasal Swab     Status: None   Collection Time: 09/02/23 12:28 PM   Specimen: Anterior Nasal Swab  Result Value Ref Range Status   SARS Coronavirus 2 by RT PCR NEGATIVE NEGATIVE Final   Influenza A by PCR NEGATIVE NEGATIVE Final   Influenza B by PCR NEGATIVE NEGATIVE Final    Comment: (NOTE) The Xpert Xpress SARS-CoV-2/FLU/RSV plus assay is intended as an aid in the diagnosis of influenza from Nasopharyngeal swab specimens and should not be  used as a sole basis for treatment. Nasal washings and aspirates are unacceptable for Xpert Xpress SARS-CoV-2/FLU/RSV testing.  Fact Sheet for Patients: BloggerCourse.com  Fact Sheet for Healthcare Providers: SeriousBroker.it  This test is not yet approved or cleared by the [place]  FDA and has been authorized for detection and/or diagnosis of SARS-CoV-2 by FDA under an Emergency Use Authorization (EUA). This EUA will remain in effect (meaning this test can be used) for the duration of the COVID-19 declaration under Section 564(b)(1) of the Act, 21 U.S.C. section 360bbb-3(b)(1), unless the authorization is terminated or revoked.     Resp Syncytial Virus by PCR  NEGATIVE NEGATIVE Final    Comment: (NOTE) Fact Sheet for Patients: BloggerCourse.com  Fact Sheet for Healthcare Providers: SeriousBroker.it  This test is not yet approved or cleared by the [place]  FDA and has been authorized for detection and/or diagnosis of SARS-CoV-2 by FDA under an Emergency Use Authorization (EUA). This EUA will remain in effect (meaning this test can be used) for the duration of the COVID-19 declaration under Section 564(b)(1) of the Act, 21 U.S.C. section 360bbb-3(b)(1), unless the authorization is terminated or revoked.  Performed at [facility], [address] [place], [place] [address]   Urine Culture     Status: Abnormal (Preliminary result)   Collection Time: 09/02/23  5:34 PM   Specimen: Urine, Random  Result Value Ref Range Status   Specimen Description URINE, RANDOM  Final   Special Requests NONE Reflexed from [id number]  Final   Culture (A)  Final    >=100,000 COLONIES/mL ENTEROCOCCUS FAECALIS SUSCEPTIBILITIES TO FOLLOW Performed at [facility], [address], [place], [place] [address]    Report Status PENDING  Incomplete    Radiology Studies: No results  found.     Rudolf Blizard T. Ameria Sanjurjo [facility]  If 7PM-7AM, please contact night-coverage [email or web address] 09/04/2023, 5:03 PM

## 2023-09-04 NOTE — TOC Initial Note (Signed)
 Transition of Care [facility]) - Initial/Assessment Note    Patient Details  Name: Holly Nunez MRN: [id number] Date of Birth: 25-Feb-1925  Transition of Care [facility]) CM/SW Contact:    Valley Gavia, LCSWA Phone Number: 09/04/2023, 10:44 AM  Clinical Narrative:                  CSW spoke with pt's daughter Amos Balint, she confirms pt is from [facility] long term, current plan is for pt to return back when medically stable. Per Amos Balint, MD recommends Palliative to follow at [facility] [place] agreeable. [name] confirms pt has been bed bound at [facility] but was able to ambulate with a walker when she first arrived, unsure if PT would be helpful at this point. TOC will continue to follow.         Patient Goals and CMS Choice            Expected Discharge Plan and Services                                              Prior Living Arrangements/Services                       Activities of Daily Living   ADL Screening (condition at time of admission) Independently performs ADLs?: No Does the patient have a NEW difficulty with bathing/dressing/toileting/self-feeding that is expected to last >3 days?: No Does the patient have a NEW difficulty with getting in/out of bed, walking, or climbing stairs that is expected to last >3 days?: No Does the patient have a NEW difficulty with communication that is expected to last >3 days?: No Is the patient deaf or have difficulty hearing?: Yes Does the patient have difficulty seeing, even when wearing glasses/contacts?: No Does the patient have difficulty concentrating, remembering, or making decisions?: Yes  Permission Sought/Granted                  Emotional Assessment              Admission diagnosis:  Atrial fibrillation with RVR (HCC) [I48.91] Patient Active Problem List   Diagnosis Date Noted   Atrial fibrillation with RVR (HCC) 09/03/2023   Multifocal atrial tachycardia (HCC) 09/03/2023   Elevated troponin  09/03/2023   Acute cystitis 09/03/2023   Chronic combined systolic and diastolic heart failure (HCC) 09/03/2023   Anemia of chronic disease 09/03/2023   Atrial fibrillation with rapid ventricular response (HCC) 08/20/2023   Hypokalemia 08/20/2023   Hypomagnesemia 08/20/2023   Pleural effusion, right 08/20/2023   Pressure injury of skin 07/16/2023   History of UTI 04/04/2023   Cognitive deficits 05/18/2021   Pancreatic mass 05/18/2021   Abnormal ECG 05/15/2021   CKD stage 3a, GFR 45-59 ml/min (HCC) 05/14/2021   Paroxysmal atrial fibrillation (HCC) 05/14/2021   Pancreatic pseudocyst/cyst 05/14/2021   History of TIA (transient ischemic attack) 04/16/2020   Dyspepsia 08/21/2019   Debility 06/21/2017   Great toe pain, left 12/02/2016   Vitamin D  deficiency 12/02/2016   Estrogen deficiency 05/27/2016   Overactive bladder 12/07/2015   Allergy 12/07/2015   Multinodular goiter (nontoxic) 04/25/2015   History of chicken pox    Essential hypertension 02/19/2014   HLD (hyperlipidemia) 02/19/2014   DJD (degenerative joint disease) 02/19/2014   S/P hysterectomy 02/19/2014   Normocytic anemia 02/19/2014   Urinary incontinence 02/19/2014  PCP:  Default, Provider, MD Pharmacy:   [facility] - [place], [place] - [phone] [address] [place] [address] Phone: [phone] Fax: [phone]     Social Drivers of Health (SDOH) Social History: SDOH Screenings   Food Insecurity: No Food Insecurity (09/02/2023)  Housing: Low Risk  (09/02/2023)  Transportation Needs: No Transportation Needs (09/02/2023)  Utilities: Not At Risk (09/02/2023)  Alcohol  Screen: Low Risk  (01/29/2021)  Depression (PHQ2-9): Low Risk  (01/29/2021)  Financial Resource Strain: Low Risk  (01/29/2021)  Physical Activity: Inactive (01/29/2021)  Social Connections: Socially Isolated (09/02/2023)  Stress: No Stress Concern Present (01/29/2021)  Tobacco Use: Low Risk  (09/03/2023)    SDOH Interventions:     Readmission Risk Interventions     No data to display

## 2023-09-04 NOTE — Progress Notes (Signed)
 PT Cancellation Note  Patient Details Name: Holly Nunez MRN: [id number] DOB: 07/09/1924   Cancelled Treatment:    Reason Eval/Treat Not Completed: Medical issues which prohibited therapy (PT/OT advised to hold therapy today per MD due to BP and HR. Acute PT to re-attempt as medically appropriate.)  Taliesin Hartlage W, PT, DPT Secure Chat Preferred  Rehab Office [phone]   Alissa April Adela Ades 09/04/2023, 12:32 PM

## 2023-09-04 NOTE — Progress Notes (Signed)
 Patient reported left chest discomfort that was relieved with repositioniing.  Stated it is worse with movement and palpation.  BP have not allowed scheduled metoprolol  SBP 80s to low 100s, HR 90s to 130s - does not sustain in 130s.  Patient remains asymptomatic.

## 2023-09-05 DIAGNOSIS — N3000 Acute cystitis without hematuria: Secondary | ICD-10-CM | POA: Diagnosis not present

## 2023-09-05 DIAGNOSIS — R5381 Other malaise: Secondary | ICD-10-CM

## 2023-09-05 DIAGNOSIS — Z515 Encounter for palliative care: Secondary | ICD-10-CM

## 2023-09-05 DIAGNOSIS — I5042 Chronic combined systolic (congestive) and diastolic (congestive) heart failure: Secondary | ICD-10-CM | POA: Diagnosis not present

## 2023-09-05 DIAGNOSIS — Z7189 Other specified counseling: Secondary | ICD-10-CM | POA: Diagnosis not present

## 2023-09-05 DIAGNOSIS — I4719 Other supraventricular tachycardia: Secondary | ICD-10-CM | POA: Diagnosis not present

## 2023-09-05 DIAGNOSIS — D638 Anemia in other chronic diseases classified elsewhere: Secondary | ICD-10-CM | POA: Diagnosis not present

## 2023-09-05 DIAGNOSIS — R7989 Other specified abnormal findings of blood chemistry: Secondary | ICD-10-CM | POA: Diagnosis not present

## 2023-09-05 LAB — URINE CULTURE: Culture: >=100000 — AB

## 2023-09-05 LAB — RENAL FUNCTION PANEL
Albumin: 2.1 g/dL — ABNORMAL LOW (ref 3.5–5.0)
Anion gap: 8 (ref 5–15)
BUN: 11 mg/dL (ref 8–23)
CO2: 27 mmol/L (ref 22–32)
Calcium: 8.8 mg/dL — ABNORMAL LOW (ref 8.9–10.3)
Chloride: 104 mmol/L (ref 98–111)
Creatinine, Ser: 0.82 mg/dL (ref 0.44–1.00)
GFR, Estimated: >60 mL/min (ref >60)
Glucose, Bld: 94 mg/dL (ref 70–99)
Phosphorus: 3 mg/dL (ref 2.5–4.6)
Potassium: 4.1 mmol/L (ref 3.5–5.1)
Sodium: 139 mmol/L (ref 135–145)

## 2023-09-05 LAB — CBC
HCT: 29.9 % — ABNORMAL LOW (ref 36.0–46.0)
Hemoglobin: 10 g/dL — ABNORMAL LOW (ref 12.0–15.0)
MCH: 31.8 pg (ref 26.0–34.0)
MCHC: 33.4 g/dL (ref 30.0–36.0)
MCV: 95.2 fL (ref 80.0–100.0)
Platelets: 215 10*3/uL (ref 150–400)
RBC: 3.14 MIL/uL — ABNORMAL LOW (ref 3.87–5.11)
RDW: 18.5 % — ABNORMAL HIGH (ref 11.5–15.5)
WBC: 5.7 10*3/uL (ref 4.0–10.5)
nRBC: 0 % (ref 0.0–0.2)

## 2023-09-05 LAB — MRSA NEXT GEN BY PCR, NASAL: MRSA by PCR Next Gen: DETECTED — AB

## 2023-09-05 LAB — MAGNESIUM: Magnesium: 1.8 mg/dL (ref 1.7–2.4)

## 2023-09-05 MED ORDER — METOPROLOL TARTRATE 25 MG PO TABS: 25.0000 mg | ORAL_TABLET | Freq: Four times a day (QID) | ORAL | Status: DC

## 2023-09-05 MED ORDER — METOPROLOL TARTRATE 25 MG PO TABS
25.0000 mg | ORAL_TABLET | Freq: Four times a day (QID) | ORAL | Status: DC
  Administered 2023-09-06: 25 mg via ORAL
  Filled 2023-09-05: qty 1

## 2023-09-05 MED ORDER — TORSEMIDE 20 MG PO TABS
20.0000 mg | ORAL_TABLET | ORAL | Status: DC
  Administered 2023-09-06: 20 mg via ORAL
  Filled 2023-09-05: qty 1

## 2023-09-05 MED ORDER — ACETAMINOPHEN 325 MG PO TABS
650.0000 mg | ORAL_TABLET | Freq: Four times a day (QID) | ORAL | Status: DC
  Administered 2023-09-05 – 2023-09-06 (×2): 650 mg via ORAL
  Filled 2023-09-05 (×3): qty 2

## 2023-09-05 MED ORDER — METOPROLOL TARTRATE 12.5 MG HALF TABLET
12.5000 mg | ORAL_TABLET | Freq: Once | ORAL | Status: DC
Start: 2023-09-05 — End: 2023-09-06
  Filled 2023-09-05: qty 1

## 2023-09-05 MED ORDER — DIGOXIN 0.25 MG/ML IJ SOLN
0.1250 mg | Freq: Every day | INTRAMUSCULAR | Status: DC
  Administered 2023-09-05 – 2023-09-06 (×2): 0.125 mg via INTRAVENOUS
  Filled 2023-09-05 (×3): qty 0.5

## 2023-09-05 MED ORDER — MIDODRINE HCL 5 MG PO TABS
7.5000 mg | ORAL_TABLET | Freq: Three times a day (TID) | ORAL | Status: DC
  Administered 2023-09-06: 7.5 mg via ORAL
  Filled 2023-09-05: qty 2

## 2023-09-05 MED ORDER — MIDODRINE HCL 5 MG PO TABS
5.0000 mg | ORAL_TABLET | Freq: Three times a day (TID) | ORAL | Status: DC
Start: 2023-09-05 — End: 2023-09-05
  Administered 2023-09-05 (×2): 5 mg via ORAL
  Filled 2023-09-05 (×2): qty 1

## 2023-09-05 NOTE — Evaluation (Signed)
 Occupational Therapy Evaluation Patient Details Name: Holly Nunez MRN: [id number] DOB: 01/18/1925 Today's Date: 09/05/2023   History of Present Illness   Holly Nunez is a 89 y.o. female with medical history significant for paroxysmal atrial fibrillation, chronic systolic/diastolic heart failure, moderate mitral regurgitation, hyperlipidemia, anemia of chronic disease associated baseline hemoglobin range 10-12, who is admitted to [facility] on 09/02/2023 with tachycardia, felt to be multifocal atrial tachycardia versus sinus tachycardia with PACs, after presenting from outpatient heart failure clinic to [facility] ED for evaluation of tachycardia.     Clinical Impressions Pt resides at [facility] as a LTC resident and per pt's daughter, she has been bedbound ~2 months and requires total A with bathing, dressing and toileting at bed level but that pt was able to feed and groom herself with sup. Pt requires total +2 for all aspects of bed mobility to sit EOB with max A for trunk/UB support < 30 seconds. OT will follow acutely to trial this pt focusing on goals listed in this eval.     If plan is discharge home, recommend the following:   A lot of help with bathing/dressing/bathroom;Two people to help with walking and/or transfers     Functional Status Assessment         Equipment Recommendations   Hoyer lift;Hospital bed     Recommendations for Other Services         Precautions/Restrictions   Precautions Precautions: Fall;Other (comment) Precaution/Restrictions Comments: watch HR and O2 Restrictions Weight Bearing Restrictions Per Provider Order: No     Mobility Bed Mobility Overal bed mobility: Needs Assistance Bed Mobility: Supine to Sit, Sit to Supine     Supine to sit: Total assist, +2 for physical assistance Sit to supine: Total assist, +2 for physical assistance   General bed mobility comments: helicopter technique required to sit EOB, pt  moaning out in pain and requires total for UB balance/support; pt was returned to supine <30 seconds    Transfers                   General transfer comment: unable, has been bedbound for ~2 months per pt's daughter      Balance Overall balance assessment: Needs assistance Sitting-balance support: Bilateral upper extremity supported, Feet supported Sitting balance-Leahy Scale: Poor Sitting balance - Comments: max A once seated EOB to support/balance UB <30 seconds                                   ADL either performed or assessed with clinical judgement   ADL Overall ADL's : Needs assistance/impaired Eating/Feeding: Moderate assistance Eating/Feeding Details (indicate cue type and reason): pt has AROM WFL to perform self feeding, however pt with poor appetite and significant weakness Grooming: Wash/dry hands;Wash/dry face;Moderate assistance;Bed level Grooming Details (indicate cue type and reason): hand over hand min A to initiate Upper Body Bathing: Total assistance;Bed level   Lower Body Bathing: Total assistance;Bed level   Upper Body Dressing : Total assistance;Bed level   Lower Body Dressing: Total assistance;Bed level     Toilet Transfer Details (indicate cue type and reason): unable Toileting- Clothing Manipulation and Hygiene: Total assistance;Bed level       Functional mobility during ADLs: Total assistance;+2 for physical assistance General ADL Comments: pt is total A at bed level at baseline. Daughter reports that she was able to feed and groom herself with set up/Sup previously  Vision Baseline Vision/History: 1 Wears glasses Ability to See in Adequate Light: 0 Adequate Patient Visual Report: No change from baseline       Perception         Praxis         Pertinent Vitals/Pain Pain Assessment Pain Assessment: Faces Faces Pain Scale: Hurts whole lot Pain Location: generalized during sup-sit transition Pain Descriptors /  Indicators: Discomfort, Grimacing, Moaning Pain Intervention(s): Limited activity within patient's tolerance, Monitored during session, Repositioned     Extremity/Trunk Assessment Upper Extremity Assessment Upper Extremity Assessment: Generalized weakness;Left hand dominant;LUE deficits/detail LUE Deficits / Details: impaired extension of L elbow and FF of L shoulder, pain observed at ~ 0-60 degrees LUE: Shoulder pain with ROM   Lower Extremity Assessment Lower Extremity Assessment: Defer to PT evaluation       Communication Communication Communication: No apparent difficulties Factors Affecting Communication: Reduced clarity of speech   Cognition Arousal: Alert Behavior During Therapy: WFL for tasks assessed/performed Cognition: No apparent impairments                                       Cueing  General Comments   Cueing Techniques: Verbal cues      Exercises     Shoulder Instructions      Home Living Family/patient expects to be discharged to:: Skilled nursing facility                                 Additional Comments: has been at [facility] for 2 years      Prior Functioning/Environment Prior Level of Function : Needs assist             Mobility Comments: Pt's daughter reports staff assists with stand and pivot to W/C. Has not taken steps for ~ 2 months ADLs Comments: does self feeding and grooming tasks with set up/Sup, assist for all other ADLs at bed level    OT Problem List: Impaired balance (sitting and/or standing);Decreased strength;Pain;Decreased range of motion;Decreased activity tolerance;Decreased coordination   OT Treatment/Interventions:        OT Goals(Current goals can be found in the care plan section)   Acute Rehab OT Goals Patient Stated Goal: none stated ADL Goals Pt Will Perform Eating: with min assist;with supervision;sitting;with caregiver independent in assisting Pt Will Perform Grooming:  with min assist;with supervision;sitting;with caregiver independent in assisting Additional ADL Goal #1: Pt will tolerate sitting EOB x 5 minutes for grooming tasks and activity tolerance   OT Frequency:       Co-evaluation PT/OT/SLP Co-Evaluation/Treatment: Yes Reason for Co-Treatment: For patient/therapist safety   OT goals addressed during session: ADL's and self-care      AM-PAC OT "6 Clicks" Daily Activity     Outcome Measure Help from another person eating meals?: A Lot Help from another person taking care of personal grooming?: A Lot Help from another person toileting, which includes using toliet, bedpan, or urinal?: Total Help from another person bathing (including washing, rinsing, drying)?: Total Help from another person to put on and taking off regular upper body clothing?: Total Help from another person to put on and taking off regular lower body clothing?: Total 6 Click Score: 8   End of Session    Activity Tolerance: Patient limited by pain;Patient limited by fatigue Patient left: in bed;with call bell/phone within reach;with  family/visitor present  OT Visit Diagnosis: Muscle weakness (generalized) (M62.81);History of falling (Z91.81);Other abnormalities of gait and mobility (R26.89);Pain Pain - part of body:  (generalized with bed mobility)                Time: 1206-1223 OT Time Calculation (min): 17 min Charges:  OT General Charges $OT Visit: 1 Visit OT Evaluation $OT Eval Moderate Complexity: 1 Mod    Alfred Ann 09/05/2023, 3:52 PM

## 2023-09-05 NOTE — Evaluation (Signed)
 Physical Therapy Evaluation Patient Details Name: Holly Nunez MRN: [id number] DOB: 01-Jan-1925 Today's Date: 09/05/2023  History of Present Illness  Holly Nunez is a 89 y.o. female with medical history significant for paroxysmal atrial fibrillation, chronic systolic/diastolic heart failure, moderate mitral regurgitation, hyperlipidemia, anemia of chronic disease associated baseline hemoglobin range 10-12, who is admitted to [facility] on 09/02/2023 with tachycardia, felt to be multifocal atrial tachycardia versus sinus tachycardia with PACs, after presenting from outpatient heart failure clinic to [facility] ED for evaluation of tachycardia.  Clinical Impression  Pt is from [facility] where she is a long term resident. Per daughter pt has been bed bound the last 2 months and has required total A for getting from bed to wheelchair. Pt is total Ax2 to get to EoB, only able to sit ~ 30 seconds due to sacral wound pain. Pt is mostly at her baseline level and should return to [facility] when medically ready.         If plan is discharge home, recommend the following: Two people to help with walking and/or transfers;Two people to help with bathing/dressing/bathroom;Assistance with cooking/housework;Assistance with feeding;Direct supervision/assist for medications management;Direct supervision/assist for financial management;Assist for transportation;Help with stairs or ramp for entrance;Supervision due to cognitive status   Can travel by private vehicle   No    Equipment Recommendations None recommended by PT     Functional Status Assessment Patient has not had a recent decline in their functional status     Precautions / Restrictions Precautions Precautions: Fall;Other (comment) Precaution/Restrictions Comments: watch HR and O2 Restrictions Weight Bearing Restrictions Per Provider Order: No      Mobility  Bed Mobility Overal bed mobility: Needs Assistance Bed Mobility: Supine  to Sit, Sit to Supine     Supine to sit: Total assist, +2 for physical assistance Sit to supine: Total assist, +2 for physical assistance   General bed mobility comments: helicopter technique required to sit EOB, pt moaning out in pain and requires total for UB balance/support; pt was returned to supine <30 seconds    Transfers                   General transfer comment: unable, has been bedbound for ~2 months per pt's daughter       Balance Overall balance assessment: Needs assistance Sitting-balance support: Bilateral upper extremity supported, Feet supported Sitting balance-Leahy Scale: Poor Sitting balance - Comments: max A once seated EOB to support/balance UB <30 seconds                                     Pertinent Vitals/Pain Pain Assessment Pain Assessment: Faces Faces Pain Scale: Hurts whole lot Breathing: normal Negative Vocalization: occasional moan/groan, low speech, negative/disapproving quality Facial Expression: smiling or inexpressive Body Language: relaxed Consolability: no need to console PAINAD Score: 1 Pain Location: generalized during sup-sit transition Pain Descriptors / Indicators: Discomfort, Grimacing, Moaning Pain Intervention(s): Limited activity within patient's tolerance, Monitored during session, Repositioned    Home Living Family/patient expects to be discharged to:: Skilled nursing facility                   Additional Comments: has been at [facility] for 2 years    Prior Function Prior Level of Function : Needs assist             Mobility Comments: Pt's daughter reports staff assists with  stand and pivot to W/C. Has not taken steps for ~ 2 months ADLs Comments: does self feeding and grooming tasks with set up/Sup, assist for all other ADLs at bed level     Extremity/Trunk Assessment   Upper Extremity Assessment Upper Extremity Assessment: Defer to OT evaluation LUE Deficits / Details: impaired  extension of L elbow and FF of L shoulder, pain observed at ~ 0-60 degrees LUE: Shoulder pain with ROM    Lower Extremity Assessment Lower Extremity Assessment: RLE deficits/detail;LLE deficits/detail RLE Deficits / Details: decreased dorsiflexion, knee extension RLE: Unable to fully assess due to pain LLE Deficits / Details: decreased dorsiflexion, knee extension LLE: Unable to fully assess due to pain    Cervical / Trunk Assessment Cervical / Trunk Assessment: Kyphotic  Communication   Communication Communication: No apparent difficulties Factors Affecting Communication: Reduced clarity of speech    Cognition Arousal: Alert Behavior During Therapy: WFL for tasks assessed/performed                                     Cueing Cueing Techniques: Verbal cues     General Comments General comments (skin integrity, edema, etc.): pain in sitting due to sacral pain        Assessment/Plan    PT Assessment Patient does not need any further PT services         PT Goals (Current goals can be found in the Care Plan section)  Acute Rehab PT Goals PT Goal Formulation: All assessment and education complete, DC therapy         Co-evaluation PT/OT/SLP Co-Evaluation/Treatment: Yes Reason for Co-Treatment: For patient/therapist safety   OT goals addressed during session: ADL's and self-care       AM-PAC PT "6 Clicks" Mobility  Outcome Measure Help needed turning from your back to your side while in a flat bed without using bedrails?: Total Help needed moving from lying on your back to sitting on the side of a flat bed without using bedrails?: Total Help needed moving to and from a bed to a chair (including a wheelchair)?: Total Help needed standing up from a chair using your arms (e.g., wheelchair or bedside chair)?: Total Help needed to walk in hospital room?: Total Help needed climbing 3-5 steps with a railing? : Total 6 Click Score: 6    End of Session    Activity Tolerance: Patient limited by pain Patient left: in bed;with call bell/phone within reach;with family/visitor present Nurse Communication: Mobility status PT Visit Diagnosis: Muscle weakness (generalized) (M62.81);Pain Pain - part of body:  (sacrum)    Time: 1206-1223 PT Time Calculation (min) (ACUTE ONLY): 17 min   Charges:   PT Evaluation $PT Eval Moderate Complexity: 1 Mod   PT General Charges $$ ACUTE PT VISIT: 1 Visit         Pattricia Weiher B. Jewel Mortimer PT, DPT Acute Rehabilitation Services Please use secure chat or  Call Office [phone]   Verlie Glisson [facility] 09/05/2023, 5:52 PM

## 2023-09-05 NOTE — Plan of Care (Signed)
  Problem: Education: Goal: Knowledge of General Education information will improve Description: Including pain rating scale, medication(s)/side effects and non-pharmacologic comfort measures 09/05/2023 1905 by Don Fritter, RN Outcome: Progressing 09/05/2023 1905 by Don Fritter, RN Outcome: Progressing   Problem: Clinical Measurements: Goal: Ability to maintain clinical measurements within normal limits will improve 09/05/2023 1905 by Don Fritter, RN Outcome: Progressing 09/05/2023 1905 by Don Fritter, RN Outcome: Progressing   Problem: Clinical Measurements: Goal: Will remain free from infection 09/05/2023 1905 by Don Fritter, RN Outcome: Progressing 09/05/2023 1905 by Don Fritter, RN Outcome: Progressing   Problem: Activity: Goal: Risk for activity intolerance will decrease 09/05/2023 1905 by Don Fritter, RN Outcome: Progressing 09/05/2023 1905 by Don Fritter, RN Outcome: Progressing   Problem: Nutrition: Goal: Adequate nutrition will be maintained 09/05/2023 1905 by Don Fritter, RN Outcome: Progressing 09/05/2023 1905 by Don Fritter, RN Outcome: Progressing

## 2023-09-05 NOTE — Progress Notes (Addendum)
 PROGRESS NOTE  Holly Nunez ZOX:[id number] DOB: [birth year]   PCP: Default, Provider, MD  Patient is from: SNF.  Per daughter, basically bedbound since she went to nursing home.  DOA: 09/02/2023 LOS: 2  Chief complaints Chief Complaint  Patient presents with   A-fib w/RVR     Brief Narrative / Interim history: 89 year old F with PMH of combined CHF, moderate MR, PAF not on anticoagulation due to fall risk, anemia, HLD and ambulatory dysfunction sent to ED from heart failure clinic due to tachycardia with HR in 180s to 190s, and admitted with working diagnosis of multifocal atrial tachycardia.  Troponin was slightly elevated to 101 and 103.  BNP was 420 with seem to be lower than prior value.  UA with pyuria but patient without UTI symptoms.  RVP negative.  CXR suggested left retrocardiac opacity with small bilateral effusion.  Urine culture obtained.  Started on IV ceftriaxone  for possible UTI and pneumonia.  Cardiology consulted and recommended metoprolol  to every 6 hours.   The next day, remained tachycardic with HR 120s.  Soft blood pressure.  Cardiology gave recommendation and signed off.  However, she remains tachycardic with soft blood pressure.  Adjusted beta-blocker and added low-dose midodrine .   Continued on IV ceftriaxone  for possible UTI and pneumonia.  Urine culture with VRE.  Antibiotic changed to amoxicillin   Subjective: Seen and examined earlier this morning.  No major events overnight of this morning.  Reports pain in her bottom from lying in bed.  Still tachycardic with HR in the range of 100s to 120s.  Soft blood pressures.  Patient's daughter at bedside.  Objective: Vitals:   09/04/23 2356 09/05/23 0426 09/05/23 0650 09/05/23 1116  BP: (!) 80/66 98/71  95/70  Pulse: (!) 59  (!) 119 (!) 110  Resp: 14     Temp: 97.7 F (36.5 C) (!) 97.5 F (36.4 C)    TempSrc: Axillary Axillary    SpO2:      Weight:      Height:        Examination:  GENERAL: No apparent  distress.  Appears frail. HEENT: MMM.  Vision and hearing grossly intact.  NECK: Supple.  No apparent JVD.  RESP:  No IWOB.  Fair aeration bilaterally. CVS: Irregular rhythm.  HR ranges from 100-120s.  Heart sounds normal.  ABD/GI/GU: BS+. Abd soft, NTND.  MSK/EXT:  Moves extremities.  Bilateral lower extremity weakness.  No edema. SKIN: no apparent skin lesion or wound NEURO: Awake, alert and oriented appropriately.  No apparent focal neuro deficit. PSYCH: Calm. Normal affect.   Consultants:  Cardiology  Procedures: None  Microbiology summarized: COVID-19, influenza and RSV PCR nonreactive Urine culture with Enterococcus faecalis.  Assessment and plan: Multifocal atrial tachycardia/paroxysmal A-fib-sent to ED from heart failure clinic with heart rate of 180-190s.  Unclear etiology.  Doubt PE without respiratory distress or symptoms.  Mildly elevated troponin likely demand ischemia.  BNP better than baseline.  UA with pyuria.  CXR raises concern for left retrocardiac opacity.  TSH normal. -Cardiology signed off  - Increase metoprolol  to 25 mg every 6 hours with holding parameters. - Increase midodrine  to 5 mg 3 times daily -Treat UTI and possible pneumonia as below -Optimize electrolytes  Chronic combined CHF: TTE on 4/6 with LVEF of 40 to 45%, G2 DD, severe LVH, severe LAE, moderate MR.  On torsemide  20 mg daily at home.  Appears euvolemic on exam.  Soft BP. -Cardiology recommended decreasing torsemide  to every other day.  Hold today. -  Strict intake and output, daily weight, renal functions and electrolytes  Community-acquired pneumonia: Chest x-ray suggested retrocardiac opacity.  Has no fever or leukocytosis but mildly elevated Pro-Cal to 0.99 - Continue IV ceftriaxone >> p.o. amoxicillin   Urinary tract infection:: UA with pyuria.  Urine culture with VRE.  Denies UTI symptoms but not reliable historian -Contact precaution -Ceftriaxone >> p.o. amoxicillin  4/20>>  Elevated  troponin/demand ischemia: Likely demand ischemia in the setting of #1 -Management as above  Hypotension: Improved with midodrine . -Cardiology suggested decreasing torsemide  to every other day - Increased midodrine  as above  Moderate MR-noted on recent TTE.  Anemia of chronic disease: Stable -Continue monitoring  Ambulatory dysfunction: Lately bedbound.  Per daughter, was able to ambulate with rolling walker before she went to SNF. -PT/OT  Goal of care discussion: DNR limited appropriate.  Patient is frail with advanced age and significant comorbidity as above.  Consulted palliative care after discussion with patient's daughters - Appreciate input by palliative  Hypomagnesemia -Monitor replenish as appropriate  Constipation - Bowel regimen  Body mass index is 23.08 kg/m.     Pressure injury sacrum : Present on arrival Pressure Injury 09/03/23 Sacrum Right;Left;Mid Stage 2 -  Partial thickness loss of dermis presenting as a shallow open injury with a red, pink wound bed without slough. (Active)  09/03/23 0730  Location: Sacrum  Location Orientation: Right;Left;Mid  Staging: Stage 2 -  Partial thickness loss of dermis presenting as a shallow open injury with a red, pink wound bed without slough.  Wound Description (Comments):   Present on Admission: Yes  Dressing Type Foam - Lift dressing to assess site every shift 09/03/23 0730   DVT prophylaxis:  SCDs Start: 09/03/23 0052  Code Status: DNR-Limited Family Communication: Updated daughters, Elise at bedside Level of care: Telemetry Cardiac Status is: Inpatient Remains inpatient appropriate because: Tachycardia, hypotension, pneumonia and possible UTI   Final disposition: Likely back to SNF   55 minutes with more than 50% spent in reviewing records, counseling patient/family and coordinating care.   Sch Meds:  Scheduled Meds:  acetaminophen   650 mg Oral Q6H WA   amoxicillin   500 mg Oral Q12H   aspirin  EC  81 mg  Oral q AM   atorvastatin   20 mg Oral q AM   feeding supplement  237 mL Oral BID BM   metoprolol  tartrate  12.5 mg Oral Once   metoprolol  tartrate  25 mg Oral Q6H   midodrine   5 mg Oral TID WC   [START ON 09/06/2023] torsemide   20 mg Oral QODAY   Continuous Infusions:   PRN Meds:.melatonin, metoprolol  tartrate, ondansetron  (ZOFRAN ) IV, mouth rinse, [COMPLETED] polyethylene glycol **FOLLOWED BY** polyethylene glycol, [COMPLETED] senna-docusate **FOLLOWED BY** senna-docusate  Antimicrobials: Anti-infectives (From admission, onward)    Start     Dose/Rate Route Frequency Ordered Stop   09/04/23 1745  amoxicillin  (AMOXIL ) capsule 500 mg        500 mg Oral Every 12 hours 09/04/23 1658     09/03/23 0200  cefTRIAXone  (ROCEPHIN ) 2 g in sodium chloride  0.9 % 100 mL IVPB  Status:  Discontinued        2 g 200 mL/hr over 30 Minutes Intravenous Every 24 hours 09/03/23 0125 09/04/23 1658        I have personally reviewed the following labs and images: CBC: Recent Labs  Lab 09/02/23 1353 09/03/23 0507 09/04/23 0549 09/05/23 0708  WBC 6.6 5.3 5.2 5.7  NEUTROABS 5.3 3.7  --   --   HGB 11.2*  10.2* 9.3* 10.0*  HCT 34.3* 30.6* 27.5* 29.9*  MCV 97.7 95.6 95.5 95.2  PLT 221 214 179 215   BMP &GFR Recent Labs  Lab 09/02/23 1228 09/03/23 0507 09/04/23 0549 09/05/23 0708  NA 135 137 138 139  K 4.3 4.3 3.6 4.1  CL 100 99 103 104  CO2 25 26 25 27   GLUCOSE 118* 95 110* 94  BUN 10 11 12 11   CREATININE 0.83 0.84 0.78 0.82  CALCIUM  8.6* 8.7* 8.5* 8.8*  MG  --  1.5* 1.9 1.8  PHOS  --   --  2.7 3.0   Estimated Creatinine Clearance: 28.8 mL/min (by C-G formula based on SCr of 0.82 mg/dL). Liver & Pancreas: Recent Labs  Lab 09/02/23 1228 09/03/23 0507 09/04/23 0549 09/05/23 0708  AST 38 25  --   --   ALT 16 12  --   --   ALKPHOS 56 52  --   --   BILITOT 0.9 0.7  --   --   PROT 5.3* 5.1*  --   --   ALBUMIN  2.2* 2.1* 1.9* 2.1*   Recent Labs  Lab 09/02/23 1228  LIPASE 22   No  results for input(s): "AMMONIA" in the last 168 hours. Diabetic: No results for input(s): "HGBA1C" in the last 72 hours. No results for input(s): "GLUCAP" in the last 168 hours. Cardiac Enzymes: No results for input(s): "CKTOTAL", "CKMB", "CKMBINDEX", "TROPONINI" in the last 168 hours. No results for input(s): "PROBNP" in the last 8760 hours. Coagulation Profile: No results for input(s): "INR", "PROTIME" in the last 168 hours. Thyroid  Function Tests: Recent Labs    09/03/23 0505  TSH 1.487   Lipid Profile: No results for input(s): "CHOL", "HDL", "LDLCALC", "TRIG", "CHOLHDL", "LDLDIRECT" in the last 72 hours. Anemia Panel: No results for input(s): "VITAMINB12", "FOLATE", "FERRITIN", "TIBC", "IRON", "RETICCTPCT" in the last 72 hours. Urine analysis:    Component Value Date/Time   COLORURINE AMBER (A) 09/02/2023 1734   APPEARANCEUR CLOUDY (A) 09/02/2023 1734   LABSPEC 1.018 09/02/2023 1734   PHURINE 6.0 09/02/2023 1734   GLUCOSEU NEGATIVE 09/02/2023 1734   HGBUR NEGATIVE 09/02/2023 1734   BILIRUBINUR NEGATIVE 09/02/2023 1734   KETONESUR NEGATIVE 09/02/2023 1734   PROTEINUR 100 (A) 09/02/2023 1734   NITRITE NEGATIVE 09/02/2023 1734   LEUKOCYTESUR LARGE (A) 09/02/2023 1734   Sepsis Labs: Invalid input(s): "PROCALCITONIN", "LACTICIDVEN"  Microbiology: Recent Results (from the past 240 hours)  Resp panel by RT-PCR (RSV, Flu A&B, Covid) Anterior Nasal Swab     Status: None   Collection Time: 09/02/23 12:28 PM   Specimen: Anterior Nasal Swab  Result Value Ref Range Status   SARS Coronavirus 2 by RT PCR NEGATIVE NEGATIVE Final   Influenza A by PCR NEGATIVE NEGATIVE Final   Influenza B by PCR NEGATIVE NEGATIVE Final    Comment: (NOTE) The Xpert Xpress SARS-CoV-2/FLU/RSV plus assay is intended as an aid in the diagnosis of influenza from Nasopharyngeal swab specimens and should not be used as a sole basis for treatment. Nasal washings and aspirates are unacceptable for Xpert  Xpress SARS-CoV-2/FLU/RSV testing.  Fact Sheet for Patients: BloggerCourse.com  Fact Sheet for Healthcare Providers: SeriousBroker.it  This test is not yet approved or cleared by the [place]  FDA and has been authorized for detection and/or diagnosis of SARS-CoV-2 by FDA under an Emergency Use Authorization (EUA). This EUA will remain in effect (meaning this test can be used) for the duration of the COVID-19 declaration under Section 564(b)(1) of the  Act, 21 U.S.C. section 360bbb-3(b)(1), unless the authorization is terminated or revoked.     Resp Syncytial Virus by PCR NEGATIVE NEGATIVE Final    Comment: (NOTE) Fact Sheet for Patients: BloggerCourse.com  Fact Sheet for Healthcare Providers: SeriousBroker.it  This test is not yet approved or cleared by the [place]  FDA and has been authorized for detection and/or diagnosis of SARS-CoV-2 by FDA under an Emergency Use Authorization (EUA). This EUA will remain in effect (meaning this test can be used) for the duration of the COVID-19 declaration under Section 564(b)(1) of the Act, 21 U.S.C. section 360bbb-3(b)(1), unless the authorization is terminated or revoked.  Performed at [facility], [address] [place], [place] [address]   Urine Culture     Status: Abnormal   Collection Time: 09/02/23  5:34 PM   Specimen: Urine, Random  Result Value Ref Range Status   Specimen Description URINE, RANDOM  Final   Special Requests   Final    NONE Reflexed from [id number] Performed at [facility], [address] [place], [place] [address]    Culture (A)  Final    >=100,000 COLONIES/mL ENTEROCOCCUS FAECALIS VANCOMYCIN RESISTANT ENTEROCOCCUS ISOLATED    Report Status 09/05/2023 FINAL  Final   Organism ID, Bacteria ENTEROCOCCUS FAECALIS (A)  Final      Susceptibility   Enterococcus faecalis - MIC*    AMPICILLIN  <=2 SENSITIVE Sensitive     NITROFURANTOIN <=16 SENSITIVE Sensitive     VANCOMYCIN >=32 RESISTANT Resistant     LINEZOLID 2 SENSITIVE Sensitive     * >=100,000 COLONIES/mL ENTEROCOCCUS FAECALIS    Radiology Studies: No results found.     Mesa Janus T. Dejanique Ruehl [facility]  If 7PM-7AM, please contact night-coverage [email or web address] 09/05/2023, 1:39 PM

## 2023-09-05 NOTE — Consult Note (Signed)
 Consultation Note Date: 09/05/2023   Patient Name: Holly Nunez  DOB: Jun 07, 1924  MRN: [id number]  Age / Sex: 89 y.o., female  PCP: Default, Provider, MD Referring Physician: Theadore Finger, MD  Reason for Consultation: Establishing goals of care  HPI/Patient Profile: 89 y.o. female  with past medical history of paroxysmal atrial fibrillation, chronic systolic/diastolic heart failure, moderate mitral regurgitation, hyperlipidemia, anemia of chronic disease associated baseline hemoglobin range 10-12, admitted on 09/02/2023 with  tachycardia after presenting from outpatient heart failure clinic to [facility] ED for evaluation.   Her most recent echocardiogram occurred on 08/21/2023, which was notable for LVEF 40 to 45%, severe left ventricular hypertrophy, grade 2 diastolic dysfunction, normal right ventricular systolic function, severely dilated left atrium, and moderate mitral regurgitation.  Patient was admitted for further evaluation of tachycardia and concern for acute cystitis.  She is also being treated for community-acquired pneumonia.  PMT has been consulted to assist with goals of care conversation.  Clinical Assessment and Goals of Care:  I have reviewed medical records including EPIC notes, labs and imaging, assessed the patient and then and at the bedside with his daughter Holly Nunez to discuss diagnosis prognosis, GOC, EOL wishes, disposition and options.  I introduced Palliative Medicine as specialized medical care for people living with serious illness. It focuses on providing relief from the symptoms and stress of a serious illness. The goal is to improve quality of life for both the patient and the family.  We discussed a brief life review of the patient and then focused on their current illness.  The natural disease trajectory and expectations at EOL were discussed.  I attempted to elicit values and goals of care important to the patient.    Medical  History Review and Understanding:  We discussed patient's acute illness in the context of her chronic comorbidities.  Discussed heart failure, arrhythmia, recurrent UTIs, with recurrent hospitalizations and implications on patient's long-term prognosis.  Social History: Patient is widowed.  She has 3 living children (daughter Holly Nunez, daughter Holly Nunez, son Holly Nunez) and 1 daughter who has died of pancreatic cancer.  She is a woman of faith.  Previously lived in [place]  before relocating to [place]  to be near her son.  She was moved to [place]  at the time of her daughter's cancer diagnosis.  Functional and Nutritional State: Patient had falls about 2 to 3 years ago and was using a walker at that time, then placed in long-term care facility.  She has continued to decline, more notably so in the past 6 months with weight loss and decreased appetite.  Albumin  of 2.1 noted.  She is hand fed.  Palliative Symptoms: Stage II ulcer/sacral pain and arthritis pain Fatigue  Advance Directives: A detailed discussion regarding advanced directives was had.  Patient has not completed previously.  Provided copy of paperwork and encourage completion during this admission.   Code Status: Concepts specific to code status, artifical feeding and hydration, and rehospitalization were considered and discussed.  DNR/DNI confirmed.  Discussion: Patient was sleeping throughout our conversation.  Patient's daughter Holly Nunez has a good understanding the severity of patient's illness and uncertainty in her needs/prognosis moving forward.  We discussed the caregiver burden on patient's family and the facility, which unfortunately does not make a lot of the catches regarding her health needs and acute illnesses.  This usually is found by family.  Patient has made comments to her daughter Holly Nunez that she is "tired of being tired" and tired of always being in pain.  Patient has not discussed this much with other family  members who visited very frequently.  Holly Nunez is realistic about patient's prognosis given her age and debility.  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  She is certainly interested in palliative care follow-up moving forward for ongoing goals of care discussions when patient returns to SNF.  A MOST form was introduced and offered to complete prior to admission if patient and family are ready.  Holly Nunez would like to talk with her siblings further about the MOST form and advance directives if they would like to complete prior to discharge.   The difference between aggressive medical intervention and comfort care was considered in light of the patient's goals of care. Hospice and Palliative Care services outpatient were explained and offered.   Discussed the importance of continued conversation with family and the medical providers regarding overall plan of care and treatment options, ensuring decisions are within the context of the patient's values and GOCs.   Questions and concerns were addressed.The family was encouraged to call with questions or concerns.  PMT will continue to support holistically.   SUMMARY OF RECOMMENDATIONS   -Continue DNR/DNI -Continue current care plan -Patient's daughter is realistic about patient's overall prognosis -MOST form introduced today - Encourage completion of advance directive if patient is willing/able prior to discharge -TOC consulted for outpatient palliative care referral.  Assistance is appreciated -Psychosocial and emotional for provided -PMT will continue to follow and support  Prognosis:  Poor, would not be surprising if she would be appropriate for hospice sooner rather than later  Discharge Planning: Skilled Nursing Facility for rehab with Palliative care service follow-up      Primary Diagnoses: Present on Admission:  Multifocal atrial tachycardia (HCC)  Elevated  troponin  Acute cystitis  Paroxysmal atrial fibrillation (HCC)  Chronic combined systolic and diastolic heart failure (HCC)  HLD (hyperlipidemia)  Anemia of chronic disease   Physical Exam Vitals and nursing note reviewed.  Constitutional:      General: She is sleeping. She is not in acute distress. Cardiovascular:     Rate and Rhythm: Tachycardia present.  Pulmonary:     Effort: Pulmonary effort is normal. No respiratory distress.    Vital Signs: BP 98/71 (BP Location: Left Arm)   Pulse (!) 119   Temp (!) 97.5 F (36.4 C) (Axillary)   Resp 14   Ht 5' (1.524 m)   Wt 53.6 kg   SpO2 97%   BMI 23.08 kg/m  Pain Scale: 0-10   Pain Score: Asleep   SpO2: SpO2: 97 % O2 Device:SpO2: 97 % O2 Flow Rate: .    Palliative Assessment/Data: 30%     Holly Kutzer P Andreu Drudge, PA-C  Palliative Medicine Team Team phone # [phone]  Thank you for allowing the Palliative Medicine Team to assist in the care of this patient. Please utilize secure chat with additional questions, if there is no response within 30 minutes please call the above phone number.  Palliative Medicine Team providers are available by phone from 7am to 7pm daily and can be reached through the team cell phone.  Should this patient require assistance outside of these hours, please call the patient's attending physician.

## 2023-09-06 DIAGNOSIS — Z7189 Other specified counseling: Secondary | ICD-10-CM | POA: Diagnosis not present

## 2023-09-06 DIAGNOSIS — I5042 Chronic combined systolic (congestive) and diastolic (congestive) heart failure: Secondary | ICD-10-CM | POA: Diagnosis not present

## 2023-09-06 DIAGNOSIS — Z515 Encounter for palliative care: Secondary | ICD-10-CM | POA: Diagnosis not present

## 2023-09-06 DIAGNOSIS — N3000 Acute cystitis without hematuria: Secondary | ICD-10-CM | POA: Diagnosis not present

## 2023-09-06 DIAGNOSIS — I4891 Unspecified atrial fibrillation: Secondary | ICD-10-CM

## 2023-09-06 DIAGNOSIS — R7989 Other specified abnormal findings of blood chemistry: Secondary | ICD-10-CM | POA: Diagnosis not present

## 2023-09-06 DIAGNOSIS — I4719 Other supraventricular tachycardia: Secondary | ICD-10-CM | POA: Diagnosis not present

## 2023-09-06 MED ORDER — TORSEMIDE 20 MG PO TABS: 20.0000 mg | ORAL_TABLET | ORAL | Status: DC

## 2023-09-06 MED ORDER — DIGOXIN 125 MCG PO TABS
0.0625 mg | ORAL_TABLET | Freq: Every day | ORAL | Status: DC
Start: 2023-09-06 — End: 2023-11-18

## 2023-09-06 MED ORDER — CHLORHEXIDINE GLUCONATE CLOTH 2 % EX PADS
6.0000 | MEDICATED_PAD | Freq: Every day | CUTANEOUS | Status: DC
  Administered 2023-09-06: 6 via TOPICAL

## 2023-09-06 MED ORDER — METOPROLOL TARTRATE 25 MG PO TABS: 25.0000 mg | ORAL_TABLET | Freq: Four times a day (QID) | ORAL | Status: DC

## 2023-09-06 MED ORDER — AMOXICILLIN 500 MG PO CAPS: 500.0000 mg | ORAL_CAPSULE | Freq: Two times a day (BID) | ORAL | 0 refills | Status: AC

## 2023-09-06 MED ORDER — MUPIROCIN 2 % EX OINT
TOPICAL_OINTMENT | Freq: Two times a day (BID) | CUTANEOUS | Status: DC
  Filled 2023-09-06: qty 22

## 2023-09-06 MED ORDER — MIDODRINE HCL 2.5 MG PO TABS: 7.5000 mg | ORAL_TABLET | Freq: Three times a day (TID) | ORAL | Status: DC

## 2023-09-06 NOTE — Progress Notes (Signed)
 Daily Progress Note   Patient Name: Holly Nunez       Date: 09/06/2023 DOB: 16-Dec-1924  Age: 89 y.o. MRN#: [id number] Attending Physician: Theadore Finger, MD Primary Care Physician: Default, Provider, MD Admit Date: 09/02/2023  Reason for Consultation/Follow-up: Establishing goals of care  Subjective: Medical records reviewed including progress notes, labs and imaging. Patient assessed at the bedside. She is eating breakfast in no acute distress. Her daughter Holly Nunez is present visiting.  Created space and opportunity for patient and family's thoughts and feelings on her current illness. They have reviewed MOST form and filled out based on patient's care preferences. They would also like to proceed with completion of advanced directives during this admission. No other concerns at this time.  An extensive conversation was had, covering concepts specific to code status, artifical feeding and hydration, continued IV antibiotics and rehospitalization. The following selections were made:  Cardiopulmonary Resuscitation: Do Not Attempt Resuscitation (DNR/No CPR)  Medical Interventions: Limited Additional Interventions: Use medical treatment, IV fluids, and cardiac monitoring as indicated. Do not use intubation or mechanical ventilation. May consider use of less invasive airway support such as BiPAP or CPAP. Also provide comfort measures. Transfer to hospital if indicated. Avoid intensive care.  Antibiotics: Antibiotics if indicated  IV Fluids: IV fluids if indicated  Feeding Tube: No feeding tube     Questions and concerns addressed. PMT will continue to support holistically.   Length of Stay: 3   Physical Exam Vitals and nursing note reviewed.  Constitutional:      General: She is not in acute distress. Cardiovascular:     Rate and  Rhythm: Tachycardia present.  Pulmonary:     Effort: Pulmonary effort is normal.  Neurological:     Mental Status: She is alert. Mental status is at baseline.  Psychiatric:        Behavior: Behavior normal.            Vital Signs: BP 91/77   Pulse (!) 115   Temp 97.8 F (36.6 C) (Axillary)   Resp 12   Ht 5' (1.524 m)   Wt 53.6 kg   SpO2 98%   BMI 23.08 kg/m  SpO2: SpO2: 98 % O2 Device: O2 Device: Room Air O2 Flow Rate:        Palliative Assessment/Data: 30%   Palliative Care Assessment & Plan   Patient Profile: 89 y.o. female  with past medical history of paroxysmal atrial fibrillation, chronic systolic/diastolic heart failure, moderate mitral regurgitation, hyperlipidemia, anemia of chronic disease associated baseline hemoglobin range 10-12, admitted on 09/02/2023 with  tachycardia after presenting from outpatient heart  failure clinic to [facility] ED for evaluation.    Her most recent echocardiogram occurred on 08/21/2023, which was notable for LVEF 40 to 45%, severe left ventricular hypertrophy, grade 2 diastolic dysfunction, normal right ventricular systolic function, severely dilated left atrium, and moderate mitral regurgitation.   Patient was admitted for further evaluation of tachycardia and concern for acute cystitis.  She is also being treated for community-acquired pneumonia.   PMT has been consulted to assist with goals of care conversation.    Assessment: Goals of care conversation Multifocal atrial tachycardia/paroxysmal A-fib CAP UTI Demand ischemia  Ambulatory dysfunction  Recommendations/Plan: Continue DNR/DNI Continue current care plan MOST form completed today. Will scan copy into EMR and placed original form in hard chart. Provided family with copies as well Spiritual care consult for assistance with advance directives appreciated. Patient and family would like to name daughter Holly Nunez as HCPOA Outpatient palliative care referral was requested, TOC assistance  is appreciated PMT remains available as needed   Prognosis: Poor, would not be surprising if she would be appropriate for hospice sooner rather than later   Discharge Planning: Skilled Nursing Facility for rehab with Palliative care service follow-up  Care plan was discussed with patient, patient's daughter Holly Nunez    MDM high         Holly Krass Alroy Jericho, PA-C  Palliative Medicine Team Team phone # [phone]  Thank you for allowing the Palliative Medicine Team to assist in the care of this patient. Please utilize secure chat with additional questions, if there is no response within 30 minutes please call the above phone number.  Palliative Medicine Team providers are available by phone from 7am to 7pm daily and can be reached through the team cell phone.  Should this patient require assistance outside of these hours, please call the patient's attending physician.

## 2023-09-06 NOTE — TOC Progression Note (Addendum)
 Transition of Care [facility]) - Progression Note    Patient Details  Name: Lataunya Ruud MRN: [id number] Date of Birth: 21-Jan-1925  Transition of Care [facility]) CM/SW Contact  Carmon Christen, LCSWA Phone Number: 09/06/2023, 10:25 AM  Clinical Narrative:     CSW received consult for referral for outpatient palliative services to follow patient at SNF. CSW spoke with patients daughter Amos Balint. [name] agreeable for CSW to make referral to [facility] for palliative services to follow patient at SNF. CSW made referral to Shawn with [facility] for palliative services to follow patient at SNF.  Update- CSW spoke with Nicki with [employer] who confirmed patient can return today if medically stable. CSW informed MD.    Barriers to Discharge: No Barriers Identified  Expected Discharge Plan and Services                                               Social Determinants of Health (SDOH) Interventions SDOH Screenings   Food Insecurity: No Food Insecurity (09/02/2023)  Housing: Low Risk  (09/02/2023)  Transportation Needs: No Transportation Needs (09/02/2023)  Utilities: Not At Risk (09/02/2023)  Alcohol  Screen: Low Risk  (01/29/2021)  Depression (PHQ2-9): Low Risk  (01/29/2021)  Financial Resource Strain: Low Risk  (01/29/2021)  Physical Activity: Inactive (01/29/2021)  Social Connections: Socially Isolated (09/02/2023)  Stress: No Stress Concern Present (01/29/2021)  Tobacco Use: Low Risk  (09/03/2023)    Readmission Risk Interventions     No data to display

## 2023-09-06 NOTE — TOC Transition Note (Signed)
 Transition of Care [facility]) - Discharge Note   Patient Details  Name: Holly Nunez MRN: [id number] Date of Birth: 07/16/1924  Transition of Care [facility]) CM/SW Contact:  Carmon Christen, LCSWA Phone Number: 09/06/2023, 11:31 AM   Clinical Narrative:     Patient will DC to: [facility]  Anticipated DC date: 09/06/2023  Family notified: [name]  Transport by: Lyna Sandhoff  ?  Per MD patient ready for DC to [facility] with palliative services to follow. RN, patient, patient's family, Shawn with [facility],and facility notified of DC. Discharge Summary sent to facility. RN given number for report [phone] [id number]. DC packet on chart. DNR signed by MD attached to patients DC packet. Ambulance transport requested for patient.  CSW signing off.   Final next level of care: Skilled Nursing Facility Barriers to Discharge: No Barriers Identified   Patient Goals and CMS Choice     Choice offered to / list presented to : Adult Children (daughter Amos Balint)      Discharge Placement              Patient chooses bed at: [facility] Patient to be transferred to facility by: [facility] Name of family member notified: [name] Patient and family notified of of transfer: 09/06/23  Discharge Plan and Services Additional resources added to the After Visit Summary for                                       Social Drivers of Health (SDOH) Interventions SDOH Screenings   Food Insecurity: No Food Insecurity (09/02/2023)  Housing: Low Risk  (09/02/2023)  Transportation Needs: No Transportation Needs (09/02/2023)  Utilities: Not At Risk (09/02/2023)  Alcohol  Screen: Low Risk  (01/29/2021)  Depression (PHQ2-9): Low Risk  (01/29/2021)  Financial Resource Strain: Low Risk  (01/29/2021)  Physical Activity: Inactive (01/29/2021)  Social Connections: Socially Isolated (09/02/2023)  Stress: No Stress Concern Present (01/29/2021)  Tobacco Use: Low Risk  (09/03/2023)     Readmission Risk  Interventions     No data to display

## 2023-09-06 NOTE — NC FL2 (Signed)
 [place]  MEDICAID [id number] LEVEL OF CARE FORM     IDENTIFICATION  Patient Name: Holly Nunez Birthdate: [birth year] Sex: female Admission Date (Current Location): 09/02/2023  [facility] and [place] Number:  Producer, television/film/video and Address:  [facility], [address], [place], [place] [address]      Provider Number: [id number]  Attending Physician Name and Address:  Theadore Finger, MD  Relative Name and Phone Number:  Amos Balint (daughter) [phone]    Current Level of Care: Hospital Recommended Level of Care: Skilled Nursing Facility Prior Approval Number:    Date Approved/Denied:   PASRR Number: [id number]  Discharge Plan: SNF    Current Diagnoses: Patient Active Problem List   Diagnosis Date Noted   Atrial fibrillation with RVR (HCC) 09/03/2023   Multifocal atrial tachycardia (HCC) 09/03/2023   Elevated troponin 09/03/2023   Acute cystitis 09/03/2023   Chronic combined systolic and diastolic heart failure (HCC) 09/03/2023   Anemia of chronic disease 09/03/2023   Atrial fibrillation with rapid ventricular response (HCC) 08/20/2023   Hypokalemia 08/20/2023   Hypomagnesemia 08/20/2023   Pleural effusion, right 08/20/2023   Pressure injury of skin 07/16/2023   History of UTI 04/04/2023   Cognitive deficits 05/18/2021   Pancreatic mass 05/18/2021   Abnormal ECG 05/15/2021   CKD stage 3a, GFR 45-59 ml/min (HCC) 05/14/2021   Paroxysmal atrial fibrillation (HCC) 05/14/2021   Pancreatic pseudocyst/cyst 05/14/2021   History of TIA (transient ischemic attack) 04/16/2020   Dyspepsia 08/21/2019   Debility 06/21/2017   Great toe pain, left 12/02/2016   Vitamin D  deficiency 12/02/2016   Estrogen deficiency 05/27/2016   Overactive bladder 12/07/2015   Allergy 12/07/2015   Multinodular goiter (nontoxic) 04/25/2015   History of chicken pox    Essential hypertension 02/19/2014   HLD (hyperlipidemia) 02/19/2014   DJD (degenerative joint disease)  02/19/2014   S/P hysterectomy 02/19/2014   Normocytic anemia 02/19/2014   Urinary incontinence 02/19/2014    Orientation RESPIRATION BLADDER Height & Weight     Self, Place  Normal Incontinent Weight: 118 lb 2.7 oz (53.6 kg) Height:  5' (152.4 cm)  BEHAVIORAL SYMPTOMS/MOOD NEUROLOGICAL BOWEL NUTRITION STATUS      Incontinent Diet (Please see discharge summary)  AMBULATORY STATUS COMMUNICATION OF NEEDS Skin   Extensive Assist Verbally Other (Comment) (Abrasion leg,Erythema,heel,Buttocks,Bil.,Wound/Incision LDAs,PI sacrum,R,L,mid stage 2, Wound/Incision open or dehisced skin tear, Leg,R,Lower,Lateral)                       Personal Care Assistance Level of Assistance  Feeding, Dressing, Bathing Bathing Assistance: Maximum assistance Feeding assistance: Maximum assistance Dressing Assistance: Maximum assistance     Functional Limitations Info  Sight, Hearing, Speech Sight Info: Adequate Hearing Info: Adequate Speech Info: Adequate    SPECIAL CARE FACTORS FREQUENCY                       Contractures Contractures Info: Not present    Additional Factors Info  Code Status, Allergies, Isolation Precautions Code Status Info: DNR Allergies Info: Alphagan  (brimonidine )     Isolation Precautions Info: VRE,MRSA     Current Medications (09/06/2023):  This is the current hospital active medication list Current Facility-Administered Medications  Medication Dose Route Frequency Provider Last Rate Last Admin   acetaminophen  (TYLENOL ) tablet 650 mg  650 mg Oral Q6H WA Gonfa, Taye T, MD   650 mg at 09/06/23 0858   amoxicillin  (AMOXIL ) capsule 500 mg  500 mg Oral  Q12H Gonfa, Taye T, MD   500 mg at 09/06/23 1610   aspirin  EC tablet 81 mg  81 mg Oral q AM Howerter, Justin B, DO   81 mg at 09/06/23 9604   atorvastatin  (LIPITOR) tablet 20 mg  20 mg Oral q AM Howerter, Justin B, DO   20 mg at 09/06/23 5409   Chlorhexidine  Gluconate Cloth 2 % PADS 6 each  6 each Topical Daily  Gonfa, Taye T, MD       feeding supplement (ENSURE ENLIVE / ENSURE PLUS) liquid 237 mL  237 mL Oral BID BM Gonfa, Taye T, MD   237 mL at 09/06/23 0930   melatonin tablet 3 mg  3 mg Oral QHS PRN Howerter, Justin B, DO   3 mg at 09/03/23 2308   metoprolol  tartrate (LOPRESSOR ) injection 2.5 mg  2.5 mg Intravenous Q4H PRN Howerter, Justin B, DO   2.5 mg at 09/03/23 0350   metoprolol  tartrate (LOPRESSOR ) tablet 12.5 mg  12.5 mg Oral Once Gonfa, Taye T, MD       metoprolol  tartrate (LOPRESSOR ) tablet 25 mg  25 mg Oral Q6H Gonfa, Taye T, MD   25 mg at 09/06/23 0119   midodrine  (PROAMATINE ) tablet 7.5 mg  7.5 mg Oral TID WC Gonfa, Taye T, MD   7.5 mg at 09/06/23 8119   mupirocin  ointment (BACTROBAN ) 2 %   Nasal BID Gonfa, Taye T, MD       ondansetron  (ZOFRAN ) injection 4 mg  4 mg Intravenous Q6H PRN Howerter, Justin B, DO       Oral care mouth rinse  15 mL Mouth Rinse PRN Howerter, Justin B, DO       polyethylene glycol (MIRALAX  / GLYCOLAX ) packet 17 g  17 g Oral BID PRN Gonfa, Taye T, MD       senna-docusate (Senokot-S) tablet 1 tablet  1 tablet Oral BID PRN Gonfa, Taye T, MD       torsemide  (DEMADEX ) tablet 20 mg  20 mg Oral QODAY Gonfa, Taye T, MD   20 mg at 09/06/23 1478     Discharge Medications: Please see discharge summary for a list of discharge medications.  Relevant Imaging Results:  Relevant Lab Results:   Additional Information [id number]  Carmon Christen, LCSWA

## 2023-09-06 NOTE — Progress Notes (Signed)
 Arlin Benes [phone] Liaison Note:  Notified by [facility] manager of patient/family request for [facility] Palliative services at home after discharge.   Please call with any hospice or outpatient palliative care related questions.   Thank you for the opportunity to participate in this patient's care.   Madelene Schanz, BSN, RN, OCN [employer] [phone]

## 2023-09-06 NOTE — Plan of Care (Signed)
Patient discharged to SNF.

## 2023-09-06 NOTE — Plan of Care (Signed)
  Problem: Education: Goal: Knowledge of General Education information will improve Description: Including pain rating scale, medication(s)/side effects and non-pharmacologic comfort measures Outcome: Progressing   Problem: Clinical Measurements: Goal: Ability to maintain clinical measurements within normal limits will improve Outcome: Progressing Goal: Will remain free from infection Outcome: Progressing Goal: Diagnostic test results will improve Outcome: Progressing Goal: Respiratory complications will improve Outcome: Progressing Goal: Cardiovascular complication will be avoided Outcome: Progressing   Problem: Coping: Goal: Level of anxiety will decrease Outcome: Progressing   Problem: Elimination: Goal: Will not experience complications related to bowel motility Outcome: Progressing Goal: Will not experience complications related to urinary retention Outcome: Progressing   Problem: Pain Managment: Goal: General experience of comfort will improve and/or be controlled Outcome: Progressing   Problem: Safety: Goal: Ability to remain free from injury will improve Outcome: Progressing   Problem: Skin Integrity: Goal: Risk for impaired skin integrity will decrease Outcome: Progressing   Problem: Health Behavior/Discharge Planning: Goal: Ability to manage health-related needs will improve Outcome: Not Progressing   Problem: Activity: Goal: Risk for activity intolerance will decrease Outcome: Not Progressing   Problem: Nutrition: Goal: Adequate nutrition will be maintained Outcome: Not Progressing

## 2023-09-06 NOTE — Discharge Summary (Signed)
 Physician Discharge Summary  Holly Nunez WUJ:[id number] DOB: July 13, 1924 DOA: 09/02/2023  PCP: Default, Provider, MD  Admit date: 09/02/2023 Discharge date: 09/06/23  Admitted From: SNF Disposition: SNF Recommendations for Outpatient Follow-up:  Outpatient follow-up with cardiology in 1 to 2 weeks Check CMP and CBC in 1 week Recommend palliative follow-up at SNF Please follow up on the following pending results: None   Discharge Condition: Stable CODE STATUS: DNR   Hospital course 89 year old F with PMH of combined CHF, moderate MR, PAF not on anticoagulation due to fall risk, anemia, HLD and ambulatory dysfunction sent to ED from heart failure clinic due to tachycardia with HR in 180s to 190s, and admitted with working diagnosis of multifocal atrial tachycardia.  Troponin was slightly elevated to 101 and 103.  BNP was 420 with seem to be lower than prior value.  UA with pyuria but patient without UTI symptoms.  RVP negative.  CXR suggested left retrocardiac opacity with small bilateral effusion.  Urine culture obtained.  Started on IV ceftriaxone  for possible UTI and pneumonia.  Cardiology consulted and recommended metoprolol  to every 6 hours.    Patient remained tachycardic with HR 120s.  Soft blood pressure.  Cardiology gave recommendation and signed off.  However, she remains tachycardic with soft blood pressure.  Eventually, heart rate improved with adjustment of beta-blocker, addition of midodrine  and digoxin .   On the day of discharge, heart rate in the range of 80s to 100.  Blood pressure within normal.  Patient was symptomatic.  Continued on IV ceftriaxone  for possible UTI and pneumonia.  Urine culture with VRE.  Antibiotic changed to amoxicillin .  She is discharged on amoxicillin  for 3 more days.  Outpatient follow-up with cardiology in 1 to 2 weeks.  Patient seen by palliative while in-house.  Recommend palliative follow-up at SNF.  See individual problem list below  for more.   Problems addressed during this hospitalization Multifocal atrial tachycardia/paroxysmal A-fib-sent to ED from heart failure clinic with heart rate of 180-190s.  Unclear etiology.  Doubt PE without respiratory distress or symptoms.  Mildly elevated troponin likely demand ischemia.  BNP better than baseline.  UA with pyuria.  CXR raises concern for left retrocardiac opacity.  TSH normal. -Cardiology recommended continuing metoprolol  25 mg every 6 hours and signed off  -Started midodrine  7.5 mg 3 times daily daily due to hypotension -Added digoxin  for better BP and heart rate control. -Treat UTI and possible pneumonia as below - Outpatient follow-up with cardiology   Chronic combined CHF: TTE on 4/6 with LVEF of 40 to 45%, G2 DD, severe LVH, severe LAE, moderate MR.  On torsemide  20 mg daily at home.  Appears euvolemic on exam.  No respiratory distress. -Cardiology recommended decreasing torsemide  to every other day.    Community-acquired pneumonia: Chest x-ray suggested retrocardiac opacity.  Has no fever or leukocytosis but mildly elevated Pro-Cal to 0.99 - Continue IV ceftriaxone >> p.o. amoxicillin  4/20-4/25   Urinary tract infection:: UA with pyuria.  Urine culture with VRE.  Denies UTI symptoms but not reliable historian - P.o. amoxicillin  as above.   Elevated troponin/demand ischemia: Likely demand ischemia in the setting of #1 -Management as above   Hypotension: Resolved. - Midodrine  and digoxin  as above.   Moderate MR-noted on recent TTE.   Anemia of chronic disease: Stable -Continue monitoring   Ambulatory dysfunction: Lately bedbound.  Per daughter, was able to ambulate with rolling walker before she went to SNF. -PT/OT-recommended to return to SNF   Goal of  care discussion: DNR limited appropriate.  Patient is frail with advanced age and significant comorbidity as above.  Seen by palliative.  Recommend outpatient follow-up   Hypomagnesemia: Resolved    Constipation - Continue bowel regimen       Sacral pressure injury: Present on arrival. Pressure Injury 07/12/23 Sacrum Stage 2 -  Partial thickness loss of dermis presenting as a shallow open injury with a red, pink wound bed without slough. open skin, redness (Active)  07/12/23 1842  Location: Sacrum  Location Orientation:   Staging: Stage 2 -  Partial thickness loss of dermis presenting as a shallow open injury with a red, pink wound bed without slough.  Wound Description (Comments): open skin, redness  Present on Admission:      Pressure Injury 09/03/23 Sacrum Right;Left;Mid Stage 2 -  Partial thickness loss of dermis presenting as a shallow open injury with a red, pink wound bed without slough. (Active)  09/03/23 0730  Location: Sacrum  Location Orientation: Right;Left;Mid  Staging: Stage 2 -  Partial thickness loss of dermis presenting as a shallow open injury with a red, pink wound bed without slough.  Wound Description (Comments):   Present on Admission: Yes  Dressing Type Foam - Lift dressing to assess site every shift 09/04/23 2048    Time spent 35 minutes  Vital signs Vitals:   09/06/23 0215 09/06/23 0315 09/06/23 0415 09/06/23 0515  BP: 111/77 92/61 97/75  91/77  Pulse: (!) 109 (!) 107 (!) 109 (!) 115  Temp:   97.8 F (36.6 C)   Resp:   12   Height:      Weight:      SpO2: 95% 99% 97% 98%  TempSrc:   Axillary   BMI (Calculated):         Discharge exam  GENERAL: No apparent distress.  Appears frail. HEENT: MMM.  Vision and hearing grossly intact.  NECK: Supple.  No apparent JVD.  RESP:  No IWOB.  Fair aeration bilaterally. CVS: Irregular rhythm.  HR ranges from 80s to 100. Heart sounds normal.  ABD/GI/GU: BS+. Abd soft, NTND.  MSK/EXT:  Moves extremities but very weak. SKIN: Stage II pressure injury. NEURO: Awake and alert. Oriented fairly.  No apparent focal neuro deficit. PSYCH: Calm. Normal affect.   Discharge Instructions Discharge Instructions      Diet general   Complete by: As directed    Discharge wound care:   Complete by: As directed    Pressure Injury 07/12/23 Sacrum Stage 2 -  Partial thickness loss of dermis presenting as a shallow open injury with a red, pink wound bed without slough. open skin, redness 55 days   Pressure Injury 09/03/23 Sacrum Right;Left;Mid Stage 2 -  Partial thickness loss of dermis presenting as a shallow open injury with a red, pink wound bed without slough.  Recommendation - Foam dressing - Frequent turning - Close monitoring   Increase activity slowly   Complete by: As directed       Allergies as of 09/06/2023       Reactions   Alphagan  [brimonidine ] Other (See Comments)   Secondary infection and vision changes. Allergy not listed on MAR         Medication List     TAKE these medications    acetaminophen  650 MG CR tablet Commonly known as: TYLENOL  Take 1 tablet (650 mg total) by mouth in the morning, at noon, and at bedtime. May also give 2 tablets every 6 hours as needed for pain  amoxicillin  500 MG capsule Commonly known as: AMOXIL  Take 1 capsule (500 mg total) by mouth every 12 (twelve) hours for 3 days.   ASPERCREME EX Apply 1 application  topically in the morning and at bedtime. Apply to both legs   aspirin  EC 81 MG tablet Take 81 mg by mouth in the morning.   atorvastatin  20 MG tablet Commonly known as: LIPITOR Take 20 mg by mouth in the morning.   Biofreeze Cool The Pain 4 % Gel Generic drug: Menthol  (Topical Analgesic) Apply 1 Application topically 3 (three) times daily. Apply topically to neck   Combigan  0.2-0.5 % ophthalmic solution Generic drug: brimonidine -timolol  Place 1 drop into both eyes in the morning and at bedtime.   Dextran 70-Hypromellose 0.1-0.3 % Soln Apply 2 drops to eye every 12 (twelve) hours as needed (dry, itchy, irritated eyes).   digoxin  0.125 MG tablet Commonly known as: Lanoxin  Take 0.5 tablets (0.0625 mg total) by mouth daily.    folic acid  1 MG tablet Commonly known as: FOLVITE  Take 1 mg by mouth in the morning.   latanoprost  0.005 % ophthalmic solution Commonly known as: XALATAN  Place 1 drop into both eyes at bedtime.   melatonin 5 MG Tabs Take 1 tablet (5 mg total) by mouth at bedtime as needed.   metoprolol  tartrate 25 MG tablet Commonly known as: LOPRESSOR  Take 1 tablet (25 mg total) by mouth every 6 (six) hours. What changed: when to take this   midodrine  2.5 MG tablet Commonly known as: PROAMATINE  Take 3 tablets (7.5 mg total) by mouth 3 (three) times daily with meals.   mirtazapine 7.5 MG tablet Commonly known as: REMERON Take 7.5 mg by mouth at bedtime.   multivitamin with minerals Tabs tablet Take 1 tablet by mouth in the morning.   nitroGLYCERIN  0.4 MG SL tablet Commonly known as: NITROSTAT  Place 1 tablet (0.4 mg total) under the tongue every 5 (five) minutes as needed for chest pain.   NON FORMULARY Take 120 mLs by mouth in the morning, at noon, and at bedtime. Med pass   Omega 3 1000 MG Caps Take 1,000 mg by mouth in the morning.   ondansetron  4 MG tablet Commonly known as: ZOFRAN  Take 4 mg by mouth every 8 (eight) hours as needed for nausea or vomiting.   polyethylene glycol 17 g packet Commonly known as: MIRALAX  / GLYCOLAX  Take 17 g by mouth in the morning.   potassium chloride  10 MEQ tablet Commonly known as: KLOR-CON  Take 10 mEq by mouth at bedtime.   senna-docusate 8.6-50 MG tablet Commonly known as: Senokot-S Take 1 tablet by mouth 2 (two) times daily.   torsemide  20 MG tablet Commonly known as: DEMADEX  Take 1 tablet (20 mg total) by mouth every other day. Start taking on: September 08, 2023 What changed: when to take this   UNABLE TO FIND Take 120 mLs by mouth in the morning, at noon, and at bedtime. Med Name: [facility]               Discharge Care Instructions  (From admission, onward)           Start     Ordered   09/06/23 0000  Discharge  wound care:       Comments: Pressure Injury 07/12/23 Sacrum Stage 2 -  Partial thickness loss of dermis presenting as a shallow open injury with a red, pink wound bed without slough. open skin, redness 55 days   Pressure Injury 09/03/23 Sacrum Right;Left;Mid Stage  2 -  Partial thickness loss of dermis presenting as a shallow open injury with a red, pink wound bed without slough.  Recommendation - Foam dressing - Frequent turning - Close monitoring   09/06/23 1101            Consultations: Cardiology  Procedures/Studies:   DG Chest Portable 1 View Result Date: 09/02/2023 CLINICAL DATA:  Shortness of breath EXAM: PORTABLE CHEST 1 VIEW COMPARISON:  Chest radiograph dated 08/20/2023 FINDINGS: Normal lung volumes. Dense left retrocardiac opacity. Suspected layering bilateral pleural effusions. No pneumothorax. The heart size and mediastinal contours are within normal limits. No acute osseous abnormality. Surgical clips along the left neck. IMPRESSION: 1. Dense left retrocardiac opacity, which may represent atelectasis, aspiration, or pneumonia. 2. Suspected layering bilateral pleural effusions. Electronically Signed   By: Limin  Xu M.D.   On: 09/02/2023 14:26   ECHOCARDIOGRAM COMPLETE Result Date: 08/21/2023    ECHOCARDIOGRAM REPORT   Patient Name:   JOSALYNN JOHNDROW Date of Exam: 08/21/2023 Medical Rec #:  [id number]      Height:       60.0 in Accession #:    [id number]     Weight:       113.1 lb Date of Birth:  May 01, 1925     BSA:          1.465 m Patient Age:    89 years       BP:           105/59 mmHg Patient Gender: F              HR:           65 bpm. Exam Location:  Inpatient Procedure: 2D Echo, Cardiac Doppler and Color Doppler (Both Spectral and Color            Flow Doppler were utilized during procedure). Indications:    I50.40* Unspecified combined systolic (congestive) and diastolic                 (congestive) heart failure  History:        Patient has prior history of Echocardiogram  examinations, most                 recent 05/15/2024. Abnormal ECG, TIA, Arrythmias:Atrial                 Fibrillation, Signs/Symptoms:Altered Mental Status, Dyspnea and                 Shortness of Breath; Risk Factors:Hypertension and Dyslipidemia.  Sonographer:    Raynelle Callow RDCS Referring Phys: [id number] CAROLE N HALL IMPRESSIONS  1. Left ventricular ejection fraction, by estimation, is 40 to 45%. The left ventricle has mildly decreased function. The left ventricle demonstrates global hypokinesis. There is severe left ventricular hypertrophy. Left ventricular diastolic parameters  are consistent with Grade II diastolic dysfunction (pseudonormalization).  2. Right ventricular systolic function is normal. The right ventricular size is normal.  3. Left atrial size was severely dilated.  4. Large pleural effusion in the left lateral region.  5. The mitral valve is normal in structure. Moderate mitral valve regurgitation. No evidence of mitral stenosis. Moderate mitral annular calcification.  6. The aortic valve is normal in structure. There is mild calcification of the aortic valve. There is mild thickening of the aortic valve. Aortic valve regurgitation is not visualized. Aortic valve sclerosis is present, with no evidence of aortic valve stenosis.  7. The inferior vena cava is normal in size with  greater than 50% respiratory variability, suggesting right atrial pressure of 3 mmHg.  8. Evidence of atrial level shunting detected by color flow Doppler. FINDINGS  Left Ventricle: Left ventricular ejection fraction, by estimation, is 40 to 45%. The left ventricle has mildly decreased function. The left ventricle demonstrates global hypokinesis. The left ventricular internal cavity size was normal in size. There is  severe left ventricular hypertrophy. Left ventricular diastolic parameters are consistent with Grade II diastolic dysfunction (pseudonormalization). Right Ventricle: The right ventricular size is normal. No  increase in right ventricular wall thickness. Right ventricular systolic function is normal. Left Atrium: Left atrial size was severely dilated. Right Atrium: Right atrial size was normal in size. Pericardium: There is no evidence of pericardial effusion. Mitral Valve: The mitral valve is normal in structure. Moderate mitral annular calcification. Moderate mitral valve regurgitation. No evidence of mitral valve stenosis. MV peak gradient, 5.7 mmHg. The mean mitral valve gradient is 2.0 mmHg. Tricuspid Valve: The tricuspid valve is normal in structure. Tricuspid valve regurgitation is mild . No evidence of tricuspid stenosis. Aortic Valve: The aortic valve is normal in structure. There is mild calcification of the aortic valve. There is mild thickening of the aortic valve. Aortic valve regurgitation is not visualized. Aortic valve sclerosis is present, with no evidence of aortic valve stenosis. Pulmonic Valve: The pulmonic valve was normal in structure. Pulmonic valve regurgitation is not visualized. No evidence of pulmonic stenosis. Aorta: The aortic root is normal in size and structure. Venous: The inferior vena cava is normal in size with greater than 50% respiratory variability, suggesting right atrial pressure of 3 mmHg. IAS/Shunts: Evidence of atrial level shunting detected by color flow Doppler. Additional Comments: There is a large pleural effusion in the left lateral region.  LEFT VENTRICLE PLAX 2D LVIDd:         3.00 cm LVIDs:         2.60 cm LV PW:         2.10 cm LV IVS:        1.50 cm LVOT diam:     1.80 cm LV SV:         32 LV SV Index:   22 LVOT Area:     2.54 cm  LV Volumes (MOD) LV vol d, MOD A2C: 47.7 ml LV vol d, MOD A4C: 49.3 ml LV vol s, MOD A2C: 33.3 ml LV vol s, MOD A4C: 27.3 ml LV SV MOD A2C:     14.4 ml LV SV MOD A4C:     49.3 ml LV SV MOD BP:      20.3 ml RIGHT VENTRICLE            IVC RV S prime:     6.64 cm/s  IVC diam: 1.30 cm TAPSE (M-mode): 1.6 cm LEFT ATRIUM             Index         RIGHT ATRIUM          Index LA diam:        3.20 cm 2.18 cm/m   RA Area:     7.77 cm LA Vol (A2C):   32.6 ml 22.25 ml/m  RA Volume:   12.60 ml 8.60 ml/m LA Vol (A4C):   49.5 ml 33.79 ml/m LA Biplane Vol: 40.7 ml 27.78 ml/m  AORTIC VALVE LVOT Vmax:   61.30 cm/s LVOT Vmean:  38.300 cm/s LVOT VTI:    0.124 m  AORTA Ao Root diam: 3.20 cm  Ao Asc diam:  3.50 cm MITRAL VALVE                TRICUSPID VALVE MV Area (PHT): 4.31 cm     TR Peak grad:   27.5 mmHg MV Area VTI:   1.01 cm     TR Vmax:        262.00 cm/s MV Peak grad:  5.7 mmHg MV Mean grad:  2.0 mmHg     SHUNTS MV Vmax:       1.19 m/s     Systemic VTI:  0.12 m MV Vmean:      62.5 cm/s    Systemic Diam: 1.80 cm MV Decel Time: 176 msec MV E velocity: 108.00 cm/s MV A velocity: 52.20 cm/s MV E/A ratio:  2.07 Dorothye Gathers MD Electronically signed by Dorothye Gathers MD Signature Date/Time: 08/21/2023/2:46:47 PM    Final    US  THORACENTESIS ASP PLEURAL SPACE W/IMG GUIDE Result Date: 08/20/2023 INDICATION: 89 year old female. History of CHF, A fib. Presented to the ED with shortness of breath. Found to have a right-sided pleural effusion. Request is for therapeutic and diagnostic thoracentesis EXAM: ULTRASOUND GUIDED THERAPEUTIC AND DIAGNOSTIC RIGHT-SIDED THORACENTESIS MEDICATIONS: LIDOCAINE  1% 10 ML COMPLICATIONS: None immediate. PROCEDURE: An ultrasound guided thoracentesis was thoroughly discussed with the patient and questions answered. The benefits, risks, alternatives and complications were also discussed. The patient understands and wishes to proceed with the procedure. Written consent was obtained. Ultrasound was performed to localize and mark an adequate pocket of fluid in the right chest. The area was then prepped and draped in the normal sterile fashion. 1% Lidocaine  was used for local anesthesia. Under ultrasound guidance a 6 Fr Safe-T-Centesis catheter was introduced. Thoracentesis was performed. The catheter was removed and a dressing applied. FINDINGS: A  total of approximately 550 ML of cloudy yellow fluid was removed. Samples were sent to the laboratory as requested by the clinical team. IMPRESSION: Successful ultrasound guided therapeutic and diagnostic right-sided thoracentesis yielding 550 mL of pleural fluid. Performed by Reagan Camera NP Electronically Signed   By: Myrlene Asper D.O.   On: 08/20/2023 14:23   DG Chest 1 View Result Date: 08/20/2023 CLINICAL DATA:  [id number] S/P thoracentesis [id number] EXAM: CHEST  1 VIEW COMPARISON:  Chest x-ray 08/19/2023, ultrasound thoracentesis 08/20/2023 FINDINGS: The heart and mediastinal contours are unchanged. Atherosclerotic plaque. No focal consolidation. No pulmonary edema. At least small left pleural effusion. At least trace right pleural effusion. No pneumothorax. No acute osseous abnormality. IMPRESSION: 1.  At least small left pleural effusion. 2. At least trace right pleural effusion. Electronically Signed   By: Morgane  Naveau M.D.   On: 08/20/2023 13:01   DG Chest Port 1 View Result Date: 08/20/2023 CLINICAL DATA:  Shortness of breath EXAM: PORTABLE CHEST 1 VIEW COMPARISON:  05/14/2021 FINDINGS: Mild cardiomegaly with small left and small moderate right pleural effusion. Bibasilar airspace disease. Aortic atherosclerosis. No pneumothorax IMPRESSION: Mild cardiomegaly with small left and small to moderate right pleural effusions. Bibasilar airspace disease, atelectasis versus pneumonia. Electronically Signed   By: Esmeralda Hedge M.D.   On: 08/20/2023 00:07       The results of significant diagnostics from this hospitalization (including imaging, microbiology, ancillary and laboratory) are listed below for reference.     Microbiology: Recent Results (from the past 240 hours)  Resp panel by RT-PCR (RSV, Flu A&B, Covid) Anterior Nasal Swab     Status: None   Collection Time: 09/02/23 12:28 PM   Specimen: Anterior Nasal  Swab  Result Value Ref Range Status   SARS Coronavirus 2 by RT PCR NEGATIVE  NEGATIVE Final   Influenza A by PCR NEGATIVE NEGATIVE Final   Influenza B by PCR NEGATIVE NEGATIVE Final    Comment: (NOTE) The Xpert Xpress SARS-CoV-2/FLU/RSV plus assay is intended as an aid in the diagnosis of influenza from Nasopharyngeal swab specimens and should not be used as a sole basis for treatment. Nasal washings and aspirates are unacceptable for Xpert Xpress SARS-CoV-2/FLU/RSV testing.  Fact Sheet for Patients: BloggerCourse.com  Fact Sheet for Healthcare Providers: SeriousBroker.it  This test is not yet approved or cleared by the [place]  FDA and has been authorized for detection and/or diagnosis of SARS-CoV-2 by FDA under an Emergency Use Authorization (EUA). This EUA will remain in effect (meaning this test can be used) for the duration of the COVID-19 declaration under Section 564(b)(1) of the Act, 21 U.S.C. section 360bbb-3(b)(1), unless the authorization is terminated or revoked.     Resp Syncytial Virus by PCR NEGATIVE NEGATIVE Final    Comment: (NOTE) Fact Sheet for Patients: BloggerCourse.com  Fact Sheet for Healthcare Providers: SeriousBroker.it  This test is not yet approved or cleared by the [place]  FDA and has been authorized for detection and/or diagnosis of SARS-CoV-2 by FDA under an Emergency Use Authorization (EUA). This EUA will remain in effect (meaning this test can be used) for the duration of the COVID-19 declaration under Section 564(b)(1) of the Act, 21 U.S.C. section 360bbb-3(b)(1), unless the authorization is terminated or revoked.  Performed at [facility], [address] [place], [place] [address]   Urine Culture     Status: Abnormal   Collection Time: 09/02/23  5:34 PM   Specimen: Urine, Random  Result Value Ref Range Status   Specimen Description URINE, RANDOM  Final   Special Requests   Final    NONE  Reflexed from [id number] Performed at [facility], [address] [place], [place] [address]    Culture (A)  Final    >=100,000 COLONIES/mL ENTEROCOCCUS FAECALIS VANCOMYCIN RESISTANT ENTEROCOCCUS ISOLATED    Report Status 09/05/2023 FINAL  Final   Organism ID, Bacteria ENTEROCOCCUS FAECALIS (A)  Final      Susceptibility   Enterococcus faecalis - MIC*    AMPICILLIN <=2 SENSITIVE Sensitive     NITROFURANTOIN <=16 SENSITIVE Sensitive     VANCOMYCIN >=32 RESISTANT Resistant     LINEZOLID 2 SENSITIVE Sensitive     * >=100,000 COLONIES/mL ENTEROCOCCUS FAECALIS  MRSA Next Gen by PCR, Nasal     Status: Abnormal   Collection Time: 09/05/23  4:25 PM   Specimen: Nasal Mucosa; Nasal Swab  Result Value Ref Range Status   MRSA by PCR Next Gen DETECTED (A) NOT DETECTED Final    Comment: RESULT CALLED TO, READ BACK BY AND VERIFIED WITH: Rheba Cedar RN @ 2250 09/05/2023 (NOTE) The GeneXpert MRSA Assay (FDA approved for NASAL specimens only), is one component of a comprehensive MRSA colonization surveillance program. It is not intended to diagnose MRSA infection nor to guide or monitor treatment for MRSA infections. Test performance is not FDA approved in patients less than 47 years old. Performed at [facility], [address] [place], [place] [address]      Labs:  CBC: Recent Labs  Lab 09/02/23 1353 09/03/23 0507 09/04/23 0549 09/05/23 0708  WBC 6.6 5.3 5.2 5.7  NEUTROABS 5.3 3.7  --   --   HGB 11.2* 10.2* 9.3*  10.0*  HCT 34.3* 30.6* 27.5* 29.9*  MCV 97.7 95.6 95.5 95.2  PLT 221 214 179 215   BMP &GFR Recent Labs  Lab 09/02/23 1228 09/03/23 0507 09/04/23 0549 09/05/23 0708  NA 135 137 138 139  K 4.3 4.3 3.6 4.1  CL 100 99 103 104  CO2 25 26 25 27   GLUCOSE 118* 95 110* 94  BUN 10 11 12 11   CREATININE 0.83 0.84 0.78 0.82  CALCIUM  8.6* 8.7* 8.5* 8.8*  MG  --  1.5* 1.9 1.8  PHOS  --   --  2.7 3.0   Estimated Creatinine Clearance: 28.8 mL/min (by C-G formula  based on SCr of 0.82 mg/dL). Liver & Pancreas: Recent Labs  Lab 09/02/23 1228 09/03/23 0507 09/04/23 0549 09/05/23 0708  AST 38 25  --   --   ALT 16 12  --   --   ALKPHOS 56 52  --   --   BILITOT 0.9 0.7  --   --   PROT 5.3* 5.1*  --   --   ALBUMIN  2.2* 2.1* 1.9* 2.1*   Recent Labs  Lab 09/02/23 1228  LIPASE 22   No results for input(s): "AMMONIA" in the last 168 hours. Diabetic: No results for input(s): "HGBA1C" in the last 72 hours. No results for input(s): "GLUCAP" in the last 168 hours. Cardiac Enzymes: No results for input(s): "CKTOTAL", "CKMB", "CKMBINDEX", "TROPONINI" in the last 168 hours. No results for input(s): "PROBNP" in the last 8760 hours. Coagulation Profile: No results for input(s): "INR", "PROTIME" in the last 168 hours. Thyroid  Function Tests: No results for input(s): "TSH", "T4TOTAL", "FREET4", "T3FREE", "THYROIDAB" in the last 72 hours. Lipid Profile: No results for input(s): "CHOL", "HDL", "LDLCALC", "TRIG", "CHOLHDL", "LDLDIRECT" in the last 72 hours. Anemia Panel: No results for input(s): "VITAMINB12", "FOLATE", "FERRITIN", "TIBC", "IRON", "RETICCTPCT" in the last 72 hours. Urine analysis:    Component Value Date/Time   COLORURINE AMBER (A) 09/02/2023 1734   APPEARANCEUR CLOUDY (A) 09/02/2023 1734   LABSPEC 1.018 09/02/2023 1734   PHURINE 6.0 09/02/2023 1734   GLUCOSEU NEGATIVE 09/02/2023 1734   HGBUR NEGATIVE 09/02/2023 1734   BILIRUBINUR NEGATIVE 09/02/2023 1734   KETONESUR NEGATIVE 09/02/2023 1734   PROTEINUR 100 (A) 09/02/2023 1734   NITRITE NEGATIVE 09/02/2023 1734   LEUKOCYTESUR LARGE (A) 09/02/2023 1734   Sepsis Labs: Invalid input(s): "PROCALCITONIN", "LACTICIDVEN"   SIGNED:  Theadore Finger, MD  [facility] 09/06/2023, 11:02 AM

## 2023-09-06 NOTE — Care Management Important Message (Signed)
 Important Message  Patient Details  Name: Holly Nunez MRN: [id number] Date of Birth: Jan 05, 1925   Important Message Given:  Yes - Medicare IM     Janith Melnick 09/06/2023, 12:04 PM

## 2023-09-28 NOTE — Progress Notes (Unsigned)
 Cardiology Clinic Note   Patient Name: Holly Nunez Date of Encounter: 09/30/2023  Primary Care Provider:  Default, Provider, MD Primary Cardiologist:  Alexandria Angel, MD  Patient Profile    Holly Nunez 89 year old female presents the clinic today for follow-up evaluation of her atrial fibrillation and hypertension.  Past Medical History    Past Medical History:  Diagnosis Date   Anemia    Arthritis    CKD (chronic kidney disease), stage III (HCC)    3B   Coronary artery disease    Dysphagia    pureed diet   Glaucoma    Hyperlipidemia    Hypertension    OAB (overactive bladder)    PAF (paroxysmal atrial fibrillation) (HCC)    Patent foramen ovale    Psoas hematoma, left, secondary to anticoagulant therapy 05/14/2021   Thrombocytopenia (HCC)    Thyroid  nodule    Biopsy-negative   TIA (transient ischemic attack)    Urolithiasis 07/12/2023   Past Surgical History:  Procedure Laterality Date   ABDOMINAL HYSTERECTOMY     fibroid   APPENDECTOMY     CYSTOSCOPY W/ URETERAL STENT PLACEMENT Right 07/18/2023   Procedure: CYSTOSCOPY WITH RETROGRADE PYELOGRAM/URETERAL STENT PLACEMENT;  Surgeon: Mellie Sprinkle., MD;  Location: [facility];  Service: Urology;  Laterality: Right;   CYSTOSCOPY WITH RETROGRADE PYELOGRAM, URETEROSCOPY AND STENT PLACEMENT Right 03/19/2023   Procedure: CYSTO-URETEROSCOPY; RIGHT URETERAL STENT PLACEMENT;  Surgeon: Mellie Sprinkle., MD;  Location: [facility] OR;  Service: Urology;  Laterality: Right;   EYE SURGERY Bilateral    CATARACT REMOVAL   JOINT REPLACEMENT     REPLACEMENT TOTAL KNEE Right 2007   THYROID  LOBECTOMY Left 04/25/2015   THYROID  LOBECTOMY Left 04/25/2015   Procedure: LEFT THYROID  LOBECTOMY;  Surgeon: Oralee Billow, MD;  Location: [facility] OR;  Service: General;  Laterality: Left;   TRIGGER FINGER RELEASE  2007   URETEROSCOPY WITH HOLMIUM LASER LITHOTRIPSY Right 07/18/2023   Procedure: URETEROSCOPY WITH HOLMIUM LASER LITHOTRIPSY;  Surgeon:  Mellie Sprinkle., MD;  Location: [facility];  Service: Urology;  Laterality: Right;    Allergies  Allergies  Allergen Reactions   Alphagan  [Brimonidine ] Other (See Comments)    Secondary infection and vision changes. Allergy not listed on MAR     History of Present Illness    Tyjai Overcash has a PMH of combined CHF, moderate MR, paroxysmal atrial fibrillation (not on anticoagulation due to fall risk, anemia, hyperlipidemia, and PFO.  Her PMH also includes TIAs.  She was previously on aspirin  and Plavix.  She had recurrent TIA and was placed on low-dose Xarelto .  She was noted to have coronary calcifications on CT scan 7/16.  She underwent nuclear stress test 10/16 which showed normal perfusion.  She was hospitalized 12/20 with UTI.  She continued to follow-up with cardiology.  She was hospitalized 9/25 with shortness of breath and hypoxia.  She was found to be in atrial fibrillation with RVR.  She was placed on IV Cardizem  and converted to sinus rhythm.  Through shared decision making she was not restarted on anticoagulation.  She underwent right sided thoracentesis which produced 50 cc of transudative fluid.  Echocardiogram at that time showed an LVEF of 40-45%, severe LVH, G2 DD, normal RV function, moderate MR, and severe left atrial enlargement.  Due to her reduced EF her Cardizem  was discontinued and she was started on metoprolol  25 mg twice daily.  She was discharged on 08/23/2023.  She was seen in the clinic on 09/02/2023.  She was noted to be in atrial fibrillation with RVR.  She was also noted to have shortness of breath.  She was referred to the emergency department.  Her EKG in the emergency department showed atrial fibrillation with RVR 134 bpm.  Her blood pressure was noted to be 129/85.  Dr. Lavonne Prairie saw patient in the emergency department and felt rhythm was possibly atrial bigeminy versus MAT.  Daughter was at bedside.  She denied chest pain and was asymptomatic with heart rate.   She remained tachycardic.  We recommended metoprolol  every 6 hours.  Her TSH was noted to be normal.  She was started on midodrine  7.5 mg 3 times daily for hypotension.  0.0625 mg of digoxin  daily was added to her medication regimen.  She was treated for UTI and possible pneumonia.  Goal of care discussion was had.  Patient was made limited DNR.  She was seen by palliative care and outpatient palliative care follow-up was recommended.  She presents to the clinic today for follow-up.  She is unable to participate in exam today.  She is awake and unable to respond.  She presents with her son and daughter.  She is now at skilled nursing care and on palliative care.  Her EKG today shows normal sinus rhythm at 71 bpm.  Son and daughter note that they have also met with palliative care at the nursing facility.  They are planning to transition to hospice.  They report that their mom is not eating and is drinking poorly.  We reviewed the hospitalization and I encouraged him to reach out to us  with concerns or to have questions answered.  We will not schedule follow-up at this time.      Home Medications    Prior to Admission medications   Medication Sig Start Date End Date Taking? Authorizing Provider  acetaminophen  (TYLENOL ) 650 MG CR tablet Take 1 tablet (650 mg total) by mouth in the morning, at noon, and at bedtime. May also give 2 tablets every 6 hours as needed for pain 08/23/23   Rai, Hurman Maiden, MD  aspirin  EC 81 MG tablet Take 81 mg by mouth in the morning.    [provider]  atorvastatin  (LIPITOR) 20 MG tablet Take 20 mg by mouth in the morning.    [provider]  COMBIGAN  0.2-0.5 % ophthalmic solution Place 1 drop into both eyes in the morning and at bedtime. 03/14/23   [provider]  Dextran 70-Hypromellose 0.1-0.3 % SOLN Apply 2 drops to eye every 12 (twelve) hours as needed (dry, itchy, irritated eyes).    [provider]  digoxin  (LANOXIN ) 0.125 MG tablet  Take 0.5 tablets (0.0625 mg total) by mouth daily. 09/06/23 09/05/24  Gonfa, Taye T, MD  folic acid  (FOLVITE ) 1 MG tablet Take 1 mg by mouth in the morning.    [provider]  latanoprost  (XALATAN ) 0.005 % ophthalmic solution Place 1 drop into both eyes at bedtime. 03/10/23   [provider]  melatonin 5 MG TABS Take 1 tablet (5 mg total) by mouth at bedtime as needed. 08/23/23   Rai, Hurman Maiden, MD  Menthol , Topical Analgesic, (BIOFREEZE COOL THE PAIN) 4 % GEL Apply 1 Application topically 3 (three) times daily. Apply topically to neck    [provider]  metoprolol  tartrate (LOPRESSOR ) 25 MG tablet Take 1 tablet (25 mg total) by mouth every 6 (six) hours. 09/06/23   Gonfa, Taye T, MD  midodrine  (PROAMATINE ) 2.5 MG tablet Take  3 tablets (7.5 mg total) by mouth 3 (three) times daily with meals. 09/06/23   Gonfa, Taye T, MD  mirtazapine (REMERON) 7.5 MG tablet Take 7.5 mg by mouth at bedtime. 05/02/23   [provider]  Multiple Vitamin (MULTIVITAMIN WITH MINERALS) TABS tablet Take 1 tablet by mouth in the morning.    [provider]  nitroGLYCERIN  (NITROSTAT ) 0.4 MG SL tablet Place 1 tablet (0.4 mg total) under the tongue every 5 (five) minutes as needed for chest pain. 10/01/20 07/01/27  Lenise Quince, MD  NON FORMULARY Take 120 mLs by mouth in the morning, at noon, and at bedtime. Med pass    [provider]  Omega 3 1000 MG CAPS Take 1,000 mg by mouth in the morning.    [provider]  ondansetron  (ZOFRAN ) 4 MG tablet Take 4 mg by mouth every 8 (eight) hours as needed for nausea or vomiting.    [provider]  polyethylene glycol (MIRALAX  / GLYCOLAX ) 17 g packet Take 17 g by mouth in the morning.    [provider]  potassium chloride  (KLOR-CON ) 10 MEQ tablet Take 10 mEq by mouth at bedtime.    [provider]  senna-docusate (SENOKOT-S) 8.6-50 MG tablet Take 1 tablet by mouth 2 (two) times daily. 07/20/23 10/18/23   [place], Eric J, DO  torsemide  (DEMADEX ) 20 MG tablet Take 1 tablet (20 mg total) by mouth every other day. 09/08/23   Gonfa, Taye T, MD  Trolamine Salicylate (ASPERCREME EX) Apply 1 application  topically in the morning and at bedtime. Apply to both legs    [provider]  UNABLE TO FIND Take 120 mLs by mouth in the morning, at noon, and at bedtime. Med Name: [facility]    [provider]    Family History    Family History  Problem Relation Age of Onset   Heart disease Mother    Hypertension Mother    Kidney disease Sister    Mental illness Sister        suicide, depresion   Gout Brother    Hypertension Son    Cancer Maternal Grandmother    Cancer Sister    Bone cancer Sister    Cancer Sister    Cancer Sister        kidney   Cancer Daughter    Pancreatic cancer Daughter    Thyroid  disease Daughter    Thyroid  disease Daughter        thyroid  nodules   Hypertension Daughter    Irritable bowel syndrome Daughter    Cancer Daughter        thyroid    Cancer Brother    Prostate cancer Brother    She indicated that her mother is deceased. She indicated that her father is deceased. She indicated that all of her four sisters are deceased. She indicated that only one of her two brothers is alive. She indicated that her maternal grandmother is deceased. She indicated that her maternal grandfather is deceased. She indicated that her paternal grandmother is deceased. She indicated that her paternal grandfather is deceased. She indicated that two of her three daughters are alive. She indicated that her son is alive.  Social History    Social History   Socioeconomic History   Marital status: Widowed    Spouse name: Not on file   Number of children: 4   Years of education: Not on file   Highest education level: Not on file  Occupational History  Not on file  Tobacco Use   Smoking status: Never   Smokeless tobacco: Never  Vaping Use   Vaping status: Never Used   Substance and Sexual Activity   Alcohol  use: No   Drug use: No   Sexual activity: Not Currently    Partners: Male    Comment: no dietary restrictions, lives with 2 daughters  Other Topics Concern   Not on file  Social History Narrative   No dietary restrictions and lives with daughters.   Social Drivers of Corporate investment banker Strain: Low Risk  (01/29/2021)   Overall Financial Resource Strain (CARDIA)    Difficulty of Paying Living Expenses: Not hard at all  Food Insecurity: No Food Insecurity (09/02/2023)   Hunger Vital Sign    Worried About Running Out of Food in the Last Year: Never true    Ran Out of Food in the Last Year: Never true  Transportation Needs: No Transportation Needs (09/02/2023)   PRAPARE - Administrator, Civil Service (Medical): No    Lack of Transportation (Non-Medical): No  Physical Activity: Inactive (01/29/2021)   Exercise Vital Sign    Days of Exercise per Week: 0 days    Minutes of Exercise per Session: 0 min  Stress: No Stress Concern Present (01/29/2021)   [employer] of Occupational Health - Occupational Stress Questionnaire    Feeling of Stress : Not at all  Social Connections: Socially Isolated (09/02/2023)   Social Connection and Isolation Panel [NHANES]    Frequency of Communication with Friends and Family: More than three times a week    Frequency of Social Gatherings with Friends and Family: Twice a week    Attends Religious Services: Never    Database administrator or Organizations: No    Attends Banker Meetings: Never    Marital Status: Widowed  Intimate Partner Violence: Patient Unable To Answer (09/02/2023)   Humiliation, Afraid, Rape, and Kick questionnaire    Fear of Current or Ex-Partner: Patient unable to answer    Emotionally Abused: Patient unable to answer    Physically Abused: Patient unable to answer    Sexually Abused: Patient unable to answer     Review of Systems    General:  No  chills, fever, night sweats or weight changes.  Cardiovascular:  No chest pain, dyspnea on exertion, edema, orthopnea, palpitations, paroxysmal nocturnal dyspnea. Dermatological: No rash, lesions/masses Respiratory: No cough, dyspnea Urologic: No hematuria, dysuria Abdominal:   No nausea, vomiting, diarrhea, bright red blood per rectum, melena, or hematemesis Neurologic:  No visual changes, wkns, changes in mental status. All other systems reviewed and are otherwise negative except as noted above.  Physical Exam    VS:  BP (!) 0/0 (Nunez Size: Small) Comment: Unable to obtain-palliative care  Pulse 71   Resp 16  , BMI There is no height or weight on file to calculate BMI. GEN: Well nourished, well developed, in no acute distress. HEENT: normal. Neck: Supple, no JVD, carotid bruits, or masses. Cardiac: RRR, 3/6 systolic murmur heard along left sternal border, rubs, or gallops. No clubbing, cyanosis, edema.  Radials/DP/PT 2+ and equal bilaterally.  Respiratory:  Respirations regular and unlabored, clear to auscultation bilaterally. GI: Soft, nontender, nondistended, BS + x 4. MS: no deformity or atrophy. Skin: warm and dry, no rash. Neuro:  Strength and sensation are intact. Psych: Normal affect.  Accessory Clinical Findings    Recent Labs: 09/03/2023: ALT 12; B Natriuretic Peptide 449.9;  TSH 1.487 09/05/2023: BUN 11; Creatinine, Ser 0.82; Hemoglobin 10.0; Magnesium  1.8; Platelets 215; Potassium 4.1; Sodium 139   Recent Lipid Panel    Component Value Date/Time   CHOL 133 03/19/2021 1351   TRIG 56.0 03/19/2021 1351   HDL 72.70 03/19/2021 1351   CHOLHDL 2 03/19/2021 1351   VLDL 11.2 03/19/2021 1351   LDLCALC 49 03/19/2021 1351   LDLCALC 59 03/04/2020 1123         ECG personally reviewed by me today- EKG Interpretation Date/Time:  Friday Sep 30 2023 14:17:01 EDT Ventricular Rate:  71 PR Interval:  162 QRS Duration:  76 QT Interval:  388 QTC Calculation: 421 R  Axis:   -29  Text Interpretation: Normal sinus rhythm Low voltage QRS Cannot rule out Anterior infarct , age undetermined When compared with ECG of 04-Sep-2023 02:44, Sinus rhythm has replaced Atrial fibrillation Vent. rate has decreased BY  60 BPM Confirmed by Lawana Pray [id number]) on 09/30/2023 2:19:46 PM   Echocardiogram 08/21/2023  IMPRESSIONS     1. Left ventricular ejection fraction, by estimation, is 40 to 45%. The  left ventricle has mildly decreased function. The left ventricle  demonstrates global hypokinesis. There is severe left ventricular  hypertrophy. Left ventricular diastolic parameters   are consistent with Grade II diastolic dysfunction (pseudonormalization).   2. Right ventricular systolic function is normal. The right ventricular  size is normal.   3. Left atrial size was severely dilated.   4. Large pleural effusion in the left lateral region.   5. The mitral valve is normal in structure. Moderate mitral valve  regurgitation. No evidence of mitral stenosis. Moderate mitral annular  calcification.   6. The aortic valve is normal in structure. There is mild calcification  of the aortic valve. There is mild thickening of the aortic valve. Aortic  valve regurgitation is not visualized. Aortic valve sclerosis is present,  with no evidence of aortic valve  stenosis.   7. The inferior vena cava is normal in size with greater than 50%  respiratory variability, suggesting right atrial pressure of 3 mmHg.   8. Evidence of atrial level shunting detected by color flow Doppler.   FINDINGS   Left Ventricle: Left ventricular ejection fraction, by estimation, is 40  to 45%. The left ventricle has mildly decreased function. The left  ventricle demonstrates global hypokinesis. The left ventricular internal  cavity size was normal in size. There is   severe left ventricular hypertrophy. Left ventricular diastolic  parameters are consistent with Grade II diastolic dysfunction   (pseudonormalization).   Right Ventricle: The right ventricular size is normal. No increase in  right ventricular wall thickness. Right ventricular systolic function is  normal.   Left Atrium: Left atrial size was severely dilated.   Right Atrium: Right atrial size was normal in size.   Pericardium: There is no evidence of pericardial effusion.   Mitral Valve: The mitral valve is normal in structure. Moderate mitral  annular calcification. Moderate mitral valve regurgitation. No evidence of  mitral valve stenosis. MV peak gradient, 5.7 mmHg. The mean mitral valve  gradient is 2.0 mmHg.   Tricuspid Valve: The tricuspid valve is normal in structure. Tricuspid  valve regurgitation is mild . No evidence of tricuspid stenosis.   Aortic Valve: The aortic valve is normal in structure. There is mild  calcification of the aortic valve. There is mild thickening of the aortic  valve. Aortic valve regurgitation is not visualized. Aortic valve  sclerosis is present, with no  evidence of  aortic valve stenosis.   Pulmonic Valve: The pulmonic valve was normal in structure. Pulmonic valve  regurgitation is not visualized. No evidence of pulmonic stenosis.   Aorta: The aortic root is normal in size and structure.   Venous: The inferior vena cava is normal in size with greater than 50%  respiratory variability, suggesting right atrial pressure of 3 mmHg.   IAS/Shunts: Evidence of atrial level shunting detected by color flow  Doppler.   Additional Comments: There is a large pleural effusion in the left lateral  region.       Assessment & Plan   1.  Multifocal atrial tachycardia/paroxysmal atrial fibrillation-EKG today shows sinus rhythm 71 bpm.  He not a candidate for anticoagulation due to fall risk and age.  Seen in the emergency department after recent clinic visit.  She was admitted and discharged on 09/06/2023.  Her metoprolol  was increased and she was started on digoxin   0.0625. Continue metoprolol , digoxin , midodrine   Chronic systolic and diastolic CHF-no increased shortness of breath.  Feeling better today.  Echocardiogram 4/6 showed EF of 40-45%, G2 DD, severe LVH, moderate MR. Continue metoprolol , digoxin , torsemide  Continue with current palliative care/hospice.  Hypotension-unable to obtain today.  Will defer at this time.  Patient on palliative/hospice care Continue midodrine , digoxin    Moderate MR-echocardiogram 08/21/2023 showed moderate mitral annular calcification with moderate mitral valve regurgitation and no evidence of stenosis. Continue aspirin , atorvastatin    Elevated troponin, demand ischemia-felt to be in the setting of elevated heart rate and atrial fibrillation. Continue metoprolol , digoxin , torsemide  No plans for ischemic evaluation  Community-acquired pneumonia, UTI-received IV antibiotics and completed p.o. antibiotics.  Denies fever and chills. Following with PCP  Disposition: Will not schedule follow-up at this time-patient palliative/hospice care.   Chet Cota. Lysa Livengood NP-C     09/30/2023, 2:48 PM  [facility] [address] Office [phone] Fax [phone]    I spent 13 minutes examining this patient, reviewing medications, and using patient centered shared decision making involving their cardiac care.   I spent  20 minutes reviewing past medical history,  medications, and prior cardiac tests.

## 2023-09-30 ENCOUNTER — Ambulatory Visit: Attending: General Practice | Admitting: General Practice

## 2023-09-30 ENCOUNTER — Ambulatory Visit: Admitting: Cardiology

## 2023-09-30 ENCOUNTER — Encounter: Payer: Self-pay | Admitting: General Practice

## 2023-09-30 VITALS — BP 0/0 | HR 71 | Resp 16

## 2023-09-30 DIAGNOSIS — I480 Paroxysmal atrial fibrillation: Secondary | ICD-10-CM | POA: Diagnosis present

## 2023-09-30 DIAGNOSIS — R7989 Other specified abnormal findings of blood chemistry: Secondary | ICD-10-CM | POA: Insufficient documentation

## 2023-09-30 DIAGNOSIS — J189 Pneumonia, unspecified organism: Secondary | ICD-10-CM | POA: Diagnosis present

## 2023-09-30 DIAGNOSIS — I959 Hypotension, unspecified: Secondary | ICD-10-CM | POA: Insufficient documentation

## 2023-09-30 DIAGNOSIS — I340 Nonrheumatic mitral (valve) insufficiency: Secondary | ICD-10-CM | POA: Insufficient documentation

## 2023-09-30 DIAGNOSIS — I5042 Chronic combined systolic (congestive) and diastolic (congestive) heart failure: Secondary | ICD-10-CM | POA: Diagnosis present

## 2023-09-30 NOTE — Patient Instructions (Signed)
Call us with any questions or concerns.

## 2023-11-15 DEATH — deceased
